# Patient Record
Sex: Male | Born: 1940 | Race: White | Hispanic: No | Marital: Married | Smoking: Never smoker
Health system: Southern US, Community
[De-identification: ages and names within clinical notes are randomized; demographics above are authoritative.]

## PROBLEM LIST (undated history)

## (undated) DIAGNOSIS — D732 Chronic congestive splenomegaly: Secondary | ICD-10-CM

## (undated) DIAGNOSIS — D689 Coagulation defect, unspecified: Secondary | ICD-10-CM

## (undated) DIAGNOSIS — I340 Nonrheumatic mitral (valve) insufficiency: Secondary | ICD-10-CM

## (undated) DIAGNOSIS — Z9289 Personal history of other medical treatment: Secondary | ICD-10-CM

## (undated) DIAGNOSIS — D61818 Other pancytopenia: Secondary | ICD-10-CM

## (undated) DIAGNOSIS — E119 Type 2 diabetes mellitus without complications: Secondary | ICD-10-CM

## (undated) DIAGNOSIS — K7460 Unspecified cirrhosis of liver: Secondary | ICD-10-CM

## (undated) DIAGNOSIS — I6529 Occlusion and stenosis of unspecified carotid artery: Secondary | ICD-10-CM

## (undated) DIAGNOSIS — E785 Hyperlipidemia, unspecified: Secondary | ICD-10-CM

## (undated) DIAGNOSIS — I8500 Esophageal varices without bleeding: Secondary | ICD-10-CM

## (undated) DIAGNOSIS — D693 Immune thrombocytopenic purpura: Secondary | ICD-10-CM

## (undated) DIAGNOSIS — I82409 Acute embolism and thrombosis of unspecified deep veins of unspecified lower extremity: Secondary | ICD-10-CM

## (undated) DIAGNOSIS — R161 Splenomegaly, not elsewhere classified: Secondary | ICD-10-CM

## (undated) DIAGNOSIS — L02419 Cutaneous abscess of limb, unspecified: Secondary | ICD-10-CM

## (undated) DIAGNOSIS — I639 Cerebral infarction, unspecified: Secondary | ICD-10-CM

## (undated) DIAGNOSIS — M1990 Unspecified osteoarthritis, unspecified site: Secondary | ICD-10-CM

## (undated) DIAGNOSIS — K635 Polyp of colon: Secondary | ICD-10-CM

## (undated) DIAGNOSIS — D649 Anemia, unspecified: Secondary | ICD-10-CM

## (undated) DIAGNOSIS — D72819 Decreased white blood cell count, unspecified: Secondary | ICD-10-CM

## (undated) DIAGNOSIS — I10 Essential (primary) hypertension: Secondary | ICD-10-CM

## (undated) DIAGNOSIS — L03119 Cellulitis of unspecified part of limb: Secondary | ICD-10-CM

## (undated) DIAGNOSIS — I81 Portal vein thrombosis: Secondary | ICD-10-CM

## (undated) HISTORY — DX: Immune thrombocytopenic purpura: D69.3

## (undated) HISTORY — DX: Unspecified osteoarthritis, unspecified site: M19.90

## (undated) HISTORY — DX: Esophageal varices without bleeding: I85.00

## (undated) HISTORY — DX: Splenomegaly, not elsewhere classified: R16.1

## (undated) HISTORY — DX: Occlusion and stenosis of unspecified carotid artery: I65.29

## (undated) HISTORY — DX: Coagulation defect, unspecified: D68.9

## (undated) HISTORY — DX: Portal vein thrombosis: I81

## (undated) HISTORY — DX: Anemia, unspecified: D64.9

## (undated) HISTORY — PX: COLONOSCOPY: SHX174

## (undated) HISTORY — DX: Nonrheumatic mitral (valve) insufficiency: I34.0

## (undated) HISTORY — DX: Hyperlipidemia, unspecified: E78.5

## (undated) HISTORY — DX: Chronic congestive splenomegaly: D73.2

## (undated) HISTORY — DX: Cerebral infarction, unspecified: I63.9

## (undated) HISTORY — DX: Other pancytopenia: D61.818

## (undated) HISTORY — DX: Decreased white blood cell count, unspecified: D72.819

## (undated) HISTORY — DX: Polyp of colon: K63.5

---

## 1994-02-18 DIAGNOSIS — K55069 Acute infarction of intestine, part and extent unspecified: Secondary | ICD-10-CM

## 1994-02-18 HISTORY — DX: Acute infarction of intestine, part and extent unspecified: K55.069

## 1996-02-19 DIAGNOSIS — Z9289 Personal history of other medical treatment: Secondary | ICD-10-CM

## 1996-02-19 HISTORY — DX: Personal history of other medical treatment: Z92.89

## 1999-08-23 ENCOUNTER — Inpatient Hospital Stay (HOSPITAL_COMMUNITY): Admission: AD | Admit: 1999-08-23 | Discharge: 1999-08-28 | Payer: Self-pay | Admitting: Internal Medicine

## 1999-08-23 ENCOUNTER — Encounter: Payer: Self-pay | Admitting: Internal Medicine

## 1999-08-24 ENCOUNTER — Encounter: Payer: Self-pay | Admitting: Internal Medicine

## 2006-08-14 ENCOUNTER — Encounter: Admission: RE | Admit: 2006-08-14 | Discharge: 2006-09-03 | Payer: Self-pay | Admitting: Internal Medicine

## 2006-10-01 ENCOUNTER — Encounter: Admission: RE | Admit: 2006-10-01 | Discharge: 2006-10-01 | Payer: Self-pay

## 2006-11-05 ENCOUNTER — Encounter: Admission: RE | Admit: 2006-11-05 | Discharge: 2006-11-05 | Payer: Self-pay | Admitting: Internal Medicine

## 2006-11-12 ENCOUNTER — Encounter: Admission: RE | Admit: 2006-11-12 | Discharge: 2006-11-12 | Payer: Self-pay | Admitting: Internal Medicine

## 2006-11-19 ENCOUNTER — Ambulatory Visit (HOSPITAL_COMMUNITY): Admission: RE | Admit: 2006-11-19 | Discharge: 2006-11-19 | Payer: Self-pay | Admitting: Internal Medicine

## 2006-11-19 ENCOUNTER — Encounter (INDEPENDENT_AMBULATORY_CARE_PROVIDER_SITE_OTHER): Payer: Self-pay | Admitting: Internal Medicine

## 2006-11-25 ENCOUNTER — Encounter: Admission: RE | Admit: 2006-11-25 | Discharge: 2006-11-25 | Payer: Self-pay | Admitting: Internal Medicine

## 2006-12-11 ENCOUNTER — Ambulatory Visit: Payer: Self-pay | Admitting: *Deleted

## 2007-06-25 ENCOUNTER — Ambulatory Visit: Payer: Self-pay | Admitting: *Deleted

## 2007-12-31 ENCOUNTER — Ambulatory Visit: Payer: Self-pay | Admitting: *Deleted

## 2008-06-10 IMAGING — RF DG UGI W/ HIGH DENSITY W/KUB
18 of 22 series · 18 of 24 positions shown · non-contrast
Comparison: none

CLINICAL DATA: Dysphasia

UPPER GI SERIES W/ HIGH-DENSITY BARIUM AND KUB:
TECHNIQUE: After obtaining a scout radiograph, a double-contrast upper GI
series was performed using both high-density and thin barium.

[Series 1: run · 1 of 10 slices shown (1 of 17)]
[im 1/10]
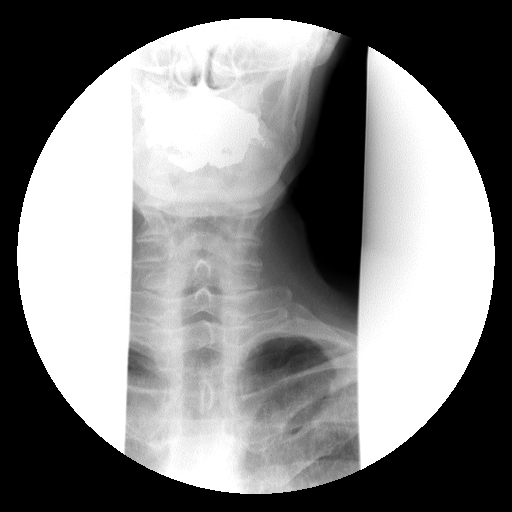

[Series 2: run · 1 of 9 slices shown (2 of 17)]
[im 1/9]
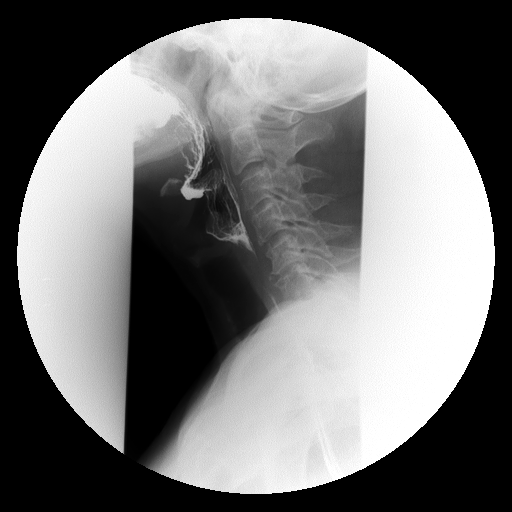

[Series 3: run · 1 of 4 slices shown (3 of 17)]
[im 1/4]
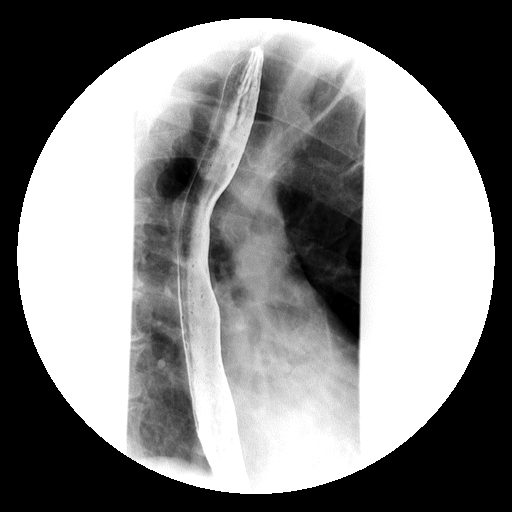

[Series 4: run · 1 of 9 slices shown (4 of 17)]
[im 1/9]
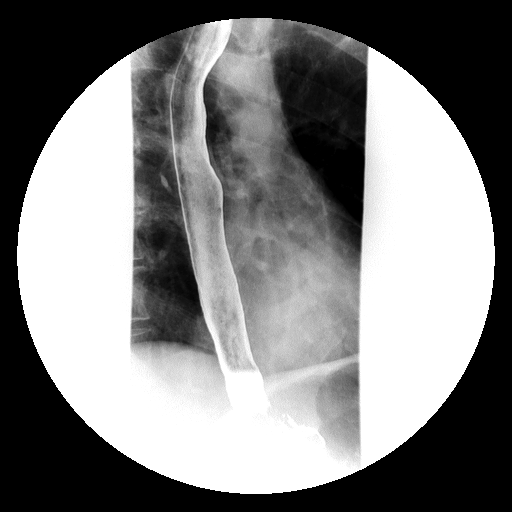

[Series 5: run · 1 of 1 slices shown (5 of 17)]
[im 1/1]
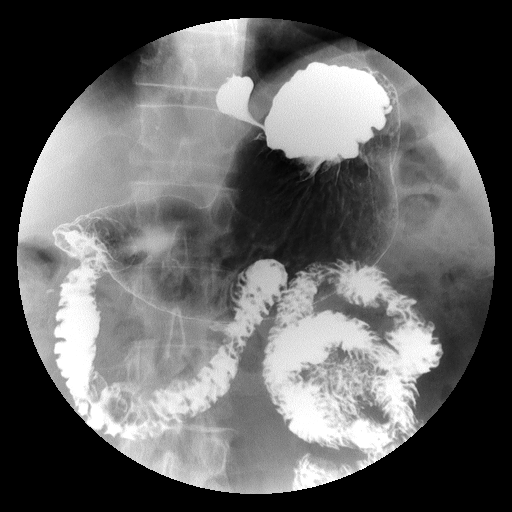

[Series 6: run · 1 of 1 slices shown (6 of 17)]
[im 1/1]
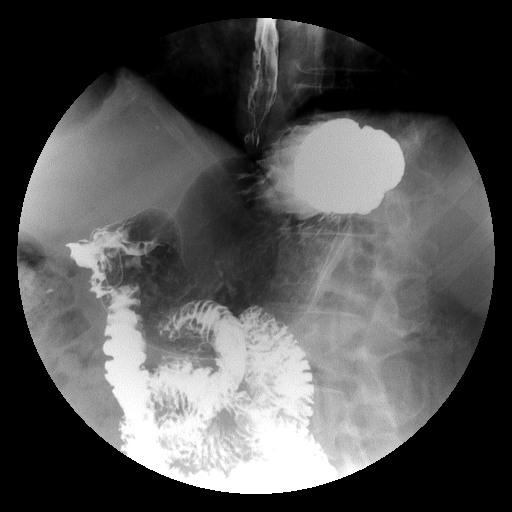

[Series 7: run · 1 of 1 slices shown (7 of 17)]
[im 1/1]
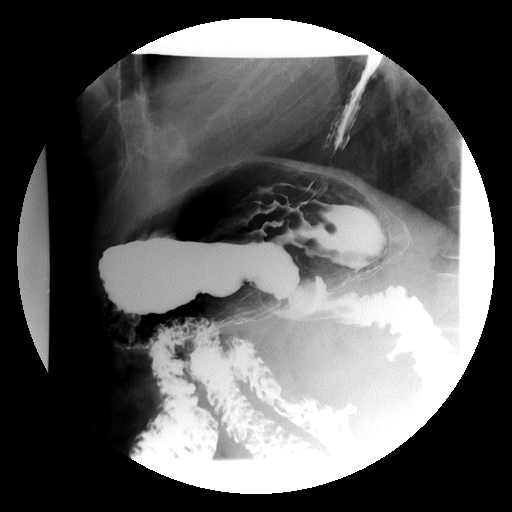

[Series 9: run · 1 of 1 slices shown (8 of 17)]
[im 1/1]
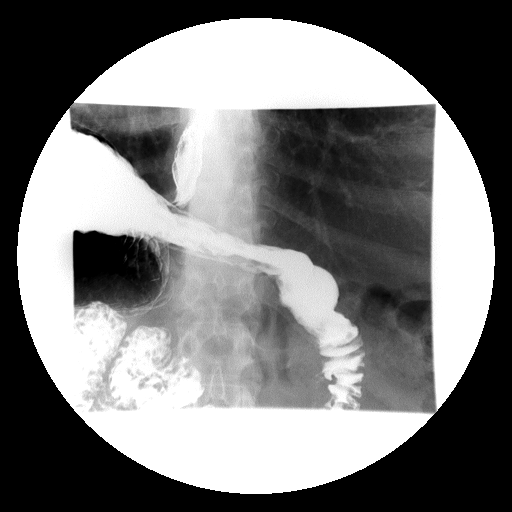

[Series 10: run · 1 of 1 slices shown (9 of 17)]
[im 1/1]
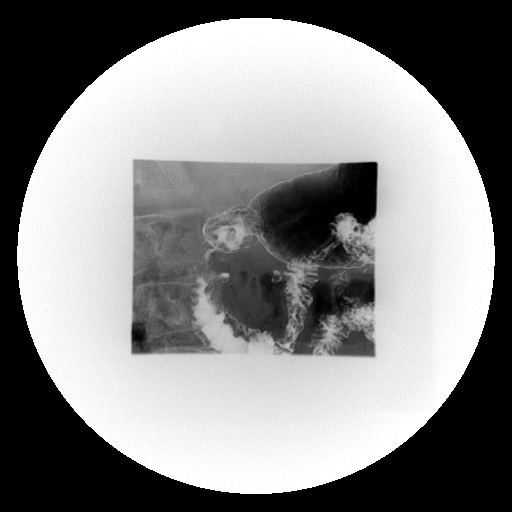

[Series 11: run · 1 of 1 slices shown (10 of 17)]
[im 1/1]
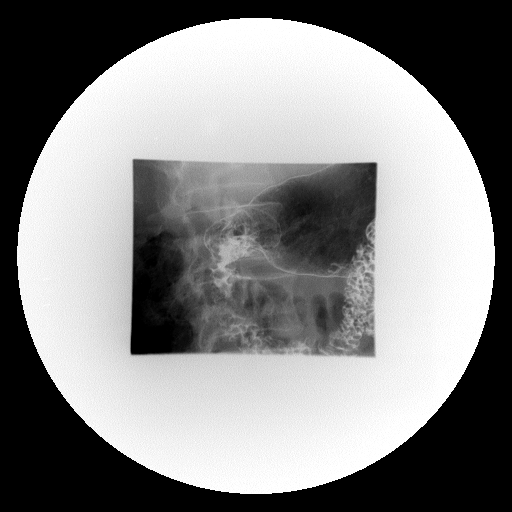

[Series 13: run · 1 of 1 slices shown (11 of 17)]
[im 1/1]
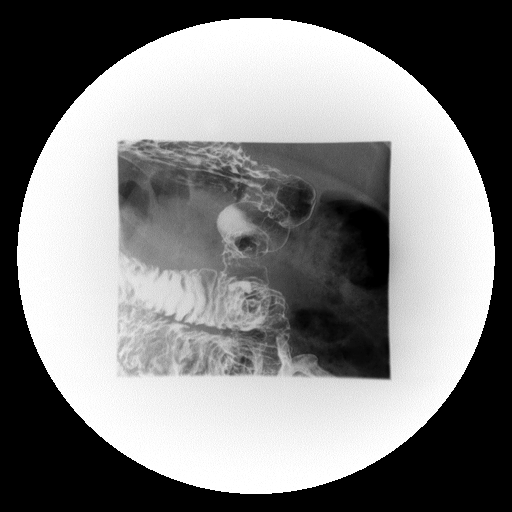

[Series 14: run · 1 of 1 slices shown (12 of 17)]
[im 1/1]
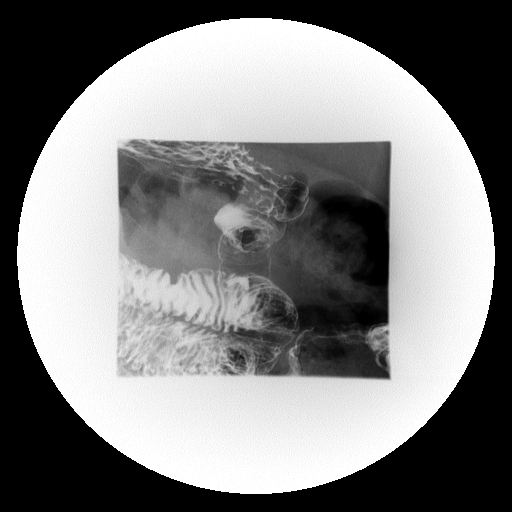

[Series 15: run · 1 of 1 slices shown (13 of 17)]
[im 1/1]
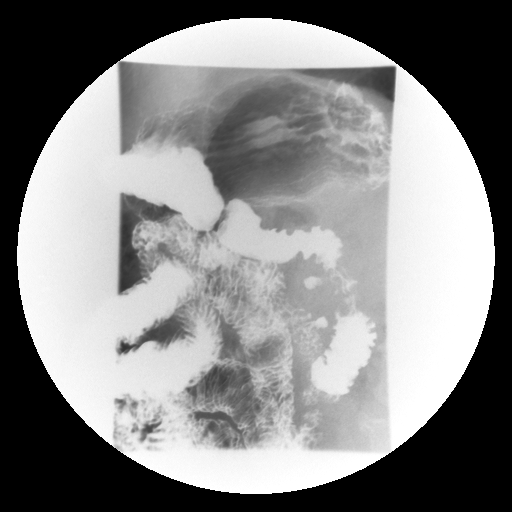

[Series 17: run · 1 of 1 slices shown (14 of 17)]
[im 1/1]
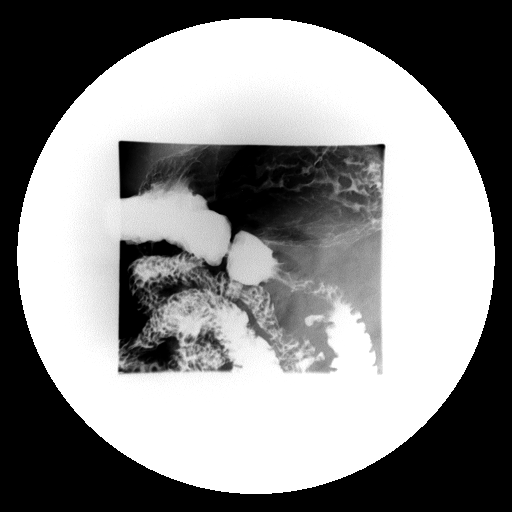

[Series 18: run · 1 of 1 slices shown (15 of 17)]
[im 1/1]
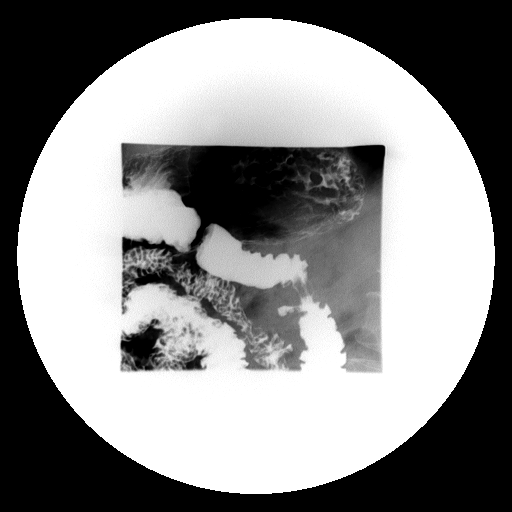

[Series 19: run · 1 of 1 slices shown (16 of 17)]
[im 1/1]
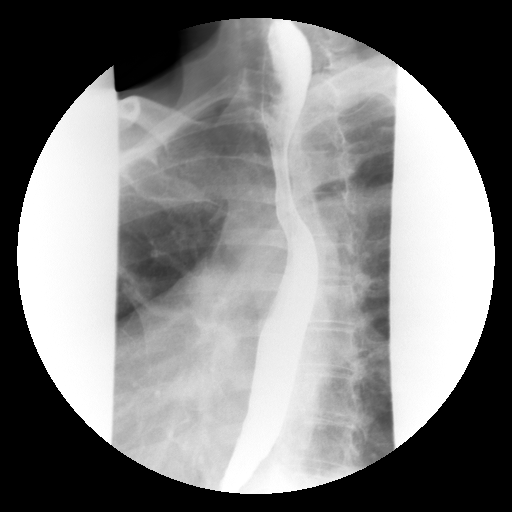

[Series 21: run · 1 of 1 slices shown (17 of 17)]
[im 1/1]
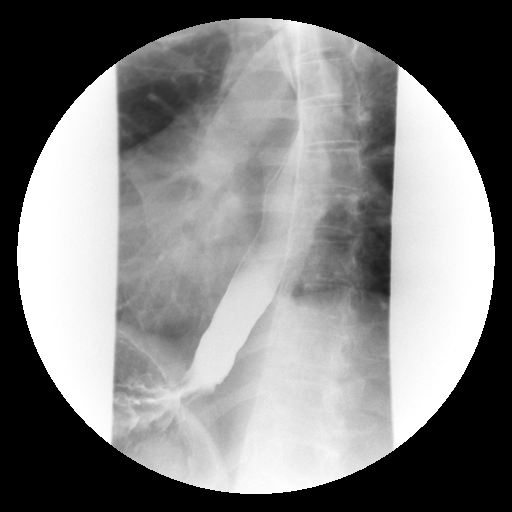

[Series 1001: view not recorded · 0.20mm/px · 1 of 1 slices shown]
[im 1/1]
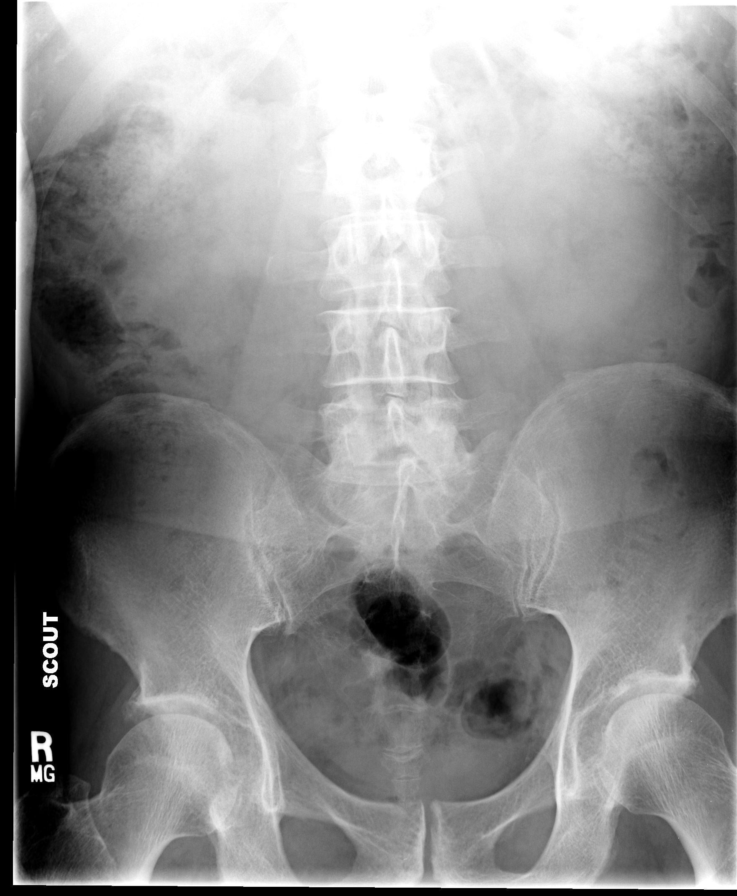

[18 of 24 positions shown; findings below may reference images not displayed]

FINDINGS: Preprocedure scout KUB shows a nonspecific bowel gas pattern with no
acute or significant findings.

Frontal and lateral views of the hypopharynx while swallowing are normal.
Double-contrast imaging of the esophagus shows no evidence for mucosal
ulceration, stricture, mass-effect, or diverticulum. Primary peristalsis is
disrupted on numerous swallows, consistent with nonspecific esophageal motility
disorder.

Double contrast imaging of the stomach is unremarkable.  The pylorus and
duodenal bulb are normal.  Duodenal C-loop is in the expected anatomic location.

The patient was given a 13 mm barium tablet at the end of the exam which passes
readily into the stomach when taken with water.
IMPRESSION: Nonspecific esophageal motility disorder. Otherwise unremarkable exam.

## 2008-06-29 ENCOUNTER — Ambulatory Visit (HOSPITAL_COMMUNITY): Admission: RE | Admit: 2008-06-29 | Discharge: 2008-06-29 | Payer: Self-pay | Admitting: *Deleted

## 2008-06-29 ENCOUNTER — Encounter (INDEPENDENT_AMBULATORY_CARE_PROVIDER_SITE_OTHER): Payer: Self-pay | Admitting: *Deleted

## 2008-06-30 ENCOUNTER — Ambulatory Visit: Payer: Self-pay | Admitting: *Deleted

## 2008-11-24 ENCOUNTER — Encounter: Admission: RE | Admit: 2008-11-24 | Discharge: 2008-11-24 | Payer: Self-pay | Admitting: Internal Medicine

## 2008-12-23 ENCOUNTER — Ambulatory Visit: Payer: Self-pay | Admitting: Vascular Surgery

## 2009-06-22 ENCOUNTER — Ambulatory Visit: Payer: Self-pay | Admitting: Vascular Surgery

## 2010-02-08 ENCOUNTER — Ambulatory Visit: Payer: Self-pay | Admitting: Vascular Surgery

## 2010-07-03 NOTE — Procedures (Signed)
CAROTID DUPLEX EXAM   INDICATION:  Follow up carotid artery disease.   HISTORY:  Diabetes:  Borderline.  Cardiac:  No.  Hypertension:  Yes.  Smoking:  No.  Previous Surgery:  No.  CV History:  TIA in 2008.  Amaurosis Fugax No, Paresthesias No, Hemiparesis No.                                       RIGHT             LEFT  Brachial systolic pressure:         128               130  Brachial Doppler waveforms:         Biphasic          Biphasic  Vertebral direction of flow:        Antegrade         Antegrade  DUPLEX VELOCITIES (cm/sec)  CCA peak systolic                   109               343  ECA peak systolic                   130               568  ICA peak systolic                   230               616  ICA end diastolic                   58                35  PLAQUE MORPHOLOGY:                  Calcified         Calcified  PLAQUE AMOUNT:                      Moderate/severe   Mild  PLAQUE LOCATION:                    ICA/bifurcation   Bifurcation/ICA   IMPRESSION:  1. Right internal carotid artery shows evidence of 60-79% stenosis.  2. Left internal carotid artery shows evidence of 1-39% stenosis.  3. No significant changes from previous study.       ___________________________________________  P. Drucie Opitz, M.D.   AS/MEDQ  D:  12/31/2007  T:  12/31/2007  Job:  [id number]

## 2010-07-03 NOTE — Procedures (Signed)
CAROTID DUPLEX EXAM   INDICATION:  Followup carotid artery disease.   HISTORY:  Diabetes:  No.  Cardiac:  No.  Hypertension:  Yes.  Smoking:  No.  Previous Surgery:  No.  CV History:  TIA in 2008, currently asymptomatic.  Amaurosis Fugax No, Paresthesias No, Hemiparesis No                                       RIGHT             LEFT  Brachial systolic pressure:         158               152  Brachial Doppler waveforms:         WNL               WNL  Vertebral direction of flow:        Antegrade         Antegrade  DUPLEX VELOCITIES (cm/sec)  CCA peak systolic                   120               878  ECA peak systolic                   202               676  ICA peak systolic                   239               720  ICA end diastolic                   55                42  PLAQUE MORPHOLOGY:                  Heterogeneous     Heterogeneous  PLAQUE AMOUNT:                      Moderate          Mild  PLAQUE LOCATION:                    ICA and ECA       ICA   IMPRESSION:  60% to 79% stenosis of the right internal carotid artery.  40% to 59% stenosis in the left internal carotid artery secondary to  vessel tortuosity.  Bilateral intimal thickening within the common  carotid arteries.  Bilateral external carotid artery stenosis.  Study  stable compared to previous.     ___________________________________________  Judeth Cornfield. Scot Dock, M.D.   OD/MEDQ  D:  03/05/2010  T:  03/05/2010  Job:  [id number]

## 2010-07-03 NOTE — Op Note (Signed)
NAME:  [name], [name]              ACCOUNT NO.:  [id number]   MEDICAL RECORD NO.:  [id number]          PATIENT TYPE:  AMB   LOCATION:  ENDO                         FACILITY:  [facility]   PHYSICIAN:  Waverly Ferrari, M.D.    DATE OF BIRTH:  Apr 16, 1940   DATE OF PROCEDURE:  DATE OF DISCHARGE:                               OPERATIVE REPORT   PROCEDURE:  Colonoscopy.   INDICATIONS:  Colon cancer screening.   ANESTHESIA:  Fentanyl 75 mcg and Versed 8 mg.   PROCEDURE:  With the patient mildly sedated in the left lateral  decubitus position the Pentax videoscopic pediatric colonoscope was  inserted in the rectum after a normal rectal exam and passed under  direct vision with pressure applied to reach the cecum identified by  ileocecal valve and base of cecum.  In the cecum was multiple little  erosions that were photographed and biopsies were taken.  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  Some were photographed.  There was also a  small polyp just one fold removed from the ileocecal valve.  This was  photographed and it was removed using snare cautery technique at setting  of 20/150 blended current.  Unfortunately, the polyp was lost into  brownish material and it was a small polyp and was not found.  Therefore, after searching I elected to just withdraw the colonoscope  taking circumferential views of the remaining colonic mucosa stopping  only then in the rectum which appeared normal on direct and showed  hemorrhoids on retroflexed view.  The endoscope was straightened and  withdrawn.  The patient's vital signs and pulse oximeter remained  stable.  The patient tolerated the procedure well without apparent  complications.   FINDINGS:  Internal hemorrhoids, multiple arteriovenous malformations of  right colon, aphthous ulceration of cecum, especially in the cecal cap  and small polyp of the cecum removed.   PLAN:  Await  biopsy report.  The patient will call me for results and  follow-up with me as an outpatient.           ______________________________  Waverly Ferrari, M.D.     GMO/MEDQ  D:  06/29/2008  T:  06/29/2008  Job:  [id number]

## 2010-07-03 NOTE — Procedures (Signed)
CAROTID DUPLEX EXAM   INDICATION:  Followup of known carotid artery disease.  The patient is  asymptomatic.   HISTORY:  Diabetes:  Borderline.  Cardiac:  No.  Hypertension:  Yes.  Smoking:  No.  Previous Surgery:  No.  CV History:  TIA in 2008.  Amaurosis Fugax No, Paresthesias No, Hemiparesis No                                       RIGHT             LEFT  Brachial systolic pressure:         142               138  Brachial Doppler waveforms:         WNL               WNL  Vertebral direction of flow:        Antegrade         Antegrade  DUPLEX VELOCITIES (cm/sec)  CCA peak systolic                   105               96  ECA peak systolic                   146               259  ICA peak systolic                   271               83  ICA end diastolic                   76                29  PLAQUE MORPHOLOGY:                  Calcific with shadowing             Calcific  PLAQUE AMOUNT:                      Moderate - severe Mild  PLAQUE LOCATION:                    Bifurcation, ICA  ICA   IMPRESSION:  1. Right 60-79% ICA stenosis.  2. Left 1-39% ICA stenosis.  3. Patent ECAs.  4. Bilateral antegrade flow in vertebral arteries.   ___________________________________________  P. Drucie Opitz, M.D.   PB/MEDQ  D:  06/25/2007  T:  06/26/2007  Job:  [id number]

## 2010-07-03 NOTE — Procedures (Signed)
CAROTID DUPLEX EXAM   INDICATION:  Followup of known carotid artery disease.   HISTORY:  Diabetes:  Borderline.  Cardiac:  No.  Hypertension:  No.  Smoking:  No.  Previous Surgery:  No.  CV History:  TIA in 2008.  Amaurosis Fugax No, Paresthesias No, Hemiparesis No                                       RIGHT             LEFT  Brachial systolic pressure:         134               150  Brachial Doppler waveforms:         WNL               WNL  Vertebral direction of flow:        Antegrade         Antegrade  DUPLEX VELOCITIES (cm/sec)  CCA peak systolic                   155               056  ECA peak systolic                   180               979  ICA peak systolic                   285               480  ICA end diastolic                   80                22  PLAQUE MORPHOLOGY:                  Calcified         Calcified  PLAQUE AMOUNT:                      Moderate to severe                  Mild  PLAQUE LOCATION:                    Bif/ICA           Bif/ICA   IMPRESSION:  1. High end 60-79% right internal carotid artery stenosis.  2. 40-59% left internal carotid artery stenosis.       ___________________________________________  P. Drucie Opitz, M.D.   AC/MEDQ  D:  06/30/2008  T:  06/30/2008  Job:  [id number]

## 2010-07-03 NOTE — Assessment & Plan Note (Signed)
OFFICE VISIT   [name], [name]  DOB:  [birth year]                                       06/22/2009  UXLKG#:[id number]   I saw the patient in the office today for continued followup of his  carotid disease.  This patient had previously been followed by Dr.  Amedeo [name].  This is a pleasant 70 year old right-handed gentleman who has  had a moderate right carotid stenosis.  On his most recent followup  duplex scan in November 2010 he had a 60%-79% right carotid stenosis  with a 40%-59% left carotid stenosis.  He is asymptomatic.  He comes in  for a 42-monthfollowup visit.   He had a previous left occipital lobe infarct based on Dr. HDoristine Locksnote  from October 2008.  Since that time period, he has had no further  history of stroke, TIAs, expressive or receptive aphasia or amaurosis  fugax.   PAST MEDICAL HISTORY:  Significant for adult-onset diabetes.  He does  not require insulin.  He denies any history of hypertension, history of  previous myocardial infarction, history of congestive heart failure,  history of COPD.  He does have a history of hypercholesterolemia, which  is well-controlled on his Lipitor.  His diabetes has also been under  good control.   FAMILY HISTORY:  There is no history of premature cardiovascular  disease.   SOCIAL HISTORY:  He is married.  He has 2 children.  He des not use  tobacco.   REVIEW OF SYSTEMS:  CARDIOVASCULAR:  He had no chest pain, chest  pressure, palpitations or arrhythmias.  He has had no claudication, rest  pain or nonhealing ulcers.  He has had no history of DVT or phlebitis.  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  PULMONARY:  He has had no productive cough, bronchitis, asthma or  wheezing.   PHYSICAL EXAMINATION:  This is a pleasant 70year old gentleman who  appears his stated age.  Blood pressure is 171/68, heart rate is 70,  saturation is 100%.   HEENT:  Unremarkable.  Lungs are clear bilaterally  to auscultation without rales, rhonchi or wheezing.  On cardiovascular  exam, he has a right carotid bruit.  He has a regular rate and rhythm  without murmur.  He has palpable femoral pulses and well-perfused feet  without ischemic ulcers.  Abdomen is soft and nontender with normal-  pitched bowel sounds.  Neurologic exam:  He has no focal weakness or  paresthesias.  Skin:  There are no ulcers or rashes.   I have independently interpreted his carotid duplex scan, which shows a  60%-79% right carotid stenosis with no significant stenosis on the left.  The velocities are actually lower compared to the study 6 months ago.  I  have explained we typically would not consider right carotid  endarterectomy unless the stenosis progressed to greater than 80% range  or he developed new neurologic symptoms.  I have ordered a followup  duplex scan in 6 months and I will see him back at that time.  He knows  to call sooner if he has problems.  In the meantime he knows to continue  taking his aspirin.     CJudeth [name] DScot [name] M.D.  Electronically Signed   CSD/MEDQ  D:  06/22/2009  T:  06/23/2009  Job:  [id number]

## 2010-07-03 NOTE — Procedures (Signed)
CAROTID DUPLEX EXAM   INDICATION:  Carotid disease.   HISTORY:  Diabetes:  No.  Cardiac:  No.  Hypertension:  Yes.  Smoking:  No.  Previous Surgery:  No.  CV History:  TIA in 2008, currently asymptomatic.  Amaurosis Fugax No, Paresthesias No, Hemiparesis No                                       RIGHT             LEFT  Brachial systolic pressure:         156               154  Brachial Doppler waveforms:         Normal            Normal  Vertebral direction of flow:        Antegrade         Antegrade  DUPLEX VELOCITIES (cm/sec)  CCA peak systolic                   97                387  ECA peak systolic                   171               564  ICA peak systolic                   264               95  ICA end diastolic                   58                25  PLAQUE MORPHOLOGY:                  Mixed             Mixed  PLAQUE AMOUNT:                      Moderate          Mild  PLAQUE LOCATION:                    ICA               ICA   IMPRESSION:  1. 60%-79% stenosis of the right internal carotid artery.  2. 1%-39% stenosis of the left internal carotid artery.  3. No significant change noted when compared to the previous exam on      12/23/2008.   ___________________________________________  Judeth Cornfield. Scot Dock, M.D.   CH/MEDQ  D:  06/23/2009  T:  06/24/2009  Job:  [id number]

## 2010-07-03 NOTE — Consult Note (Signed)
VASCULAR SURGERY CONSULTATION   [name], [name]  DOB:  [birth year]                                       12/11/2006  BTDHR#:[id number]   REASON FOR CONSULTATION:  Left occipital lobe infarction (CVA).   HISTORY:  Patient is a 70 year old gentleman referred for evaluation of  carotid artery disease and a history of stroke.   He describes an initial onset of unusual symptoms in May or June of this  year with diffuse joint discomfort.  This was then followed in August by  the development of an unsteadiness of gait, dry mouth, and loss of  taste.  He lost about 40 pounds of weight over 3-4 months.  He was  having difficulty eating due to his symptoms of dry mouth and poor  taste.  He noted general blurring of his vision.  His dizziness has  improved over the past 3-4 weeks.  Joint discomfort resolved.  He has  gained back about 12 pounds of weight.  His vision is still somewhat  blurred in both eyes.  Denies monocular blindness.  No focal sensory,  motor, or visual deficit.  Denies speech problems.   CT scan performed in mid September revealed hypodensity of the left  occipital lobe, suspicious for acute infarct.  Infarection was verified  by MRI.  Aspirin therapy was initiated.   Carotid Doppler evaluation, along with 2D echo, were carried out.  Carotid Doppler reveals a 60-79% right ICA stenosis.  Left ICA reveals  minimal disease.   A 2D echo revealed good LV function, mild-to-moderate mitral valve  thickening, and mild mitral regurgitation.  No significant cardiac  source of emboli was identified.  Patent foramen ovale could not be  excluded.   Patient denies any clumsiness, poor coordination, stumbling, or syncope.   PAST MEDICAL HISTORY:  1. Hyperlipidemia.  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  4. Elevated liver function studies.  5. Hypertension.  6. Duodenal  diverticulum.  7. Type 2 diabetes.  8. Polymyalgia rheumatica.   MEDICATIONS:  1. Metformin/HCL 500 mg t.i.d.  2. Glipizide 5 mg t.i.d.  3. Lipitor 10 mg daily.  4. Omega fish oil 1000 mg b.i.d.  5. Aspirin 81 mg daily.  6. Vitamin C 1000 mg daily.  7. Vitamin D3 1000 mg daily.   ALLERGIES:  None known.   FAMILY HISTORY:  Father died at age 6.  He had a history of pacemaker  insertion.  Mother died at age 43 with a history of Alzheimer's disease.  He has two brothers who are living.  One sister deceased at age 41 with  a history of cancer and diabetes.  No family history of stroke or  coronary artery disease.   SOCIAL HISTORY:  The patient is married with two grown children.  He is  retired.  Does not currently smoke or use tobacco products.  Denies  alcohol use.   REVIEW OF SYSTEMS:  Recent weight loss is noted in history.  Appetite is  now better.  No fevers or chills.  No cardiac or pulmonary complaints.  He did have some trouble swallowing around the time of his stroke, but  this is now improved.  No change in bowel habits.  No dysuria or  frequency.  History of mesenteric thrombosis, as noted.  Denies recent  joint discomfort.  No depression.  No change in vision or hearing.  No  bleeding problems.  Chronic thrombocytopenia present.   PHYSICAL EXAMINATION:  A 70 year old gentleman alert and oriented.  No  distress.  No cyanosis, pallor, or jaundice.  Vital signs:  BP 138/60.  Pulse is 75 per minute.  Respirations 18 per minute.  HEENT:  Extraocular movements are intact.  Pupils are equal and reactive.  Normal hearing.  Mouth and throat are clear.  Neck:  Supple.  No  thyromegaly or adenopathy.  Cardiovascular:  No carotid bruits.  Heart  sounds are normal without murmurs.  No extra sounds, gallops, or rubs.  Femoral pulses 2+ bilaterally.  Respiratory:  Equal air entry  bilaterally.  No rales or rhonchi.  Normal percussion.  GI:  Normal  bowel sounds.  No abdominal  bruits.  Abdomen:  Nontender.  No masses or  organomegaly.  Neurologic:  Cranial nerves intact.  Strength equal  bilaterally.  Normal reflexes at 1+ bilaterally.  Musculoskeletal:  No  joint deformity.  Full range of motion of the joints.   IMPRESSION:  1. Left occipital lobe cerebrovascular accident.  2. Asymptomatic moderate right internal carotid artery stenosis.  3. Hyperlipidemia.  4. Thrombocytopenia.  5. Hypertension.  6. Type 2 diabetes.  7. Polymyalgia rheumatica.   MEDICAL DECISION MAKING:  Patient has a history of nondisabling left  occipital stroke, embolic source.  Workup for this is negative at this  time, including no significant left carotid disease or potential cardiac  source of emboli identified.  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  Recommend at this time continued  antiplatelet agent with low dose aspirin.  Follow up in six months with  a carotid Doppler evaluation.   Dorothea Glassman, M.D.  Electronically Signed  PGH/MEDQ  D:  12/11/2006  T:  12/13/2006  Job:  [id number]   cc:   Arlyss Repress, MD

## 2010-07-03 NOTE — Procedures (Signed)
CAROTID DUPLEX EXAM   INDICATION:  Followup of carotid disease.   HISTORY:  Diabetes:  No.  Cardiac:  No.  Hypertension:  Yes.  Smoking:  No.  Previous Surgery:  No.  CV History:  TIA in 2008.  Amaurosis Fugax No, Paresthesias No, Hemiparesis No                                       RIGHT             LEFT  Brachial systolic pressure:         140               135  Brachial Doppler waveforms:         Triphasic         Triphasic  Vertebral direction of flow:        Antegrade         Antegrade  DUPLEX VELOCITIES (cm/sec)  CCA peak systolic                   143               607  ECA peak systolic                   143               371  ICA peak systolic                   306               062  ICA end diastolic                   101               69  PLAQUE MORPHOLOGY:                  Mixed             Mixed  PLAQUE AMOUNT:                      Severe            Moderate  PLAQUE LOCATION:                    Bulb, ICA         ICA   IMPRESSION:  1. High-grade 60%-79% stenosis of the right internal carotid artery.  2. 40%-59% stenosis noted in the left internal carotid artery.        ___________________________________________  Judeth Cornfield. Scot Dock, M.D.   CJ/MEDQ  D:  12/23/2008  T:  12/23/2008  Job:  [id number]

## 2010-07-06 NOTE — Discharge Summary (Signed)
[place]  Patient:    [name], [name]                     MRN: [id number] Adm. Date:  [id number] Disc. Date: 08/28/99 Attending:  Emi Belfast CC:         Laurice Record. Aplington, M.D.             Lowell C. Pearlie Oyster, M.D.                           Discharge Summary  FINAL DIAGNOSES: 1. Cellulitis of the suprapatellar and patellar areas of the left knee. 2. Mild contracture secondary to edema and induration. 3. Idiopathic thrombocytopenic purpura. 4. Hypercholesterolemia.  HISTORY OF PRESENT ILLNESS:  This 70 year old white male, employed by the [employer] in the [employer], has known mild idiopathic thrombocytopenic purpura and is followed by Dr. Starr Sinclair of [facility].  His platelet counts have ranged in the 50-75,000 range for the last several years and have not required any specific interventions; specifically, no steroids, no splenectomy, or any immunosuppressant-type treatments.  The patient presented to my office with an inflamed, tender left knee area, and it was unclear if he had evidence for a knee effusion or a septic joint.  He may have had some trauma with kneeling, doing some painting at his church, but it was unclear because of low-grade fever of 99.3 and the erythema, along with his platelet deficiency, and it was felt that he should be admitted for intravenous antibiotics.  HOSPITAL COURSE:  The patient was placed on a general floor and seen by orthopedist, Dr. Tarri Glenn, at my request.  Dr. Shellia Carwin did concur with the diagnosis of probable cellulitis.  The patient did undergo both plain x-rays and MRI of the knee.  The MRI was read as showing only evidence of soft tissue disease, but nothing within the joint space and no clear-cut internal derangements of the knee.  There was no evidence of osteomyelitis.  He was also seen in consultation by Dr. Starr Sinclair for  hematology/oncology service. After consideration of the risks and benefits, it was felt that the patient would benefit with Lovenox subcutaneous for prophylaxis against DVT since the patient would be at bed rest for several days, and he had a significant amount of edema of the left knee and popliteal area.  The patient had undergone an outpatient venous Doppler prior to admission, and there was no evidence of DVT at the time of admission.  Nonetheless, the patient had another venous Doppler on the second hospital day and this, too, was unremarkable for DVT.  The Lovenox was then discontinued.  Physical therapy was instituted to assist the patient in regaining full mobility and reduce the tendency for a flexion contracture, which was starting to develop.  By the morning of the fifth day, it was felt that the patient was sufficiently improved and had responded reasonably well to the intravenous cefazolin that he could be safely discharged home and converted to oral antibiotics.  The patient was seen by physical therapist, given some instructions on gaining strength and function in the knee, and on the morning of August 28, 1999, the patient was felt to be at maximal hospital benefit and was discharged home.  LABORATORY DATA:  The patients CBCs, and especially his platelet counts, were followed during his stay.  His white count was initially elevated  at 10,900, but this later dropped to 6400.  His hemoglobin remained stable in the 14-gram range.  Platelet counts ranged initially low at 42,000 and later was up to 76,000 by August 27, 1999.  His PT and PTT were within normal limits.  His fibrinogen was markedly elevated at 712.  His d-dimer was elevated at 1.26. His glucose was slightly elevated at 174 with intravenous fluids running. Serum albumin slightly low at 3.2.  His uric acid was normal at 4.7.  His blood was typed, he was A positive with negative antibody screen.  He had blood cultures  drawn, and there was no growth at the time of this dictation.  The MRI and x-rays of the knee were as described above.  The official report is not yet in the chart.  CONSULTATIONS: 1. Dr. Starr Sinclair, hematology/oncology. 2. Dr. Tarri Glenn, orthopedic service.  NOSOCOMIAL INFECTIONS:  None.  TRANSFUSIONS:  None.  THERAPIES:  Physical therapy.  PROCEDURES: 1. MRI and x-rays of the knee. 2. Venous Doppler study of the affected lower extremity.  DISCHARGE INSTRUCTIONS:  The patient was advised to take his Keflex 500 mg twice a day for an additional 10 days.  Resume his Lescol 80 mg after supper every evening.  FOLLOW-UP:  He would see me in the office in approximately 10-14 days for reassessment and fasting blood work to include his lipid panel and SGPT.  He would see Dr. Shellia Carwin in about three weeks to reassess his knee function and see if further specific orthopedic interventions were required.  CONDITION ON DISCHARGE:  Improving.DD:  08/28/99 TD:  08/28/99 Job: [id number]

## 2010-07-06 NOTE — H&P (Signed)
[place]. [facility]  Patient:    [name], [name]                     MRN: [id number] Adm. Date:  [id number] Attending:  Emi Belfast CC:         Laurice Record. Aplington, M.D.             Lowell C. Pearlie Oyster, M.D.                         History and Physical  CHIEF COMPLAINT:  "My left knee is real painful and swollen."  HISTORY OF PRESENT ILLNESS:  This 70 year old white male has known mild idiopathic thrombocytopenic purpura (ITP) and is regularly followed by hematologist, Dr. Sharen Heck C. Shinn of [employer].  He saw Dr. Pearlie Oyster last in January of this year and his platelet count, which has been averaging between 50,000 and 75,000, was at 73,000.  He was not due to see Dr. Pearlie Oyster again for another five months.  This past week, the patient was doing painting on a baseboard at his church.  He was kneeling down for quite a long time.  He did not have any obvious injury or laceration.  He had no discomfort until about two days later, he noted some mild pain when he first got up and two days ago, he noted significant pain when trying to get out of a chair.  He noted a lot of swelling and heat to the knee.  He felt rather hot but denied any obvious fevers or chills.  His knee and leg below the knee had become quite swollen.  He had a little bit of trouble straightening the leg and had to use a cane to ambulate.  He had no insect bites.  He did have a minor superficial skin trauma to the surface of the knee cap many, many years ago and left him with some scarring.  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  He had an extensive workup at that time to see if there was any coagulopathy but none was found.  He was placed on Coumadin for a number of months and subsequently this was discontinued.  He has a history of idiopathic thrombocytopenic purpura, as described above, and he also has a history  of splenomegaly.  FAMILY HISTORY:  His parents had no specific health problems.  His mother had perhaps an arrhythmia.  He has a sister and two brothers.  His wife is in good health.  He has two children -- two sons -- in good health.  A maternal uncle died of cancer, possibly of the gallbladder, and maternal aunt had some kind of GI cancer.  His maternal grandmother died about age 70.  There is a history of hypertension in the family.  MEDICATIONS:  Most recently, he has been taking fish oil capsules twice daily, Lescol XR 80 mg daily and a multivitamin daily.  REVIEW OF SYSTEMS:  HEENT:  Denies any visual or hearing problems.  No headaches.  No swallowing difficulties.  RESPIRATORY:  No shortness of breath or chest pain.  No cough.  CARDIOVASCULAR:  Denies any palpitations, chest pain or tightness in the chest with exertion.  GI:  He has had the mesenteric vein thrombosis as described in 1996.  He also has had a history of splenomegaly.  GU:  Denies any dysuria or hematuria.  No history  of kidney stones.  MUSCULOSKELETAL:  He has had no previous muscle or joint problems. No fractures.  HEMATOLOGIC:  He does have ITP, as described, and has not been on any treatment.  His platelet count has generally ranged between 50,000 and 75,000.  ENDOCRINOLOGIC:  No significant diabetes has been uncovered and no thyroid disorders.  NEUROLOGIC:  Denies any seizures, syncope or numbness, tingling or alterations in consciousness.  PHYSICAL EXAMINATION:  VITAL SIGNS:  On presentation to the hospital, his blood pressure was recorded at 132/80, pulse of 112, respirations 20, temperature 97.5 degrees.  GENERAL:  General examination notes a well-developed, well-nourished white male.  He appears in moderate distress, complaining of some knee pain.  HEENT:  Pupils equal, round and reactive to light and accommodation. Extraocular motions are intact.  No signs of scleral icterus.  Conjunctivae are normal  without pallor.  Ear canals are patent.  There is some moderate hair on the tragus.  Skull shows no signs of trauma.  His oral cavity shows membranes to be pink and moist.  No oral lesions and specifically no evidence of hemorrhagic processes.  His gums and teeth are in good repair.  NECK:  His neck is very supple.  There is no thyromegaly or adenopathy.  LUNGS:  Clear.  HEART:  Regular rhythm, without murmurs, gallops or rubs.  ABDOMEN:  His abdomen is soft and nontender.  There is no organomegaly palpable.  GENITAL:  Deferred at this time.  RECTAL:  Deferred at this time.  EXTREMITIES:  The right leg is completely unremarkable.  Pulses are intact. There is no edema.  Left leg shows erythema, moderate heat to the knee and calf area, some erythema on the medial side of the left leg.  The prepatellar bursa is somewhat taut and tender to palpation.  There is some scarring on the surface of the knee but no signs of excoriation or skin breakdown.  It is very painful for him to move the knee and he holds it in a minimally flexed position when he is walking and lying on the bed.  The edema extends all the way to the ankle and it is pitting.  Homans sign is questionably positive with some discomfort referred to the back of the knee.  NEUROLOGIC:  Grossly intact.  There is no focal deficit evident and mentation is normal.  CLINICAL DATA:  An urgent venous Doppler study in the office showed that the venous system is intact with blood flow evident by color Doppler analysis.  IMPRESSION: 1. Acute left knee pain and swelling, rule out septic knee, rule out    cellulitis, rule out bursitis with cellulitis, rule out acute gouty    arthritis, rule out popliteal deep venous thrombosis with subsequent edema. 2. History of mesenteric vein thrombosis of uncertain etiology with a negative    coagulopathy workup. 3. History of idiopathic thrombocytopenic purpura with normally low platelets     ranging around 50,000 to 75,000. 4. Hypercholesterolemia, on treatment.   PLAN:  Because of the patients history of mesenteric vein thrombosis, i.e., hypercoagulable state of unknown etiology, and the strong possibility that this is related to this thrombosis process and that he may have a septic knee, I am admitting him to the hospital.  It is my professional opinion that this should not be treated blindly as an outpatient but requires constant monitoring in the hospital setting.  He will need plain x-rays and an MRI of the knee and I have asked Dr. Jeneen Rinks  P. Aplington to see him and he has agreed to do so.  He will have CBC, uric acid and a comprehensive metabolic panel. If his platelets become a problem during the hospitalization, I will ask Dr. Starr Sinclair to see him, as he has followed him for this hematologic process.  Condition at this time is fair. DD:  08/23/99 TD:  08/24/99 Job: [id number]

## 2010-07-06 NOTE — Consult Note (Signed)
[place]. [facility]  Patient:    [name], [name]                     MRN: [id number] Proc. Date: 08/24/99 Adm. Date:  [id number] Attending:  Emi Belfast CC:         Theodoro Parma Francina Ames., M.D.             James P. Aplington, M.D.             Lowell C. Pearlie Oyster, M.D.                          Consultation Report  SUMMARY:  The patient is a 70 year old male patient I follow for chronic thrombocytopenia with platelet count normally in the 75,000-100,000 range.  He has not required treatment for this and has not needed any major procedures that would require higher platelet count.  In reviewing his history, there is no definite trauma but his left knee over the past two or three days became swollen, red, and tender.  He has been using it more and had been on the knee more.  Doppler studies in Dr. Jasmine Pang office were negative for DVT.  He has been put on Lovenox 1 mg/kg subcu q.12h. for full dose anticoagulation after Dr. Conley Canal and I talked last night, until we can rule out a DVT.  He is on IV antibiotics for the possibility of cellulitis.  PHYSICAL EXAMINATION:  MUSCULOSKELETAL:  His patellar area is very tender, somewhat floating and tight.  He says that it is less red and I suspect it is today.  He is really not tender in his calf.  LABORATORY DATA:  CBC today shows a white count of 9.1, hemoglobin 13.8, platelet count 47,000.  It was 42,000 yesterday.  ASSESSMENT:  I suspect what we have is inflammation versus infection versus injury to the knee or a combination of the three.  I think DVT is probably less likely; however, we do need to rule it out.  PLAN:  He will have a re-Doppler of his leg today and they will call be with the results.  If that does not show a clot and his MRI and the left knee is negative for any clot, then I would decrease Lovenox down to a preventative dose of 30 mg subcu q.12h. for knee injury until he is  on his feet and following a CBC daily.  If there is any bleeding at all, I would infuse one pheresis pack of single-donor leukopoor platelets and check a CBC one hour after the platelets are infused to see how high the "bump" is.  At that time, he could also be initiated on Amicar 1 g IV q.6h. if bleeding occurs to make his clots "stickier."  My partners will follow with you this weekend.  I will go ahead and check a baseline DIC screen tomorrow, just to make sure there is not a chronic coagulopathy behind this.  Thank you for allowing [place] to share in his care. DD:  08/24/99 TD:  08/24/99 Job: [id number]

## 2010-10-05 ENCOUNTER — Encounter: Payer: Self-pay | Admitting: Vascular Surgery

## 2010-10-30 ENCOUNTER — Encounter: Payer: Self-pay | Admitting: Vascular Surgery

## 2010-10-31 ENCOUNTER — Ambulatory Visit (INDEPENDENT_AMBULATORY_CARE_PROVIDER_SITE_OTHER): Payer: Medicare Other | Admitting: Vascular Surgery

## 2010-10-31 ENCOUNTER — Other Ambulatory Visit (INDEPENDENT_AMBULATORY_CARE_PROVIDER_SITE_OTHER): Payer: Medicare Other | Admitting: *Deleted

## 2010-10-31 ENCOUNTER — Encounter: Payer: Self-pay | Admitting: Vascular Surgery

## 2010-10-31 VITALS — BP 149/72 | HR 62 | Ht 70.0 in | Wt 178.0 lb

## 2010-10-31 DIAGNOSIS — I6529 Occlusion and stenosis of unspecified carotid artery: Secondary | ICD-10-CM

## 2010-10-31 NOTE — Progress Notes (Signed)
CC: Followup of carotid disease  HPI: [name] is a 70 y.o. male who I been following with bilateral carotid disease. He comes in for a routine followup carotid duplex scan. Since I saw him last in May of 2011 he's had no history of stroke, TIAs, expressive or receptive aphasia, or amaurosis fugax. He does admit to some occasional dizziness although this has been a chronic problem. His did not appear to be associated with any specific activity.  SOCIAL HISTORY: History  Substance Use Topics  . Smoking status: Never Smoker   . Smokeless tobacco: Not on file  . Alcohol Use: No    REVIEW OF SYSTEMS: CONSTITUTIONAL:  [ ]  fever   [ ]  chills CARDIOVASCULAR: [ ]  chest pain   [ ]  chest pressure   [ ]  palpitations   [ ]  orthopnea  [ ]  dyspnea on exertion   [ ]  claudication   [ ]  rest pain   [ ]  DVT   [ ]  phlebitis.  PHYSICAL EXAM: Filed Vitals:   10/31/10 1407  BP: 149/72  Pulse: 62   Body mass index is 25.54 kg/(m^2).  GENERAL: The patient appears their stated age. The vital signs are documented above. CARDIOVASCULAR: There is a regular rate and rhythm without significant murmur appreciated. He has a right carotid bruit. PULMONARY: There is good air exchange bilaterally without wheezing or rales. NEUROLOGIC: No focal weakness or paresthesias are detected. SKIN: There are no ulcers or rashes noted.  DATA: 1. I have independently interpreted his carotid duplex scan today which shows that he has a 60-79% right carotid stenosis with a 40-59% left carotid stenosis. Both vertebral arteries are patent with normal he directed flow. 2. I have compared this to his most recent study which was done in January of 2012 in the velocities on both sides have not changed significantly.  MEDICAL ISSUES: 1. this patient has an asymptomatic 60-79% right carotid stenosis and an asymptomatic 40-59% left carotid stenosis. He understands that we would normally not consider carotid endarterectomy was the  stenosis progressed to greater than 80%. I have ordered a followup carotid duplex scan in 6 months. I'll plan on seeing him back in one year and last is any significant change in his duplex in 6 months. He knows to call sooner if he has problems. In the meantime he knows to continue taking his aspirin.

## 2010-11-14 NOTE — Procedures (Unsigned)
CAROTID DUPLEX EXAM  INDICATION:  Carotid disease.  HISTORY: Diabetes:  No Cardiac:  No Hypertension:  Yes Smoking:  No Previous Surgery:  No CV History:  TIA 2008, currently asymptomatic Amaurosis Fugax No, Paresthesias No, Hemiparesis No                                      RIGHT             LEFT Brachial systolic pressure: Brachial Doppler waveforms: Vertebral direction of flow:        antegrade         antegrade DUPLEX VELOCITIES (cm/sec) CCA peak systolic                   105               98 ECA peak systolic                   151               193 ICA peak systolic                   294               790 ICA end diastolic                   65                41 PLAQUE MORPHOLOGY:                  mixed             mixed PLAQUE AMOUNT:                      moderate          mild PLAQUE LOCATION:                    bifurcation, ICA  bifurcation, ICA  IMPRESSION: 1. Right internal carotid artery velocities suggest 60%-79% stenosis. 2. Left internal carotid artery velocities suggest 40%-59% stenosis. 3. Stable in comparison to the previous exam.  ___________________________________________ Judeth Cornfield. Scot Dock, M.D.  EM/MEDQ  D:  11/01/2010  T:  11/01/2010  Job:  [id number]

## 2011-10-29 ENCOUNTER — Encounter: Payer: Self-pay | Admitting: Vascular Surgery

## 2011-10-30 ENCOUNTER — Encounter: Payer: Self-pay | Admitting: Vascular Surgery

## 2011-10-30 ENCOUNTER — Other Ambulatory Visit (INDEPENDENT_AMBULATORY_CARE_PROVIDER_SITE_OTHER): Payer: Medicare Other | Admitting: *Deleted

## 2011-10-30 ENCOUNTER — Ambulatory Visit (INDEPENDENT_AMBULATORY_CARE_PROVIDER_SITE_OTHER): Payer: Medicare Other | Admitting: Vascular Surgery

## 2011-10-30 VITALS — BP 143/53 | HR 58 | Resp 16 | Ht 70.0 in | Wt 179.0 lb

## 2011-10-30 DIAGNOSIS — I6529 Occlusion and stenosis of unspecified carotid artery: Secondary | ICD-10-CM

## 2011-10-30 NOTE — Assessment & Plan Note (Signed)
This patient has a 60-79% right internal carotid artery stenosis which is been relatively stable. He has no significant stenosis on the left. I've ordered a follow duplex scan in 6 months and we will see him back at that time. He understands we wouldn't consider carotid endarterectomy at the stenosis progressed to greater than 80% or he developed right hemispheric symptoms.I have discussed with the patient the nature of atherosclerosis, and emphasized the importance of maximal medical management including control of blood pressure and cholesterol levels, antiplatelet agents, and obtaining regular exercise. Fortunately he is not a smoker. We will see him back in 6 months.  He knows to call sooner if he has problems.

## 2011-10-30 NOTE — Progress Notes (Signed)
[facility]  Patient name: [name] MRN: [id number] DOB: 1940-12-13 Sex: male  REASON FOR VISIT: follow up of right carotid stenosis  HPI: [name] is a 71 y.o. male the we have been following with a moderate right carotid stenosis. He comes in for a routine follow up visit. Since I saw him last he said no history of stroke, TIAs, expressive or receptive aphasia, or amaurosis fugax. His been no significant change in his medical history.  Past Medical History  Diagnosis Date  . Diabetes mellitus 66  . Carotid artery occlusion   . CVA (cerebral vascular accident)     TIA history    Family History  Problem Relation Age of Onset  . Alzheimer's disease Father   . Cancer Sister     SOCIAL HISTORY: History  Substance Use Topics  . Smoking status: Never Smoker   . Smokeless tobacco: Never Used  . Alcohol Use: No    No Known Allergies  Current Outpatient Prescriptions  Medication Sig Dispense Refill  . aspirin 81 MG tablet Take 81 mg by mouth daily.        [name] ezetimibe (ZETIA) 10 MG tablet Take 10 mg by mouth daily.      [name] glipiZIDE (GLUCOTROL) 10 MG tablet Take 10 mg by mouth 2 (two) times daily before a meal.        . losartan (COZAAR) 25 MG tablet Take 25 mg by mouth daily.      . metFORMIN (GLUCOPHAGE) 500 MG tablet Take 500 mg by mouth 2 (two) times daily with a meal.        . rosuvastatin (CRESTOR) 10 MG tablet Take 10 mg by mouth daily.      . saxagliptin HCl (ONGLYZA) 5 MG TABS tablet Take 5 mg by mouth daily.      . Ascorbic Acid (VITAMIN C) 1000 MG tablet Take 1,000 mg by mouth daily.        . Cholecalciferol (VITAMIN D3) 1000 UNITS CAPS Take 1,000 mg by mouth daily.        . fish oil-omega-3 fatty acids 1000 MG capsule Take 2 g by mouth daily.          REVIEW OF SYSTEMS: Valu.Nieves ] denotes positive finding; [  ] denotes negative finding  CARDIOVASCULAR:  [ ]  chest pain   [ ]  chest pressure   [ ]  palpitations   [ ]  orthopnea   [ ]   dyspnea on exertion   [ ]  claudication   [ ]  rest pain   [ ]  DVT   [ ]  phlebitis PULMONARY:   [ ]  productive cough   [ ]  asthma   [ ]  wheezing NEUROLOGIC:   [ ]  weakness  [ ]  paresthesias  [ ]  aphasia  [ ]  amaurosis  [ ]  dizziness PSYCHIATRIC:  [ ]  history of major depression INTEGUMENTARY:  [ ]  rashes  [ ]  ulcers CONSTITUTIONAL:  [ ]  fever   [ ]  chills  PHYSICAL EXAM: Filed Vitals:   10/30/11 1122 10/30/11 1127  BP: 144/53 143/53  Pulse: 61 58  Resp: 16   Height: 5' 10"  (1.778 m)   Weight: 179 lb (81.194 kg)   SpO2: 100%    Body mass index is 25.68 kg/(m^2). GENERAL: The patient is a well-nourished male, in no acute distress. The vital signs are documented above. CARDIOVASCULAR: There is a regular rate and rhythm. He has a right carotid bruit. He has palpable femoral  pulses. Both feet are warm and well-perfused. He has no significant lower extremity swelling. PULMONARY: There is good air exchange bilaterally without wheezing or rales. ABDOMEN: Soft and non-tender with normal pitched bowel sounds.  MUSCULOSKELETAL: There are no major deformities or cyanosis. NEUROLOGIC: No focal weakness or paresthesias are detected. SKIN: There are no ulcers or rashes noted. PSYCHIATRIC: The patient has a normal affect.  DATA:  I have independently interpreted his carotid duplex scan which shows a 60-79% right carotid stenosis with no significant stenosis on the left. Both vertebral arteries are patent with normally directed flow. Arm pressures are essentially equal although he does have some evidence of stenosis of the right subclavian artery by duplex.  MEDICAL ISSUES:  Occlusion and stenosis of carotid artery without mention of cerebral infarction This patient has a 60-79% right internal carotid artery stenosis which is been relatively stable. He has no significant stenosis on the left. I've ordered a follow duplex scan in 6 months and we will see him back at that time. He understands we wouldn't  consider carotid endarterectomy at the stenosis progressed to greater than 80% or he developed right hemispheric symptoms.I have discussed with the patient the nature of atherosclerosis, and emphasized the importance of maximal medical management including control of blood pressure and cholesterol levels, antiplatelet agents, and obtaining regular exercise. Fortunately he is not a smoker. We will see him back in 6 months.  He knows to call sooner if he has problems.    [place] [facility]  [place] Beeper: [phone]

## 2011-10-31 NOTE — Addendum Note (Signed)
Addended by: Mena Goes on: 10/31/2011 08:33 AM   Modules accepted: Orders

## 2012-04-29 ENCOUNTER — Ambulatory Visit: Payer: Medicare Other | Admitting: Neurosurgery

## 2012-04-29 ENCOUNTER — Other Ambulatory Visit: Payer: Medicare Other

## 2012-05-15 ENCOUNTER — Other Ambulatory Visit (INDEPENDENT_AMBULATORY_CARE_PROVIDER_SITE_OTHER): Payer: Medicare Other

## 2012-05-15 ENCOUNTER — Other Ambulatory Visit: Payer: Self-pay

## 2012-05-15 ENCOUNTER — Ambulatory Visit: Payer: Medicare Other | Admitting: Neurosurgery

## 2012-05-15 DIAGNOSIS — I6529 Occlusion and stenosis of unspecified carotid artery: Secondary | ICD-10-CM

## 2012-05-18 ENCOUNTER — Encounter: Payer: Self-pay | Admitting: Vascular Surgery

## 2012-06-26 HISTORY — PX: CATARACT EXTRACTION W/ INTRAOCULAR LENS IMPLANT: SHX1309

## 2012-07-24 ENCOUNTER — Telehealth: Payer: Self-pay | Admitting: Oncology

## 2012-07-24 NOTE — Telephone Encounter (Signed)
S/W PT WIFE IN RE NP APPT 07/23 @ 11 W/DR. [place] DX-PANCYTOPENIA WELCOME PACKET MAILED.

## 2012-07-24 NOTE — Telephone Encounter (Signed)
LVOM FOR PT TO RETURN CALL IN RE NP APPT.

## 2012-07-24 NOTE — Telephone Encounter (Signed)
C/D 07/24/12 for appt. 09/09/12

## 2012-07-27 HISTORY — PX: CATARACT EXTRACTION W/ INTRAOCULAR LENS IMPLANT: SHX1309

## 2012-08-11 ENCOUNTER — Encounter: Payer: Self-pay | Admitting: Oncology

## 2012-08-11 ENCOUNTER — Other Ambulatory Visit: Payer: Self-pay | Admitting: Oncology

## 2012-08-11 DIAGNOSIS — D693 Immune thrombocytopenic purpura: Secondary | ICD-10-CM

## 2012-08-11 DIAGNOSIS — D72819 Decreased white blood cell count, unspecified: Secondary | ICD-10-CM

## 2012-08-11 HISTORY — DX: Immune thrombocytopenic purpura: D69.3

## 2012-08-11 HISTORY — DX: Decreased white blood cell count, unspecified: D72.819

## 2012-08-12 ENCOUNTER — Telehealth: Payer: Self-pay | Admitting: Oncology

## 2012-08-13 ENCOUNTER — Telehealth: Payer: Self-pay | Admitting: Oncology

## 2012-08-13 NOTE — Telephone Encounter (Signed)
LVOM FOR PT RETURN CALL IN RE TO LAB APPT.

## 2012-09-01 ENCOUNTER — Other Ambulatory Visit (HOSPITAL_BASED_OUTPATIENT_CLINIC_OR_DEPARTMENT_OTHER): Payer: Medicare Other | Admitting: Lab

## 2012-09-01 DIAGNOSIS — D693 Immune thrombocytopenic purpura: Secondary | ICD-10-CM

## 2012-09-01 DIAGNOSIS — D72819 Decreased white blood cell count, unspecified: Secondary | ICD-10-CM

## 2012-09-01 LAB — COMPREHENSIVE METABOLIC PANEL (CC13)
CO2: 24 mEq/L (ref 22–29)
Creatinine: 1.1 mg/dL (ref 0.7–1.3)
Glucose: 295 mg/dl — ABNORMAL HIGH (ref 70–140)
Total Bilirubin: 0.91 mg/dL (ref 0.20–1.20)

## 2012-09-01 LAB — CBC & DIFF AND RETIC
Basophils Absolute: 0 10*3/uL (ref 0.0–0.1)
Eosinophils Absolute: 0.1 10*3/uL (ref 0.0–0.5)
HGB: 13.2 g/dL (ref 13.0–17.1)
Immature Retic Fract: 4.4 % (ref 3.00–10.60)
MCV: 92.5 fL (ref 79.3–98.0)
MONO%: 8.7 % (ref 0.0–14.0)
NEUT#: 2.6 10*3/uL (ref 1.5–6.5)
RDW: 15.1 % — ABNORMAL HIGH (ref 11.0–14.6)
Retic Ct Abs: 58.24 10*3/uL (ref 34.80–93.90)
lymph#: 1 10*3/uL (ref 0.9–3.3)

## 2012-09-01 LAB — MORPHOLOGY: PLT EST: DECREASED

## 2012-09-09 ENCOUNTER — Ambulatory Visit (HOSPITAL_BASED_OUTPATIENT_CLINIC_OR_DEPARTMENT_OTHER): Payer: Medicare Other | Admitting: Oncology

## 2012-09-09 ENCOUNTER — Other Ambulatory Visit: Payer: Medicare Other | Admitting: Lab

## 2012-09-09 ENCOUNTER — Encounter: Payer: Self-pay | Admitting: Oncology

## 2012-09-09 ENCOUNTER — Telehealth: Payer: Self-pay | Admitting: Oncology

## 2012-09-09 ENCOUNTER — Ambulatory Visit (HOSPITAL_BASED_OUTPATIENT_CLINIC_OR_DEPARTMENT_OTHER): Payer: Medicare Other

## 2012-09-09 VITALS — BP 150/65 | HR 67 | Temp 97.0°F | Resp 18 | Ht 70.0 in | Wt 183.7 lb

## 2012-09-09 DIAGNOSIS — D72819 Decreased white blood cell count, unspecified: Secondary | ICD-10-CM

## 2012-09-09 DIAGNOSIS — D693 Immune thrombocytopenic purpura: Secondary | ICD-10-CM

## 2012-09-09 DIAGNOSIS — K55059 Acute (reversible) ischemia of intestine, part and extent unspecified: Secondary | ICD-10-CM

## 2012-09-09 DIAGNOSIS — K55069 Acute infarction of intestine, part and extent unspecified: Secondary | ICD-10-CM

## 2012-09-09 NOTE — Progress Notes (Signed)
Checked in new pt with no financial concerns. °

## 2012-09-09 NOTE — Telephone Encounter (Signed)
Gave pt appt for lab only , waiting for Dr. Darnell Level to give me anothetr MD visit spot, pt will be on vacation on 8/29

## 2012-09-09 NOTE — Progress Notes (Signed)
New Patient Hematology-Oncology Evaluation   [name] [id number] [birth year] 72 y.o. 09/09/2012  CC: Dr. 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 Sometime after that, probably 1997, he developed ITP. He was evaluated in this office by one of my former partners. He was treated with prednisone. It was so long ago that all of this data has been purged from the system. His platelet count stabilized but never normalized. He was referred back to the care of his primary care physician for convenience. His primary care physician has provided platelet counts back as far as November of 2005. He has had a wide range of values between a low of 32,000 recorded in August 2013 and a high of 62,000 recorded in May of 2008. Most recent count on 07/16/2012 was 36,000 with a value just a few months prior to that on 03/31/2012 of 60,000. CBC in our office today with platelet count 35,000. He has had a chronic, mild, intermittent, leukopenia with white counts in the range of 4100 -- 5500. Hemoglobin has been in the range of 13-13.8 g. He has had no clinical signs of bleeding and denies epistaxis, hematuria, hematochezia, or melena.   PMH: Past Medical History  Diagnosis Date  . Diabetes mellitus2 66  . Carotid artery occlusion   . CVA (cerebral vascular accident) approximate 2004      TIA history  . 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 Leukopenia 08/11/2012  Hyperlipidemia. Fatty liver.  Previous surgery: Bilateral cataract repair.  Allergies: No Known Allergies  Medications: Aspirin 81 mg by mouth daily, Zetia 10 mg daily, Glucotrol 10 mg twice a day, Cozaar 25 mg daily, Glucophage 500 mg twice a day, Crestor  10 mg daily, ongylza 5 mg daily    Social History: His work for the [employer]. No toxic or chemical exposures. No exposure to high-dose insecticides. Married. Wife accompanies him today. 2 sons who are healthy.  reports that he has never smoked. He has never used smokeless tobacco. He reports that he does not drink alcohol or use illicit drugs.  Family History: Family History  Problem Relation Age of Onset  . Alzheimer's disease Father   . Cancer Sister   A sister died of cancer at age 63. She also had low platelets. 2 brothers alive and well.  Review of Systems: Constitutional symptoms: No constitutional symptoms HEENT: No sore throat Respiratory: No cough or dyspnea Cardiovascular:  No chest pain or palpitations Gastrointestinal ROS: No abdominal pain or change in bowel habit Genito-Urinary ROS: No urinary tract symptoms Hematological and Lymphatic: No spontaneous ecchymoses, no petechial rash Musculoskeletal: No muscle bone or joint pain Neurologic: No headache or change in vision Dermatologic: No rash Remaining ROS negative.  Physical Exam: Blood pressure 150/65, pulse 67, temperature 97 F (36.1 C), temperature source Oral, resp. rate 18, height 5' 10"  (1.778 m), weight 183 lb 11.2 oz (83.326 kg). Wt Readings from Last 3 Encounters:  09/09/12 183 lb 11.2 oz (83.326 kg)  10/30/11 179 lb (81.194 kg)  10/31/10 178 lb (80.74 kg)    General appearance: Well-nourished Caucasian man HENNT: Pharynx no erythema exudate or mass Lymph nodes: No lymphadenopathy Breasts: Lungs: Clear to auscultation resonant to percussion Heart: Regular rhythm, no murmur Vascular: No cyanosis,  no carotid bruits Abdominal: Soft, nontender, no mass, no organomegaly GU: Extremities: No edema, no calf tenderness Neurologic: Mental status intact, PERRLA, optic disc sharp, vessels normal, motor strength 5 over 5, reflexes 1+ symmetric, sensation intact to vibration over the  fingertips. Skin: Venous stasis changes lower extremities. No petechial rash    Lab Results: Lab Results  Component Value Date   WBC 4.1 09/01/2012   HGB 13.2 09/01/2012   HCT 38.5 09/01/2012   MCV 92.5 09/01/2012   PLT 35* 09/01/2012     Chemistry      Component Value Date/Time   NA 140 09/01/2012 0933   K 4.6 09/01/2012 0933   CO2 24 09/01/2012 0933   BUN 16.2 09/01/2012 0933   CREATININE 1.1 09/01/2012 0933      Component Value Date/Time   CALCIUM 9.2 09/01/2012 0933   ALKPHOS 55 09/01/2012 0933   AST 27 09/01/2012 0933   ALT 28 09/01/2012 0933   BILITOT 0.91 09/01/2012 0933       Review of peripheral blood film: Normochromic normocytic red cells, mature neutrophils and lymphocytes, platelets are decreased to-4 per high-power field     Impression and Plan: Chronic ITP  Platelet counts have fluctuated between 30 and 60,000 over the last 9 years with no signs of progressive fall. He has had no clinical bleeding problems.  Recommendations: I think continued observation alone is reasonable. I would like to check platelet counts on a monthly basis for the next few months and we can do that through our  office. If the patient would need a surgical procedure, then I would give him intravenous immunoglobulin which usually works to elevate the platelet count within about 48 hours. If his platelet count is consistently less than 30,000 I would reinitiate a trial of steroids. If he failed steroids, I would use Rituxan. We discussed the option of splenectomy but I don't think he will ever come to that.     Annia Belt, MD 09/09/2012, 8:14 PM

## 2012-09-29 ENCOUNTER — Telehealth: Payer: Self-pay | Admitting: Oncology

## 2012-09-29 NOTE — Telephone Encounter (Signed)
called pt and left messaghe regarding M<D visit moved from August to September per pt rqst

## 2012-10-06 ENCOUNTER — Other Ambulatory Visit (HOSPITAL_BASED_OUTPATIENT_CLINIC_OR_DEPARTMENT_OTHER): Payer: Medicare Other | Admitting: Lab

## 2012-10-06 DIAGNOSIS — D693 Immune thrombocytopenic purpura: Secondary | ICD-10-CM

## 2012-10-06 DIAGNOSIS — D72819 Decreased white blood cell count, unspecified: Secondary | ICD-10-CM

## 2012-10-06 DIAGNOSIS — K55069 Acute infarction of intestine, part and extent unspecified: Secondary | ICD-10-CM

## 2012-10-06 LAB — CBC & DIFF AND RETIC
BASO%: 0.4 % (ref 0.0–2.0)
EOS%: 2.9 % (ref 0.0–7.0)
LYMPH%: 28.9 % (ref 14.0–49.0)
MCHC: 34.2 g/dL (ref 32.0–36.0)
MONO#: 0.4 10*3/uL (ref 0.1–0.9)
Platelets: 39 10*3/uL — ABNORMAL LOW (ref 140–400)
RBC: 3.98 10*6/uL — ABNORMAL LOW (ref 4.20–5.82)
Retic %: 1.83 % — ABNORMAL HIGH (ref 0.80–1.80)
WBC: 4.9 10*3/uL (ref 4.0–10.3)

## 2012-10-09 ENCOUNTER — Telehealth: Payer: Self-pay | Admitting: Oncology

## 2012-10-09 LAB — PNH PROFILE (-HIGH SENSITIVITY)

## 2012-10-09 NOTE — Telephone Encounter (Signed)
Called pt and left message regarding lab and MD on 10/27/12

## 2012-10-16 ENCOUNTER — Ambulatory Visit: Payer: Medicare Other | Admitting: Oncology

## 2012-10-27 ENCOUNTER — Other Ambulatory Visit (HOSPITAL_BASED_OUTPATIENT_CLINIC_OR_DEPARTMENT_OTHER): Payer: Medicare Other | Admitting: Lab

## 2012-10-27 ENCOUNTER — Telehealth: Payer: Self-pay | Admitting: Oncology

## 2012-10-27 ENCOUNTER — Ambulatory Visit (HOSPITAL_BASED_OUTPATIENT_CLINIC_OR_DEPARTMENT_OTHER): Payer: Medicare Other | Admitting: Oncology

## 2012-10-27 VITALS — BP 161/70 | HR 64 | Temp 97.0°F | Resp 20 | Ht 70.0 in | Wt 184.4 lb

## 2012-10-27 DIAGNOSIS — D693 Immune thrombocytopenic purpura: Secondary | ICD-10-CM

## 2012-10-27 DIAGNOSIS — K55059 Acute (reversible) ischemia of intestine, part and extent unspecified: Secondary | ICD-10-CM

## 2012-10-27 DIAGNOSIS — D72819 Decreased white blood cell count, unspecified: Secondary | ICD-10-CM

## 2012-10-27 DIAGNOSIS — K55069 Acute infarction of intestine, part and extent unspecified: Secondary | ICD-10-CM

## 2012-10-27 LAB — CBC WITH DIFFERENTIAL/PLATELET
BASO%: 0.4 % (ref 0.0–2.0)
EOS%: 1.9 % (ref 0.0–7.0)
LYMPH%: 26.2 % (ref 14.0–49.0)
MCH: 32.4 pg (ref 27.2–33.4)
MCHC: 34.4 g/dL (ref 32.0–36.0)
MCV: 94.1 fL (ref 79.3–98.0)
MONO#: 0.3 10*3/uL (ref 0.1–0.9)
MONO%: 7 % (ref 0.0–14.0)
Platelets: 36 10*3/uL — ABNORMAL LOW (ref 140–400)
RBC: 3.9 10*6/uL — ABNORMAL LOW (ref 4.20–5.82)
WBC: 4.1 10*3/uL (ref 4.0–10.3)

## 2012-10-27 NOTE — Progress Notes (Signed)
Hematology and Oncology Follow Up Visit  EULAN HEYWARD [id number] 08/02/1940 72 y.o. 10/27/2012 3:00 PM   Principle Diagnosis: Encounter Diagnoses  Name Primary?  . ITP (idiopathic thrombocytopenic purpura) Yes  . Leukopenia      Interim History:   Follow visit for this 72 year old man with chronic ITP. Platelet count has ranged in the 30-60,000 range for the last 9 years. He has never developed another blood disorder. He was seen in our office in the remote past. I was recently asked to reevaluate him. Please see my office consult note dated 09/09/2012 for complete details of his past medical history and recent reevaluation. I put him back on monthly blood counts to see if there was a pattern. Over the last 3 months ,  platelets have been in his previous range between 35,000 and 39,000. Platelet count today is 36,000. Hemoglobin 12.5-13.2, white count 4100-4900 with a normal differential.  He's had no bleeding and specifically denies epistaxis, gum bleeding, hematuria, hematochezia, or melena. 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  Medications: reviewed  Allergies: No Known Allergies  Review of Systems: Constitutional:   No constitutional symptoms HEENT no sore throat Respiratory: No cough or dyspnea Cardiovascular:  No chest pain or palpitations Gastrointestinal: No abdominal pain or change in bowel habit Genito-Urinary: No urinary tract symptoms Musculoskeletal: Stiff joints in his hands Neurologic: No headache or change in vision Skin: No rash or ecchymosis  Remaining ROS negative.     Physical Exam: Blood pressure 161/70, pulse 64, temperature 97 F (36.1 C), temperature source Oral, resp. rate 20, height 5' 10"  (1.778 m), weight 184 lb 6.4 oz (83.643 kg). Wt Readings from Last 3 Encounters:  10/27/12 184 lb 6.4 oz (83.643 kg)  09/09/12 183 lb 11.2 oz (83.326 kg)  10/30/11 179 lb (81.194 kg)      General appearance: Well-nourished Caucasian man HENNT: Pharynx no erythema or exudate Lymph nodes: No lymphadenopathy Breasts: Lungs: Clear to auscultation resonant to percussion Heart: Regular rhythm no murmur Abdomen: Soft, nontender, no mass, no organomegaly Extremities: No edema, no calf tenderness Musculoskeletal: No joint deformities GU: Vascular: No cyanosis Neurologic: No focal deficit Skin: No rash or ecchymosis  Lab Results: Lab Results  Component Value Date   WBC 4.1 10/27/2012   HGB 12.6* 10/27/2012   HCT 36.6* 10/27/2012   MCV 94.1 10/27/2012   PLT 36* 10/27/2012     Chemistry      Component Value Date/Time   NA 140 09/01/2012 0933   K 4.6 09/01/2012 0933   CO2 24 09/01/2012 0933   BUN 16.2 09/01/2012 0933   CREATININE 1.1 09/01/2012 0933      Component Value Date/Time   CALCIUM 9.2 09/01/2012 0933   ALKPHOS 55 09/01/2012 0933   AST 27 09/01/2012 0933   ALT 28 09/01/2012 0933   BILITOT 0.91 09/01/2012 0933      .  Impression: Chronic ITP Platelet count remains low but stable and he's had no bleeding complications. We again reviewed a strategy going into the future. If the platelet count falls consistently less than 30,000 or if he starts to have bleeding problems at current platelet count, that would be an indication for treatment. If he schedules elective surgery, I would put him on a trial of prednisone. If he requires urgent surgery, I would treat him either with IVIG or WinRho anti-D. immunoglobulin  I'm going to decrease frequency of blood counts every 3 months. Followup  visit in 6 months with my nurse practitioner. Annual visit with me.   CC:. Dr. Jani Gravel   Annia Belt, MD 9/9/20143:00 PM

## 2012-10-27 NOTE — Telephone Encounter (Signed)
gv and printed appt sched and avs for pt for Dec and march per MD pof

## 2012-11-03 ENCOUNTER — Other Ambulatory Visit: Payer: Medicare Other

## 2012-11-17 ENCOUNTER — Encounter: Payer: Self-pay | Admitting: Family

## 2012-11-18 ENCOUNTER — Encounter: Payer: Self-pay | Admitting: Family

## 2012-11-18 ENCOUNTER — Ambulatory Visit (INDEPENDENT_AMBULATORY_CARE_PROVIDER_SITE_OTHER): Payer: Medicare Other | Admitting: Family

## 2012-11-18 ENCOUNTER — Ambulatory Visit (HOSPITAL_COMMUNITY)
Admission: RE | Admit: 2012-11-18 | Discharge: 2012-11-18 | Disposition: A | Discharge status: Home / Self Care | Payer: Medicare Other | Source: Ambulatory Visit | Attending: Vascular Surgery | Admitting: Vascular Surgery

## 2012-11-18 DIAGNOSIS — I6529 Occlusion and stenosis of unspecified carotid artery: Secondary | ICD-10-CM

## 2012-11-18 NOTE — Progress Notes (Signed)
Established Carotid Patient  Previous Carotid surgery: No  History of Present Illness  [name] is a 72 y.o. male patient whom Dr. 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 He has had no significant stenosis in the left ICA. He had a "mild" stroke 8-10 years ago which manifested as dizziness and bilateral arms weakness, but no stroke or TIA symptoms since then, states it was noted on either CT or MRI which also found the carotid artery stenosis. 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 Lajoyce Corners, he had no stents or surgeries for this. Since he was on the anticoagulants, he has developed low platelets which is managed by Dr. Beryle Beams. Patient states he has not had any stomach pain since treated for the mesenteric vein thrombosis and his weight has remained the same since treated for this. He did lose weight before mesenteric vein thrombosis treatment, but gained it back afterward. Patient denies any cardiac problems or dyspnea.  The patient denies amaurosis fugax or monocular blindness.  The patient  denies facial drooping.  Pt. denies hemiplegia.  The patient denies receptive or expressive aphasia.  Patient denies residual weakness in arms. Patient denies claudication symptoms, denies non-healing wounds.  The patient's previous neurologic status is  Unchanged.   Patient reports New Medical or Surgical History: bilateral cataract extraction with IOL.  Pt Diabetic: Yes, he does not know what his A1C has been, note that he had a 295 glucose in CMP July, 2014. Pt smoker: non-smoker  Pt meds include: Statin : Yes Betablocker: No ASA: Yes Other anticoagulants/antiplatelets: no   Past Medical History  Diagnosis Date  . Diabetes mellitus 66  . Carotid artery occlusion    . CVA (cerebral vascular accident)     TIA history  . 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 Leukopenia 08/11/2012    Social History History  Substance Use Topics  . Smoking status: Never Smoker   . Smokeless tobacco: Never Used  . Alcohol Use: No    Family History Family History  Problem Relation Age of Onset  . Alzheimer's disease Father   . Cancer Sister     Surgical History Past Surgical History  Procedure Laterality Date  . Eye surgery Right Jun 26, 2012    Cataract  . Eye surgery Left July 27, 2012    Cataract    No Known Allergies  Current Outpatient Prescriptions  Medication Sig Dispense Refill  . aspirin 81 MG tablet Take 81 mg by mouth daily.        [name] ezetimibe (ZETIA) 10 MG tablet Take 10 mg by mouth daily.      [name] glipiZIDE (GLUCOTROL) 10 MG tablet Take 10 mg by mouth 2 (two) times daily before a meal.        . losartan (COZAAR) 25 MG tablet Take 25 mg by mouth daily.      . metFORMIN (GLUCOPHAGE) 500 MG tablet Take 500 mg by mouth 2 (two) times daily with a meal.        . rosuvastatin (CRESTOR) 10 MG tablet Take 10 mg by mouth daily.      . saxagliptin HCl (ONGLYZA) 5 MG TABS tablet Take 5 mg by mouth daily.       No current facility-administered medications for this visit.    Review of  Systems : [x]  Positive   [ ]  Denies  General:[ ]  Weight loss,  [ ]  Weight gain, [ ]  Loss of appetite, [ ]  Fever, [ ]  chills  Neurologic: [ ]  Dizziness, [ ]  Blackouts, [ ]  Headaches, [ ]  Seizure [ ]  Stroke, [ ]  "Mini stroke", [ ]  Slurred speech, [ ]  Temporary blindness;  [ ] weakness,  Ear/Nose/Throat: [ ]  Change in hearing, [ ]  Nose bleeds, [ ]  Hoarseness  Vascular:[ ]  Pain in legs with walking, [ ]  Pain in feet while lying flat , [ ]   Non-healing ulcer, [ ]  Blood clot in vein,    Pulmonary: [ ]  Home oxygen, [ ]   Productive cough, [ ]  Bronchitis, [ ]  Coughing up blood,  [ ]  Asthma, [ ]  Wheezing  Musculoskeletal:  [ ]  Arthritis, [ ]  Joint pain, [ ]   low back pain  Cardiac: [ ]  Chest pain, [ ]  Shortness of breath when lying flat, [ ]  Shortness of breath with exertion, [ ]  Palpitations, [ ]  Heart murmur, [ ]   Atrial fibrillation  Hematologic:[ ]  Easy Bruising, [ ]  Anemia; [ ]  Hepatitis  Psychiatric: [ ]   Depression, [ ]  Anxiety   Gastrointestinal: [ ]  Black stool, [ ]  Blood in stool, [ ]  Peptic ulcer disease,  [ ]  Gastroesophageal Reflux, [ ]  Trouble swallowing, [ ]  Diarrhea, [ ]  Constipation  Urinary: [ ]  chronic Kidney disease, [ ]  on HD, [ ]  Burning with urination, [ ]  Frequent urination, [ ]  Difficulty urinating;   Skin: [ ]  Rashes, [ ]  Wounds    Physical Examination  Filed Vitals:   11/18/12 1342  BP: 126/64  Pulse: 70  Resp:    Filed Weights   11/18/12 1339  Weight: 182 lb (82.555 kg)   Body mass index is 26.11 kg/(m^2).  General: WDWN male in NAD GAIT: normal Eyes: PERRLA Pulmonary:  CTAB, Negative  Rales, Negative rhonchi, & Negative wheezing.  Cardiac: regular Rhythm ,  Negative Murmurs. He does have trace to 1+ bilateral pretibial pitting edema with hemosiderin deposits, no other venous stasis changes.  VASCULAR EXAM Carotid Bruits Left Right   Negative Negative        Aorta is not palpable. Radial artery pulses are 2+ bilaterally                                                                                                                        LE Pulses LEFT RIGHT       POPLITEAL  not palpable   not palpable       POSTERIOR TIBIAL  not palpable   not palpable        DORSALIS PEDIS      ANTERIOR TIBIAL  palpable   palpable     Gastrointestinal: soft, nontender, BS WNL, no r/g,  negative masses.  Musculoskeletal: Negative muscle atrophy/wasting. M/S 5/5 throughout , Extremities without ischemic changes.  Neurologic: A&O X 3; Appropriate Affect ; SENSATION ;normal;  Speech is normal, CN 2-12  intact, Pain and light touch intact in extremities, Motor exam as listed above.   Non-Invasive  Vascular Imaging CAROTID DUPLEX 11/18/2012   Right ICA 60 - 79 % stenosis Left ICA <40% stenosis  These findings are Unchanged from previous exam   Assessment: [name] is a 72 y.o. male who presents with asymptomatic 60 - 79 % Right ICA  Stenosis and <40% left ICA stenosis. The  ICA stenosis is  Unchanged from previous exam. Brachial systolic blood pressures are equivalent.   Plan: Follow-up in 6 months with Carotid Duplex scan. Advised 20-30 mm Hg graduated compression stockings during the day and elevation of legs when sitting for mild venous insufficiency.   I discussed in depth with the patient the nature of atherosclerosis, and emphasized the importance of maximal medical management including strict control of blood pressure, blood glucose, and lipid levels, obtaining regular exercise, and continued cessation of smoking.  The patient is aware that without maximal medical management the underlying atherosclerotic disease process will progress, limiting the benefit of any interventions. The patient was given information about stroke prevention and what symptoms should prompt the patient to seek immediate medical care. Thank you for allowing [place] to participate in this patient's care.  Clemon Chambers, RN, MSN, FNP-C [facility] Office: [phone]  Clinic Physician: Scot Dock  11/18/2012 1:16 PM

## 2012-11-18 NOTE — Patient Instructions (Signed)
Stroke Prevention Some medical conditions and behaviors are associated with an increased chance of having a stroke. You may prevent a stroke by making healthy choices and managing medical conditions. Reduce your risk of having a stroke by:  Staying physically active. Get at least 30 minutes of activity on most or all days.  Not smoking. It may also be helpful to avoid exposure to secondhand smoke.  Limiting alcohol use. Moderate alcohol use is considered to be:  No more than 2 drinks per day for men.  No more than 1 drink per day for nonpregnant women.  Eating healthy foods.  Include 5 or more servings of fruits and vegetables a day.  Certain diets may be prescribed to address high blood pressure, high cholesterol, diabetes, or obesity.  Managing your cholesterol levels.  A low-saturated fat, low-trans fat, low-cholesterol, and high-fiber diet may control cholesterol levels.  Take any prescribed medicines to control cholesterol as directed by your caregiver.  Managing your diabetes.  A controlled-carbohydrate, controlled-sugar diet is recommended to manage diabetes.  Take any prescribed medicines to control diabetes as directed by your caregiver.  Controlling your high blood pressure (hypertension).  A low-salt (sodium), low-saturated fat, low-trans fat, and low-cholesterol diet is recommended to manage high blood pressure.  Take any prescribed medicines to control hypertension as directed by your caregiver.  Maintaining a healthy weight.  A reduced-calorie, low-sodium, low-saturated fat, low-trans fat, low-cholesterol diet is recommended to manage weight.  Stopping drug abuse.  Avoiding birth control pills.  Talk to your caregiver about the risks of taking birth control pills if you are over 35 years old, smoke, get migraines, or have ever had a blood clot.  Getting evaluated for sleep disorders (sleep apnea).  Talk to your caregiver about getting a sleep evaluation  if you snore a lot or have excessive sleepiness.  Taking medicines as directed by your caregiver.  For some people, aspirin or blood thinners (anticoagulants) are helpful in reducing the risk of forming abnormal blood clots that can lead to stroke. If you have the irregular heart rhythm of atrial fibrillation, you should be on a blood thinner unless there is a good reason you cannot take them.  Understand all your medicine instructions. SEEK IMMEDIATE MEDICAL CARE IF:   You have sudden weakness or numbness of the face, arm, or leg, especially on one side of the body.  You have sudden confusion.  You have trouble speaking (aphasia) or understanding.  You have sudden trouble seeing in one or both eyes.  You have sudden trouble walking.  You have dizziness.  You have a loss of balance or coordination.  You have a sudden, severe headache with no known cause.  You have new chest pain or an irregular heartbeat. Any of these symptoms may represent a serious problem that is an emergency. Do not wait to see if the symptoms will go away. Get medical help right away. Call your local emergency services (911 in [place] Do not drive yourself to the hospital. Document Released: 03/14/2004 Document Revised: 04/29/2011 Document Reviewed: 09/24/2010 [employer] Patient Information 2014 [employer]  

## 2012-11-19 NOTE — Addendum Note (Signed)
Addended by: Mena Goes on: 11/19/2012 10:08 AM   Modules accepted: Orders

## 2012-12-24 ENCOUNTER — Other Ambulatory Visit: Payer: Self-pay

## 2013-01-19 ENCOUNTER — Other Ambulatory Visit (HOSPITAL_BASED_OUTPATIENT_CLINIC_OR_DEPARTMENT_OTHER): Payer: Medicare Other

## 2013-01-19 DIAGNOSIS — D72819 Decreased white blood cell count, unspecified: Secondary | ICD-10-CM

## 2013-01-19 DIAGNOSIS — D693 Immune thrombocytopenic purpura: Secondary | ICD-10-CM

## 2013-01-19 LAB — MORPHOLOGY: PLT EST: DECREASED

## 2013-01-19 LAB — CBC WITH DIFFERENTIAL/PLATELET
Basophils Absolute: 0 10*3/uL (ref 0.0–0.1)
Eosinophils Absolute: 0.2 10*3/uL (ref 0.0–0.5)
HCT: 37.6 % — ABNORMAL LOW (ref 38.4–49.9)
HGB: 12.9 g/dL — ABNORMAL LOW (ref 13.0–17.1)
LYMPH%: 27.4 % (ref 14.0–49.0)
MONO#: 0.5 10*3/uL (ref 0.1–0.9)
NEUT#: 2.6 10*3/uL (ref 1.5–6.5)
NEUT%: 57.9 % (ref 39.0–75.0)
Platelets: 39 10*3/uL — ABNORMAL LOW (ref 140–400)
WBC: 4.5 10*3/uL (ref 4.0–10.3)
lymph#: 1.2 10*3/uL (ref 0.9–3.3)

## 2013-02-18 DIAGNOSIS — I82409 Acute embolism and thrombosis of unspecified deep veins of unspecified lower extremity: Secondary | ICD-10-CM

## 2013-02-18 HISTORY — DX: Acute embolism and thrombosis of unspecified deep veins of unspecified lower extremity: I82.409

## 2013-03-08 ENCOUNTER — Ambulatory Visit
Admission: RE | Admit: 2013-03-08 | Discharge: 2013-03-08 | Disposition: A | Discharge status: Home / Self Care | Payer: Medicare Other | Source: Ambulatory Visit | Attending: Internal Medicine | Admitting: Internal Medicine

## 2013-03-08 ENCOUNTER — Other Ambulatory Visit: Payer: Self-pay | Admitting: Internal Medicine

## 2013-03-08 DIAGNOSIS — R52 Pain, unspecified: Secondary | ICD-10-CM

## 2013-03-19 ENCOUNTER — Inpatient Hospital Stay (HOSPITAL_COMMUNITY)
Admission: EM | Admit: 2013-03-19 | Discharge: 2013-03-24 | DRG: 603 | Disposition: A | Discharge status: Home Health | Payer: Medicare Other | Attending: Internal Medicine | Admitting: Internal Medicine

## 2013-03-19 ENCOUNTER — Emergency Department (HOSPITAL_COMMUNITY): Payer: Medicare Other

## 2013-03-19 ENCOUNTER — Encounter (HOSPITAL_COMMUNITY): Payer: Self-pay | Admitting: Emergency Medicine

## 2013-03-19 ENCOUNTER — Ambulatory Visit (INDEPENDENT_AMBULATORY_CARE_PROVIDER_SITE_OTHER): Payer: Medicare Other | Admitting: General Surgery

## 2013-03-19 ENCOUNTER — Encounter (INDEPENDENT_AMBULATORY_CARE_PROVIDER_SITE_OTHER): Payer: Self-pay | Admitting: General Surgery

## 2013-03-19 VITALS — BP 160/70 | HR 92 | Temp 97.9°F | Resp 15 | Ht 70.0 in | Wt 184.0 lb

## 2013-03-19 DIAGNOSIS — Z8249 Family history of ischemic heart disease and other diseases of the circulatory system: Secondary | ICD-10-CM

## 2013-03-19 DIAGNOSIS — L0390 Cellulitis, unspecified: Secondary | ICD-10-CM | POA: Diagnosis present

## 2013-03-19 DIAGNOSIS — D72819 Decreased white blood cell count, unspecified: Secondary | ICD-10-CM

## 2013-03-19 DIAGNOSIS — L03119 Cellulitis of unspecified part of limb: Principal | ICD-10-CM

## 2013-03-19 DIAGNOSIS — D693 Immune thrombocytopenic purpura: Secondary | ICD-10-CM

## 2013-03-19 DIAGNOSIS — L03115 Cellulitis of right lower limb: Secondary | ICD-10-CM | POA: Diagnosis present

## 2013-03-19 DIAGNOSIS — M353 Polymyalgia rheumatica: Secondary | ICD-10-CM | POA: Diagnosis present

## 2013-03-19 DIAGNOSIS — I6529 Occlusion and stenosis of unspecified carotid artery: Secondary | ICD-10-CM

## 2013-03-19 DIAGNOSIS — E785 Hyperlipidemia, unspecified: Secondary | ICD-10-CM

## 2013-03-19 DIAGNOSIS — D649 Anemia, unspecified: Secondary | ICD-10-CM | POA: Diagnosis present

## 2013-03-19 DIAGNOSIS — L97809 Non-pressure chronic ulcer of other part of unspecified lower leg with unspecified severity: Secondary | ICD-10-CM | POA: Diagnosis present

## 2013-03-19 DIAGNOSIS — R609 Edema, unspecified: Secondary | ICD-10-CM | POA: Diagnosis present

## 2013-03-19 DIAGNOSIS — A4901 Methicillin susceptible Staphylococcus aureus infection, unspecified site: Secondary | ICD-10-CM | POA: Diagnosis present

## 2013-03-19 DIAGNOSIS — D696 Thrombocytopenia, unspecified: Secondary | ICD-10-CM

## 2013-03-19 DIAGNOSIS — E119 Type 2 diabetes mellitus without complications: Secondary | ICD-10-CM | POA: Diagnosis present

## 2013-03-19 DIAGNOSIS — L02419 Cutaneous abscess of limb, unspecified: Principal | ICD-10-CM | POA: Diagnosis present

## 2013-03-19 DIAGNOSIS — L0291 Cutaneous abscess, unspecified: Secondary | ICD-10-CM

## 2013-03-19 DIAGNOSIS — I824Y9 Acute embolism and thrombosis of unspecified deep veins of unspecified proximal lower extremity: Secondary | ICD-10-CM | POA: Diagnosis present

## 2013-03-19 DIAGNOSIS — Z862 Personal history of diseases of the blood and blood-forming organs and certain disorders involving the immune mechanism: Secondary | ICD-10-CM

## 2013-03-19 DIAGNOSIS — I82409 Acute embolism and thrombosis of unspecified deep veins of unspecified lower extremity: Secondary | ICD-10-CM

## 2013-03-19 DIAGNOSIS — Z8673 Personal history of transient ischemic attack (TIA), and cerebral infarction without residual deficits: Secondary | ICD-10-CM

## 2013-03-19 DIAGNOSIS — Z7982 Long term (current) use of aspirin: Secondary | ICD-10-CM

## 2013-03-19 DIAGNOSIS — I10 Essential (primary) hypertension: Secondary | ICD-10-CM | POA: Diagnosis present

## 2013-03-19 DIAGNOSIS — Z820 Family history of epilepsy and other diseases of the nervous system: Secondary | ICD-10-CM

## 2013-03-19 HISTORY — DX: Acute embolism and thrombosis of unspecified deep veins of unspecified lower extremity: I82.409

## 2013-03-19 HISTORY — DX: Essential (primary) hypertension: I10

## 2013-03-19 HISTORY — DX: Cellulitis of unspecified part of limb: L03.119

## 2013-03-19 HISTORY — DX: Cellulitis of unspecified part of limb: L02.419

## 2013-03-19 HISTORY — DX: Cutaneous abscess of limb, unspecified: L02.419

## 2013-03-19 LAB — CBC WITH DIFFERENTIAL/PLATELET
Basophils Absolute: 0 10*3/uL (ref 0.0–0.1)
Basophils Relative: 1 % (ref 0–1)
EOS ABS: 0 10*3/uL (ref 0.0–0.7)
EOS PCT: 1 % (ref 0–5)
HEMATOCRIT: 31.9 % — AB (ref 39.0–52.0)
HEMOGLOBIN: 10.6 g/dL — AB (ref 13.0–17.0)
LYMPHS ABS: 0.7 10*3/uL (ref 0.7–4.0)
LYMPHS PCT: 18 % (ref 12–46)
MCH: 30.7 pg (ref 26.0–34.0)
MCHC: 33.2 g/dL (ref 30.0–36.0)
MCV: 92.5 fL (ref 78.0–100.0)
MONOS PCT: 6 % (ref 3–12)
Monocytes Absolute: 0.3 10*3/uL (ref 0.1–1.0)
Neutro Abs: 2.9 10*3/uL (ref 1.7–7.7)
Neutrophils Relative %: 74 % (ref 43–77)
PLATELETS: 83 10*3/uL — AB (ref 150–400)
RBC: 3.45 MIL/uL — ABNORMAL LOW (ref 4.22–5.81)
RDW: 14.6 % (ref 11.5–15.5)
WBC: 4 10*3/uL (ref 4.0–10.5)

## 2013-03-19 LAB — COMPREHENSIVE METABOLIC PANEL
ALK PHOS: 74 U/L (ref 39–117)
ALT: 38 U/L (ref 0–53)
AST: 47 U/L — ABNORMAL HIGH (ref 0–37)
Albumin: 2.9 g/dL — ABNORMAL LOW (ref 3.5–5.2)
BUN: 18 mg/dL (ref 6–23)
CALCIUM: 9.3 mg/dL (ref 8.4–10.5)
CO2: 22 meq/L (ref 19–32)
Chloride: 102 mEq/L (ref 96–112)
Creatinine, Ser: 0.98 mg/dL (ref 0.50–1.35)
GFR calc non Af Amer: 79 mL/min — ABNORMAL LOW (ref >90)
GLUCOSE: 193 mg/dL — AB (ref 70–99)
Potassium: 4.8 mEq/L (ref 3.7–5.3)
Sodium: 138 mEq/L (ref 137–147)
TOTAL PROTEIN: 6.7 g/dL (ref 6.0–8.3)
Total Bilirubin: 0.7 mg/dL (ref 0.3–1.2)

## 2013-03-19 LAB — PROTIME-INR
INR: 1.24 (ref 0.00–1.49)
Prothrombin Time: 15.3 seconds — ABNORMAL HIGH (ref 11.6–15.2)

## 2013-03-19 LAB — APTT: APTT: 27 s (ref 24–37)

## 2013-03-19 LAB — GLUCOSE, CAPILLARY: Glucose-Capillary: 116 mg/dL — ABNORMAL HIGH (ref 70–99)

## 2013-03-19 MED ORDER — ALUM & MAG HYDROXIDE-SIMETH 200-200-20 MG/5ML PO SUSP: 30.0000 mL | Freq: Four times a day (QID) | ORAL | Status: DC | PRN

## 2013-03-19 MED ORDER — OXYCODONE HCL 5 MG PO TABS: 5.0000 mg | ORAL_TABLET | ORAL | Status: DC | PRN

## 2013-03-19 MED ORDER — ONDANSETRON HCL 4 MG/2ML IJ SOLN: 4.0000 mg | Freq: Four times a day (QID) | INTRAMUSCULAR | Status: DC | PRN

## 2013-03-19 MED ORDER — HEPARIN (PORCINE) IN NACL 100-0.45 UNIT/ML-% IJ SOLN
1400.0000 [IU]/h | INTRAMUSCULAR | Status: DC
  Administered 2013-03-19: 1400 [IU]/h via INTRAVENOUS
  Filled 2013-03-19 (×2): qty 250

## 2013-03-19 MED ORDER — SODIUM CHLORIDE 0.9 % IV SOLN
INTRAVENOUS | Status: DC
  Administered 2013-03-20: 75 mL/h via INTRAVENOUS
  Administered 2013-03-21 – 2013-03-22 (×3): via INTRAVENOUS

## 2013-03-19 MED ORDER — INSULIN ASPART 100 UNIT/ML ~~LOC~~ SOLN
0.0000 [IU] | Freq: Three times a day (TID) | SUBCUTANEOUS | Status: DC
  Administered 2013-03-20 – 2013-03-21 (×4): 1 [IU] via SUBCUTANEOUS
  Administered 2013-03-22 (×2): 2 [IU] via SUBCUTANEOUS
  Administered 2013-03-22 – 2013-03-23 (×2): 1 [IU] via SUBCUTANEOUS
  Administered 2013-03-23: 2 [IU] via SUBCUTANEOUS
  Administered 2013-03-23: 1 [IU] via SUBCUTANEOUS

## 2013-03-19 MED ORDER — ACETAMINOPHEN 650 MG RE SUPP: 650.0000 mg | Freq: Four times a day (QID) | RECTAL | Status: DC | PRN

## 2013-03-19 MED ORDER — ONDANSETRON HCL 4 MG PO TABS: 4.0000 mg | ORAL_TABLET | Freq: Four times a day (QID) | ORAL | Status: DC | PRN

## 2013-03-19 MED ORDER — GADOBENATE DIMEGLUMINE 529 MG/ML IV SOLN
15.0000 mL | Freq: Once | INTRAVENOUS | Status: AC | PRN
  Administered 2013-03-19: 15 mL via INTRAVENOUS

## 2013-03-19 MED ORDER — ZOLPIDEM TARTRATE 5 MG PO TABS: 5.0000 mg | ORAL_TABLET | Freq: Every evening | ORAL | Status: DC | PRN

## 2013-03-19 MED ORDER — VANCOMYCIN HCL IN DEXTROSE 1-5 GM/200ML-% IV SOLN
1000.0000 mg | Freq: Once | INTRAVENOUS | Status: AC
  Administered 2013-03-19: 1000 mg via INTRAVENOUS
  Filled 2013-03-19 (×2): qty 200

## 2013-03-19 MED ORDER — INSULIN ASPART 100 UNIT/ML ~~LOC~~ SOLN
0.0000 [IU] | Freq: Every day | SUBCUTANEOUS | Status: DC
Start: 2013-03-19 — End: 2013-03-24

## 2013-03-19 MED ORDER — SODIUM CHLORIDE 0.9 % IV SOLN
Freq: Once | INTRAVENOUS | Status: AC
  Administered 2013-03-19: 19:00:00 via INTRAVENOUS

## 2013-03-19 MED ORDER — ACETAMINOPHEN 325 MG PO TABS: 650.0000 mg | ORAL_TABLET | Freq: Four times a day (QID) | ORAL | Status: DC | PRN

## 2013-03-19 MED ORDER — HYDROMORPHONE HCL PF 1 MG/ML IJ SOLN: 0.5000 mg | INTRAMUSCULAR | Status: DC | PRN

## 2013-03-19 MED ORDER — VANCOMYCIN HCL IN DEXTROSE 1-5 GM/200ML-% IV SOLN
1000.0000 mg | Freq: Two times a day (BID) | INTRAVENOUS | Status: DC
  Administered 2013-03-20 – 2013-03-23 (×7): 1000 mg via INTRAVENOUS
  Filled 2013-03-19 (×8): qty 200

## 2013-03-19 NOTE — ED Notes (Signed)
Pt still at MRI. Unable to give medications on time.

## 2013-03-19 NOTE — Progress Notes (Signed)
Patient ID: [name], male   DOB: Sep 23, 1940, 73 y.o.   MRN: [id number]  Chief Complaint  Patient presents with  . Follow-up    cellulitis/abscess right lower leg    HPI [name] is a 73 y.o. male.  He is referred to the urgent office today because of a nonhealing infection of the right lower leg. He is sent over by Dr. Alcus Dad.  This patient is a very pleasant gentleman with a huge number of comorbidities. 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 More recently he was found to have a right popliteal vein thrombosis and has been on Lovenox and Coumadin since 03/08/2013. He is also chronically on Xaralto. 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 Diabetes. Hypertension. He has had a cerebrovascular accident. He has bilateral carotid stenosis. He has polymyalgia rheumatica.   3 weeks ago he developed a rash in his right thigh which has resolved. Dr. Maudie Mercury told him it might be shingles. 2 weeks ago he developed swelling and tenderness and pain and erythema of the right lower extremity pretibial area and catheter. He's been on oral antibiotics. Doppler ultrasound of the right lower extremity on January 19 showed acute right popliteal vein thrombosis. He has been on Coumadin since that time. He has been on antibiotics for at least 10 days, and according to his medication list he is on Augmentin and doxycycline and acyclovir.  He states that the swelling is a little bit less but it is still Restoril tender and has now developed blisters and drainage and of the posterior calf. Dr. Maudie Mercury saw him today and referred him for urgent evaluation. HPI  Past Medical History  Diagnosis Date  . Diabetes mellitus 66  . Carotid artery occlusion   . CVA (cerebral vascular accident)     TIA history  . 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 Leukopenia 08/11/2012  . Mesenteric venous thrombosis 1996  . Blood transfusion without reported  diagnosis   . Clotting disorder     Past Surgical History  Procedure Laterality Date  . Eye surgery Right Jun 26, 2012    Cataract  . Eye surgery Left July 27, 2012    Cataract    Family History  Problem Relation Age of Onset  . Alzheimer's disease Father   . Cancer Sister   . Diabetes Sister   . Deep vein thrombosis Sister     Varicose vein  . Heart disease Mother     Social History History  Substance Use Topics  . Smoking status: Never Smoker   . Smokeless tobacco: Never Used  . Alcohol Use: No    No Known Allergies  Current Outpatient Prescriptions  Medication Sig Dispense Refill  . amoxicillin-clavulanate (AUGMENTIN) 125-31.25 MG/5ML suspension Take 125 mg by mouth 2 (two) times daily.      . Ascorbic Acid (VITAMIN C WITH ROSE HIPS) 1000 MG tablet Take 1,000 mg by mouth daily.      [name] aspirin 81 MG tablet Take 81 mg by mouth daily.        [name] atorvastatin (LIPITOR) 40 MG tablet Take 40 mg by mouth daily.      [name] doxycycline (VIBRAMYCIN) 100 MG capsule Take 100 mg by mouth 2 (two) times daily.      [name] glipiZIDE (GLUCOTROL) 10 MG tablet Take 10 mg by mouth 2 (two) times daily before a meal.        . losartan (COZAAR) 25 MG  tablet Take 25 mg by mouth daily.      . metFORMIN (GLUCOPHAGE) 500 MG tablet Take 500 mg by mouth 2 (two) times daily with a meal.        . Omega-3 Fat Ac-Cholecalciferol (MINICAPS VITAMIN-D/OMEGA-3) [phone] MG-UNIT CAPS Take by mouth. Vit D 3  1000 One tab daily      . saxagliptin HCl (ONGLYZA) 5 MG TABS tablet Take 5 mg by mouth daily.      . traMADol (ULTRAM) 50 MG tablet Take by mouth every 6 (six) hours as needed.      . warfarin (COUMADIN) 2 MG tablet Take 2 mg by mouth daily.       No current facility-administered medications for this visit.    Review of Systems Review of Systems  Constitutional: Negative for fever, chills and unexpected weight change.  HENT: Negative for congestion, hearing loss, sore throat, trouble swallowing and voice  change.   Eyes: Negative for visual disturbance.  Respiratory: Negative for cough and wheezing.   Cardiovascular: Negative for chest pain, palpitations and leg swelling.  Gastrointestinal: Negative for nausea, vomiting, abdominal pain, diarrhea, constipation, blood in stool, abdominal distention, anal bleeding and rectal pain.  Genitourinary: Negative for hematuria and difficulty urinating.  Musculoskeletal: Positive for myalgias. Negative for arthralgias.  Skin: Positive for color change, rash and wound.  Neurological: Negative for seizures, syncope, weakness and headaches.  Hematological: Negative for adenopathy. Does not bruise/bleed easily.  Psychiatric/Behavioral: Negative for confusion.    Blood pressure 160/70, pulse 92, temperature 97.9 F (36.6 C), temperature source Oral, resp. rate 15, height 5' 10"  (1.778 m), weight 184 lb (83.462 kg).  Physical Exam Physical Exam  Constitutional: He is oriented to person, place, and time. He appears well-developed and well-nourished. No distress.  HENT:  Head: Normocephalic.  Nose: Nose normal.  Mouth/Throat: No oropharyngeal exudate.  Eyes: Conjunctivae and EOM are normal. Pupils are equal, round, and reactive to light. Right eye exhibits no discharge. Left eye exhibits no discharge. No scleral icterus.  Neck: Normal range of motion. Neck supple. No JVD present. No tracheal deviation present. No thyromegaly present.  No scars.  Cardiovascular: Normal rate, regular rhythm, normal heart sounds and intact distal pulses.   No murmur heard. Pulmonary/Chest: Effort normal and breath sounds normal. No stridor. No respiratory distress. He has no wheezes. He has no rales. He exhibits no tenderness.  Abdominal: Soft. Bowel sounds are normal. He exhibits no distension and no mass. There is no tenderness. There is no rebound and no guarding.  No scars or hernia.  Musculoskeletal: Normal range of motion. He exhibits no edema and no tenderness.  The  right lower extremity reveals circumferential erythema and skin thickening starting about 3 inches below the knee and extending down to the ankle. This is somewhat tender. On the posterior calf there is swelling and multiple draining blisters  and it is tender and a little bit firm but not fluctuant. No purulence came out. It looks like Silvadene on the skin. He has excellent dorsalis pedis pulse.  Lymphadenopathy:    He has no cervical adenopathy.  Neurological: He is alert and oriented to person, place, and time. He has normal reflexes. Coordination normal.  Skin: Skin is warm and dry. No rash noted. He is not diaphoretic. No erythema. No pallor.  Psychiatric: He has a normal mood and affect. His behavior is normal. Judgment and thought content normal.    Data Reviewed Plan work. Recent office note from Dr. Maudie Mercury.  Assessment    Moderately severe cellulitis of right lower extremity. Failed to resolve following two-week of antibiotics. Now with posterior skin breakdown. 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 I have discussed his medical and surgical problems with Dr. Eulis Foster in the ED. I have recommended admission to medicine with surgical consult.   I have discussed his history, physical findings, and care plan with Dr. Stark Klein, who is on-call for [facility] surgery. She will  Evaluate patient this afternoon  Probably he needs to be taken off of his Coumadin, placed on a heparin drip, started on  broad-spectrum antibiotics, and have an MRI of the right lower extremity.  He may come to surgical exploration or debridement, but not acutely and this would depend on findings of the MRI.  I discussed this with the patient and his wife and they are in agreement with this plan.       Emmory Solivan M 03/19/2013, 3:24 PM

## 2013-03-19 NOTE — H&P (Addendum)
[facility] History and Physical  [name] KVQ:[id number] DOB: 15-Apr-1940 DOA: 03/19/2013  Referring physician:  EDP PCP: Jani Gravel, MD  Specialists:   Chief Complaint:  Worsening Redness and swelling of Right leg  HPI: [name] is a 73 y.o. male  with a history of ITP and recent DVT of the RLE who was sent to the ED from the Office of General Surgeon Dr. Dalbert Batman due to worsening redness and swelling of the RLE despite outpatient antibiotic rx of Augmentin.   He was sent to the ED for an MRI of the RLE, and for further evalauation and treatment.   He began to have redness of his calf a bout 10 days ago, which continued to worsen, and then began to have blisters erupt  4 days ago.   Of note, he had an outpatient Ultrasound and was diagnosed with A DVT of the Right popliteal vein on 03/08/2013 and was placed on Lovenox and then Coumadin Rx.    His platelet level is 83 on admission.      Review of Systems:  Constitutional: No weight loss, night sweats, Fevers, Chills, Fatigue or Generalized Weakness HEENT: No headaches, Difficulty swallowing,Tooth/dental problems,Sore throat,  No sneezing, itching, ear ache, nasal congestion, post nasal drip,  Cardio-vascular:  No chest pain, Orthopnea, PND, Edema in lower extremities, Anasarca, dizziness, palpitations  Resp: No shortness of breath, DOE. No cough, No hemoptysis, No wheezing, No chest wall deformity GI: No heartburn, indigestion, abdominal pain, nausea, vomiting, diarrhea, change in bowel habits, loss of appetite  GU: no dysuria, change in color of urine, no urgency or frequency. No flank pain.  Musculoskeletal: +  Inflammation of Posterior RLE,  No joint pain or swelling. No decreased range of motion. No back pain.  Neurologic: No syncope, No Seizures, Muscle Weakness, Paresthesia, Vision disturbance or Loss, No Diplopia, No Vertigo, No Difficulty Walking,  Skin: + Ulcers on right Calf,   Psych: No change in mood or affect. No  depression or anxiety. No memory loss. No confusion or hallucinations   Past Medical History  Diagnosis Date  . Diabetes mellitus 66  . Carotid artery occlusion   . CVA (cerebral vascular accident)     TIA history  . 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 Leukopenia 08/11/2012  . Mesenteric venous thrombosis 1996  . Blood transfusion without reported diagnosis   . Clotting disorder      Past Surgical History  Procedure Laterality Date  . Eye surgery Right Jun 26, 2012    Cataract  . Eye surgery Left July 27, 2012    Cataract    Prior to Admission medications   Medication Sig Start Date End Date Taking? Authorizing Provider  amoxicillin-clavulanate (AUGMENTIN) 875-125 MG per tablet Take 1 tablet by mouth 2 (two) times daily. For 10 days.   Yes Historical Provider, MD  aspirin 81 MG tablet Take 81 mg by mouth daily.     Yes Historical Provider, MD  atorvastatin (LIPITOR) 40 MG tablet Take 40 mg by mouth every evening.    Yes Historical Provider, MD  doxycycline (VIBRAMYCIN) 100 MG capsule Take 100 mg by mouth 2 (two) times daily. For 10 days   Yes Historical Provider, MD  glipiZIDE (GLUCOTROL) 10 MG tablet Take 10 mg by mouth 2 (two) times daily before a meal.     Yes Historical Provider, MD  losartan (COZAAR) 25 MG tablet Take 25 mg by mouth daily.   Yes Historical Provider, MD  metFORMIN (  GLUCOPHAGE) 1000 MG tablet Take 1,000 mg by mouth 2 (two) times daily with a meal.   Yes Historical Provider, MD  saxagliptin HCl (ONGLYZA) 5 MG TABS tablet Take 5 mg by mouth daily.   Yes Historical Provider, MD  traMADol (ULTRAM) 50 MG tablet Take by mouth every 6 (six) hours as needed.   Yes Historical Provider, MD  warfarin (COUMADIN) 2 MG tablet Take 2 mg by mouth daily.   Yes Historical Provider, MD     No Known Allergies   Social History:  reports that he has never smoked. He has never used smokeless tobacco. He reports that he does not drink alcohol or use illicit  drugs.     Family History  Problem Relation Age of Onset  . Alzheimer's disease Father   . Cancer Sister   . Diabetes Sister   . Deep vein thrombosis Sister     Varicose vein  . Heart disease Mother        Physical Exam:  GEN:  Pleasant Well nourished and Well Developed Elderly  73 y.o. Caucasian male  examined  and in no acute distress; cooperative with exam Filed Vitals:   03/19/13 2023 03/19/13 2133  BP: 121/65 133/54  Pulse: 79 82  Temp: 99.1 F (37.3 C) 97.9 F (36.6 C)  TempSrc: Oral Oral  Resp: 16 16  Height:  5' 10"  (1.778 m)  Weight:  82.872 kg (182 lb 11.2 oz)  SpO2: 97% 97%   Blood pressure 133/54, pulse 82, temperature 97.9 F (36.6 C), temperature source Oral, resp. rate 16, height 5' 10"  (1.778 m), weight 82.872 kg (182 lb 11.2 oz), SpO2 97.00%. PSYCH: He is alert and oriented x4; does not appear anxious does not appear depressed; affect is normal HEENT: Normocephalic and Atraumatic, Mucous membranes pink; PERRLA; EOM intact; Fundi:  Benign;  No scleral icterus, Nares: Patent, Oropharynx: Clear, Fair Dentition, Neck:  FROM, no cervical lymphadenopathy nor thyromegaly or carotid bruit; no JVD; Breasts:: Not examined CHEST WALL: No tenderness CHEST: Normal respiration, clear to auscultation bilaterally HEART: Regular rate and rhythm; no murmurs rubs or gallops BACK: No kyphosis or scoliosis; no CVA tenderness ABDOMEN: Positive Bowel Sounds, soft non-tender; no masses, no organomegaly. Rectal Exam: Not done EXTREMITIES: 3+Edema and Erythema of the Distal RLE,   2 Ulcerated Eruptions on posterior /Calf Area,   No no ulcerations, cyanosis, clubbing or edema of the LLE. Genitalia: not examined PULSES: 2+ and symmetric SKIN: Normal hydration no rash or ulceration CNS: Neurologic Examination: AXOX3,  CNs:   Intact,Sensory and Motor Intact,  Cerebellatr Fxn Intact,   Gait Not Assessed.    Vascular: pulses palpable throughout    Labs on Admission:  Basic  Metabolic Panel:  Recent Labs Lab 03/19/13 1650  NA 138  K 4.8  CL 102  CO2 22  GLUCOSE 193*  BUN 18  CREATININE 0.98  CALCIUM 9.3   Liver Function Tests:  Recent Labs Lab 03/19/13 1650  AST 47*  ALT 38  ALKPHOS 74  BILITOT 0.7  PROT 6.7  ALBUMIN 2.9*   No results found for this basename: LIPASE, AMYLASE,  in the last 168 hours No results found for this basename: AMMONIA,  in the last 168 hours CBC:  Recent Labs Lab 03/19/13 1650  WBC 4.0  NEUTROABS 2.9  HGB 10.6*  HCT 31.9*  MCV 92.5  PLT 83*   Cardiac Enzymes: No results found for this basename: CKTOTAL, CKMB, CKMBINDEX, TROPONINI,  in the last 168 hours  BNP (last 3 results) No results found for this basename: PROBNP,  in the last 8760 hours CBG: No results found for this basename: GLUCAP,  in the last 168 hours  Radiological Exams on Admission: No results found.    Assessment/Plan:   73 y.o. male with  Principal Problem:   Cellulitis Active Problems:   ITP (idiopathic thrombocytopenic purpura)   Cellulitis and abscess of leg   Anemia   Diabetes mellitus    DVT RLE    1.   Cellulitis of RLE-  MRI done of RLE, General Surgery Following, Place on IV Vancomycin,  2.   ITP- PLTs  = 83 on admit, follow trend, Followed by Hematologist Dr. Beryle Beams.    3.   Anemia-   Check Anemia panel.   Follow Trend.     4.   DVT RLE- Dx on 01/19,  Had been on Lovenox, then Coumadin Now on IV Heparin While Inpatient.    5.   DM2-  Monitor Glucose levels, check HbA1C, and SSI coverage PRN.       Code Status:    FULL CODE    Family Communication:    Wife at Bedside Disposition Plan:   Inpatient   Time spent:   Theressa Millard [facility] Pager [phone]  If 7PM-7AM, please contact night-coverage [email or web address] Password [facility] 03/19/2013, 9:38 PM

## 2013-03-19 NOTE — Progress Notes (Signed)
ANTICOAGULATION CONSULT NOTE - Initial Consult  Pharmacy Consult for Heparin Indication: VTE treatment  No Known Allergies  Patient Measurements:   Recent Ht/Wt:  83.5kg and 178 cm Heparin Dosing Weight:  Vital Signs: Temp: 97.9 F (36.6 C) (01/30 1454) Temp src: Oral (01/30 1454) BP: 160/70 mmHg (01/30 1454) Pulse Rate: 92 (01/30 1454)  Labs:  Recent Labs  03/19/13 1650  HGB 10.6*  HCT 31.9*  PLT 83*  APTT 27  LABPROT 15.3*  INR 1.24  CREATININE 0.98    The CrCl is unknown because both a height and weight (above a minimum accepted value) are required for this calculation.   Medical History: Past Medical History  Diagnosis Date  . Diabetes mellitus 66  . Carotid artery occlusion   . CVA (cerebral vascular accident)     TIA history  . 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 Leukopenia 08/11/2012  . Mesenteric venous thrombosis 1996  . Blood transfusion without reported diagnosis   . Clotting disorder     Medications:  Scheduled:   Infusions:  . sodium chloride    . vancomycin      Assessment: 86 yoM presenting to [facility] ED on 1/30 with worsening redness and swelling of RLE concerning for cellulitis (failed outpatient therapy of Doxy and Augmentin).  PMH significant for DM, HTN, CAD, CVA, ITP, anticoagulation with warfarin (recently started 03/08/13) for popliteal thrombosis.  Pharmacy is consulted to dose Heparin IV for recent VTE.  Warfarin PTA: 26m PO daily.  Last dose 03/19/13.  INR 1.24, subtherapeutic.  APTT 27  CBC: Hgb 10.6 and Plt 83  SCr 0.98 with CrCl ~ 75 ml/min  Goal of Therapy:  Heparin level 0.3-0.7 units/ml Monitor platelets by anticoagulation protocol: Yes   Plan:   Start heparin IV infusion at 1400 units/hr (14 ml/hr)  Heparin level 8 hours after starting  Daily heparin level and CBC  Continue to monitor H&H and platelets   CGretta ArabPharmD, BCPS Pager [phone]/30/2015 6:47 PM

## 2013-03-19 NOTE — Patient Instructions (Signed)
You have a persistent infection with blisters and skin breakdown of your right leg. We are concerned since this has not significantly resolved after 2 weeks of appropriate treatment.  We cannot perform any surgical procedure in the office today because you are on Coumadin and xaralto  Please proceed to the [facility] long emergency room. I will arrange for you to be seen by the surgical service and be admitted by the medical service.  You will need intravenous antibiotics  You will need an MRI of the right leg to make sure there is not an abscess.

## 2013-03-19 NOTE — ED Notes (Addendum)
Patient transported to MRI 

## 2013-03-19 NOTE — Progress Notes (Signed)
ANTIBIOTIC CONSULT NOTE - INITIAL  Pharmacy Consult for Vancomycin Indication: cellulitis  No Known Allergies  Patient Measurements:   Recent Ht/Wt: 83.5kg and 178 cm  Vital Signs: Temp: 97.9 F (36.6 C) (01/30 1454) Temp src: Oral (01/30 1454) BP: 160/70 mmHg (01/30 1454) Pulse Rate: 92 (01/30 1454)  Labs:  Recent Labs  03/19/13 1650  WBC 4.0  HGB 10.6*  PLT 83*  CREATININE 0.98   The CrCl is unknown because both a height and weight (above a minimum accepted value) are required for this calculation.   Microbiology: No results found for this or any previous visit (from the past 720 hour(s)).  Medical History: Past Medical History  Diagnosis Date  . Diabetes mellitus 66  . Carotid artery occlusion   . CVA (cerebral vascular accident)     TIA history  . 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 Leukopenia 08/11/2012  . Mesenteric venous thrombosis 1996  . Blood transfusion without reported diagnosis   . Clotting disorder     Medications:  Anti-infectives   Start     Dose/Rate Route Frequency Ordered Stop   03/19/13 1700  vancomycin (VANCOCIN) IVPB 1000 mg/200 mL premix     1,000 mg 200 mL/hr over 60 Minutes Intravenous  Once 03/19/13 1642       Assessment: 12 yoM presenting to [facility] ED on 1/30 with worsening redness and swelling of RLE concerning for cellulitis with several open lesions.  He has failed outpatient therapy of Doxy and Augmentin prior to admission. PMH significant for DM, HTN, CAD, CVA, ITP, anticoagulation with warfarin for recent popliteal thrombosis. Pharmacy is consulted to dose Vancomycin.  Tmax: afeb  WBCs: 4  Renal: SCr 0.98 with CrCl ~ 75 ml/min  Goal of Therapy:  Vancomycin trough level 15-20 mcg/ml  Plan:   Vancomycin 1g IV q12h.  Measure Vanc trough at steady state.  Follow up renal fxn and culture results.  Gretta Arab PharmD, BCPS Pager [phone] 03/19/2013 8:06 PM

## 2013-03-19 NOTE — ED Notes (Signed)
Per pt/family-right lower leg cellulitis-sent here by surgeon for IV antibiotics

## 2013-03-19 NOTE — ED Provider Notes (Signed)
Medical screening examination/treatment/procedure(s) were conducted as a shared visit with non-physician practitioner(s) and myself.  I personally evaluated the patient during the encounter.  EKG Interpretation   None       Pt is a 73 y.o. M with history of diabetes, hypertension, CAD, CVA, ITP who has been on anticoagulation since 03/09/11 for a right popliteal vein thrombus who presents emergency department with worsening redness and swelling of his right lower extremity concerning for cellulitis. Patient has been on Augmentin, doxycycline and acyclovir without resolution of symptoms. He was sent by Dr. Dalbert Batman for further evaluation, IV antibiotics, admission and an MRI of his lower extremity.  Exam patient is hemodynamically stable. He has diffuse erythema, induration and pitting edema of his distal right lower extremity with 2+ DP pulses. He has multiple open lesions to his posterior calf with no purulent drainage. Will obtain labs, and pulmonary and admit. Will give IV antibiotics.  [place], DO 03/19/13 1936

## 2013-03-19 NOTE — ED Provider Notes (Signed)
CSN: [id number]     Arrival date & time 03/19/13  1613 History   First MD Initiated Contact with Patient 03/19/13 1625     Chief Complaint  Patient presents with  . Cellulitis   (Consider location/radiation/quality/duration/timing/severity/associated sxs/prior Treatment) Patient is a 73 y.o. male presenting with leg pain. The history is provided by the patient. No language interpreter was used.  Leg Pain Location:  Leg Leg location:  R leg Associated symptoms: no fever   Associated symptoms comment:  The patient is here with his wife at bedside who was sent to ED after evaluation at [facility]. He has had lower extremity redness and swelling, with recent diagnosis (03/08/13) of popliteal thrombosis. He has been on Coumadin for the past 6 days, after initial treatment with Lovenox. He has also completed a 10-day course of keflex without improvement. No fever, SOB, chest pain, N, V, or abdominal pain.   Past Medical History  Diagnosis Date  . Diabetes mellitus 66  . Carotid artery occlusion   . CVA (cerebral vascular accident)     TIA history  . 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 Leukopenia 08/11/2012  . Mesenteric venous thrombosis 1996  . Blood transfusion without reported diagnosis   . Clotting disorder    Past Surgical History  Procedure Laterality Date  . Eye surgery Right Jun 26, 2012    Cataract  . Eye surgery Left July 27, 2012    Cataract   Family History  Problem Relation Age of Onset  . Alzheimer's disease Father   . Cancer Sister   . Diabetes Sister   . Deep vein thrombosis Sister     Varicose vein  . Heart disease Mother    History  Substance Use Topics  . Smoking status: Never Smoker   . Smokeless tobacco: Never Used  . Alcohol Use: No    Review of Systems  Constitutional: Negative for fever and chills.  HENT: Negative.   Respiratory: Negative.  Negative for shortness of breath.   Cardiovascular: Negative.  Negative for  chest pain.  Gastrointestinal: Negative.  Negative for abdominal pain.  Musculoskeletal:       See HPI.  Skin: Positive for color change and wound.  Neurological: Negative.     Allergies  Review of patient's allergies indicates no known allergies.  Home Medications   Current Outpatient Rx  Name  Route  Sig  Dispense  Refill  . amoxicillin-clavulanate (AUGMENTIN) 875-125 MG per tablet   Oral   Take 1 tablet by mouth 2 (two) times daily. For 10 days.         [name] aspirin 81 MG tablet   Oral   Take 81 mg by mouth daily.           [name] atorvastatin (LIPITOR) 40 MG tablet   Oral   Take 40 mg by mouth every evening.          [name] doxycycline (VIBRAMYCIN) 100 MG capsule   Oral   Take 100 mg by mouth 2 (two) times daily. For 10 days         . glipiZIDE (GLUCOTROL) 10 MG tablet   Oral   Take 10 mg by mouth 2 (two) times daily before a meal.           . losartan (COZAAR) 25 MG tablet   Oral   Take 25 mg by mouth daily.         . metFORMIN (GLUCOPHAGE) 1000 MG tablet  Oral   Take 1,000 mg by mouth 2 (two) times daily with a meal.         . saxagliptin HCl (ONGLYZA) 5 MG TABS tablet   Oral   Take 5 mg by mouth daily.         . traMADol (ULTRAM) 50 MG tablet   Oral   Take by mouth every 6 (six) hours as needed.         . warfarin (COUMADIN) 2 MG tablet   Oral   Take 2 mg by mouth daily.          There were no vitals taken for this visit. Physical Exam  Constitutional: He is oriented to person, place, and time. He appears well-developed and well-nourished.  HENT:  Head: Normocephalic.  Neck: Normal range of motion. Neck supple.  Cardiovascular: Normal rate and regular rhythm.   Pulmonary/Chest: Effort normal and breath sounds normal.  Abdominal: Soft. Bowel sounds are normal. There is no tenderness. There is no rebound and no guarding.  Musculoskeletal: Normal range of motion.  Right lower leg discolored circumferentially. There are multiple circular  lesions posteriorly with crusting from drainage. The entire leg is tender. Mild to moderate swelling to calf and ankle. Minimal warmth to touch.  Neurological: He is alert and oriented to person, place, and time.  Skin: Skin is warm and dry. No rash noted.  Psychiatric: He has a normal mood and affect.    ED Course  Procedures (including critical care time) Labs Review Labs Reviewed  CBC WITH DIFFERENTIAL  COMPREHENSIVE METABOLIC PANEL  APTT  PROTIME-INR   Results for orders placed during the hospital encounter of 03/19/13  CBC WITH DIFFERENTIAL      Result Value Range   WBC 4.0  4.0 - 10.5 K/uL   RBC 3.45 (*) 4.22 - 5.81 MIL/uL   Hemoglobin 10.6 (*) 13.0 - 17.0 g/dL   HCT 31.9 (*) 39.0 - 52.0 %   MCV 92.5  78.0 - 100.0 fL   MCH 30.7  26.0 - 34.0 pg   MCHC 33.2  30.0 - 36.0 g/dL   RDW 14.6  11.5 - 15.5 %   Platelets 83 (*) 150 - 400 K/uL   Neutrophils Relative % 74  43 - 77 %   Neutro Abs 2.9  1.7 - 7.7 K/uL   Lymphocytes Relative 18  12 - 46 %   Lymphs Abs 0.7  0.7 - 4.0 K/uL   Monocytes Relative 6  3 - 12 %   Monocytes Absolute 0.3  0.1 - 1.0 K/uL   Eosinophils Relative 1  0 - 5 %   Eosinophils Absolute 0.0  0.0 - 0.7 K/uL   Basophils Relative 1  0 - 1 %   Basophils Absolute 0.0  0.0 - 0.1 K/uL  COMPREHENSIVE METABOLIC PANEL      Result Value Range   Sodium 138  137 - 147 mEq/L   Potassium 4.8  3.7 - 5.3 mEq/L   Chloride 102  96 - 112 mEq/L   CO2 22  19 - 32 mEq/L   Glucose, Bld 193 (*) 70 - 99 mg/dL   BUN 18  6 - 23 mg/dL   Creatinine, Ser 0.98  0.50 - 1.35 mg/dL   Calcium 9.3  8.4 - 10.5 mg/dL   Total Protein 6.7  6.0 - 8.3 g/dL   Albumin 2.9 (*) 3.5 - 5.2 g/dL   AST 47 (*) 0 - 37 U/L   ALT 38  0 -  53 U/L   Alkaline Phosphatase 74  39 - 117 U/L   Total Bilirubin 0.7  0.3 - 1.2 mg/dL   GFR calc non Af Amer 79 (*) >90 mL/min   GFR calc Af Amer >90  >90 mL/min  APTT      Result Value Range   aPTT 27  24 - 37 seconds  PROTIME-INR      Result Value Range    Prothrombin Time 15.3 (*) 11.6 - 15.2 seconds   INR 1.24  0.00 - 1.49     US Venous Img Lower Unilateral Right  03/08/2013   CLINICAL DATA:  Redness right lower extremity.  EXAM: Right LOWER EXTREMITY VENOUS DOPPLER ULTRASOUND  TECHNIQUE: Gray-scale sonography with graded compression, as well as color Doppler and duplex ultrasound, were performed to evaluate the deep venous system from the level of the common femoral vein through the popliteal and proximal calf veins. Spectral Doppler was utilized to evaluate flow at rest and with distal augmentation maneuvers.  COMPARISON:  None.  FINDINGS: Thrombus within deep veins: Thrombus is present and the right popliteal vein.  Compressibility of deep veins:  Normal.  Duplex waveform respiratory phasicity:  Normal.  Duplex waveform response to augmentation:  Normal.  Venous reflux:  None visualized.  Other findings:  None visualized.  IMPRESSION: Right popliteal vein thrombosis.   Electronically Signed   By: Marcello Moores  Register   On: 03/08/2013 14:29   Imaging Review No results found.  EKG Interpretation   None       MDM  No diagnosis found. 1. Right LE cellulitis 2. Thrombocytopenia w/history of ITP 3. Popliteal   He declines any pain medication. He is drinking fluids without difficulty. Chart reviewed, specifically note from Dr. Dalbert Batman. MR-lower leg ordered to evaluate abscess vs osteo, to determine if any need for surgical intervention. IV abx ordered. He is subtherapeutic on his Coumadin. Heparin ordered per pharmacy input. Discussed with [facility] for admission. Dr. Barry Dienes with [facility] to evaluate patient later in hospital.   Dewaine Oats, PA-C 03/20/13 2257

## 2013-03-20 ENCOUNTER — Encounter (HOSPITAL_COMMUNITY): Payer: Self-pay | Admitting: Internal Medicine

## 2013-03-20 DIAGNOSIS — E119 Type 2 diabetes mellitus without complications: Secondary | ICD-10-CM

## 2013-03-20 DIAGNOSIS — L0291 Cutaneous abscess, unspecified: Secondary | ICD-10-CM

## 2013-03-20 DIAGNOSIS — I10 Essential (primary) hypertension: Secondary | ICD-10-CM

## 2013-03-20 DIAGNOSIS — L0390 Cellulitis, unspecified: Secondary | ICD-10-CM

## 2013-03-20 DIAGNOSIS — D649 Anemia, unspecified: Secondary | ICD-10-CM

## 2013-03-20 DIAGNOSIS — Z8673 Personal history of transient ischemic attack (TIA), and cerebral infarction without residual deficits: Secondary | ICD-10-CM

## 2013-03-20 DIAGNOSIS — E785 Hyperlipidemia, unspecified: Secondary | ICD-10-CM | POA: Diagnosis present

## 2013-03-20 DIAGNOSIS — I82409 Acute embolism and thrombosis of unspecified deep veins of unspecified lower extremity: Secondary | ICD-10-CM

## 2013-03-20 HISTORY — DX: Essential (primary) hypertension: I10

## 2013-03-20 LAB — BASIC METABOLIC PANEL
BUN: 16 mg/dL (ref 6–23)
CALCIUM: 8.7 mg/dL (ref 8.4–10.5)
CO2: 24 mEq/L (ref 19–32)
Chloride: 106 mEq/L (ref 96–112)
Creatinine, Ser: 0.95 mg/dL (ref 0.50–1.35)
GFR calc non Af Amer: 80 mL/min — ABNORMAL LOW (ref >90)
Glucose, Bld: 118 mg/dL — ABNORMAL HIGH (ref 70–99)
Potassium: 4.2 mEq/L (ref 3.7–5.3)
Sodium: 140 mEq/L (ref 137–147)

## 2013-03-20 LAB — HEMOGLOBIN A1C
Hgb A1c MFr Bld: 7.3 % — ABNORMAL HIGH (ref <5.7)
Mean Plasma Glucose: 163 mg/dL — ABNORMAL HIGH (ref <117)

## 2013-03-20 LAB — CBC
HCT: 28.2 % — ABNORMAL LOW (ref 39.0–52.0)
Hemoglobin: 9.4 g/dL — ABNORMAL LOW (ref 13.0–17.0)
MCH: 30.7 pg (ref 26.0–34.0)
MCHC: 33.3 g/dL (ref 30.0–36.0)
MCV: 92.2 fL (ref 78.0–100.0)
PLATELETS: 62 10*3/uL — AB (ref 150–400)
RBC: 3.06 MIL/uL — ABNORMAL LOW (ref 4.22–5.81)
RDW: 14.6 % (ref 11.5–15.5)
WBC: 3.2 10*3/uL — AB (ref 4.0–10.5)

## 2013-03-20 LAB — IRON AND TIBC
IRON: 38 ug/dL — AB (ref 42–135)
SATURATION RATIOS: 17 % — AB (ref 20–55)
TIBC: 225 ug/dL (ref 215–435)
UIBC: 187 ug/dL (ref 125–400)

## 2013-03-20 LAB — RETICULOCYTES
RBC.: 3.14 MIL/uL — ABNORMAL LOW (ref 4.22–5.81)
Retic Count, Absolute: 87.9 10*3/uL (ref 19.0–186.0)
Retic Ct Pct: 2.8 % (ref 0.4–3.1)

## 2013-03-20 LAB — VITAMIN B12: VITAMIN B 12: 651 pg/mL (ref 211–911)

## 2013-03-20 LAB — HEPARIN LEVEL (UNFRACTIONATED): Heparin Unfractionated: 0.44 IU/mL (ref 0.30–0.70)

## 2013-03-20 LAB — FERRITIN: Ferritin: 124 ng/mL (ref 22–322)

## 2013-03-20 LAB — GLUCOSE, CAPILLARY
Glucose-Capillary: 136 mg/dL — ABNORMAL HIGH (ref 70–99)
Glucose-Capillary: 115 mg/dL — ABNORMAL HIGH (ref 70–99)
Glucose-Capillary: 113 mg/dL — ABNORMAL HIGH (ref 70–99)
Glucose-Capillary: 186 mg/dL — ABNORMAL HIGH (ref 70–99)

## 2013-03-20 LAB — FOLATE

## 2013-03-20 MED ORDER — LOSARTAN POTASSIUM 25 MG PO TABS
25.0000 mg | ORAL_TABLET | Freq: Every day | ORAL | Status: DC
  Administered 2013-03-20: 25 mg via ORAL
  Filled 2013-03-20 (×2): qty 1

## 2013-03-20 MED ORDER — HEPARIN (PORCINE) IN NACL 100-0.45 UNIT/ML-% IJ SOLN
1650.0000 [IU]/h | INTRAMUSCULAR | Status: DC
  Administered 2013-03-20: 1650 [IU]/h via INTRAVENOUS
  Filled 2013-03-20 (×3): qty 250

## 2013-03-20 MED ORDER — ATORVASTATIN CALCIUM 40 MG PO TABS
40.0000 mg | ORAL_TABLET | Freq: Every evening | ORAL | Status: DC
  Administered 2013-03-20 – 2013-03-23 (×4): 40 mg via ORAL
  Filled 2013-03-20 (×5): qty 1

## 2013-03-20 NOTE — Progress Notes (Addendum)
[facility] PROGRESS NOTE   [name] TMH:[id number] DOB: 1941-02-12 DOA: 03/19/2013 PCP: Jani Gravel, MD  Brief narrative: [name] is an 73 y.o. 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 Dalbert Batman, secondary to worsening erythema and swelling of the right lower extremity despite being treated with Augmentin and doxycycline. Dr. Dalbert Batman noted some blisters and drainage of the posterior calf and sent him to the ER for further evaluation and admission.  Assessment/Plan: Principal Problem:   Cellulitis of right leg Patient was admitted and put on IV vancomycin. MRI obtained 03/19/13 showed asymmetric subcutaneous and popliteal edema consistent with cellulitis. No abscess or deeper infection noted. Surgeon following.  Will send off culture of posterior lower leg ulcer. Active Problems:   History of stroke Aspirin currently on hold since he is on a heparin drip.   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 Platelet count stable.   Normocytic Anemia Appears to be relatively new. Hemoglobin 12.9 mg/dL 01/19/13. Check an anemia panel.   Diabetes mellitus Currently on insulin sensitive SSI. Glucotrol and saxagliptin on hold.   DVT Coumadin on hold. On a heparin drip.   Hyperlipidemia Resume Lipitor.   Hypertension Cozaar currently on hold. Resume.  Code Status: Full. Family Communication: Wife updated at the bedside. Disposition Plan: Home when stable.   IV access:  Peripheral IV  Medical Consultants:  Surgery  Other Consultants:  None  Anti-infectives:  Vancomycin 03/19/13--->  HPI/Subjective: [name] tells me he thinks his right posterior  calf is less swollen and painful today. Still a bit sore. Denies nausea, vomiting, diarrhea, dyspnea and cough.  Objective: Filed Vitals:   03/19/13 2023 03/19/13 2133 03/20/13 0012 03/20/13 0512  BP: 121/65 133/54 129/56 131/58  Pulse: 79 82 80 81  Temp: 99.1 F (37.3 C) 97.9 F (36.6 C) 98.2 F (36.8 C) 98.2 F (36.8 C)  TempSrc: Oral Oral Oral Oral  Resp: 16 16 14 16   Height:  5' 10"  (1.778 m)    Weight:  82.872 kg (182 lb 11.2 oz)    SpO2: 97% 97% 98% 96%    Intake/Output Summary (Last 24 hours) at 03/20/13 0806 Last data filed at 03/20/13 9211  Gross per 24 hour  Intake    240 ml  Output    700 ml  Net   -460 ml    Exam: Gen:  NAD Cardiovascular:  RRR, No M/R/G Respiratory:  Lungs CTAB Gastrointestinal:  Abdomen soft, NT/ND, + BS Extremities:  Posterior calf swollen, slightly erythematous, with oozing ulcer  Data Reviewed: Basic Metabolic Panel:  Recent Labs Lab 03/19/13 1650 03/20/13 0416  NA 138 140  K 4.8 4.2  CL 102 106  CO2 22 24  GLUCOSE 193* 118*  BUN 18 16  CREATININE 0.98 0.95  CALCIUM 9.3 8.7   GFR Estimated Creatinine Clearance: 70.4 ml/min (by C-G formula based on Cr of 0.95). Liver Function Tests:  Recent Labs Lab 03/19/13 1650  AST 47*  ALT 38  ALKPHOS 74  BILITOT 0.7  PROT 6.7  ALBUMIN 2.9*   Coagulation profile  Recent Labs Lab 03/19/13 1650  INR 1.24    CBC:  Recent Labs Lab 03/19/13 1650 03/20/13 0416  WBC 4.0 3.2*  NEUTROABS 2.9  --  HGB 10.6* 9.4*  HCT 31.9* 28.2*  MCV 92.5 92.2  PLT 83* 62*   CBG:  Recent Labs Lab 03/19/13 2131 03/20/13 0734  GLUCAP 116* 136*   Anemia work up No results found for this basename: VITAMINB12, FOLATE, FERRITIN, TIBC, IRON, RETICCTPCT,  in the last 72 hours Microbiology No results found for this or any previous visit (from the past 240 hour(s)).   Procedures and Diagnostic Studies: Mr Tibia Fibula Right W Wo Contrast  03/20/2013   CLINICAL DATA:  Right lower leg  nonhealing wound. Diabetes and hypertension. Recent right popliteal vein thrombosis.  EXAM: MRI OF LOWER RIGHT EXTREMITY WITHOUT AND WITH CONTRAST  TECHNIQUE: Multiplanar, multisequence MR imaging of the lower right extremity was performed both before and after administration of intravenous contrast.  CONTRAST:  59m MULTIHANCE GADOBENATE DIMEGLUMINE 529 MG/ML IV SOLN  COMPARISON:  [place]VENOUS IMG LOWER  [facility]I *R* dated 03/08/2013  FINDINGS: Asymmetric edema tracks in the right popliteal space and superficial to the gastrocnemius musculature. There is low grade edema distally within the medial head of the right gastrocnemius.  Asymmetric subcutaneous edema is present in the right calf and tracking along the superficial fascia margin all the way to the ankle.  No significant osseous abnormality of the right tibia or fibula.  Lobular 3.3 by 1.6 by 1.7 cm focus of high T2 signal and adjacent skin disruption in the right posteromedial mid calf noted on image 33 of series 7. This does not appreciably enhance. No drainable abscess.  IMPRESSION: 1. Asymmetric subcutaneous and popliteal edema with edema tracking along the fascia planes in the right calf. Associated subcutaneous enhancement suggests cellulitis. There may be some low-grade myositis in the medial head of the gastrocnemius muscle distally. Edema tracking along the deep fascia planes is not associated with enhancement and may be due to the recent DVT rather than necessarily being due to fasciitis. No abscess observed. 2. Focus of increased T2 signal and the posteromedial mid catheterization on the right with associated skin irregularity, possibly reflecting the underlying wound. The associated hypoenhancing subcutaneous region may reflect inflammatory phlegmon or a thrombosed superficial varix.   Electronically Signed   By: WSherryl BartersM.D.   On: 03/20/2013 08:45      Scheduled Meds: . insulin aspart  0-5 Units Subcutaneous QHS  . insulin aspart  0-9  Units Subcutaneous TID WC  . vancomycin  1,000 mg Intravenous Q12H   Continuous Infusions: . sodium chloride 75 mL/hr (03/20/13 08366  . heparin 1,650 Units/hr (03/20/13 02947    Time spent: 35 minutes with > 50% of time discussing current diagnostic test results, clinical impression and plan of care.    LOS: 1 day   [name],Joshua  [facility] Pager [phone]   *Please note that the hospitalists switch teams on Wednesdays. Please call the flow manager at [phone] you are having difficulty reaching the hospitalist taking care of this patient as she can update you and provide the most up-to-date pager number of provider caring for the patient. If 8PM-8AM, please contact night-coverage at [email or web address], password [facility] 03/20/2013, 8:06 AM   Information printed out, explained, and given to the patient:  In an effort to keep you and your family informed about your hospital stay, I am providing you with this information sheet. If you or your family have any questions, please do not hesitate to have the nursing staff page me to set up a meeting time.  BJovannie UlibarriPrevette 03/20/2013 1 (Number of days  in the hospital)  Treatment team:  Dr. Jacquelynn Cree, Hospitalist (Internist)  Dr. Fanny Skates (surgeon)  Active Treatment Issues with Plan: Principal Problem:   Skin infection of leg You are being treated with vancomycin (antibiotic).   MRI obtained 03/19/13 showed asymmetric subcutaneous and popliteal edema consistent with cellulitis. No abscess or deeper infection noted. Surgeon following. Active Problems:   History of stroke Aspirin currently on hold since you are on a heparin drip.   ITP (idiopathic thrombocytopenic purpura) Platelet count stable.   Low blood count (anemia) Appears to be relatively new. Hemoglobin 12.9 mg/dL 01/19/13 (currently 9.4). Check an anemia panel.   Diabetes Currently on insulin as needed. Glucotrol and saxagliptin on hold.   Blood clot right  leg Coumadin on hold. On a heparin drip.   High cholesterol On Lipitor.   High blood pressure Cozaar held on admission. Resume.  Most recent blood pressure: 131/58.  Anticipated discharge date: 2-4 days depending on progress.

## 2013-03-20 NOTE — Progress Notes (Signed)
ANTICOAGULATION CONSULT NOTE - Follow Up  Pharmacy Consult for Heparin Indication: VTE treatment  No Known Allergies  Patient Measurements: Height: 5' 10"  (177.8 cm) Weight: 182 lb 11.2 oz (82.872 kg) IBW/kg (Calculated) : 73 Heparin Dosing Weight: 83 kg  Vital Signs: Temp: 98.2 F (36.8 C) (01/31 0512) Temp src: Oral (01/31 0512) BP: 131/58 mmHg (01/31 0512) Pulse Rate: 81 (01/31 0512)  Labs:  Recent Labs  03/19/13 1650 03/20/13 0416  HGB 10.6* 9.4*  HCT 31.9* 28.2*  PLT 83* 62*  APTT 27  --   LABPROT 15.3*  --   INR 1.24  --   HEPARINUNFRC  --  <0.10*  CREATININE 0.98 0.95    Estimated Creatinine Clearance: 70.4 ml/min (by C-G formula based on Cr of 0.95).   Medical History: Past Medical History  Diagnosis Date  . Diabetes mellitus 66  . Carotid artery occlusion   . CVA (cerebral vascular accident)     TIA history  . 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 Leukopenia 08/11/2012  . Mesenteric venous thrombosis 1996  . Blood transfusion without reported diagnosis   . Clotting disorder     Medications:  Scheduled:  . insulin aspart  0-5 Units Subcutaneous QHS  . insulin aspart  0-9 Units Subcutaneous TID WC  . vancomycin  1,000 mg Intravenous Q12H   Infusions:  . sodium chloride 75 mL/hr (03/20/13 0104)  . heparin      Assessment: 51 yoM presenting to [facility] ED on 1/30 with worsening redness and swelling of RLE concerning for cellulitis (failed outpatient therapy of Doxy and Augmentin).  PMH significant for DM, HTN, CAD, CVA, ITP, anticoagulation with warfarin (recently started 03/08/13) for popliteal thrombosis.  Pharmacy is consulted to dose Heparin IV for recent VTE.  Warfarin PTA: 93m PO daily.  Last dose 03/19/13 with subtherapeutic INR of 1.24 upon admission  First HL < 0.1 with heparin infusing @ 1400 units/hr.  Spoke with RN who confirmed no issues with infusion or infusion site.  No complications of therapy noted  H/H = 9.4/28.2  with PLTC = 62  Goal of Therapy:  Heparin level 0.3-0.7 units/ml Monitor platelets by anticoagulation protocol: Yes   Plan:   Increase heparin to 1650 units/hr  Heparin level 8 hours after rate increase  Daily heparin level and CBC  Continue to monitor H&H and platelets   LLeone Haven PharmD  03/20/2013 6:11 AM

## 2013-03-20 NOTE — Progress Notes (Signed)
ANTICOAGULATION CONSULT NOTE - Follow Up  Pharmacy Consult for Heparin Indication: VTE treatment  No Known Allergies  Patient Measurements: Height: 5' 10"  (177.8 cm) Weight: 182 lb 11.2 oz (82.872 kg) IBW/kg (Calculated) : 73 Heparin Dosing Weight: 83 kg  Vital Signs: Temp: 98.1 F (36.7 C) (01/31 1430) Temp src: Oral (01/31 1430) BP: 119/52 mmHg (01/31 1430) Pulse Rate: 80 (01/31 1430)  Labs:  Recent Labs  03/19/13 1650 03/20/13 0416 03/20/13 1346  HGB 10.6* 9.4*  --   HCT 31.9* 28.2*  --   PLT 83* 62*  --   APTT 27  --   --   LABPROT 15.3*  --   --   INR 1.24  --   --   HEPARINUNFRC  --  <0.10* 0.44  CREATININE 0.98 0.95  --     Estimated Creatinine Clearance: 70.4 ml/min (by C-G formula based on Cr of 0.95).   Medical History: Past Medical History  Diagnosis Date  . Diabetes mellitus 66  . Carotid artery occlusion   . CVA (cerebral vascular accident)     TIA history  . 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 Leukopenia 08/11/2012  . Mesenteric venous thrombosis 1996  . Blood transfusion without reported diagnosis   . Clotting disorder   . Essential hypertension, benign 03/20/2013  . Cellulitis and abscess of leg 03/19/2013  . DVT (deep venous thrombosis) 03/20/2013    Medications:  Scheduled:  . atorvastatin  40 mg Oral QPM  . insulin aspart  0-5 Units Subcutaneous QHS  . insulin aspart  0-9 Units Subcutaneous TID WC  . losartan  25 mg Oral Daily  . vancomycin  1,000 mg Intravenous Q12H   Infusions:  . sodium chloride 75 mL/hr (03/20/13 9629)  . heparin 1,650 Units/hr (03/20/13 1412)    Assessment: 63 yoM presenting [facility] ED on [facility]/30 with worsening redness and swelling of RLE concerning for cellulitis (failed outpatient therapy of Doxy and Augmentin).  PMH significant for DM, HTN, CAD, CVA, ITP, anticoagulation with warfarin (recently started 03/08/13) for popliteal thrombosis.  Pharmacy is consulted to dose Heparin IV for recent  VTE.  Warfarin PTA: 7m PO daily.  Last dose 03/19/13 with subtherapeutic INR of 1.24 upon admission  First HL < 0.1 with heparin infusing @ 1400 units/hr, Rate increased to 1650 units/hr this morning and corresponding heparin level = 0.44  H/H = 9.4/28.2 with PLTC = 62  Goal of Therapy:  Heparin level 0.3-0.7 units/ml Monitor platelets by anticoagulation protocol: Yes   Plan:   Continue heparin 1650 units/hr  Check confirmatory level with am labs  Daily heparin level and CBC  Continue to monitor H&H and platelets  DDoreene Eland PharmD, BCPS.   Pager: [phone]/31/2015 4:24 PM

## 2013-03-21 LAB — GLUCOSE, CAPILLARY
GLUCOSE-CAPILLARY: 133 mg/dL — AB (ref 70–99)
GLUCOSE-CAPILLARY: 129 mg/dL — AB (ref 70–99)
GLUCOSE-CAPILLARY: 186 mg/dL — AB (ref 70–99)
Glucose-Capillary: 128 mg/dL — ABNORMAL HIGH (ref 70–99)

## 2013-03-21 LAB — CBC
HCT: 28.9 % — ABNORMAL LOW (ref 39.0–52.0)
Hemoglobin: 9.4 g/dL — ABNORMAL LOW (ref 13.0–17.0)
MCH: 30.2 pg (ref 26.0–34.0)
MCHC: 32.5 g/dL (ref 30.0–36.0)
MCV: 92.9 fL (ref 78.0–100.0)
Platelets: 62 10*3/uL — ABNORMAL LOW (ref 150–400)
RBC: 3.11 MIL/uL — AB (ref 4.22–5.81)
RDW: 15 % (ref 11.5–15.5)
WBC: 2.9 10*3/uL — ABNORMAL LOW (ref 4.0–10.5)

## 2013-03-21 LAB — PROTIME-INR
INR: 1.3 (ref 0.00–1.49)
Prothrombin Time: 15.9 seconds — ABNORMAL HIGH (ref 11.6–15.2)

## 2013-03-21 LAB — HEPARIN LEVEL (UNFRACTIONATED)
Heparin Unfractionated: 0.77 IU/mL — ABNORMAL HIGH (ref 0.30–0.70)
Heparin Unfractionated: 0.76 IU/mL — ABNORMAL HIGH (ref 0.30–0.70)

## 2013-03-21 MED ORDER — WARFARIN - PHARMACIST DOSING INPATIENT: Freq: Every day | Status: DC

## 2013-03-21 MED ORDER — HEPARIN (PORCINE) IN NACL 100-0.45 UNIT/ML-% IJ SOLN
1300.0000 [IU]/h | INTRAMUSCULAR | Status: DC
  Administered 2013-03-21: 1300 [IU]/h via INTRAVENOUS
  Filled 2013-03-21 (×2): qty 250

## 2013-03-21 MED ORDER — WARFARIN SODIUM 7.5 MG PO TABS
7.5000 mg | ORAL_TABLET | Freq: Once | ORAL | Status: AC
  Administered 2013-03-21: 7.5 mg via ORAL
  Filled 2013-03-21: qty 1

## 2013-03-21 MED ORDER — HEPARIN (PORCINE) IN NACL 100-0.45 UNIT/ML-% IJ SOLN
1500.0000 [IU]/h | INTRAMUSCULAR | Status: DC
  Filled 2013-03-21 (×2): qty 250

## 2013-03-21 NOTE — Progress Notes (Signed)
Rx Brief Anticoagulation note: IV Heparin  Assessement:  HL= 0.76 units/ml after decreasing drip to 1500 units/hr  Still slighlty above goal of 0.3-0.7 units/ml  No bleeding or IV interuptions per RN  Plan:  Decreae heparin drip to 1300 units/hr  Daily CBC/HL  Check next HL with am labs (ordered as stat)  Dorrene German 03/21/2013 7:51 PM

## 2013-03-21 NOTE — Progress Notes (Signed)
ANTICOAGULATION CONSULT NOTE - Initial Consult  Pharmacy Consult for warfarin (also dosing heparin) Indication: VTE treatment  No Known Allergies  Patient Measurements: Height: 5' 10"  (177.8 cm) Weight: 182 lb 11.2 oz (82.872 kg) IBW/kg (Calculated) : 73 Heparin Dosing Weight: 83kg  Vital Signs: Temp: 98.1 F (36.7 C) (02/01 0503) Temp src: Oral (02/01 0503) BP: 113/41 mmHg (02/01 0503) Pulse Rate: 68 (02/01 0503)  Labs:  Recent Labs  03/19/13 1650 03/20/13 0416 03/20/13 1346 03/21/13 0424  HGB 10.6* 9.4*  --  9.4*  HCT 31.9* 28.2*  --  28.9*  PLT 83* 62*  --  62*  APTT 27  --   --   --   LABPROT 15.3*  --   --   --   INR 1.24  --   --   --   HEPARINUNFRC  --  <0.10* 0.44 0.77*  CREATININE 0.98 0.95  --   --     Estimated Creatinine Clearance: 70.4 ml/min (by C-G formula based on Cr of 0.95).   Assessment: 26 yoM well-known to Pharmacy for heparin and antibiotic consults. Pt admitted to [facility] 1/31 for cellulitis, and was recently started on warfarin for popliteal thrombosis 03/08/13. Patient reported warfarin home dose is 43m daily with last dose taken 1/30 PTA. INR on admission SUB-therapeutic at 1.24.  Patient has been on heparin since 1/30  CBC is low and decreased from admission (Hgb 9.4 today- stable from yesterday)  Plts low at 62k/stable- pt with ITP and known chronic thrombocytopenia  INR value for today is pending   Goal of Therapy:  INR 2-3 Monitor platelets by anticoagulation protocol: Yes   Plan:  - warfarin 7.563mPO x 1 tonight - daily PT/INR - continue heparin until INR in therapeutic range - monitor CBC and monitor for bleeding  Thank you for the consult.  JeJohny DrillingPharmD, BCPS Clinical Pharmacist Pager: [phone]: [phone]/02/2013 10:33 AM

## 2013-03-21 NOTE — Progress Notes (Signed)
ANTICOAGULATION CONSULT NOTE - Follow Up  Pharmacy Consult for Heparin Indication: VTE treatment  No Known Allergies  Patient Measurements: Height: 5' 10"  (177.8 cm) Weight: 182 lb 11.2 oz (82.872 kg) IBW/kg (Calculated) : 73 Heparin Dosing Weight: 83 kg  Vital Signs: Temp: 98.1 F (36.7 C) (02/01 0503) Temp src: Oral (02/01 0503) BP: 113/41 mmHg (02/01 0503) Pulse Rate: 68 (02/01 0503)  Labs:  Recent Labs  03/19/13 1650 03/20/13 0416 03/20/13 1346 03/21/13 0424  HGB 10.6* 9.4*  --  9.4*  HCT 31.9* 28.2*  --  28.9*  PLT 83* 62*  --  62*  APTT 27  --   --   --   LABPROT 15.3*  --   --   --   INR 1.24  --   --   --   HEPARINUNFRC  --  <0.10* 0.44 0.77*  CREATININE 0.98 0.95  --   --    Estimated Creatinine Clearance: 70.4 ml/min (by C-G formula based on Cr of 0.95).  Medications:  Scheduled:  . atorvastatin  40 mg Oral QPM  . insulin aspart  0-5 Units Subcutaneous QHS  . insulin aspart  0-9 Units Subcutaneous TID WC  . losartan  25 mg Oral Daily  . vancomycin  1,000 mg Intravenous Q12H   Infusions:  . sodium chloride 75 mL/hr (03/20/13 3151)  . heparin     Assessment: 40 yoM presenting to [facility] ED on 1/30 with worsening redness and swelling of RLE concerning for cellulitis (failed outpatient therapy of Doxy and Augmentin).  PMH significant for DM, HTN, CAD, CVA, ITP, anticoagulation with warfarin (recently started 03/08/13) for popliteal thrombosis.  Pharmacy is consulted to dose Heparin IV for recent VTE.  Warfarin PTA: 52m PO daily.  Last dose 03/19/13 with subtherapeutic INR of 1.24 upon admission  First HL < 0.1 with heparin infusing @ 1400 units/hr, Rate increased to 1650 units/hr this morning and corresponding heparin level = 0.44  2/1 am HL drawn 0430, sample frozen then sent to [facility] lab 0730, resulted ~ 1000 at 0.77 units/ml  H/H = 9.4/28.9 with PLTC = 62 (unchanged), no sign bleed, no blood in stool per RN.  Goal of Therapy:  Heparin level 0.3-0.7  units/ml Monitor platelets by anticoagulation protocol: Yes   Plan:   Reduce Heparin infusion to 1500 units/hr  Recheck Heparin level ~ 1800, will order as "stat"  Daily heparin level and CBC  Continue to monitor H&H and platelets  GMinda DittoPharmD Pager [phone]/02/2013, 10:01 AM

## 2013-03-21 NOTE — Progress Notes (Signed)
[facility] PROGRESS NOTE   [name] MLY:[id number] DOB: July 25, 1940 DOA: 03/19/2013 PCP: Jani Gravel, MD  Brief narrative: [name] is an 73 y.o. 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 Dalbert Batman, secondary to worsening erythema and swelling of the right lower extremity despite being treated with Augmentin and doxycycline. Dr. Dalbert Batman noted some blisters and drainage of the posterior calf and sent him to the ER for further evaluation and admission.  Assessment/Plan: Principal Problem:   Cellulitis of right leg Patient was admitted and put on IV vancomycin. MRI obtained 03/19/13 showed asymmetric subcutaneous and popliteal edema consistent with cellulitis. No abscess or deeper infection noted.  Followup culture of posterior lower leg ulcer.  Wound care RN consultation requested. Active Problems:   History of stroke Aspirin currently on hold since he is on a heparin drip.   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 Platelet count stable.   Normocytic Anemia Appears to be relatively new. Hemoglobin 12.9 mg/dL 01/19/13. Anemia panel shows low iron and normal C12 and folic acid levels.   Diabetes mellitus Currently on insulin sensitive SSI. Glucotrol and saxagliptin on hold. CBGs 113-186.   DVT Coumadin on hold. On a heparin drip.  Resume coumadin, D/C heparin when INR is therapeutic.   Hyperlipidemia Continue Lipitor.   Hypertension Hold Cozaar, BP low.  Code Status: Full. Family Communication: Wife updated at the bedside. Disposition Plan: Home when stable.   IV access:  Peripheral IV  Medical Consultants:  Surgery  Other  Consultants:  None  Anti-infectives:  Vancomycin 03/19/13--->  HPI/Subjective: [name] He feels well.  No nausea, vomiting or diarrhea. Right calf still sore, but less so.  Objective: Filed Vitals:   03/20/13 1430 03/20/13 1821 03/20/13 2119 03/21/13 0503  BP: 119/52 125/51 110/50 113/41  Pulse: 80 77 74 68  Temp: 98.1 F (36.7 C)  98.3 F (36.8 C) 98.1 F (36.7 C)  TempSrc: Oral  Oral Oral  Resp: 16 18 18 18   Height:      Weight:      SpO2: 97% 98% 96% 99%    Intake/Output Summary (Last 24 hours) at 03/21/13 0739 Last data filed at 03/21/13 7517  Gross per 24 hour  Intake   1602 ml  Output   1050 ml  Net    552 ml    Exam: Gen:  NAD Cardiovascular:  RRR, No M/R/G Respiratory:  Lungs CTAB Gastrointestinal:  Abdomen soft, NT/ND, + BS Extremities:  Posterior calf swollen, slightly erythematous, with oozing ulcer  Data Reviewed: Basic Metabolic Panel:  Recent Labs Lab 03/19/13 1650 03/20/13 0416  NA 138 140  K 4.8 4.2  CL 102 106  CO2 22 24  GLUCOSE 193* 118*  BUN 18 16  CREATININE 0.98 0.95  CALCIUM 9.3 8.7   GFR Estimated Creatinine Clearance: 70.4 ml/min (by C-G formula based on Cr of 0.95). Liver Function Tests:  Recent Labs Lab 03/19/13 1650  AST 47*  ALT 38  ALKPHOS 74  BILITOT 0.7  PROT 6.7  ALBUMIN 2.9*   Coagulation profile  Recent Labs Lab 03/19/13 1650  INR 1.24    CBC:  Recent Labs Lab 03/19/13 1650 03/20/13 0416 03/21/13 0424  WBC 4.0 3.2* 2.9*  NEUTROABS 2.9  --   --  HGB 10.6* 9.4* 9.4*  HCT 31.9* 28.2* 28.9*  MCV 92.5 92.2 92.9  PLT 83* 62* 62*   CBG:  Recent Labs Lab 03/19/13 2131 03/20/13 0734 03/20/13 1159 03/20/13 1648 03/20/13 2111  GLUCAP 116* 136* 115* 113* 186*   Anemia work up  Recent Labs  03/20/13 1346  VITAMINB12 651  FOLATE >20.0  FERRITIN 124  TIBC 225  IRON 38*  RETICCTPCT 2.8   Microbiology Recent Results (from the past 240 hour(s))  CULTURE, ROUTINE-ABSCESS      Status: None   Collection Time    03/20/13 11:02 AM      Result Value Range Status   Specimen Description OTHER POSTERIOR CALF   Final   Special Requests Normal   Final   Gram Stain PENDING   Incomplete   Culture     Final   Value: Culture reincubated for better growth     Performed at [facility]   Report Status PENDING   Incomplete     Procedures and Diagnostic Studies: Mr Tibia Fibula Right W Wo Contrast  03/20/2013   CLINICAL DATA:  Right lower leg nonhealing wound. Diabetes and hypertension. Recent right popliteal vein thrombosis.  EXAM: MRI OF LOWER RIGHT EXTREMITY WITHOUT AND WITH CONTRAST  TECHNIQUE: Multiplanar, multisequence MR imaging of the lower right extremity was performed both before and after administration of intravenous contrast.  CONTRAST:  71m MULTIHANCE GADOBENATE DIMEGLUMINE 529 MG/ML IV SOLN  COMPARISON:  [place] IMG LOWER  [facility]I *R* dated 03/08/2013  FINDINGS: Asymmetric edema tracks in the right popliteal space and superficial to the gastrocnemius musculature. There is low grade edema distally within the medial head of the right gastrocnemius.  Asymmetric subcutaneous edema is present in the right calf and tracking along the superficial fascia margin all the way to the ankle.  No significant osseous abnormality of the right tibia or fibula.  Lobular 3.3 by 1.6 by 1.7 cm focus of high T2 signal and adjacent skin disruption in the right posteromedial mid calf noted on image 33 of series 7. This does not appreciably enhance. No drainable abscess.  IMPRESSION: 1. Asymmetric subcutaneous and popliteal edema with edema tracking along the fascia planes in the right calf. Associated subcutaneous enhancement suggests cellulitis. There may be some low-grade myositis in the medial head of the gastrocnemius muscle distally. Edema tracking along the deep fascia planes is not associated with enhancement and may be due to the recent DVT rather than necessarily being due to  fasciitis. No abscess observed. 2. Focus of increased T2 signal and the posteromedial mid catheterization on the right with associated skin irregularity, possibly reflecting the underlying wound. The associated hypoenhancing subcutaneous region may reflect inflammatory phlegmon or a thrombosed superficial varix.   Electronically Signed   By: WSherryl BartersM.D.   On: 03/20/2013 08:45      Scheduled Meds: . atorvastatin  40 mg Oral QPM  . insulin aspart  0-5 Units Subcutaneous QHS  . insulin aspart  0-9 Units Subcutaneous TID WC  . losartan  25 mg Oral Daily  . vancomycin  1,000 mg Intravenous Q12H   Continuous Infusions: . sodium chloride 75 mL/hr (03/20/13 05726  . heparin 1,650 Units/hr (03/20/13 1412)    Time spent: 25 minutes.    LOS: 2 days   RAMA,CHRISTINA  [facility] Pager [phone]   *Please note that the hospitalists switch teams on Wednesdays. Please call the flow manager at [phone] you are having difficulty reaching the hospitalist taking  care of this patient as she can update you and provide the most up-to-date pager number of provider caring for the patient. If 8PM-8AM, please contact night-coverage at [email or web address], password [facility]  03/21/2013, 7:39 AM   Information printed out, explained, and given to the patient:  In an effort to keep you and your family informed about your hospital stay, I am providing you with this information sheet. If you or your family have any questions, please do not hesitate to have the nursing staff page me to set up a meeting time.  Trashawn Oquendo Rochel 03/21/2013 2 (Number of days in the hospital)  Treatment team:  Dr. Jacquelynn Cree, Hospitalist (Internist)  Dr. Fanny Skates (surgeon)  Active Treatment Issues with Plan: Principal Problem:   Skin infection of leg You are being treated with vancomycin (antibiotic).   MRI obtained 03/19/13 showed asymmetric subcutaneous and popliteal edema consistent with cellulitis. No  abscess or deeper infection noted.  Active Problems:   History of stroke Aspirin currently on hold since you are on a heparin drip.   ITP (idiopathic thrombocytopenic purpura) Platelet count stable (62).   Low blood count (anemia) Appears to be relatively new. Hemoglobin 12.9 mg/dL 01/19/13 (currently 9.4). You do not have any B33 or folic acid deficiency.  Your iron is slightly low.  May be a reflection of your acute infection.  Recommend follow up blood work in 1 week post discharge.   Diabetes Currently on insulin as needed. Glucotrol and saxagliptin on hold.  Your blood sugars have ranged from 113-186.   Blood clot right leg Coumadin on hold.  We will put you back on coumadin today and stop the heparin when your INR is therapeutic.   High cholesterol On Lipitor.   High blood pressure On Cozaar.  Most recent blood pressure: 113/41 which is slightly low so we are going to hold your Cozaar.  Anticipated discharge date: 1-3 days depending on progress.

## 2013-03-22 DIAGNOSIS — D693 Immune thrombocytopenic purpura: Secondary | ICD-10-CM

## 2013-03-22 LAB — GLUCOSE, CAPILLARY
GLUCOSE-CAPILLARY: 158 mg/dL — AB (ref 70–99)
GLUCOSE-CAPILLARY: 153 mg/dL — AB (ref 70–99)
Glucose-Capillary: 132 mg/dL — ABNORMAL HIGH (ref 70–99)
Glucose-Capillary: 169 mg/dL — ABNORMAL HIGH (ref 70–99)

## 2013-03-22 LAB — CBC
HEMATOCRIT: 29.5 % — AB (ref 39.0–52.0)
Hemoglobin: 9.8 g/dL — ABNORMAL LOW (ref 13.0–17.0)
MCH: 31.1 pg (ref 26.0–34.0)
MCHC: 33.2 g/dL (ref 30.0–36.0)
MCV: 93.7 fL (ref 78.0–100.0)
Platelets: 59 10*3/uL — ABNORMAL LOW (ref 150–400)
RBC: 3.15 MIL/uL — ABNORMAL LOW (ref 4.22–5.81)
RDW: 15.2 % (ref 11.5–15.5)
WBC: 3.5 10*3/uL — AB (ref 4.0–10.5)

## 2013-03-22 LAB — HEPARIN LEVEL (UNFRACTIONATED)
Heparin Unfractionated: 0.34 IU/mL (ref 0.30–0.70)
Heparin Unfractionated: 0.28 IU/mL — ABNORMAL LOW (ref 0.30–0.70)

## 2013-03-22 LAB — PROTIME-INR
INR: 1.38 (ref 0.00–1.49)
PROTHROMBIN TIME: 16.6 s — AB (ref 11.6–15.2)

## 2013-03-22 LAB — MRSA PCR SCREENING: MRSA BY PCR: NEGATIVE

## 2013-03-22 MED ORDER — COLLAGENASE 250 UNIT/GM EX OINT
TOPICAL_OINTMENT | Freq: Every day | CUTANEOUS | Status: DC
  Administered 2013-03-22 – 2013-03-24 (×3): via TOPICAL
  Filled 2013-03-22: qty 30

## 2013-03-22 MED ORDER — HEPARIN (PORCINE) IN NACL 100-0.45 UNIT/ML-% IJ SOLN
1450.0000 [IU]/h | INTRAMUSCULAR | Status: DC
  Administered 2013-03-24: 1450 [IU]/h via INTRAVENOUS
  Filled 2013-03-22 (×3): qty 250

## 2013-03-22 MED ORDER — HEPARIN (PORCINE) IN NACL 100-0.45 UNIT/ML-% IJ SOLN
1350.0000 [IU]/h | INTRAMUSCULAR | Status: DC
  Administered 2013-03-22: 1350 [IU]/h via INTRAVENOUS
  Filled 2013-03-22 (×2): qty 250

## 2013-03-22 MED ORDER — WARFARIN SODIUM 7.5 MG PO TABS
7.5000 mg | ORAL_TABLET | Freq: Once | ORAL | Status: AC
  Administered 2013-03-22: 7.5 mg via ORAL
  Filled 2013-03-22: qty 1

## 2013-03-22 NOTE — Progress Notes (Signed)
ANTICOAGULATION CONSULT NOTE - Follow Up  Pharmacy Consult for Heparin and warfarin Indication: VTE treatment  No Known Allergies  Patient Measurements: Height: 5' 10"  (177.8 cm) Weight: 182 lb 11.2 oz (82.872 kg) IBW/kg (Calculated) : 73 Heparin Dosing Weight: 83 kg  Vital Signs: Temp: 98 F (36.7 C) (02/02 0538) Temp src: Oral (02/02 0538) BP: 133/55 mmHg (02/02 0538) Pulse Rate: 74 (02/02 0538)  Labs:  Recent Labs  03/19/13 1650 03/20/13 0416  03/21/13 0424 03/21/13 1146 03/21/13 1800 03/22/13 0420  HGB 10.6* 9.4*  --  9.4*  --   --  9.8*  HCT 31.9* 28.2*  --  28.9*  --   --  29.5*  PLT 83* 62*  --  62*  --   --  59*  APTT 27  --   --   --   --   --   --   LABPROT 15.3*  --   --   --  15.9*  --  16.6*  INR 1.24  --   --   --  1.30  --  1.38  HEPARINUNFRC  --  <0.10*  < > 0.77*  --  0.76* 0.34  CREATININE 0.98 0.95  --   --   --   --   --   < > = values in this interval not displayed. Estimated Creatinine Clearance: 70.4 ml/min (by C-G formula based on Cr of 0.95).   Assessment: 68 yoM admitted 1/30 with worsening redness and swelling of RLE concerning for cellulitis (failed outpatient therapy of Doxy and Augmentin).  PMH significant for DM, HTN, CAD, CVA, ITP (nkown chronic thrombocytopenia), anticoagulation with warfarin (recently started 03/08/13) for popliteal thrombosis.  Pharmacy is consulted to dose Heparin IV and warfarin for recent VTE.  Warfarin PTA: 56m PO daily.  Last dose 03/19/13 with subtherapeutic INR of 1.24 upon admission  2/2 am HL just within therapeutic range at 0.34 on 1300 units/hr, down from 0.76 on 1400 units/hr  INR 1.38 this AM, up from 1.30 after 7.559mx1 given 2/1 PM  H/H = 9.8/29.5 with PLTC = 59 (essentially stable)  No infusion line or bleeding issues per RN  Goal of Therapy:  Heparin level 0.3-0.7 units/ml Monitor platelets by anticoagulation protocol: Yes   Plan:   Reduce Heparin infusion to 1350 units/hr  Recheck  Heparin level ~ 1800, will order as "stat" so level is not frozen and batched [facility]  Warfarin 7.19m1019mO x 1 tonight  [facility]  Daily heparin level, PT/INR, and CBC  Continue to monitor H&H and platelets  Pharmacy will follow-up INR trend to assist in discharge warfarin dosing  Thank you for the consult.  JesJohny DrillingharmD, BCPS Pager: [phone]: [phone]/2015 8:56 AM

## 2013-03-22 NOTE — Progress Notes (Signed)
[facility] PROGRESS NOTE   [name] VZD:[id number] DOB: 03/11/1940 DOA: 03/19/2013 PCP: Jani Gravel, MD  Brief narrative: [name] is an 73 y.o. 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 Dalbert Batman, secondary to worsening erythema and swelling of the right lower extremity despite being treated with Augmentin and doxycycline. Dr. Dalbert Batman noted some blisters and drainage of the posterior calf and sent him to the ER for further evaluation and admission.  Assessment/Plan: Principal Problem:   Cellulitis of right leg Patient was admitted and put on IV vancomycin. MRI obtained 03/19/13 showed asymmetric subcutaneous and popliteal edema consistent with cellulitis. No abscess or deeper infection noted.  Followup culture of posterior lower leg ulcer, Gram stain positive for gram-positive cocci in pairs and clusters, preliminary culture growing staph aureus.  Wound care RN consultation requested. Active Problems:   History of stroke Aspirin currently on hold since he is on a heparin drip.   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 Platelet count stable.   Normocytic Anemia Appears to be relatively new. Hemoglobin 12.9 mg/dL 01/19/13. Anemia panel shows low iron and normal I95 and folic acid levels.   Diabetes mellitus Currently on insulin sensitive SSI. Glucotrol and saxagliptin on hold. CBGs 128-186.   DVT Continue heparin/Coumadin. D/C heparin when INR is therapeutic.   Hyperlipidemia Continue Lipitor.   Hypertension Cozaar remains on hold.  Code Status: Full. Family Communication: Wife updated at the bedside. Disposition Plan: Home when stable.   IV  access:  Peripheral IV  Medical Consultants:  Surgery  Other Consultants:  None  Anti-infectives:  Vancomycin 03/19/13--->  HPI/Subjective: [name] feels well.  No nausea, vomiting or diarrhea. Right calf still sore, but less tight and soreness improving slowly.  Objective: Filed Vitals:   03/21/13 1324 03/21/13 1808 03/21/13 2133 03/22/13 0538  BP: 114/49 140/52 135/50 133/55  Pulse: 96 80 79 74  Temp: 98 F (36.7 C) 97.9 F (36.6 C) 98 F (36.7 C) 98 F (36.7 C)  TempSrc: Oral Oral Oral Oral  Resp: 18 18 16 18   Height:      Weight:      SpO2: 100% 98% 99% 96%    Intake/Output Summary (Last 24 hours) at 03/22/13 1012 Last data filed at 03/21/13 2134  Gross per 24 hour  Intake   1480 ml  Output    500 ml  Net    980 ml    Exam: Gen:  NAD Cardiovascular:  RRR, No M/R/G Respiratory:  Lungs CTAB Gastrointestinal:  Abdomen soft, NT/ND, + BS Extremities:  Dressings CDI, not removed  Data Reviewed: Basic Metabolic Panel:  Recent Labs Lab 03/19/13 1650 03/20/13 0416  NA 138 140  K 4.8 4.2  CL 102 106  CO2 22 24  GLUCOSE 193* 118*  BUN 18 16  CREATININE 0.98 0.95  CALCIUM 9.3 8.7   GFR Estimated Creatinine Clearance: 70.4 ml/min (by C-G formula based on Cr of 0.95). Liver Function Tests:  Recent Labs Lab 03/19/13 1650  AST 47*  ALT 38  ALKPHOS 74  BILITOT 0.7  PROT 6.7  ALBUMIN 2.9*   Coagulation profile  Recent Labs Lab 03/19/13 1650 03/21/13 1146 03/22/13 0420  INR 1.24 1.30 1.38    CBC:  Recent Labs  Lab 03/19/13 1650 03/20/13 0416 03/21/13 0424 03/22/13 0420  WBC 4.0 3.2* 2.9* 3.5*  NEUTROABS 2.9  --   --   --   HGB 10.6* 9.4* 9.4* 9.8*  HCT 31.9* 28.2* 28.9* 29.5*  MCV 92.5 92.2 92.9 93.7  PLT 83* 62* 62* 59*   CBG:  Recent Labs Lab 03/21/13 0742 03/21/13 1156 03/21/13 1706 03/21/13 2132 03/22/13 0717  GLUCAP 128* 133* 129* 186* 132*   Anemia work up  Recent Labs  03/20/13 1346  VITAMINB12  651  FOLATE >20.0  FERRITIN 124  TIBC 225  IRON 38*  RETICCTPCT 2.8   Microbiology Recent Results (from the past 240 hour(s))  CULTURE, ROUTINE-ABSCESS     Status: None   Collection Time    03/20/13 11:02 AM      Result Value Range Status   Specimen Description OTHER POSTERIOR CALF   Final   Special Requests Normal   Final   Gram Stain     Final   Value: FEW WBC PRESENT, PREDOMINANTLY PMN     RARE SQUAMOUS EPITHELIAL CELLS PRESENT     FEW GRAM POSITIVE COCCI IN PAIRS     IN CLUSTERS     Performed at [facility]   Culture     Final   Value: MODERATE STAPHYLOCOCCUS AUREUS     Note: RIFAMPIN AND GENTAMICIN SHOULD NOT BE USED AS SINGLE DRUGS FOR TREATMENT OF STAPH INFECTIONS.     Performed at [facility]   Report Status PENDING   Incomplete     Procedures and Diagnostic Studies: No results found.   Scheduled Meds: . atorvastatin  40 mg Oral QPM  . insulin aspart  0-5 Units Subcutaneous QHS  . insulin aspart  0-9 Units Subcutaneous TID WC  . vancomycin  1,000 mg Intravenous Q12H  . warfarin  7.5 mg Oral ONCE-1800  . Warfarin - Pharmacist Dosing Inpatient   Does not apply q1800   Continuous Infusions: . sodium chloride 75 mL/hr at 03/22/13 0411  . heparin 1,350 Units/hr (03/22/13 0850)    Time spent: 25 minutes.    LOS: 3 days   [place] [facility] Pager [phone].   *Please note that the hospitalists switch teams on Wednesdays. Please call the flow manager at [phone] if you are having difficulty reaching the hospitalist taking care of this patient as she can update you and provide the most up-to-date pager number of provider caring for the patient. If 8PM-8AM, please contact night-coverage at [email or web address], password [facility]  03/22/2013, 10:12 AM   Information printed out, explained, and given to the patient:   In an effort to keep you and your family informed about your hospital stay, I am providing you with this information sheet.  If you or your family have any questions, please do not hesitate to have the nursing staff page me to set up a meeting time.  [name] 03/22/2013 3 (Number of days in the hospital)  Treatment team:  Dr. Jacquelynn Cree, Hospitalist (Internist)  Dr. Fanny Skates (surgeon)  Active Treatment Issues with Plan: Principal Problem:   Skin infection of leg You are being treated with vancomycin (antibiotic).   MRI obtained 03/19/13 showed inflammation in your leg extending up to the knee consistent with cellulitis (soft tissue infection). No abscess or deeper infection noted. Your wound cultures are growing staph aureus. Active Problems:   History of stroke Aspirin currently on hold since you are on a heparin drip.   ITP (idiopathic thrombocytopenic  purpura) Platelet count stable (59).   Low blood count (anemia) Appears to be relatively new. Hemoglobin 12.9 mg/dL 01/19/13 (currently 9.8). You do not have any I94 or folic acid deficiency.  Your iron is slightly low.  May be a reflection of your acute infection.  Recommend follow up blood work in 1 week post discharge.   Diabetes Currently on insulin as needed. Glucotrol and saxagliptin on hold.  Your blood sugars have ranged from 128-186.   Blood clot right leg Coumadin on hold.  We will put you back on coumadin today and stop the heparin when your INR is therapeutic.   High cholesterol On Lipitor.   High blood pressure Holding Cozaar: Most recent BP 133/55.  Anticipated discharge date: 1-2 days depending on progress and final culture results.

## 2013-03-22 NOTE — Care Management Note (Signed)
Cm spoke with patient and spouse at the bedside concerning discharge planning. Per pt currently active with [facility]. [facility] rep Kirke Corin notified. Awaiting PT eval. Pt will require resumption of care orders upon discharge.     Venita Lick Michaelah Credeur,MSN,RN [phone]

## 2013-03-22 NOTE — Progress Notes (Signed)
Rx Brief Anticoagulation note: IV Heparin  Assessement:  HL= 0.28 units/ml (subtherapeutic at rate of 1350 units/hr)  Below goal of 0.3-0.7 units/ml  No bleeding or IV interuptions per RN  Heparin dosing wt = 83kg  Plan:  Increase heparin to 1450 units/hr  RN this evening alerted me that vancmoycin and heparin have been infusion together, although the exact concentrations are not studied, very similar concentrations were incompatible.  A second IV to be placed to avoid infusing together.   Daily CBC/HL  Check next HL with am labs (ordered as stat)  Doreene Eland, PharmD, BCPS.   Pager: [phone] 03/22/2013 7:56 PM

## 2013-03-22 NOTE — Progress Notes (Signed)
ANTIBIOTIC CONSULT NOTE - Follow up  Pharmacy Consult for Vancomycin Indication: cellulitis  No Known Allergies  Patient Measurements: Height: 5' 10"  (177.8 cm) Weight: 182 lb 11.2 oz (82.872 kg) IBW/kg (Calculated) : 73  Vital Signs: Temp: 98 F (36.7 C) (02/02 0538) Temp src: Oral (02/02 0538) BP: 133/55 mmHg (02/02 0538) Pulse Rate: 74 (02/02 0538)  Labs:  Recent Labs  03/19/13 1650 03/20/13 0416 03/21/13 0424 03/22/13 0420  WBC 4.0 3.2* 2.9* 3.5*  HGB 10.6* 9.4* 9.4* 9.8*  PLT 83* 62* 62* 59*  CREATININE 0.98 0.95  --   --    Estimated Creatinine Clearance: 70.4 ml/min (by C-G formula based on Cr of 0.95).   Assessment: 57 yoM admitted 1/30 with worsening redness and swelling of RLE concerning for cellulitis with several open lesions.  He has failed outpatient therapy of Doxy and Augmentin prior to admission. PMH significant for DM, HTN, CAD, CVA, ITP, anticoagulation with warfarin for recent popliteal thrombosis. Pharmacy has been consulted to dose Vancomycin.  Antiinfectives 1/30 >> Vanc >>  Tmax: remains afebrile WBCs: remain low at 3.5K Renal: SCr 0.95 on 1/31, CrCl ~ 70 ml/min CG and normalized  Microbiology 1/31 abscess: moderate Staph aureus- sensitivities pending   Goal of Therapy:  Vancomycin trough level 15-20 mcg/ml Higher trough goal used as leg ulcer appears more complicated than simple cellulitis, no abscess or deeper infection noted on MR tibula/fibula performed 1/31  Plan:  - continue vancomycin 1g IV q12h - awaiting abscess culture results, will check vancomycin trough if culture results MRSA, expect transition to beta-lactam antibiotic if results as MSSA - follow-up clinical course, renal function - follow-up antibiotic de-escalation and length of therapy - bmet in AM  Thank you for the consult.  Johny Drilling, PharmD, BCPS Pager: [phone] Pharmacy: [phone] 03/22/2013 9:53 AM

## 2013-03-22 NOTE — Progress Notes (Signed)
Pt. Had complaints of right arm swelling. On assessment pt. Arm had +2 radial pulse and +1 edema and was warm to the touch.  Pt. Had no complaints of pain at the IV site.  MD was notified. Orders were given to elevate the arm and move IV site to the other arm.  Will continue to monitor.

## 2013-03-22 NOTE — Consult Note (Signed)
WOC wound consult note Reason for Consult: Cellulitis right posterior calf.  Wound type: Inflammatory/Cellulitis Measurement: Generalized erythema extends 18cm x 8 cm;  Non-intact areas:  2 lesions measuring 1 cm x 1 cm and 2 cm x 1 cm, both 100% soft slough and eschar. Wound bed: 100% devitalized tissue, soft Drainage (amount, consistency, odor) Moderate, serosanguinous drainage, musty odor.  Periwound: Erythematous and indurated.  Dressing procedure/placement/frequency: Cleanse right posterior calf with NS and pat gently dry.  Apply Santyl ointment to open lesions.  1/8 inch thickness (opaque) and top with vaseline gauze.  Secure with kerlix and tape. Change daily.  Will not follow at this time.  Please re-consult if needed.  Domenic Moras RN BSN [place] Pager [phone]

## 2013-03-22 NOTE — Progress Notes (Signed)
Second I.V site placed . No complains of pain or swelling at both sites will monitor closely.

## 2013-03-22 NOTE — Evaluation (Signed)
Physical Therapy Evaluation Patient Details Name: [name] MRN: [id number] DOB: [birth year] Today's Date: 03/22/2013 Time: 6767-2094 PT Time Calculation (min): 10 min  PT Assessment / Plan / Recommendation History of Present Illness  73 y.o. 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 Dalbert Batman, secondary to worsening erythema and swelling of the right lower extremity despite being treated with Augmentin and doxycycline. Dr. Dalbert Batman noted some blisters and drainage of the posterior calf and sent him to the ER for further evaluation and admission.  Clinical Impression  Pt admitted with R LE cellulitis. Pt currently with functional limitations due to the deficits listed below (see PT Problem List).  Pt will benefit from skilled PT to increase their independence and safety with mobility to allow discharge to the venue listed below.  Pt mobilizing well however present with abnormal gait pattern due to pain.  Pt agreeable to acute PT to continue gait training and attempt stairs however feel he will progress well and not require f/u PT upon d/c.     PT Assessment  Patient needs continued PT services    Follow Up Recommendations  No PT follow up    Does the patient have the potential to tolerate intense rehabilitation      Barriers to Discharge        Equipment Recommendations  None recommended by PT    Recommendations for Other Services     Frequency Min 3X/week    Precautions / Restrictions Precautions Precautions: Fall   Pertinent Vitals/Pain No pain at rest, pain not rated but approx mod R LE during ambulation      Mobility  Bed Mobility General bed mobility comments: pt up in recliner on  arrival Transfers Overall transfer level: Needs assistance Equipment used: Rolling walker (2 wheeled) Transfers: Sit to/from Stand Sit to Stand: Min guard General transfer comment: verbal cues for hand placement Ambulation/Gait Ambulation/Gait assistance: Min guard Ambulation Distance (Feet): 120 Feet Assistive device: Rolling walker (2 wheeled) Gait Pattern/deviations: Step-through pattern;Antalgic;Decreased weight shift to right;Decreased stance time - right;Decreased dorsiflexion - right General Gait Details: pt reports being unable to tolerate flat foot or heel strike for a few days prior to admission, encouraged to attempt today however pt reports too painful still    Exercises     PT Diagnosis: Abnormality of gait;Acute pain  PT Problem List: Decreased mobility;Pain PT Treatment Interventions: Gait training;DME instruction;Stair training;Functional mobility training;Therapeutic activities;Therapeutic exercise;Patient/family education     PT Goals(Current goals can be found in the care plan section) Acute Rehab PT Goals PT Goal Formulation: With patient Time For Goal Achievement: 03/29/13 Potential to Achieve Goals: Good  Visit Information  Last PT Received On: 03/22/13 Assistance Needed: +1 History of Present Illness: 73 y.o. 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 Dalbert Batman, secondary to worsening erythema and swelling of the right lower extremity despite being treated with Augmentin and doxycycline. Dr. Dalbert Batman noted some blisters and drainage of the posterior calf and sent him to the ER for further evaluation and admission.  Prior [place] expects to be  discharged to:: Private residence Living Arrangements: Spouse/significant other Type of Home: House Home Access: Stairs to enter Technical brewer of Steps: 3 Entrance Stairs-Rails: Right Home Layout: One level [place]: Environmental consultant - 2 wheels Prior Function Level of Independence: Independent Communication Communication: No difficulties    Cognition  Cognition Arousal/Alertness: Awake/alert Behavior During Therapy: WFL for tasks assessed/performed Overall Cognitive Status: Within Functional Limits for tasks assessed    Extremity/Trunk Assessment Lower Extremity Assessment Lower Extremity Assessment: RLE deficits/detail RLE Deficits / Details: states painful to ambulate and decreased DF observed functionally RLE: Unable to fully assess due to pain   Balance    End of Session PT - End of Session Activity Tolerance: Patient tolerated treatment well Patient left: with call bell/phone within reach;in chair;with family/visitor present  GP     [name],KATHrine E 03/22/2013, 3:52 PM Carmelia Bake, PT, DPT 03/22/2013 Pager: [phone]

## 2013-03-23 DIAGNOSIS — L03119 Cellulitis of unspecified part of limb: Principal | ICD-10-CM

## 2013-03-23 DIAGNOSIS — L02419 Cutaneous abscess of limb, unspecified: Principal | ICD-10-CM

## 2013-03-23 DIAGNOSIS — Z862 Personal history of diseases of the blood and blood-forming organs and certain disorders involving the immune mechanism: Secondary | ICD-10-CM

## 2013-03-23 DIAGNOSIS — D696 Thrombocytopenia, unspecified: Secondary | ICD-10-CM

## 2013-03-23 LAB — BASIC METABOLIC PANEL
BUN: 10 mg/dL (ref 6–23)
CO2: 18 meq/L — AB (ref 19–32)
Calcium: 7.7 mg/dL — ABNORMAL LOW (ref 8.4–10.5)
Chloride: 111 mEq/L (ref 96–112)
Creatinine, Ser: 0.9 mg/dL (ref 0.50–1.35)
GFR calc Af Amer: >90 mL/min (ref >90)
GFR calc non Af Amer: 82 mL/min — ABNORMAL LOW (ref >90)
Glucose, Bld: 135 mg/dL — ABNORMAL HIGH (ref 70–99)
POTASSIUM: 3.9 meq/L (ref 3.7–5.3)
SODIUM: 142 meq/L (ref 137–147)

## 2013-03-23 LAB — GLUCOSE, CAPILLARY
GLUCOSE-CAPILLARY: 125 mg/dL — AB (ref 70–99)
GLUCOSE-CAPILLARY: 172 mg/dL — AB (ref 70–99)
Glucose-Capillary: 121 mg/dL — ABNORMAL HIGH (ref 70–99)
Glucose-Capillary: 155 mg/dL — ABNORMAL HIGH (ref 70–99)

## 2013-03-23 LAB — CBC
HEMATOCRIT: 29.1 % — AB (ref 39.0–52.0)
HEMOGLOBIN: 9.8 g/dL — AB (ref 13.0–17.0)
MCH: 31 pg (ref 26.0–34.0)
MCHC: 33.7 g/dL (ref 30.0–36.0)
MCV: 92.1 fL (ref 78.0–100.0)
Platelets: 49 10*3/uL — ABNORMAL LOW (ref 150–400)
RBC: 3.16 MIL/uL — AB (ref 4.22–5.81)
RDW: 15 % (ref 11.5–15.5)
WBC: 3.9 10*3/uL — ABNORMAL LOW (ref 4.0–10.5)

## 2013-03-23 LAB — CULTURE, ROUTINE-ABSCESS: Special Requests: NORMAL

## 2013-03-23 LAB — HEPARIN LEVEL (UNFRACTIONATED): Heparin Unfractionated: 0.48 IU/mL (ref 0.30–0.70)

## 2013-03-23 LAB — PROTIME-INR
INR: 1.85 — ABNORMAL HIGH (ref 0.00–1.49)
Prothrombin Time: 20.8 seconds — ABNORMAL HIGH (ref 11.6–15.2)

## 2013-03-23 MED ORDER — DOXYCYCLINE HYCLATE 100 MG PO TABS
100.0000 mg | ORAL_TABLET | Freq: Two times a day (BID) | ORAL | Status: DC
Start: 2013-03-23 — End: 2013-03-24
  Administered 2013-03-23 – 2013-03-24 (×2): 100 mg via ORAL
  Filled 2013-03-23 (×3): qty 1

## 2013-03-23 MED ORDER — WARFARIN SODIUM 5 MG PO TABS
5.0000 mg | ORAL_TABLET | Freq: Once | ORAL | Status: AC
  Administered 2013-03-23: 5 mg via ORAL
  Filled 2013-03-23: qty 1

## 2013-03-23 NOTE — Progress Notes (Addendum)
[facility] PROGRESS NOTE   [name] WUG:[id number] DOB: December 29, 1940 DOA: 03/19/2013 PCP: Jani Gravel, MD  Brief narrative: [name] is an 73 y.o. 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 Dalbert Batman, secondary to worsening erythema and swelling of the right lower extremity despite being treated with Augmentin and doxycycline. Dr. Dalbert Batman noted some blisters and drainage of the posterior calf and sent him to the ER for further evaluation and admission.  Assessment/Plan: Principal Problem:   Cellulitis of right leg Patient was admitted and put on IV vancomycin. MRI obtained 03/19/13 showed asymmetric subcutaneous and popliteal edema consistent with cellulitis. No abscess or deeper infection noted.  Cultures of drainage +MSSA of lower leg ulcer.  Wound care RN saw the patient 03/22/13 and recommended the following wound care:  Cleanse right posterior calf with NS and pat gently dry. Apply Santyl ointment to open lesions. 1/8 inch thickness (opaque) and top with vaseline gauze. Secure with kerlix and tape. Change daily. Will D/C Vancomycin and start oral doxycycline. Active Problems:   History of stroke Aspirin currently on hold since he is on a heparin drip.   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 Platelet count stable.   Normocytic Anemia Appears to be relatively new. Hemoglobin 12.9 mg/dL 01/19/13. Anemia panel shows low iron and normal U88 and folic acid levels.   Diabetes mellitus Currently on insulin sensitive SSI. Glucotrol and saxagliptin on hold. CBGs 121-169.   DVT Continue heparin/Coumadin. D/C heparin when INR is therapeutic. Lovenox contraindicated  secondary to history of ITP and the patient does not want to try Xarelto.   Hyperlipidemia Continue Lipitor.   Hypertension Cozaar remains on hold.  Code Status: Full. Family Communication: Wife updated at the bedside. Disposition Plan: Home when stable.   IV access:  Peripheral IV  Medical Consultants:  Surgery  Other Consultants:  None  Anti-infectives:  Vancomycin 03/19/13--->03/23/13  Doxycycline 03/23/13--->  HPI/Subjective: [name] feels well.  No nausea, vomiting or diarrhea. Right calf still a bit sore, but less tight and soreness improving slowly. He has been up with physical therapy.  Objective: Filed Vitals:   03/22/13 1401 03/22/13 1800 03/22/13 2134 03/23/13 0513  BP: 121/45 140/51 140/53 113/62  Pulse: 76 80 75 75  Temp: 97.9 F (36.6 C) 98.3 F (36.8 C) 98.3 F (36.8 C) 98.2 F (36.8 C)  TempSrc: Oral Oral Oral Oral  Resp: 18 18 16 15   Height:      Weight:      SpO2: 96% 97% 98% 97%    Intake/Output Summary (Last 24 hours) at 03/23/13 1052 Last data filed at 03/23/13 0900  Gross per 24 hour  Intake    480 ml  Output      0 ml  Net    480 ml    Exam: Gen:  NAD Cardiovascular:  RRR, No M/R/G Respiratory:  Lungs CTAB Gastrointestinal:  Abdomen soft, NT/ND, + BS Extremities:  Dressings with some dried blood, not removed  Data Reviewed: Basic Metabolic Panel:  Recent Labs Lab 03/19/13 1650 03/20/13 0416 03/23/13 0454  NA 138 140 142  K 4.8 4.2 3.9  CL 102 106 111  CO2 22 24 18*  GLUCOSE 193* 118* 135*  BUN 18 16 10  CREATININE 0.98 0.95 0.90  CALCIUM 9.3 8.7 7.7*   GFR Estimated Creatinine Clearance: 74.4 ml/min (by C-G formula based on Cr of 0.9). Liver Function Tests:  Recent Labs Lab 03/19/13 1650  AST 47*  ALT 38  ALKPHOS 74  BILITOT 0.7  PROT 6.7  ALBUMIN 2.9*   Coagulation profile  Recent Labs Lab 03/19/13 1650 03/21/13 1146 03/22/13 0420 03/23/13 0454  INR 1.24 1.30 1.38 1.85*     CBC:  Recent Labs Lab 03/19/13 1650 03/20/13 0416 03/21/13 0424 03/22/13 0420 03/23/13 0454  WBC 4.0 3.2* 2.9* 3.5* 3.9*  NEUTROABS 2.9  --   --   --   --   HGB 10.6* 9.4* 9.4* 9.8* 9.8*  HCT 31.9* 28.2* 28.9* 29.5* 29.1*  MCV 92.5 92.2 92.9 93.7 92.1  PLT 83* 62* 62* 59* 49*   CBG:  Recent Labs Lab 03/22/13 0717 03/22/13 1151 03/22/13 1727 03/22/13 2132 03/23/13 0704  GLUCAP 132* 158* 153* 169* 121*   Anemia work up  Recent Labs  03/20/13 1346  VITAMINB12 651  FOLATE >20.0  FERRITIN 124  TIBC 225  IRON 38*  RETICCTPCT 2.8   Microbiology Recent Results (from the past 240 hour(s))  CULTURE, ROUTINE-ABSCESS     Status: None   Collection Time    03/20/13 11:02 AM      Result Value Range Status   Specimen Description OTHER POSTERIOR CALF   Final   Special Requests Normal   Final   Gram Stain     Final   Value: FEW WBC PRESENT, PREDOMINANTLY PMN     RARE SQUAMOUS EPITHELIAL CELLS PRESENT     FEW GRAM POSITIVE COCCI IN PAIRS     IN CLUSTERS     Performed at [facility]   Culture     Final   Value: MODERATE STAPHYLOCOCCUS AUREUS     Note: RIFAMPIN AND GENTAMICIN SHOULD NOT BE USED AS SINGLE DRUGS FOR TREATMENT OF STAPH INFECTIONS.     Performed at [facility]   Report Status 03/23/2013 FINAL   Final   Organism ID, Bacteria STAPHYLOCOCCUS AUREUS   Final  MRSA PCR SCREENING     Status: None   Collection Time    03/22/13 11:02 AM      Result Value Range Status   MRSA by PCR NEGATIVE  NEGATIVE Final   Comment:            The GeneXpert MRSA Assay (FDA     approved for NASAL specimens     only), is one component of a     comprehensive MRSA colonization     surveillance program. It is not     intended to diagnose MRSA     infection nor to guide or     monitor treatment for     MRSA infections.     Procedures and Diagnostic Studies: No results found.   Scheduled Meds: . atorvastatin  40 mg Oral QPM  . collagenase   Topical  Daily  . insulin aspart  0-5 Units Subcutaneous QHS  . insulin aspart  0-9 Units Subcutaneous TID WC  . vancomycin  1,000 mg Intravenous Q12H  . Warfarin - Pharmacist Dosing Inpatient   Does not apply q1800   Continuous Infusions: . sodium chloride 75 mL/hr at 03/22/13 1821  . heparin 1,450 Units/hr (03/22/13 2102)    Time spent: 25 minutes.    LOS: 4 days   [facility] Pager [phone].   *Please note  that the hospitalists switch teams on Wednesdays. Please call the flow manager at [phone] if you are having difficulty reaching the hospitalist taking care of this patient as she can update you and provide the most up-to-date pager number of provider caring for the patient. If 8PM-8AM, please contact night-coverage at [email or web address], password [facility]  03/23/2013, 10:52 AM   Information printed out, explained, and given to the patient:   In an effort to keep you and your family informed about your hospital stay, I am providing you with this information sheet. If you or your family have any questions, please do not hesitate to have the nursing staff page me to set up a meeting time.  [name] 03/23/2013 4 (Number of days in the hospital)  Treatment team:  Dr. Jacquelynn Cree, Hospitalist (Internist)  Dr. Fanny Skates (surgeon)  Active Treatment Issues with Plan: Principal Problem:   Skin infection of leg You are being treated with vancomycin (antibiotic).   MRI obtained 03/19/13 showed inflammation in your leg extending up to the knee consistent with cellulitis (soft tissue infection). No abscess or deeper infection noted. Your wound cultures are growing staph aureus. Active Problems:   History of stroke Aspirin currently on hold since you are on a heparin drip.   ITP (idiopathic thrombocytopenic purpura) Platelet count stable (59).   Low blood count (anemia) Appears to be relatively new. Hemoglobin 12.9 mg/dL 01/19/13 (currently 9.8). You do not  have any K34 or folic acid deficiency.  Your iron is slightly low.  May be a reflection of your acute infection.  Recommend follow up blood work in 1 week post discharge.   Diabetes Currently on insulin as needed. Glucotrol and saxagliptin on hold.  Your blood sugars have ranged from 128-186.   Blood clot right leg Coumadin on hold.  We will put you back on coumadin today and stop the heparin when your INR is therapeutic.   High cholesterol On Lipitor.   High blood pressure Holding Cozaar: Most recent BP 133/55.  Anticipated discharge date: 1-2 days depending on progress and final culture results.

## 2013-03-23 NOTE — Progress Notes (Signed)
ANTICOAGULATION CONSULT NOTE - Follow Up  Pharmacy Consult for Heparin and warfarin Indication: VTE treatment  No Known Allergies  Patient Measurements: Height: 5' 10"  (177.8 cm) Weight: 182 lb 11.2 oz (82.872 kg) IBW/kg (Calculated) : 73 Heparin Dosing Weight: 83 kg  Vital Signs: Temp: 98.2 F (36.8 C) (02/03 0513) Temp src: Oral (02/03 0513) BP: 113/62 mmHg (02/03 0513) Pulse Rate: 75 (02/03 0513)  Labs:  Recent Labs  03/21/13 0424 03/21/13 1146  03/22/13 0420 03/22/13 1753 03/23/13 0454  HGB 9.4*  --   --  9.8*  --  9.8*  HCT 28.9*  --   --  29.5*  --  29.1*  PLT 62*  --   --  59*  --  49*  LABPROT  --  15.9*  --  16.6*  --  20.8*  INR  --  1.30  --  1.38  --  1.85*  HEPARINUNFRC 0.77*  --   < > 0.34 0.28* 0.48  CREATININE  --   --   --   --   --  0.90  < > = values in this interval not displayed. Estimated Creatinine Clearance: 74.4 ml/min (by C-G formula based on Cr of 0.9).   Assessment: 69 yoM admitted 1/30 with worsening redness and swelling of RLE concerning for cellulitis (failed outpatient therapy of Doxy and Augmentin).  PMH significant for DM, HTN, CAD, CVA, ITP (nkown chronic thrombocytopenia), anticoagulation with warfarin (recently started 03/08/13) for popliteal thrombosis.  Pharmacy is consulted to dose Heparin IV and warfarin for recent VTE.  Warfarin PTA: 11m PO daily.  Last dose 03/19/13 with subtherapeutic INR of 1.24 upon admission  Heparin level therapeutic this AM on 1450 units/hr after level was low last night and rate increased  INR responding now up to 1.85 from 1.38 after 7.529mx2  CBC stable  No infusion line or bleeding issues per RN  Goal of Therapy:  Heparin level 0.3-0.7 units/ml Monitor platelets by anticoagulation protocol: Yes   Plan:  1) Plan to discharge patient once INR > 2 and can D/C IV heparin. Will not change to Lovenox at this point due to chronically low plts. Patient also does not want to try Xarelto 2) Continue  IV heparin 1450 units/hr 3) Due to jump in INR from 1.38 to 1.85, will give warfarin 64m22monight 4) If able to be discharged tomorrow as anticipated, would likely recommend warfarin 64mg51mily with close follow up with note that INR was subtherapeutic on 2mg 38mly PTA   JustiAdrian SaranrmD, BCPS Pager [phone] 10:49 AM

## 2013-03-24 LAB — GLUCOSE, CAPILLARY: Glucose-Capillary: 120 mg/dL — ABNORMAL HIGH (ref 70–99)

## 2013-03-24 LAB — CBC
HCT: 28.6 % — ABNORMAL LOW (ref 39.0–52.0)
Hemoglobin: 9.4 g/dL — ABNORMAL LOW (ref 13.0–17.0)
MCH: 30.5 pg (ref 26.0–34.0)
MCHC: 32.9 g/dL (ref 30.0–36.0)
MCV: 92.9 fL (ref 78.0–100.0)
Platelets: 51 10*3/uL — ABNORMAL LOW (ref 150–400)
RBC: 3.08 MIL/uL — AB (ref 4.22–5.81)
RDW: 15.4 % (ref 11.5–15.5)
WBC: 3.5 10*3/uL — AB (ref 4.0–10.5)

## 2013-03-24 LAB — PROTIME-INR
INR: 2.31 — AB (ref 0.00–1.49)
PROTHROMBIN TIME: 24.6 s — AB (ref 11.6–15.2)

## 2013-03-24 LAB — HEPARIN LEVEL (UNFRACTIONATED): HEPARIN UNFRACTIONATED: 0.54 [IU]/mL (ref 0.30–0.70)

## 2013-03-24 MED ORDER — WARFARIN SODIUM 5 MG PO TABS: 5.0000 mg | ORAL_TABLET | Freq: Once | ORAL | Status: DC

## 2013-03-24 MED ORDER — ALUM & MAG HYDROXIDE-SIMETH 200-200-20 MG/5ML PO SUSP: 30.0000 mL | Freq: Four times a day (QID) | ORAL | Status: DC | PRN

## 2013-03-24 MED ORDER — ACETAMINOPHEN 325 MG PO TABS: 650.0000 mg | ORAL_TABLET | Freq: Four times a day (QID) | ORAL | Status: DC | PRN

## 2013-03-24 MED ORDER — WARFARIN SODIUM 5 MG PO TABS
5.0000 mg | ORAL_TABLET | Freq: Once | ORAL | Status: DC
  Filled 2013-03-24: qty 1

## 2013-03-24 MED ORDER — TRAMADOL HCL 50 MG PO TABS: 50.0000 mg | ORAL_TABLET | Freq: Four times a day (QID) | ORAL | Status: DC | PRN

## 2013-03-24 MED ORDER — COLLAGENASE 250 UNIT/GM EX OINT: TOPICAL_OINTMENT | Freq: Every day | CUTANEOUS | Status: DC

## 2013-03-24 MED ORDER — DOXYCYCLINE HYCLATE 100 MG PO TABS: 100.0000 mg | ORAL_TABLET | Freq: Two times a day (BID) | ORAL | Status: DC

## 2013-03-24 NOTE — Care Management Note (Signed)
Md orders for [facility] to provide dressing changes and lab draws. [place] notified. Pt scheduled to follow up with PCP office on 03/29/13 at 11:00am. Pt's spouse to provide transportation home and assist with home care.    Venita Lick Genaro Bekker,MSN,RN [phone]

## 2013-03-24 NOTE — Progress Notes (Signed)
ANTICOAGULATION CONSULT NOTE - Follow Up  Pharmacy Consult for Warfarin Indication: VTE treatment, chronic Warfarin  No Known Allergies  Patient Measurements: Height: 5' 10"  (177.8 cm) Weight: 182 lb 11.2 oz (82.872 kg) IBW/kg (Calculated) : 73 Heparin Dosing Weight: 83 kg  Vital Signs: Temp: 97.7 F (36.5 C) (02/04 0649) Temp src: Oral (02/04 0649) BP: 126/64 mmHg (02/04 0649) Pulse Rate: 74 (02/04 0649)  Labs:  Recent Labs  03/22/13 0420 03/22/13 1753 03/23/13 0454 03/24/13 0410  HGB 9.8*  --  9.8* 9.4*  HCT 29.5*  --  29.1* 28.6*  PLT 59*  --  49* 51*  LABPROT 16.6*  --  20.8* 24.6*  INR 1.38  --  1.85* 2.31*  HEPARINUNFRC 0.34 0.28* 0.48 0.54  CREATININE  --   --  0.90  --    Assessment: 71 yoM admitted 1/30 with worsening redness and swelling of RLE concerning for cellulitis (failed outpatient therapy of Doxy and Augmentin).  PMH significant for DM, HTN, CAD, CVA, ITP (known chronic thrombocytopenia), anticoagulation with warfarin (recently started 03/08/13) for popliteal thrombosis.  Pharmacy is consulted to dose Heparin IV and warfarin for recent VTE. Will not change to Lovenox due to chronically low plts. Patient also does not want to try Xarelto   Warfarin PTA: 62m PO daily.  Last dose 03/19/13 with subtherapeutic INR of 1.24 upon admission  Heparin discontinued this am with INR in therapeutic range, now 2.31  Warfarin doses from 2/1 = 7.5, 7.5, 5655m Hx of ITP and chronically low platelets.CBC stable, No bleeding issues per RN  Plan to discharge patient once INR > 2 and IV Heparin discontinued.  Doxycycline po from 2/3, has potential to increase INR  Goal of Therapy:  INR 2-3 Monitor platelets by anticoagulation protocol: Yes   Plan:   Repeat Warfarin 55m61moday  IV Heparin discontinued  Would recommend increasing daily dose as outpatient to probably 55mg50mily  Also recommend close monitoring of INR with concurrent Doxycycline  GreeMinda DittoarmD Pager [phone]/2015, 10:44 AM

## 2013-03-24 NOTE — Progress Notes (Signed)
Physical Therapy Treatment Patient Details Name: [name] MRN: [id number] DOB: 1940/07/12 Today's Date: 03/24/2013 Time: 2683-4196 PT Time Calculation (min): 15 min  PT Assessment / Plan / Recommendation  History of Present Illness 73 y.o. 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 Dalbert Batman, secondary to worsening erythema and swelling of the right lower extremity despite being treated with Augmentin and doxycycline. Dr. Dalbert Batman noted some blisters and drainage of the posterior calf and sent him to the ER for further evaluation and admission.   PT Comments   Pt reports not ambulating since PT last session so happy to ambulate with therapy.  Educated on attempting heel strike or flat foot as tolerated (reports increased pain so has been forefoot walking).  Pt also encouraged to perform ankle pumps/circles throughout the day to increase ankle ROM (as R side more limited then L).   Follow Up Recommendations  No PT follow up     Does the patient have the potential to tolerate intense rehabilitation     Barriers to Discharge        Equipment Recommendations  None recommended by PT    Recommendations for Other Services    Frequency Min 3X/week   Progress towards PT Goals Progress towards PT goals: Progressing toward goals  Plan Current plan remains appropriate    Precautions / Restrictions Precautions Precautions: Fall   Pertinent Vitals/Pain Pt reports mild R lower leg pain during ambulation however states better then previous session    Mobility  Bed Mobility Overal bed mobility: Modified Independent Transfers Overall transfer level: Needs assistance Equipment used: Rolling walker (2  wheeled) Transfers: Sit to/from Stand Sit to Stand: Min guard General transfer comment: verbal cues for hand placement Ambulation/Gait Ambulation/Gait assistance: Min guard Ambulation Distance (Feet): 200 Feet Assistive device: Rolling walker (2 wheeled) Gait Pattern/deviations: Step-through pattern;Antalgic;Decreased dorsiflexion - right General Gait Details: better gait pattern observed today however pt still unable to perform heel strike or place heel completely on floor, encouraged heel strike as able as well as static standing with both heels on floor as tolerated    Exercises     PT Diagnosis:    PT Problem List:   PT Treatment Interventions:     PT Goals (current goals can now be found in the care plan section)    Visit Information  Last PT Received On: 03/24/13 Assistance Needed: +1 History of Present Illness: 73 y.o. 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 Dalbert Batman, secondary to worsening erythema and swelling of the right lower extremity despite being treated with Augmentin and doxycycline. Dr. Dalbert Batman noted some blisters and drainage of the posterior calf and sent him to the ER for further evaluation and admission.    Subjective Data      Cognition  Cognition Arousal/Alertness: Awake/alert Behavior During Therapy: WFL for tasks assessed/performed Overall Cognitive Status: Within Functional Limits for tasks assessed    Balance     End of Session PT - End of Session Activity Tolerance: Patient tolerated treatment well Patient  left: with call bell/phone within reach;in chair;with family/visitor present   GP     Caitlinn Klinker,KATHrine E 03/24/2013, 12:41 PM Carmelia Bake, PT,  DPT 03/24/2013 Pager: [phone]

## 2013-03-24 NOTE — Discharge Instructions (Signed)
Cellulitis Cellulitis is an infection of the skin and the tissue beneath it. The infected area is usually red and tender. Cellulitis occurs most often in the arms and lower legs.  CAUSES  Cellulitis is caused by bacteria that enter the skin through cracks or cuts in the skin. The most common types of bacteria that cause cellulitis are Staphylococcus and Streptococcus. SYMPTOMS   Redness and warmth.  Swelling.  Tenderness or pain.  Fever. DIAGNOSIS  Your caregiver can usually determine what is wrong based on a physical exam. Blood tests may also be done. TREATMENT  Treatment usually involves taking an antibiotic medicine. HOME CARE INSTRUCTIONS   Take your antibiotics as directed. Finish them even if you start to feel better.  Keep the infected arm or leg elevated to reduce swelling.  Apply a warm cloth to the affected area up to 4 times per day to relieve pain.  Only take over-the-counter or prescription medicines for pain, discomfort, or fever as directed by your caregiver.  Keep all follow-up appointments as directed by your caregiver. SEEK MEDICAL CARE IF:   You notice red streaks coming from the infected area.  Your red area gets larger or turns dark in color.  Your bone or joint underneath the infected area becomes painful after the skin has healed.  Your infection returns in the same area or another area.  You notice a swollen bump in the infected area.  You develop new symptoms. SEEK IMMEDIATE MEDICAL CARE IF:   You have a fever.  You feel very sleepy.  You develop vomiting or diarrhea.  You have a general ill feeling (malaise) with muscle aches and pains. MAKE SURE YOU:   Understand these instructions.  Will watch your condition.  Will get help right away if you are not doing well or get worse. Document Released: 11/14/2004 Document Revised: 08/06/2011 Document Reviewed: 04/22/2011 ExitCare Patient Information 2014 [employer].  

## 2013-03-24 NOTE — Discharge Summary (Signed)
Physician Discharge Summary  [name] HKV:[id number] DOB: 02-Sep-1940 DOA: 03/19/2013  PCP: Jani Gravel, MD  Admit date: 03/19/2013 Discharge date: 03/24/2013  Recommendations for Outpatient Follow-up:  1. Please note that you will need to take doxycycline 100 mg twice daily for 9 more days on discharge. This will complete a total of 2 weeks antibiotic treatment for cellulitis. 2. Order was placed for home health PT/OT and RN. RN specifically for daily dressing changes of left lower extremity. Also, patient needs INR and platelet count checked on Friday 03/26/2013 and the result reported to PCP. 3. Platelet count was 51 at the time of discharge. Please check with PCP if you should continue coumadin if platelet count drops. 4. Please hold metformin while being treated for cellulitis. May continue glipizide and saxagliptin 5. May continue losartan 25 mg daily  Discharge Diagnoses:  Principal Problem:   Cellulitis of leg, right Active Problems:   ITP (idiopathic thrombocytopenic purpura)   Anemia   Diabetes mellitus   History of stroke   DVT (deep venous thrombosis)   Other and unspecified hyperlipidemia   Essential hypertension, benign    Discharge Condition: medically stable for discharge home today   Diet recommendation: diabetic diet  History of present illness:  73 y.o. 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 Dalbert Batman, secondary to worsening erythema and swelling of the right lower extremity despite being treated with Augmentin and doxycycline. Dr. Dalbert Batman noted some blisters and drainage of the posterior calf and sent him to the ER for further evaluation and admission.   Assessment/Plan:    Principal Problem:  Cellulitis of right leg  - Patient was admitted and put on IV vancomycin. MRI obtained 03/19/13 showed asymmetric subcutaneous and popliteal edema consistent with cellulitis. No abscess or deeper infection noted.  - wound culture so far moderate staph aureus but not MRSA - pt to continue doxycycline for 9 more days on discharge which would complete 14 days antibiotic treatment - wound care as recommended, daily dressing changes with santyl lotion and apply dry dressing Active Problems:  History of stroke  - may resume aspiring, not on heparin  ITP (idiopathic thrombocytopenic purpura)  - Known chronic thrombocytopenia. Platelet count stable.  Normocytic Anemia  - Appears to be relatively new. Hemoglobin 12.9 mg/dL 01/19/13. Anemia panel shows low iron and normal F64 and folic acid levels.  Diabetes mellitus  - may continue taking Glucotrol and saxagliptin but hold metformin until cellulitis resolves  DVT  - INR therapeutic, d/c heparin drip - may go home with coumadin 5 mg and HHRN to draw blood on 2/6 (friday) and report result to PCP who will then make further recommendations and adjust coumadin dose appropriately; this was all communicated to the pt and his wife and they verbalized understanding and agreement to have RN check the bloos. We also scheduled appt with PCP the following monday Hyperlipidemia  - Continue Lipitor.  Hypertension  - may resume home BP med, BP 141/55  Code Status: Full.  Family Communication: Wife updated at the bedside.   IV access:  Peripheral IV Medical Consultants:  Surgery Other Consultants:  None Anti-infectives:  Vancomycin 03/19/13--->03/23/2013 Doxycycline 03/23/2013 -->     Signed:  Leisa Lenz, MD  [facility] 03/24/2013, 10:57 AM  Pager #: [phone]  Discharge Exam: Filed Vitals:   03/24/13 0649  BP: 126/64  Pulse: 74  Temp: 97.7 F (36.5 C)  Resp: 16   Filed Vitals:   03/23/13 1332 03/23/13 1810  03/23/13 2050 03/24/13 0649  BP: 128/54 131/51 127/53 126/64  Pulse: 73 70 73 74  Temp: 97.7 F (36.5 C) 97.7 F (36.5 C) 98.2 F (36.8 C) 97.7 F (36.5 C)  TempSrc: Oral Oral Oral Oral  Resp: 18 17 16 16   Height:      Weight:      SpO2: 98% 99% 97% 97%    General: Pt is alert, follows commands appropriately, not in acute distress Cardiovascular: Regular rate and rhythm, S1/S2 +, no murmurs, no rubs, no gallops Respiratory: Clear to auscultation bilaterally, no wheezing, no crackles, no rhonchi Abdominal: Soft, non tender, non distended, bowel sounds +, no guarding Extremities: LLE dressing over the cellulitis area, wound area, no cyanosis, pulses palpable bilaterally DP and PT Neuro: Grossly nonfocal  Discharge Instructions  Discharge Orders   Future Appointments Provider Department Dept Phone   04/27/2013 9:45 AM [facility] [place] [phone]   04/27/2013 10:15 AM Owens Shark, NP [facility] [phone]   05/26/2013 10:00 AM [facility] [phone]   05/26/2013 11:00 AM Sharmon Leyden Nickel, NP [facility] [phone]   Future Orders Complete By Expires   Call MD for:  difficulty breathing, headache or visual disturbances  As directed    Call MD for:  persistant dizziness or light-headedness  As directed    Call MD for:  persistant nausea and vomiting  As directed    Call MD for:  severe uncontrolled pain  As directed    Diet - low sodium heart healthy  As directed    Discharge instructions  As directed    Comments:     1. Please note that you will need to take doxycycline 100 mg twice daily for 9 more days on discharge. This will complete a total of 2 weeks antibiotic treatment for cellulitis. 2. Order was placed for home health PT/OT and RN. RN specifically for daily dressing changes of left lower extremity. Also, patient needs INR and  platelet count checked on Friday 03/26/2013 and the result reported to PCP. 3. Platelet count was 51 at the time of discharge. Please check with PCP if you should continue coumadin if platelet count drops. 4. Please hold metformin while being treated for cellulitis. May continue glipizide and saxagliptin 5. May continue losartan 25 mg daily   Increase activity slowly  As directed        Medication List    STOP taking these medications       amoxicillin-clavulanate 875-125 MG per tablet  Commonly known as:  AUGMENTIN     aspirin 81 MG tablet     doxycycline 100 MG capsule  Commonly known as:  VIBRAMYCIN  Replaced by:  doxycycline 100 MG tablet     metFORMIN 1000 MG tablet  Commonly known as:  GLUCOPHAGE      TAKE these medications       acetaminophen 325 MG tablet  Commonly known as:  TYLENOL  Take 2 tablets (650 mg total) by mouth every 6 (six) hours as needed for mild pain (or Fever >/= 101).     alum & mag hydroxide-simeth 200-200-20 MG/5ML suspension  Commonly known as:  MAALOX/MYLANTA  Take  30 mLs by mouth every 6 (six) hours as needed for indigestion or heartburn (dyspepsia).     atorvastatin 40 MG tablet  Commonly known as:  LIPITOR  Take 40 mg by mouth every evening.     collagenase ointment  Commonly known as:  SANTYL  Apply topically daily.     doxycycline 100 MG tablet  Commonly known as:  VIBRA-TABS  Take 1 tablet (100 mg total) by mouth every 12 (twelve) hours.     glipiZIDE 10 MG tablet  Commonly known as:  GLUCOTROL  Take 10 mg by mouth 2 (two) times daily before a meal.     losartan 25 MG tablet  Commonly known as:  COZAAR  Take 25 mg by mouth daily.     ONGLYZA 5 MG Tabs tablet  Generic drug:  saxagliptin HCl  Take 5 mg by mouth daily.     traMADol 50 MG tablet  Commonly known as:  ULTRAM  Take 1 tablet (50 mg total) by mouth every 6 (six) hours as needed for moderate pain.     warfarin 5 MG tablet  Commonly known as:  COUMADIN  Take 1  tablet (5 mg total) by mouth one time only at 6 PM.           Follow-up Information   Follow up with Jani Gravel, MD On 03/29/2013. (@ 11:00am with Zenovia Jarred for hospital discharge follow up)    Specialty:  Internal Medicine   Contact information:   [address] [place] [address] [phone]        The results of significant diagnostics from this hospitalization (including imaging, microbiology, ancillary and laboratory) are listed below for reference.    Significant Diagnostic Studies: Mr Tibia Fibula Right W Wo Contrast  03/20/2013   CLINICAL DATA:  Right lower leg nonhealing wound. Diabetes and hypertension. Recent right popliteal vein thrombosis.  EXAM: MRI OF LOWER RIGHT EXTREMITY WITHOUT AND WITH CONTRAST  TECHNIQUE: Multiplanar, multisequence MR imaging of the lower right extremity was performed both before and after administration of intravenous contrast.  CONTRAST:  48m MULTIHANCE GADOBENATE DIMEGLUMINE 529 MG/ML IV SOLN  COMPARISON:  [place]OUS IMG LOWER  [facility] *R* dated 03/08/2013  FINDINGS: Asymmetric edema tracks in the right popliteal space and superficial to the gastrocnemius musculature. There is low grade edema distally within the medial head of the right gastrocnemius.  Asymmetric subcutaneous edema is present in the right calf and tracking along the superficial fascia margin all the way to the ankle.  No significant osseous abnormality of the right tibia or fibula.  Lobular 3.3 by 1.6 by 1.7 cm focus of high T2 signal and adjacent skin disruption in the right posteromedial mid calf noted on image 33 of series 7. This does not appreciably enhance. No drainable abscess.  IMPRESSION: 1. Asymmetric subcutaneous and popliteal edema with edema tracking along the fascia planes in the right calf. Associated subcutaneous enhancement suggests cellulitis. There may be some low-grade myositis in the medial head of the gastrocnemius muscle distally. Edema tracking  along the deep fascia planes is not associated with enhancement and may be due to the recent DVT rather than necessarily being due to fasciitis. No abscess observed. 2. Focus of increased T2 signal and the posteromedial mid catheterization on the right with associated skin irregularity, possibly reflecting the underlying wound. The associated hypoenhancing subcutaneous region may reflect inflammatory phlegmon or a thrombosed superficial varix.   Electronically Signed   By: WSherryl BartersM.D.  On: 03/20/2013 08:45   US Venous Img Lower Unilateral Right  03/08/2013   CLINICAL DATA:  Redness right lower extremity.  EXAM: Right LOWER EXTREMITY VENOUS DOPPLER ULTRASOUND  TECHNIQUE: Gray-scale sonography with graded compression, as well as color Doppler and duplex ultrasound, were performed to evaluate the deep venous system from the level of the common femoral vein through the popliteal and proximal calf veins. Spectral Doppler was utilized to evaluate flow at rest and with distal augmentation maneuvers.  COMPARISON:  None.  FINDINGS: Thrombus within deep veins: Thrombus is present and the right popliteal vein.  Compressibility of deep veins:  Normal.  Duplex waveform respiratory phasicity:  Normal.  Duplex waveform response to augmentation:  Normal.  Venous reflux:  None visualized.  Other findings:  None visualized.  IMPRESSION: Right popliteal vein thrombosis.   Electronically Signed   By: Marcello Moores  Register   On: 03/08/2013 14:29    Microbiology: Recent Results (from the past 240 hour(s))  CULTURE, ROUTINE-ABSCESS     Status: None   Collection Time    03/20/13 11:02 AM      Result Value Range Status   Specimen Description OTHER POSTERIOR CALF   Final   Special Requests Normal   Final   Gram Stain     Final   Value: FEW WBC PRESENT, PREDOMINANTLY PMN     RARE SQUAMOUS EPITHELIAL CELLS PRESENT     FEW GRAM POSITIVE COCCI IN PAIRS     IN CLUSTERS     Performed at [facility]   Culture      Final   Value: MODERATE STAPHYLOCOCCUS AUREUS     Note: RIFAMPIN AND GENTAMICIN SHOULD NOT BE USED AS SINGLE DRUGS FOR TREATMENT OF STAPH INFECTIONS.     Performed at [facility]   Report Status 03/23/2013 FINAL   Final   Organism ID, Bacteria STAPHYLOCOCCUS AUREUS   Final  MRSA PCR SCREENING     Status: None   Collection Time    03/22/13 11:02 AM      Result Value Range Status   MRSA by PCR NEGATIVE  NEGATIVE Final   Comment:            The GeneXpert MRSA Assay (FDA     approved for NASAL specimens     only), is one component of a     comprehensive MRSA colonization     surveillance program. It is not     intended to diagnose MRSA     infection nor to guide or     monitor treatment for     MRSA infections.     Labs: Basic Metabolic Panel:  Recent Labs Lab 03/19/13 1650 03/20/13 0416 03/23/13 0454  NA 138 140 142  K 4.8 4.2 3.9  CL 102 106 111  CO2 22 24 18*  GLUCOSE 193* 118* 135*  BUN 18 16 10   CREATININE 0.98 0.95 0.90  CALCIUM 9.3 8.7 7.7*   Liver Function Tests:  Recent Labs Lab 03/19/13 1650  AST 47*  ALT 38  ALKPHOS 74  BILITOT 0.7  PROT 6.7  ALBUMIN 2.9*   No results found for this basename: LIPASE, AMYLASE,  in the last 168 hours No results found for this basename: AMMONIA,  in the last 168 hours CBC:  Recent Labs Lab 03/19/13 1650 03/20/13 0416 03/21/13 0424 03/22/13 0420 03/23/13 0454 03/24/13 0410  WBC 4.0 3.2* 2.9* 3.5* 3.9* 3.5*  NEUTROABS 2.9  --   --   --   --   --  HGB 10.6* 9.4* 9.4* 9.8* 9.8* 9.4*  HCT 31.9* 28.2* 28.9* 29.5* 29.1* 28.6*  MCV 92.5 92.2 92.9 93.7 92.1 92.9  PLT 83* 62* 62* 59* 49* 51*   Cardiac Enzymes: No results found for this basename: CKTOTAL, CKMB, CKMBINDEX, TROPONINI,  in the last 168 hours BNP: BNP (last 3 results) No results found for this basename: PROBNP,  in the last 8760 hours CBG:  Recent Labs Lab 03/23/13 0704 03/23/13 1144 03/23/13 1801 03/23/13 2048 03/24/13 0725  GLUCAP  121* 155* 125* 172* 120*    Time coordinating discharge: Over 30 minutes

## 2013-03-24 NOTE — Progress Notes (Signed)
Pt. Was discharged home. He was given his discharge instructions, prescriptions, and all questions were answered.  He was transported home by his wife.

## 2013-04-19 ENCOUNTER — Encounter: Payer: Self-pay | Admitting: Oncology

## 2013-04-27 ENCOUNTER — Other Ambulatory Visit: Payer: Medicare Other

## 2013-04-27 ENCOUNTER — Ambulatory Visit: Payer: Medicare Other | Admitting: Nurse Practitioner

## 2013-05-25 ENCOUNTER — Encounter: Payer: Self-pay | Admitting: Family

## 2013-05-26 ENCOUNTER — Ambulatory Visit (INDEPENDENT_AMBULATORY_CARE_PROVIDER_SITE_OTHER): Payer: Medicare Other | Admitting: Family

## 2013-05-26 ENCOUNTER — Ambulatory Visit (HOSPITAL_COMMUNITY)
Admission: RE | Admit: 2013-05-26 | Discharge: 2013-05-26 | Disposition: A | Discharge status: Home / Self Care | Payer: Medicare Other | Source: Ambulatory Visit | Attending: Family | Admitting: Family

## 2013-05-26 ENCOUNTER — Encounter: Payer: Self-pay | Admitting: Family

## 2013-05-26 VITALS — BP 133/70 | HR 66 | Resp 16 | Ht 70.0 in | Wt 177.0 lb

## 2013-05-26 DIAGNOSIS — I658 Occlusion and stenosis of other precerebral arteries: Secondary | ICD-10-CM | POA: Insufficient documentation

## 2013-05-26 DIAGNOSIS — I6529 Occlusion and stenosis of unspecified carotid artery: Secondary | ICD-10-CM

## 2013-05-26 NOTE — Patient Instructions (Signed)

## 2013-05-26 NOTE — Progress Notes (Signed)
Established Carotid Patient   History of Present Illness  [name] is a 73 y.o. male patient whom Dr. 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 He has had no significant stenosis in the left ICA.  He had a "mild" stroke about 2005 which manifested as dizziness and bilateral arms weakness, but no stroke or TIA symptoms since then, states it was noted on either CT or MRI which also found the carotid artery stenosis.  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 Lajoyce Corners, he had no stents or surgeries for this.  Since he was on the anticoagulants, he has developed low platelets which is managed by Dr. Beryle Beams.  Patient states he has not had any stomach pain since treated for the mesenteric vein thrombosis and his weight has remained the same since treated for this.  He did lose weight before mesenteric vein thrombosis treatment, but gained it back afterward.  Patient denies any cardiac problems or dyspnea.  The patient denies amaurosis fugax or monocular blindness. The patient denies facial drooping.  Pt. denies hemiplegia. The patient denies receptive or expressive aphasia. Patient denies residual weakness in arms.  Patient denies claudication symptoms.   Patient reports New Medical or Surgical History: He was hospitalized at [facility] January, 2015 for cellulitis and DVT in  RLE and ulceration which wife states is healing. He just finished po antibx.  Pt Diabetic: Yes, states his blood sugar runs 130-145 Pt smoker: non-smoker  Pt meds include:  Statin : Yes  Betablocker: No  ASA: stopped during hospitalization for DVT  Other anticoagulants/antiplatelets: on coumadin for DVT   Past Medical History  Diagnosis Date  . Diabetes mellitus 66  . Carotid artery occlusion   . CVA  (cerebral vascular accident)     TIA history  . 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 Leukopenia 08/11/2012  . Mesenteric venous thrombosis 1996  . Blood transfusion without reported diagnosis   . Clotting disorder   . Essential hypertension, benign 03/20/2013  . Cellulitis and abscess of leg 03/19/2013  . DVT (deep venous thrombosis) 03/20/2013    Social History History  Substance Use Topics  . Smoking status: Never Smoker   . Smokeless tobacco: Never Used  . Alcohol Use: No    Family History Family History  Problem Relation Age of Onset  . Alzheimer's disease Father   . Dementia Father   . Cancer Sister   . Diabetes Sister   . Deep vein thrombosis Sister     Varicose vein  . Heart disease Mother     Surgical History Past Surgical History  Procedure Laterality Date  . Eye surgery Right Jun 26, 2012    Cataract  . Eye surgery Left July 27, 2012    Cataract    No Known Allergies  Current Outpatient Prescriptions  Medication Sig Dispense Refill  . atorvastatin (LIPITOR) 40 MG tablet Take 40 mg by mouth every evening.       [name] glipiZIDE (GLUCOTROL) 10 MG tablet Take 10 mg by mouth 2 (two) times daily before a meal.        . losartan (COZAAR) 25 MG tablet Take 25 mg by mouth daily.      . metFORMIN (GLUCOPHAGE) 500 MG tablet daily.      . ONE TOUCH ULTRA TEST test  strip       . saxagliptin HCl (ONGLYZA) 5 MG TABS tablet Take 5 mg by mouth daily.      [name] warfarin (COUMADIN) 5 MG tablet Take 1 tablet (5 mg total) by mouth one time only at 6 PM.  14 tablet  0  . acetaminophen (TYLENOL) 325 MG tablet Take 2 tablets (650 mg total) by mouth every 6 (six) hours as needed for mild pain (or Fever >/= 101).  30 tablet  0  . alum & mag hydroxide-simeth (MAALOX/MYLANTA) 200-200-20 MG/5ML suspension Take 30 mLs by mouth every 6 (six) hours as needed for indigestion or heartburn (dyspepsia).  355 mL  0  . collagenase (SANTYL) ointment Apply topically daily.  15 g  0  .  doxycycline (VIBRA-TABS) 100 MG tablet Take 1 tablet (100 mg total) by mouth every 12 (twelve) hours.  18 tablet  0  . traMADol (ULTRAM) 50 MG tablet Take 1 tablet (50 mg total) by mouth every 6 (six) hours as needed for moderate pain.  30 tablet  0   No current facility-administered medications for this visit.    Review of Systems : See HPI for pertinent positives and negatives.  Physical Examination  Filed Vitals:   05/26/13 1141  BP: 133/70  Pulse: 66  Resp: 16   Filed Weights   05/26/13 1141  Weight: 177 lb (80.287 kg)   Body mass index is 25.4 kg/(m^2).  General: WDWN male in NAD GAIT: slight limp Eyes: PERRLA Pulmonary:  Non-labored, CTAB, Negative  Rales, Negative rhonchi, & Negative wheezing.  Cardiac: regular Rhythm ,  Negative detected murmur.  VASCULAR EXAM Carotid Bruits Left Right   Negative Negative    Radial pulses are 2+ palpable and equal.                                                                                                                            LE Pulses LEFT RIGHT       POPLITEAL  not palpable   not palpable       POSTERIOR TIBIAL   palpable   not palpable     Gastrointestinal: soft, nontender, BS WNL, no r/g,  negative masses.  Musculoskeletal: Negative muscle atrophy/wasting. M/S 5/5 throughout except 4/5 RLE, Extremities without ischemic changes, non pitting swelling, redness, small ulceration lower right leg.  Neurologic: A&O X 3; Appropriate Affect ; SENSATION ;normal;  Speech is normal CN 2-12 intact, Pain and light touch intact in extremities, some hearing loss, Motor exam as listed above.   Non-Invasive Vascular Imaging CAROTID DUPLEX 05/26/2013   Right ICA: 60 - 79 % stenosis. Left ICA: <40% stenosis.  These findings are Unchanged from previous exam.  Assessment: [name] is a 73 y.o. male who presents with asymptomatic moderate/severe right ICA stenosis and minimal left ICA stenosis. The  ICA stenosis is   Unchanged from previous exam.  Plan: Follow-up in 6 months with Carotid Duplex scan.  I discussed in depth with the patient the nature of atherosclerosis, and emphasized the importance of maximal medical management including strict control of blood pressure, blood glucose, and lipid levels, obtaining regular exercise, and continued cessation of smoking.  The patient is aware that without maximal medical management the underlying atherosclerotic disease process will progress, limiting the benefit of any interventions. The patient was given information about stroke prevention and what symptoms should prompt the patient to seek immediate medical care. Thank you for allowing [place] to participate in this patient's care.  Clemon Chambers, RN, MSN, FNP-C [facility] Office: [phone]  Clinic Physician: Early  05/26/2013 11:56 AM

## 2013-05-26 NOTE — Addendum Note (Signed)
Addended by: Dorthula Rue L on: 05/26/2013 04:13 PM   Modules accepted: Orders

## 2013-11-30 ENCOUNTER — Encounter: Payer: Self-pay | Admitting: Family

## 2013-12-01 ENCOUNTER — Ambulatory Visit (HOSPITAL_COMMUNITY)
Admission: RE | Admit: 2013-12-01 | Discharge: 2013-12-01 | Disposition: A | Discharge status: Home / Self Care | Payer: Medicare Other | Source: Ambulatory Visit | Attending: Family | Admitting: Family

## 2013-12-01 ENCOUNTER — Ambulatory Visit (INDEPENDENT_AMBULATORY_CARE_PROVIDER_SITE_OTHER): Payer: Medicare Other | Admitting: Family

## 2013-12-01 ENCOUNTER — Encounter: Payer: Self-pay | Admitting: Family

## 2013-12-01 VITALS — BP 138/64 | HR 63 | Resp 16 | Ht 70.0 in | Wt 180.0 lb

## 2013-12-01 DIAGNOSIS — I6523 Occlusion and stenosis of bilateral carotid arteries: Secondary | ICD-10-CM

## 2013-12-01 DIAGNOSIS — I6529 Occlusion and stenosis of unspecified carotid artery: Secondary | ICD-10-CM | POA: Insufficient documentation

## 2013-12-01 NOTE — Progress Notes (Signed)
Established Carotid Patient   History of Present Illness  [name] is a 73 y.o. male patient whom Dr. 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 He has had no significant stenosis in the left ICA.  He had a "mild" stroke about 2005 which manifested as dizziness and bilateral arms weakness, but no stroke or TIA symptoms since then, states it was noted on either CT or MRI which also found the carotid artery stenosis.  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 Lajoyce Corners, he had no stents or surgeries for this.  Since he was on the anticoagulants, he has developed low platelets which is managed by Dr. Beryle Beams.  Patient states he has not had any stomach pain since treated for the mesenteric vein thrombosis and his weight has remained the same since treated for this.  He did lose weight before mesenteric vein thrombosis treatment, but gained it back afterward.  Patient denies any cardiac problems or dyspnea.  The patient denies amaurosis fugax or monocular blindness. The patient denies facial drooping.  Pt. denies hemiplegia. The patient denies receptive or expressive aphasia. Patient denies residual weakness in arms.  Patient denies claudication symptoms.  Patient reports New Medical or Surgical History: He was hospitalized at [facility] January, 2015 for cellulitis and DVT in RLE and ulceration which wife states is healing. He finished po antibx.   Pt Diabetic: Yes, states his blood sugar runs 115-130 fasting Pt smoker: non-smoker   Pt meds include:  Statin : Yes  Betablocker: No  ASA: stopped during hospitalization for DVT  Other anticoagulants/antiplatelets: on coumadin for DVT   Past Medical History  Diagnosis Date  . Diabetes mellitus 66  . Carotid artery occlusion   . CVA  (cerebral vascular accident)     TIA history  . 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 Leukopenia 08/11/2012  . Mesenteric venous thrombosis 1996  . Blood transfusion without reported diagnosis   . Clotting disorder   . Essential hypertension, benign 03/20/2013  . Cellulitis and abscess of leg 03/19/2013  . DVT (deep venous thrombosis) 03/20/2013    Social History History  Substance Use Topics  . Smoking status: Never Smoker   . Smokeless tobacco: Never Used  . Alcohol Use: No    Family History Family History  Problem Relation Age of Onset  . Alzheimer's disease Father   . Dementia Father   . Cancer Sister   . Diabetes Sister   . Deep vein thrombosis Sister     Varicose vein  . Heart disease Mother     Surgical History Past Surgical History  Procedure Laterality Date  . Eye surgery Right Jun 26, 2012    Cataract  . Eye surgery Left July 27, 2012    Cataract    No Known Allergies  Current Outpatient Prescriptions  Medication Sig Dispense Refill  . acetaminophen (TYLENOL) 325 MG tablet Take 2 tablets (650 mg total) by mouth every 6 (six) hours as needed for mild pain (or Fever >/= 101).  30 tablet  0  . alum & mag hydroxide-simeth (MAALOX/MYLANTA) 200-200-20 MG/5ML suspension Take 30 mLs by mouth every 6 (six) hours as needed for indigestion or heartburn (dyspepsia).  355 mL  0  . atorvastatin (LIPITOR) 40 MG tablet Take 40 mg by mouth every evening.       [name]  collagenase (SANTYL) ointment Apply topically daily.  15 g  0  . doxycycline (VIBRA-TABS) 100 MG tablet Take 1 tablet (100 mg total) by mouth every 12 (twelve) hours.  18 tablet  0  . glipiZIDE (GLUCOTROL) 10 MG tablet Take 10 mg by mouth 2 (two) times daily before a meal.        . losartan (COZAAR) 25 MG tablet Take 25 mg by mouth daily.      . metFORMIN (GLUCOPHAGE) 500 MG tablet daily.      . ONE TOUCH ULTRA TEST test strip       . saxagliptin HCl (ONGLYZA) 5 MG TABS tablet Take 5 mg by mouth  daily.      . traMADol (ULTRAM) 50 MG tablet Take 1 tablet (50 mg total) by mouth every 6 (six) hours as needed for moderate pain.  30 tablet  0  . warfarin (COUMADIN) 5 MG tablet Take 1 tablet (5 mg total) by mouth one time only at 6 PM.  14 tablet  0   No current facility-administered medications for this visit.    Review of Systems : See HPI for pertinent positives and negatives.  Physical Examination  Filed Vitals:   12/01/13 0952 12/01/13 0953  BP: 133/66 138/64  Pulse: 65 63  Resp:  16  Height:  5' 10"  (1.778 m)  Weight:  180 lb (81.647 kg)  SpO2:  100%   Body mass index is 25.83 kg/(m^2).  General: WDWN male in NAD  GAIT: slight limp  Eyes: PERRLA  Pulmonary: Non-labored, CTAB, Negative Rales, Negative rhonchi, & Negative wheezing.  Cardiac: regular Rhythm, no detected murmur.   VASCULAR EXAM  Carotid Bruits  Left  Right    Positive Positive   Radial pulses are 2+ palpable and equal.   LE Pulses  LEFT  RIGHT   POPLITEAL  not palpable  not palpable   POSTERIOR TIBIAL  palpable  not palpable    Gastrointestinal: soft, nontender, BS WNL, no r/g, no palpable masses.  Musculoskeletal: Negative muscle atrophy/wasting. M/S 5/5 throughout except 4/5 RLE, Extremities without ischemic changes, non pitting swelling, redness, small ulceration lower right leg.  Neurologic: A&O X 3; Appropriate Affect ; SENSATION ;normal;  Speech is normal  CN 2-12 intact, Pain and light touch intact in extremities, some hearing loss, Motor exam as listed above.   Non-Invasive Vascular Imaging CAROTID DUPLEX 12/01/2013   CEREBROVASCULAR DUPLEX EVALUATION    INDICATION: Follow-up carotid disease     PREVIOUS INTERVENTION(S):     DUPLEX EXAM:     RIGHT  LEFT  Peak Systolic Velocities (cm/s) End Diastolic Velocities (cm/s) Plaque LOCATION Peak Systolic Velocities (cm/s) End Diastolic Velocities (cm/s) Plaque  164 19  CCA PROXIMAL 174 28   110 20  CCA MID 120 24   86 20  CCA DISTAL  109 26   152 17  ECA 206 22   341 85 HT ICA PROXIMAL 141 33 HT  275 55 HT ICA MID 155 38   111 36  ICA DISTAL 101 29     4 ICA / CCA Ratio (PSV) 1.4  Antegrade  Vertebral Flow Antegrade    Brachial Systolic Pressure (mmHg)   Within normal limits  Brachial Artery Waveforms Within normal limits     Plaque Morphology:  HM = Homogeneous, HT = Heterogeneous, CP = Calcific Plaque, SP = Smooth Plaque, IP = Irregular Plaque  ADDITIONAL FINDINGS:     IMPRESSION: 1. Evidence of 60%-79% stenosis of the right  internal carotid artery. 2. Evidence of <40% stenosis of the left internal carotid artery. 3. Bilateral vertebral artery is antegrade.    Compared to the previous exam:  No significant change compared to prior exam.       Assessment: [name] is a 73 y.o. male who presents with asymptomatic 60%-79% stenosis of the right internal carotid artery and <40% stenosis of the left internal carotid artery. No significant change compared to prior exam.  He had a "mild" stroke about 2005 which manifested as dizziness and bilateral arms weakness, but no stroke or TIA symptoms since then, states it was noted on either CT or MRI which also found the carotid artery stenosis.   Mild venous insufficiency in both lower legs: graduated knee high compression hose, see instructions.   Plan: Follow-up in 6 months with Carotid Duplex scan.   I discussed in depth with the patient the nature of atherosclerosis, and emphasized the importance of maximal medical management including strict control of blood pressure, blood glucose, and lipid levels, obtaining regular exercise, and continued cessation of smoking.  The patient is aware that without maximal medical management the underlying atherosclerotic disease process will progress, limiting the benefit of any interventions. The patient was given information about stroke prevention and what symptoms should prompt the patient to seek immediate medical  care. Thank you for allowing [place] to participate in this patient's care.  Clemon Chambers, RN, MSN, FNP-C [facility] Office: [phone]  Clinic Physician: Scot Dock  12/01/2013 9:23 AM

## 2013-12-01 NOTE — Patient Instructions (Addendum)
Stroke Prevention Some medical conditions and behaviors are associated with an increased chance of having a stroke. You may prevent a stroke by making healthy choices and managing medical conditions. HOW CAN I REDUCE MY RISK OF HAVING A STROKE?   Stay physically active. Get at least 30 minutes of activity on most or all days.  Do not smoke. It may also be helpful to avoid exposure to secondhand smoke.  Limit alcohol use. Moderate alcohol use is considered to be:  No more than 2 drinks per day for men.  No more than 1 drink per day for nonpregnant women.  Eat healthy foods. This involves:  Eating 5 or more servings of fruits and vegetables a day.  Making dietary changes that address high blood pressure (hypertension), high cholesterol, diabetes, or obesity.  Manage your cholesterol levels.  Making food choices that are high in fiber and low in saturated fat, trans fat, and cholesterol may control cholesterol levels.  Take any prescribed medicines to control cholesterol as directed by your health care provider.  Manage your diabetes.  Controlling your carbohydrate and sugar intake is recommended to manage diabetes.  Take any prescribed medicines to control diabetes as directed by your health care provider.  Control your hypertension.  Making food choices that are low in salt (sodium), saturated fat, trans fat, and cholesterol is recommended to manage hypertension.  Take any prescribed medicines to control hypertension as directed by your health care provider.  Maintain a healthy weight.  Reducing calorie intake and making food choices that are low in sodium, saturated fat, trans fat, and cholesterol are recommended to manage weight.  Stop drug abuse.  Avoid taking birth control pills.  Talk to your health care provider about the risks of taking birth control pills if you are over 56 years old, smoke, get migraines, or have ever had a blood clot.  Get evaluated for sleep  disorders (sleep apnea).  Talk to your health care provider about getting a sleep evaluation if you snore a lot or have excessive sleepiness.  Take medicines only as directed by your health care provider.  For some people, aspirin or blood thinners (anticoagulants) are helpful in reducing the risk of forming abnormal blood clots that can lead to stroke. If you have the irregular heart rhythm of atrial fibrillation, you should be on a blood thinner unless there is a good reason you cannot take them.  Understand all your medicine instructions.  Make sure that other conditions (such as anemia or atherosclerosis) are addressed. SEEK IMMEDIATE MEDICAL CARE IF:   You have sudden weakness or numbness of the face, arm, or leg, especially on one side of the body.  Your face or eyelid droops to one side.  You have sudden confusion.  You have trouble speaking (aphasia) or understanding.  You have sudden trouble seeing in one or both eyes.  You have sudden trouble walking.  You have dizziness.  You have a loss of balance or coordination.  You have a sudden, severe headache with no known cause.  You have new chest pain or an irregular heartbeat. Any of these symptoms may represent a serious problem that is an emergency. Do not wait to see if the symptoms will go away. Get medical help at once. Call your local emergency services (911 in [place] Do not drive yourself to the hospital. Document Released: 03/14/2004 Document Revised: 06/21/2013 Document Reviewed: 08/07/2012 [facility] Patient Information 2015 [place], [place]. This information is not intended to replace advice given  to you by your health care provider. Make sure you discuss any questions you have with your health care provider.   Venous Stasis or Chronic Venous Insufficiency Chronic venous insufficiency, also called venous stasis, is a condition that affects the veins in the legs. The condition prevents blood from being pumped  through these veins effectively. Blood may no longer be pumped effectively from the legs back to the heart. This condition can range from mild to severe. With proper treatment, you should be able to continue with an active life. CAUSES  Chronic venous insufficiency occurs when the vein walls become stretched, weakened, or damaged or when valves within the vein are damaged. Some common causes of this include:  High blood pressure inside the veins (venous hypertension).  Increased blood pressure in the leg veins from long periods of sitting or standing.  A blood clot that blocks blood flow in a vein (deep vein thrombosis).  Inflammation of a superficial vein (phlebitis) that causes a blood clot to form. RISK FACTORS Various things can make you more likely to develop chronic venous insufficiency, including:  Family history of this condition.  Obesity.  Pregnancy.  Sedentary lifestyle.  Smoking.  Jobs requiring long periods of standing or sitting in one place.  Being a certain age. Women in their 38s and 36s and men in their 91s are more likely to develop this condition. SIGNS AND SYMPTOMS  Symptoms may include:   Varicose veins.  Skin breakdown or ulcers.  Reddened or discolored skin on the leg.  Brown, smooth, tight, and painful skin just above the ankle, usually on the inside surface (lipodermatosclerosis).  Swelling. DIAGNOSIS  To diagnose this condition, your health care provider will take a medical history and do a physical exam. The following tests may be ordered to confirm the diagnosis:  Duplex ultrasound--A procedure that produces a picture of a blood vessel and nearby organs and also provides information on blood flow through the blood vessel.  Plethysmography--A procedure that tests blood flow.  A venogram, or venography--A procedure used to look at the veins using X-ray and dye. TREATMENT The goals of treatment are to help you return to an active life and to  minimize pain or disability. Treatment will depend on the severity of the condition. Medical procedures may be needed for severe cases. Treatment options may include:   Use of compression stockings. These can help with symptoms and lower the chances of the problem getting worse, but they do not cure the problem.  Sclerotherapy--A procedure involving an injection of a material that "dissolves" the damaged veins. Other veins in the network of blood vessels take over the function of the damaged veins.  Surgery to remove the vein or cut off blood flow through the vein (vein stripping or laser ablation surgery).  Surgery to repair a valve. HOME CARE INSTRUCTIONS   Wear compression stockings as directed by your health care provider.  Only take over-the-counter or prescription medicines for pain, discomfort, or fever as directed by your health care provider.  Follow up with your health care provider as directed. SEEK MEDICAL CARE IF:   You have redness, swelling, or increasing pain in the affected area.  You see a red streak or line that extends up or down from the affected area.  You have a breakdown or loss of skin in the affected area, even if the breakdown is small.  You have an injury to the affected area. SEEK IMMEDIATE MEDICAL CARE IF:   You have  an injury and open wound in the affected area.  Your pain is severe and does not improve with medicine.  You have sudden numbness or weakness in the foot or ankle below the affected area, or you have trouble moving your foot or ankle.  You have a fever or persistent symptoms for more than 2-3 days.  You have a fever and your symptoms suddenly get worse. MAKE SURE YOU:   Understand these instructions.  Will watch your condition.  Will get help right away if you are not doing well or get worse. Document Released: 06/10/2006 Document Revised: 11/25/2012 Document Reviewed: 10/12/2012 [facility] Patient Information 2015 [place], [place].  This information is not intended to replace advice given to you by your health care provider. Make sure you discuss any questions you have with your health care provider.   Measure your lower legs in the morning for graduated knee high compression stockings, 20-30 mm mercury graduated compression, put on in the morning, remove at bedtime.

## 2014-02-03 ENCOUNTER — Other Ambulatory Visit: Payer: Self-pay | Admitting: Internal Medicine

## 2014-02-03 DIAGNOSIS — R634 Abnormal weight loss: Secondary | ICD-10-CM

## 2014-02-07 ENCOUNTER — Ambulatory Visit
Admission: RE | Admit: 2014-02-07 | Discharge: 2014-02-07 | Disposition: A | Discharge status: Home / Self Care | Payer: Medicare Other | Source: Ambulatory Visit | Attending: Internal Medicine | Admitting: Internal Medicine

## 2014-02-07 DIAGNOSIS — R634 Abnormal weight loss: Secondary | ICD-10-CM

## 2014-02-07 MED ORDER — IOHEXOL 300 MG/ML  SOLN
100.0000 mL | Freq: Once | INTRAMUSCULAR | Status: AC | PRN
  Administered 2014-02-07: 100 mL via INTRAVENOUS

## 2014-03-02 DIAGNOSIS — E118 Type 2 diabetes mellitus with unspecified complications: Secondary | ICD-10-CM | POA: Diagnosis not present

## 2014-03-02 DIAGNOSIS — E780 Pure hypercholesterolemia: Secondary | ICD-10-CM | POA: Diagnosis not present

## 2014-03-02 DIAGNOSIS — I10 Essential (primary) hypertension: Secondary | ICD-10-CM | POA: Diagnosis not present

## 2014-03-08 DIAGNOSIS — Z7901 Long term (current) use of anticoagulants: Secondary | ICD-10-CM | POA: Diagnosis not present

## 2014-03-08 DIAGNOSIS — Z86718 Personal history of other venous thrombosis and embolism: Secondary | ICD-10-CM | POA: Diagnosis not present

## 2014-04-19 DIAGNOSIS — Z86718 Personal history of other venous thrombosis and embolism: Secondary | ICD-10-CM | POA: Diagnosis not present

## 2014-04-19 DIAGNOSIS — Z7901 Long term (current) use of anticoagulants: Secondary | ICD-10-CM | POA: Diagnosis not present

## 2014-05-30 DIAGNOSIS — Z86718 Personal history of other venous thrombosis and embolism: Secondary | ICD-10-CM | POA: Diagnosis not present

## 2014-05-30 DIAGNOSIS — E119 Type 2 diabetes mellitus without complications: Secondary | ICD-10-CM | POA: Diagnosis not present

## 2014-05-30 DIAGNOSIS — Z7901 Long term (current) use of anticoagulants: Secondary | ICD-10-CM | POA: Diagnosis not present

## 2014-05-30 DIAGNOSIS — I10 Essential (primary) hypertension: Secondary | ICD-10-CM | POA: Diagnosis not present

## 2014-06-03 DIAGNOSIS — E780 Pure hypercholesterolemia: Secondary | ICD-10-CM | POA: Diagnosis not present

## 2014-06-03 DIAGNOSIS — I10 Essential (primary) hypertension: Secondary | ICD-10-CM | POA: Diagnosis not present

## 2014-06-03 DIAGNOSIS — E119 Type 2 diabetes mellitus without complications: Secondary | ICD-10-CM | POA: Diagnosis not present

## 2014-06-07 ENCOUNTER — Encounter: Payer: Self-pay | Admitting: Family

## 2014-06-08 ENCOUNTER — Encounter: Payer: Self-pay | Admitting: Family

## 2014-06-08 ENCOUNTER — Ambulatory Visit (HOSPITAL_COMMUNITY)
Admission: RE | Admit: 2014-06-08 | Discharge: 2014-06-08 | Disposition: A | Discharge status: Home / Self Care | Payer: Medicare Other | Source: Ambulatory Visit | Attending: Family | Admitting: Family

## 2014-06-08 ENCOUNTER — Ambulatory Visit (INDEPENDENT_AMBULATORY_CARE_PROVIDER_SITE_OTHER): Payer: Medicare Other | Admitting: Family

## 2014-06-08 VITALS — BP 119/63 | HR 65 | Resp 16 | Ht 70.0 in | Wt 167.0 lb

## 2014-06-08 DIAGNOSIS — I6523 Occlusion and stenosis of bilateral carotid arteries: Secondary | ICD-10-CM

## 2014-06-08 NOTE — Patient Instructions (Signed)
Stroke Prevention Some medical conditions and behaviors are associated with an increased chance of having a stroke. You may prevent a stroke by making healthy choices and managing medical conditions. HOW CAN I REDUCE MY RISK OF HAVING A STROKE?   Stay physically active. Get at least 30 minutes of activity on most or all days.  Do not smoke. It may also be helpful to avoid exposure to secondhand smoke.  Limit alcohol use. Moderate alcohol use is considered to be:  No more than 2 drinks per day for men.  No more than 1 drink per day for nonpregnant women.  Eat healthy foods. This involves:  Eating 5 or more servings of fruits and vegetables a day.  Making dietary changes that address high blood pressure (hypertension), high cholesterol, diabetes, or obesity.  Manage your cholesterol levels.  Making food choices that are high in fiber and low in saturated fat, trans fat, and cholesterol may control cholesterol levels.  Take any prescribed medicines to control cholesterol as directed by your health care provider.  Manage your diabetes.  Controlling your carbohydrate and sugar intake is recommended to manage diabetes.  Take any prescribed medicines to control diabetes as directed by your health care provider.  Control your hypertension.  Making food choices that are low in salt (sodium), saturated fat, trans fat, and cholesterol is recommended to manage hypertension.  Take any prescribed medicines to control hypertension as directed by your health care provider.  Maintain a healthy weight.  Reducing calorie intake and making food choices that are low in sodium, saturated fat, trans fat, and cholesterol are recommended to manage weight.  Stop drug abuse.  Avoid taking birth control pills.  Talk to your health care provider about the risks of taking birth control pills if you are over 35 years old, smoke, get migraines, or have ever had a blood clot.  Get evaluated for sleep  disorders (sleep apnea).  Talk to your health care provider about getting a sleep evaluation if you snore a lot or have excessive sleepiness.  Take medicines only as directed by your health care provider.  For some people, aspirin or blood thinners (anticoagulants) are helpful in reducing the risk of forming abnormal blood clots that can lead to stroke. If you have the irregular heart rhythm of atrial fibrillation, you should be on a blood thinner unless there is a good reason you cannot take them.  Understand all your medicine instructions.  Make sure that other conditions (such as anemia or atherosclerosis) are addressed. SEEK IMMEDIATE MEDICAL CARE IF:   You have sudden weakness or numbness of the face, arm, or leg, especially on one side of the body.  Your face or eyelid droops to one side.  You have sudden confusion.  You have trouble speaking (aphasia) or understanding.  You have sudden trouble seeing in one or both eyes.  You have sudden trouble walking.  You have dizziness.  You have a loss of balance or coordination.  You have a sudden, severe headache with no known cause.  You have new chest pain or an irregular heartbeat. Any of these symptoms may represent a serious problem that is an emergency. Do not wait to see if the symptoms will go away. Get medical help at once. Call your local emergency services (911 in [place] Do not drive yourself to the hospital. Document Released: 03/14/2004 Document Revised: 06/21/2013 Document Reviewed: 08/07/2012 [employer] Patient Information 2015 [employer]. This information is not intended to replace advice given   to you by your health care provider. Make sure you discuss any questions you have with your health care provider.  

## 2014-06-08 NOTE — Progress Notes (Signed)
Established Carotid Patient   History of Present Illness  [name] is a 74 y.o. male patient whom Dr. 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 He has had no significant stenosis in the left ICA.  He had a "mild" stroke about 2005 which manifested as dizziness and bilateral arms weakness, but no stroke or TIA symptoms since then, states it was noted on either CT or MRI which also found the carotid artery stenosis.  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 Lajoyce Corners, he had no stents or surgeries for this.  Since he was on the anticoagulants, he has developed low platelets which is managed by Dr. Beryle Beams.  Patient states he has not had any stomach pain since treated for the mesenteric vein thrombosis and his weight has remained the same since treated for this.  He did lose weight before mesenteric vein thrombosis treatment, but gained it back afterward.  Patient denies any cardiac problems or dyspnea.  The patient denies amaurosis fugax or monocular blindness. The patient denies facial drooping.  Pt. denies hemiplegia. The patient denies receptive or expressive aphasia. Patient denies residual weakness in arms.  Patient denies claudication symptoms.  Patient reports New Medical or Surgical History: He was hospitalized at [facility] January, 2015 for cellulitis and DVT in RLE and ulceration which wife states is healing. He finished po antibx.   Pt Diabetic: Yes, states his blood sugar runs 115-130 fasting, states his last A1C was 6.7 Pt smoker: non-smoker   Pt meds include:  Statin : Yes  Betablocker: No  ASA: stopped during hospitalization for DVT  Other anticoagulants/antiplatelets: on coumadin for DVT   Past Medical History  Diagnosis Date  . Diabetes  mellitus 66  . Carotid artery occlusion   . CVA (cerebral vascular accident)     TIA history  . 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 Leukopenia 08/11/2012  . Mesenteric venous thrombosis 1996  . Blood transfusion without reported diagnosis   . Clotting disorder   . Essential hypertension, benign 03/20/2013  . Cellulitis and abscess of leg 03/19/2013  . DVT (deep venous thrombosis) 03/20/2013    Social History History  Substance Use Topics  . Smoking status: Never Smoker   . Smokeless tobacco: Never Used  . Alcohol Use: No    Family History Family History  Problem Relation Age of Onset  . Alzheimer's disease Father   . Dementia Father   . Cancer Sister   . Diabetes Sister   . Deep vein thrombosis Sister     Varicose vein  . Heart disease Mother     After age 63  . Hypertension Mother   . Cancer Son     Panceatic    Surgical History Past Surgical History  Procedure Laterality Date  . Eye surgery Right Jun 26, 2012    Cataract  . Eye surgery Left July 27, 2012    Cataract    No Known Allergies  Current Outpatient Prescriptions  Medication Sig Dispense Refill  . acetaminophen (TYLENOL) 325 MG tablet Take 2 tablets (650 mg total) by mouth every 6 (six) hours as needed for mild pain (or Fever >/= 101). 30 tablet 0  . alum & mag hydroxide-simeth (MAALOX/MYLANTA) 200-200-20 MG/5ML suspension Take 30 mLs by mouth every 6 (six) hours as needed for indigestion  or heartburn (dyspepsia). (Patient not taking: Reported on 06/08/2014) 355 mL 0  . atorvastatin (LIPITOR) 40 MG tablet Take 40 mg by mouth every evening.     . collagenase (SANTYL) ointment Apply topically daily. (Patient not taking: Reported on 06/08/2014) 15 g 0  . doxycycline (VIBRA-TABS) 100 MG tablet Take 1 tablet (100 mg total) by mouth every 12 (twelve) hours. (Patient not taking: Reported on 06/08/2014) 18 tablet 0  . Fish Oil-Cholecalciferol (FISH OIL + D3 PO) Take by mouth.    [name] glipiZIDE  (GLUCOTROL) 10 MG tablet Take 10 mg by mouth 2 (two) times daily before a meal.      . losartan (COZAAR) 25 MG tablet Take 25 mg by mouth daily.    . metFORMIN (GLUCOPHAGE) 500 MG tablet daily.    . ONE TOUCH ULTRA TEST test strip     . saxagliptin HCl (ONGLYZA) 5 MG TABS tablet Take 5 mg by mouth daily.    . traMADol (ULTRAM) 50 MG tablet Take 1 tablet (50 mg total) by mouth every 6 (six) hours as needed for moderate pain. (Patient not taking: Reported on 06/08/2014) 30 tablet 0  . vitamin C (ASCORBIC ACID) 500 MG tablet Take 500 mg by mouth daily.    [name] warfarin (COUMADIN) 5 MG tablet Take 1 tablet (5 mg total) by mouth one time only at 6 PM. (Patient taking differently: Take 2 mg by mouth at bedtime. Take two 2 mg tablets at bedtime) 14 tablet 0   No current facility-administered medications for this visit.    Review of Systems : See HPI for pertinent positives and negatives.  Physical Examination  Filed Vitals:   06/08/14 1038 06/08/14 1042  BP: 126/68 119/63  Pulse: 71 65  Resp:  16  Height:  5' 10"  (1.778 m)  Weight:  167 lb (75.751 kg)  SpO2:  100%   Body mass index is 23.96 kg/(m^2).  General: WDWN male in NAD  GAIT: slight limp  Eyes: PERRLA  Pulmonary: Non-labored, CTAB, Negative Rales, Negative rhonchi, & Negative wheezing.  Cardiac: regular Rhythm, no detected murmur.   VASCULAR EXAM  Carotid Bruits  Left  Right    Positive Positive   Radial pulses are 2+ palpable and equal.   LE Pulses  LEFT  RIGHT   POPLITEAL  not palpable  not palpable   POSTERIOR TIBIAL  palpable  not palpable    Gastrointestinal: soft, nontender, BS WNL, no r/g, no palpable masses .  Musculoskeletal: Negative muscle atrophy/wasting. M/S 5/5 throughout except 4/5 RLE, Extremities without ischemic changes.  Neurologic: A&O X 3; Appropriate Affect, Speech is normal,  CN 2-12 intact, Pain and light touch intact in extremities, some hearing loss, Motor exam as listed  above.         Non-Invasive Vascular Imaging CAROTID DUPLEX 06/08/2014   CEREBROVASCULAR DUPLEX EVALUATION    INDICATION: Carotid artery disease    PREVIOUS INTERVENTION(S): None    DUPLEX EXAM: Carotid duplex    RIGHT  LEFT  Peak Systolic Velocities (cm/s) End Diastolic Velocities (cm/s) Plaque LOCATION Peak Systolic Velocities (cm/s) End Diastolic Velocities (cm/s) Plaque  145 19 - CCA PROXIMAL 152 30 -  108 17 - CCA MID 143 24 -  94 15 CP CCA DISTAL 114 24 HT  188 23 HT ECA 196 25 -  294 67 CP ICA PROXIMAL 162 36 HT  94 19 - ICA MID 123 30 -  88 23 - ICA DISTAL 122 32 -  2.7 ICA / CCA Ratio (PSV) 1.1  Antegrade Vertebral Flow Antegrade  435 Brachial Systolic Pressure (mmHg) 391  Triphasic Brachial Artery Waveforms Triphasic    Plaque Morphology:  HM = Homogeneous, HT = Heterogeneous, CP = Calcific Plaque, SP = Smooth Plaque, IP = Irregular Plaque  ADDITIONAL FINDINGS:     IMPRESSION: 1. 60 - 79% right internal carotid artery stenosis. 2. Less than 40% left internal carotid artery stenosis.    Compared to the previous exam:  No significant change      Assessment: [name] is a 74 y.o. male who presents with asymptomatic 60 - 79% right internal carotid artery stenosis and less than 40% left internal carotid artery stenosis. No significant change from 12/01/13. His atherosclerotic risk factors include controlled DM; fortunately he does not smoke.   Plan: Follow-up in 6 months with Carotid Duplex.   I discussed in depth with the patient the nature of atherosclerosis, and emphasized the importance of maximal medical management including strict control of blood pressure, blood glucose, and lipid levels, obtaining regular exercise, and continued cessation of smoking.  The patient is aware that without maximal medical management the underlying atherosclerotic disease process will progress, limiting the benefit of any interventions. The patient was given  information about stroke prevention and what symptoms should prompt the patient to seek immediate medical care. Thank you for allowing [place] to participate in this patient's care.  Clemon Chambers, RN, MSN, FNP-C [facility] Office: [phone]  Clinic Physician: Scot Dock  06/08/2014 10:56 AM

## 2014-06-13 DIAGNOSIS — Z86718 Personal history of other venous thrombosis and embolism: Secondary | ICD-10-CM | POA: Diagnosis not present

## 2014-06-13 DIAGNOSIS — Z7901 Long term (current) use of anticoagulants: Secondary | ICD-10-CM | POA: Diagnosis not present

## 2014-07-28 DIAGNOSIS — Z7901 Long term (current) use of anticoagulants: Secondary | ICD-10-CM | POA: Diagnosis not present

## 2014-07-28 DIAGNOSIS — Z86718 Personal history of other venous thrombosis and embolism: Secondary | ICD-10-CM | POA: Diagnosis not present

## 2014-08-15 ENCOUNTER — Other Ambulatory Visit: Payer: Self-pay

## 2014-09-08 DIAGNOSIS — Z86718 Personal history of other venous thrombosis and embolism: Secondary | ICD-10-CM | POA: Diagnosis not present

## 2014-09-08 DIAGNOSIS — Z7901 Long term (current) use of anticoagulants: Secondary | ICD-10-CM | POA: Diagnosis not present

## 2014-10-03 DIAGNOSIS — I10 Essential (primary) hypertension: Secondary | ICD-10-CM | POA: Diagnosis not present

## 2014-10-03 DIAGNOSIS — E119 Type 2 diabetes mellitus without complications: Secondary | ICD-10-CM | POA: Diagnosis not present

## 2014-10-06 DIAGNOSIS — Z125 Encounter for screening for malignant neoplasm of prostate: Secondary | ICD-10-CM | POA: Diagnosis not present

## 2014-10-06 DIAGNOSIS — I10 Essential (primary) hypertension: Secondary | ICD-10-CM | POA: Diagnosis not present

## 2014-10-06 DIAGNOSIS — E780 Pure hypercholesterolemia: Secondary | ICD-10-CM | POA: Diagnosis not present

## 2014-10-06 DIAGNOSIS — E119 Type 2 diabetes mellitus without complications: Secondary | ICD-10-CM | POA: Diagnosis not present

## 2014-10-20 DIAGNOSIS — Z7901 Long term (current) use of anticoagulants: Secondary | ICD-10-CM | POA: Diagnosis not present

## 2014-10-20 DIAGNOSIS — Z86718 Personal history of other venous thrombosis and embolism: Secondary | ICD-10-CM | POA: Diagnosis not present

## 2014-10-27 ENCOUNTER — Ambulatory Visit (INDEPENDENT_AMBULATORY_CARE_PROVIDER_SITE_OTHER): Payer: Medicare Other | Admitting: Neurology

## 2014-10-27 ENCOUNTER — Encounter: Payer: Self-pay | Admitting: Neurology

## 2014-10-27 VITALS — BP 147/63 | HR 70 | Ht 70.0 in | Wt 169.5 lb

## 2014-10-27 DIAGNOSIS — R432 Parageusia: Secondary | ICD-10-CM | POA: Diagnosis not present

## 2014-10-27 NOTE — Progress Notes (Signed)
Reason for visit: Taste alteration  Referring physician: Dr. [name] is a 74 y.o. male  History of present illness:  [name] is a 74 year old left-handed white male with a history of taste alteration that began in December 2015. The patient indicates that he had a severe sinus infection that did not initially respond to anitbiotics. Antibiotic treatment was altered, and eventually his sinus issues cleared. The patient noted within several weeks after the sinus infection that his taste seem to be altered. He denies starting any new medications around this time. He has indicated that sweet foods do not taste sweet, they have a bad taste. When he eats meat, everything tastes salty. It is not clear that he has any difficulty discerning odors of different foods from one another. He can tell the difference between chicken and beef when it is cooking. He can smell a perfume. He believes that he does have some ongoing sinus allergies. He denies any headaches, vision changes, or memory disturbance. He denies any speech or swallowing issues. He reports no significant problems with numbness or weakness of the extremities, but he has had some mild gait instability since a stroke several years ago. He has never been a smoker. He is sent to this office for an evaluation.  Past Medical History  Diagnosis Date  . Diabetes mellitus 66  . Carotid artery occlusion   . CVA (cerebral vascular accident)     TIA history  . 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 Leukopenia 08/11/2012  . Mesenteric venous thrombosis 1996  . Blood transfusion without reported diagnosis   . Clotting disorder   . Essential hypertension, benign 03/20/2013  . Cellulitis and abscess of leg 03/19/2013  . DVT (deep venous thrombosis) 03/20/2013  . Hyperlipidemia   . Mitral regurgitation   . Splenomegaly     Past Surgical History  Procedure Laterality Date  . Eye surgery Right Jun 26, 2012   Cataract  . Eye surgery Left July 27, 2012    Cataract    Family History  Problem Relation Age of Onset  . Alzheimer's disease Father   . Dementia Father   . Cancer Sister   . Diabetes Sister   . Deep vein thrombosis Sister     Varicose vein  . Heart disease Mother     After age 28  . Hypertension Mother   . Cancer Son     Panceatic    Social history:  reports that he has never smoked. He has never used smokeless tobacco. He reports that he does not drink alcohol or use illicit drugs.  Medications:  Prior to Admission medications   Medication Sig Start Date End Date Taking? Authorizing Provider  atorvastatin (LIPITOR) 40 MG tablet Take 40 mg by mouth every evening.    Yes Historical Provider, MD  glipiZIDE (GLUCOTROL) 10 MG tablet Take 10 mg by mouth 2 (two) times daily before a meal.     Yes Historical Provider, MD  losartan (COZAAR) 25 MG tablet Take 50 mg by mouth daily.    Yes Historical Provider, MD  metFORMIN (GLUCOPHAGE) 500 MG tablet 1,000 mg daily.  05/20/13  Yes Historical Provider, MD  saxagliptin HCl (ONGLYZA) 5 MG TABS tablet Take 5 mg by mouth daily.   Yes Historical Provider, MD  warfarin (COUMADIN) 2 MG tablet Take 4 mg by mouth daily.   Yes Historical Provider, MD      Allergies  Allergen Reactions  . Pravastatin  Abnormal LFTs/myalgia    ROS:  Out of a complete 14 system review of symptoms, the patient complains only of the following symptoms, and all other reviewed systems are negative.  Weight loss Hearing loss, ringing in the ears Wheezing, snoring Impotence Easy bruising Memory loss, dizziness Change in appetite   Blood pressure 147/63, pulse 70, height 5' 10"  (1.778 m), weight 169 lb 8 oz (76.885 kg).  Physical Exam  General: The patient is alert and cooperative at the time of the examination.  Eyes: Pupils are equal, round, and reactive to light. Discs are flat bilaterally.  Neck: The neck is supple, no carotid bruits are  noted.  Respiratory: The respiratory examination is clear.  Cardiovascular: The cardiovascular examination reveals a regular rate and rhythm, no obvious murmurs or rubs are noted.  Skin: Extremities are without significant edema.  Neurologic Exam  Mental status: The patient is alert and oriented x 3 at the time of the examination. The patient has apparent normal recent and remote memory, with an apparently normal attention span and concentration ability.  Cranial nerves: Facial symmetry is present. There is good sensation of the face to pinprick and soft touch bilaterally. The strength of the facial muscles and the muscles to head turning and shoulder shrug are normal bilaterally. Speech is well enunciated, no aphasia or dysarthria is noted. Extraocular movements are full. Visual fields are full. The tongue is midline, and the patient has symmetric elevation of the soft palate. No obvious hearing deficits are noted.  Motor: The motor testing reveals 5 over 5 strength of all 4 extremities. Good symmetric motor tone is noted throughout.  Sensory: Sensory testing is intact to pinprick, soft touch, vibration sensation, and position sense on all 4 extremities. No evidence of extinction is noted.  Coordination: Cerebellar testing reveals good finger-nose-finger and heel-to-shin bilaterally.  Gait and station: Gait is normal. Tandem gait is normal. Romberg is negative. No drift is seen.  Reflexes: Deep tendon reflexes are symmetric and normal bilaterally. Toes are downgoing bilaterally.   Assessment/Plan:  1. Taste alteration  The patient reports what appears to be a true taste alteration with altered sweet and salty sensations. The patient indicates that his olfactory function is relatively intact, he is able to discern various odors from one another. Taste alteration after a significant sinus infection is not uncommon, but this is usually related to the olfactory aspects of taste. The patient  will be set up for blood work today, and MRI evaluation of the brain will be done. I have asked the patient to initiate taking a multivitamin with zinc and selenium supplementation. The patient will follow-up as needed, I will contact him concerning the results of the above.  Jill Alexanders MD 10/27/2014 7:02 PM  [facility] [address] [place], [place] [address]  Phone [phone] Fax [phone]

## 2014-10-27 NOTE — Patient Instructions (Signed)
   We will check MRI evaluation of the brain. I will check blood work today.

## 2014-10-30 LAB — VITAMIN B12: Vitamin B-12: 751 pg/mL (ref 211–946)

## 2014-10-30 LAB — COPPER, SERUM: COPPER: 106 ug/dL (ref 72–166)

## 2014-10-30 LAB — METHYLMALONIC ACID, SERUM: Methylmalonic Acid: 126 nmol/L (ref 0–378)

## 2014-11-09 ENCOUNTER — Ambulatory Visit
Admission: RE | Admit: 2014-11-09 | Discharge: 2014-11-09 | Disposition: A | Discharge status: Home / Self Care | Payer: Medicare Other | Source: Ambulatory Visit | Attending: Neurology | Admitting: Neurology

## 2014-11-09 DIAGNOSIS — R432 Parageusia: Secondary | ICD-10-CM

## 2014-11-10 ENCOUNTER — Telehealth: Payer: Self-pay | Admitting: Neurology

## 2014-11-10 NOTE — Telephone Encounter (Signed)
  I called the patient. The MRI shows no change from 2008, no obvious source of alteration in taste, no significant sinus disease. Etiology of his taste disturbance is not clear.   MRI brain 11/10/2014:  IMPRESSION:  Abnormal MRI brain (without) demonstrating: 1. 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 2. No acute findings. 3. No significant change from MRI on 11/12/06.

## 2014-12-09 ENCOUNTER — Encounter: Payer: Self-pay | Admitting: Family

## 2014-12-13 ENCOUNTER — Other Ambulatory Visit: Payer: Self-pay | Admitting: *Deleted

## 2014-12-13 DIAGNOSIS — I6523 Occlusion and stenosis of bilateral carotid arteries: Secondary | ICD-10-CM

## 2014-12-14 ENCOUNTER — Ambulatory Visit (INDEPENDENT_AMBULATORY_CARE_PROVIDER_SITE_OTHER): Payer: Medicare Other | Admitting: Family

## 2014-12-14 ENCOUNTER — Encounter: Payer: Self-pay | Admitting: Family

## 2014-12-14 ENCOUNTER — Ambulatory Visit (HOSPITAL_COMMUNITY)
Admission: RE | Admit: 2014-12-14 | Discharge: 2014-12-14 | Disposition: A | Discharge status: Home / Self Care | Payer: Medicare Other | Source: Ambulatory Visit | Attending: Family | Admitting: Family

## 2014-12-14 VITALS — BP 126/63 | HR 59 | Temp 97.0°F | Resp 14 | Ht 70.0 in | Wt 168.0 lb

## 2014-12-14 DIAGNOSIS — I6523 Occlusion and stenosis of bilateral carotid arteries: Secondary | ICD-10-CM | POA: Insufficient documentation

## 2014-12-14 DIAGNOSIS — E119 Type 2 diabetes mellitus without complications: Secondary | ICD-10-CM | POA: Diagnosis not present

## 2014-12-14 DIAGNOSIS — I10 Essential (primary) hypertension: Secondary | ICD-10-CM | POA: Diagnosis not present

## 2014-12-14 DIAGNOSIS — E785 Hyperlipidemia, unspecified: Secondary | ICD-10-CM | POA: Insufficient documentation

## 2014-12-14 NOTE — Patient Instructions (Signed)
Stroke Prevention Some medical conditions and behaviors are associated with an increased chance of having a stroke. You may prevent a stroke by making healthy choices and managing medical conditions. HOW CAN I REDUCE MY RISK OF HAVING A STROKE?   Stay physically active. Get at least 30 minutes of activity on most or all days.  Do not smoke. It may also be helpful to avoid exposure to secondhand smoke.  Limit alcohol use. Moderate alcohol use is considered to be:  No more than 2 drinks per day for men.  No more than 1 drink per day for nonpregnant women.  Eat healthy foods. This involves:  Eating 5 or more servings of fruits and vegetables a day.  Making dietary changes that address high blood pressure (hypertension), high cholesterol, diabetes, or obesity.  Manage your cholesterol levels.  Making food choices that are high in fiber and low in saturated fat, trans fat, and cholesterol may control cholesterol levels.  Take any prescribed medicines to control cholesterol as directed by your health care provider.  Manage your diabetes.  Controlling your carbohydrate and sugar intake is recommended to manage diabetes.  Take any prescribed medicines to control diabetes as directed by your health care provider.  Control your hypertension.  Making food choices that are low in salt (sodium), saturated fat, trans fat, and cholesterol is recommended to manage hypertension.  Ask your health care provider if you need treatment to lower your blood pressure. Take any prescribed medicines to control hypertension as directed by your health care provider.  If you are 18-39 years of age, have your blood pressure checked every 3-5 years. If you are 40 years of age or older, have your blood pressure checked every year.  Maintain a healthy weight.  Reducing calorie intake and making food choices that are low in sodium, saturated fat, trans fat, and cholesterol are recommended to manage  weight.  Stop drug abuse.  Avoid taking birth control pills.  Talk to your health care provider about the risks of taking birth control pills if you are over 35 years old, smoke, get migraines, or have ever had a blood clot.  Get evaluated for sleep disorders (sleep apnea).  Talk to your health care provider about getting a sleep evaluation if you snore a lot or have excessive sleepiness.  Take medicines only as directed by your health care provider.  For some people, aspirin or blood thinners (anticoagulants) are helpful in reducing the risk of forming abnormal blood clots that can lead to stroke. If you have the irregular heart rhythm of atrial fibrillation, you should be on a blood thinner unless there is a good reason you cannot take them.  Understand all your medicine instructions.  Make sure that other conditions (such as anemia or atherosclerosis) are addressed. SEEK IMMEDIATE MEDICAL CARE IF:   You have sudden weakness or numbness of the face, arm, or leg, especially on one side of the body.  Your face or eyelid droops to one side.  You have sudden confusion.  You have trouble speaking (aphasia) or understanding.  You have sudden trouble seeing in one or both eyes.  You have sudden trouble walking.  You have dizziness.  You have a loss of balance or coordination.  You have a sudden, severe headache with no known cause.  You have new chest pain or an irregular heartbeat. Any of these symptoms may represent a serious problem that is an emergency. Do not wait to see if the symptoms will   go away. Get medical help at once. Call your local emergency services (911 in [place] Do not drive yourself to the hospital.   This information is not intended to replace advice given to you by your health care provider. Make sure you discuss any questions you have with your health care provider.   Document Released: 03/14/2004 Document Revised: 02/25/2014 Document Reviewed:  08/07/2012 Elsevier Interactive Patient Education 2016 [employer]  

## 2014-12-14 NOTE — Progress Notes (Signed)
Established Carotid Patient   History of Present Illness  [name] is a 74 y.o. male patient whom Dr. 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 He has had no significant stenosis in the left ICA.  He had a "mild" stroke about 2005 which manifested as dizziness and bilateral arms weakness, but no stroke or TIA symptoms since then, states it was noted on either CT or MRI which also found the carotid artery stenosis.  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 Lajoyce Corners, he had no stents or surgeries for this.  Since he was on the anticoagulants, he has developed low platelets which is managed by Dr. Beryle Beams.  Patient states he has not had any stomach pain since treated for the mesenteric vein thrombosis and his weight has remained the same since treated for this.  He did lose weight before mesenteric vein thrombosis treatment, but gained it back afterward.  Patient denies any cardiac problems or dyspnea.  The patient denies a history of amaurosis fugax or monocular blindness, unilateral facial drooping, hemiplegia, or receptive or expressive aphasia.  He denies claudication symptoms.  The patient reports New Medical or Surgical History: He lost his sense of taste since early 2016, seeing Dr. Jannifer Franklin, neurologist, re this. He was hospitalized at [facility] January, 2015 for cellulitis and DVT in RLE and ulceration which wife states has healed.  Pt Diabetic: Yes, states his blood sugar runs 115-130 fasting, states his last A1C was 6.0 Pt smoker: non-smoker   Pt meds include:  Statin : Yes  Betablocker: No  ASA: stopped during hospitalization for DVT  Other anticoagulants/antiplatelets: on coumadin for DVT   Past Medical History  Diagnosis Date  . Diabetes mellitus  66  . Carotid artery occlusion   . CVA (cerebral vascular accident)     TIA history  . 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 Leukopenia 08/11/2012  . Mesenteric venous thrombosis 1996  . Blood transfusion without reported diagnosis   . Clotting disorder   . Essential hypertension, benign 03/20/2013  . Cellulitis and abscess of leg 03/19/2013  . DVT (deep venous thrombosis) 03/20/2013  . Hyperlipidemia   . Mitral regurgitation   . Splenomegaly     Social History Social History  Substance Use Topics  . Smoking status: Never Smoker   . Smokeless tobacco: Never Used  . Alcohol Use: No    Family History Family History  Problem Relation Age of Onset  . Alzheimer's disease Father   . Dementia Father   . Cancer Sister   . Diabetes Sister   . Deep vein thrombosis Sister     Varicose vein  . Heart disease Mother     After age 58  . Hypertension Mother   . Cancer Son     Panceatic    Surgical History Past Surgical History  Procedure Laterality Date  . Eye surgery Right Jun 26, 2012    Cataract  . Eye surgery Left July 27, 2012    Cataract    Allergies  Allergen Reactions  . Pravastatin     Abnormal LFTs/myalgia    Current Outpatient Prescriptions  Medication Sig Dispense Refill  . atorvastatin (LIPITOR) 40 MG tablet Take 40 mg by mouth every evening.     [name] glipiZIDE (GLUCOTROL) 10 MG tablet Take 10 mg  by mouth 2 (two) times daily before a meal.      . losartan (COZAAR) 25 MG tablet Take 50 mg by mouth daily.     . metFORMIN (GLUCOPHAGE) 500 MG tablet 1,000 mg daily.     . saxagliptin HCl (ONGLYZA) 5 MG TABS tablet Take 5 mg by mouth daily.    [name] warfarin (COUMADIN) 2 MG tablet Take 4 mg by mouth daily.     No current facility-administered medications for this visit.    Review of Systems : See HPI for pertinent positives and negatives.  Physical Examination  Filed Vitals:   12/14/14 1015 12/14/14 1016 12/14/14 1017  BP: 152/64 145/57 126/63   Pulse: 59 59 59  Temp: 97 F (36.1 C)    Resp: 14    Height: 5' 10"  (1.778 m)    Weight: 168 lb (76.204 kg)    SpO2: 100%     Body mass index is 24.11 kg/(m^2).   General: WDWN male in NAD  GAIT: slight limp  Eyes: PERRLA  Pulmonary: Non-labored, CTAB, no rales, no rhonchi, & no wheezing.  Cardiac: regular rhythm, no detected murmur.   VASCULAR EXAM  Carotid Bruits  Left  Right    Positive Positive   Radial pulses are 2+ palpable and equal.   LE Pulses  LEFT  RIGHT   POPLITEAL  not palpable  not palpable   POSTERIOR TIBIAL  palpable  not palpable   DORSALIS PEDIS palpable palpable   Gastrointestinal: soft, nontender, BS WNL, no r/g, no palpable masses .  Musculoskeletal: Negative muscle atrophy/wasting. M/S 5/5 throughout except 4/5 RLE, Extremities without ischemic changes.  Neurologic: A&O X 3; Appropriate Affect, Speech is normal,  CN 2-12 intact, Pain and light touch intact in extremities, some hearing loss, Motor exam as listed above.               Non-Invasive Vascular Imaging CAROTID DUPLEX 12/14/2014   Right ICA: 60 - 79 % stenosis. Left ICA: 1 - 39 % stenosis. Mildly resistive flow in the bilateral carotid arteries. Bilateral vertebral arteries are antegrade. No significant change compared to 06/08/14.   Assessment: [name] is a 74 y.o. male who had a stroke in 2005, has no residual neurological deficits, and has had no subsequent stroke or TIA. Today's carotid duplex suggests moderate/severe right ICA stenosis and minimal left ICA stenosis. Mildly resistive flow in the bilateral carotid arteries. Bilateral vertebral arteries are antegrade. No significant change compared to 06/08/14.    Plan: Follow-up in 6 months with Carotid Duplex scan.   I discussed in depth with the patient the nature of atherosclerosis, and emphasized the importance of maximal medical management including strict control  of blood pressure, blood glucose, and lipid levels, obtaining regular exercise, and continued cessation of smoking.  The patient is aware that without maximal medical management the underlying atherosclerotic disease process will progress, limiting the benefit of any interventions. The patient was given information about stroke prevention and what symptoms should prompt the patient to seek immediate medical care. Thank you for allowing [place] to participate in this patient's care.  Clemon Chambers, RN, MSN, FNP-C [facility] [place] Office: [phone]  Clinic Physician: Scot Dock  12/14/2014 9:51 AM

## 2014-12-14 NOTE — Addendum Note (Signed)
Addended by: Mena Goes on: 12/14/2014 02:39 PM   Modules accepted: Orders

## 2014-12-15 DIAGNOSIS — Z86718 Personal history of other venous thrombosis and embolism: Secondary | ICD-10-CM | POA: Diagnosis not present

## 2014-12-15 DIAGNOSIS — Z7901 Long term (current) use of anticoagulants: Secondary | ICD-10-CM | POA: Diagnosis not present

## 2014-12-15 DIAGNOSIS — E119 Type 2 diabetes mellitus without complications: Secondary | ICD-10-CM | POA: Diagnosis not present

## 2015-01-26 DIAGNOSIS — Z7901 Long term (current) use of anticoagulants: Secondary | ICD-10-CM | POA: Diagnosis not present

## 2015-01-26 DIAGNOSIS — Z86718 Personal history of other venous thrombosis and embolism: Secondary | ICD-10-CM | POA: Diagnosis not present

## 2015-02-07 DIAGNOSIS — I10 Essential (primary) hypertension: Secondary | ICD-10-CM | POA: Diagnosis not present

## 2015-02-07 DIAGNOSIS — Z125 Encounter for screening for malignant neoplasm of prostate: Secondary | ICD-10-CM | POA: Diagnosis not present

## 2015-02-07 DIAGNOSIS — E119 Type 2 diabetes mellitus without complications: Secondary | ICD-10-CM | POA: Diagnosis not present

## 2015-02-22 DIAGNOSIS — I10 Essential (primary) hypertension: Secondary | ICD-10-CM | POA: Diagnosis not present

## 2015-02-22 DIAGNOSIS — E119 Type 2 diabetes mellitus without complications: Secondary | ICD-10-CM | POA: Diagnosis not present

## 2015-02-22 DIAGNOSIS — E7800 Pure hypercholesterolemia, unspecified: Secondary | ICD-10-CM | POA: Diagnosis not present

## 2015-02-22 DIAGNOSIS — R05 Cough: Secondary | ICD-10-CM | POA: Diagnosis not present

## 2015-02-27 DIAGNOSIS — Z86718 Personal history of other venous thrombosis and embolism: Secondary | ICD-10-CM | POA: Diagnosis not present

## 2015-02-27 DIAGNOSIS — Z7901 Long term (current) use of anticoagulants: Secondary | ICD-10-CM | POA: Diagnosis not present

## 2015-03-01 ENCOUNTER — Encounter: Payer: Self-pay | Admitting: Internal Medicine

## 2015-03-22 DIAGNOSIS — R3121 Asymptomatic microscopic hematuria: Secondary | ICD-10-CM | POA: Diagnosis not present

## 2015-03-22 DIAGNOSIS — Z0000 Encounter for general adult medical examination without abnormal findings: Secondary | ICD-10-CM | POA: Diagnosis not present

## 2015-03-28 DIAGNOSIS — R3121 Asymptomatic microscopic hematuria: Secondary | ICD-10-CM | POA: Diagnosis not present

## 2015-03-28 DIAGNOSIS — K7460 Unspecified cirrhosis of liver: Secondary | ICD-10-CM | POA: Diagnosis not present

## 2015-03-30 DIAGNOSIS — Z7901 Long term (current) use of anticoagulants: Secondary | ICD-10-CM | POA: Diagnosis not present

## 2015-03-30 DIAGNOSIS — Z86718 Personal history of other venous thrombosis and embolism: Secondary | ICD-10-CM | POA: Diagnosis not present

## 2015-04-27 DIAGNOSIS — Z86718 Personal history of other venous thrombosis and embolism: Secondary | ICD-10-CM | POA: Diagnosis not present

## 2015-04-27 DIAGNOSIS — Z7901 Long term (current) use of anticoagulants: Secondary | ICD-10-CM | POA: Diagnosis not present

## 2015-05-10 DIAGNOSIS — Z0000 Encounter for general adult medical examination without abnormal findings: Secondary | ICD-10-CM | POA: Diagnosis not present

## 2015-05-10 DIAGNOSIS — R3121 Asymptomatic microscopic hematuria: Secondary | ICD-10-CM | POA: Diagnosis not present

## 2015-05-11 ENCOUNTER — Encounter: Payer: Self-pay | Admitting: Gastroenterology

## 2015-06-07 ENCOUNTER — Encounter: Payer: Self-pay | Admitting: Vascular Surgery

## 2015-06-08 DIAGNOSIS — Z7901 Long term (current) use of anticoagulants: Secondary | ICD-10-CM | POA: Diagnosis not present

## 2015-06-08 DIAGNOSIS — Z86718 Personal history of other venous thrombosis and embolism: Secondary | ICD-10-CM | POA: Diagnosis not present

## 2015-06-14 ENCOUNTER — Ambulatory Visit (HOSPITAL_COMMUNITY)
Admission: RE | Admit: 2015-06-14 | Discharge: 2015-06-14 | Disposition: A | Discharge status: Home / Self Care | Payer: Medicare Other | Source: Ambulatory Visit | Attending: Vascular Surgery | Admitting: Vascular Surgery

## 2015-06-14 ENCOUNTER — Ambulatory Visit (INDEPENDENT_AMBULATORY_CARE_PROVIDER_SITE_OTHER): Payer: Medicare Other | Admitting: Vascular Surgery

## 2015-06-14 ENCOUNTER — Encounter: Payer: Self-pay | Admitting: Vascular Surgery

## 2015-06-14 VITALS — BP 134/47 | HR 72 | Ht 70.0 in | Wt 167.5 lb

## 2015-06-14 DIAGNOSIS — I10 Essential (primary) hypertension: Secondary | ICD-10-CM | POA: Insufficient documentation

## 2015-06-14 DIAGNOSIS — E785 Hyperlipidemia, unspecified: Secondary | ICD-10-CM | POA: Diagnosis not present

## 2015-06-14 DIAGNOSIS — E119 Type 2 diabetes mellitus without complications: Secondary | ICD-10-CM | POA: Diagnosis not present

## 2015-06-14 DIAGNOSIS — I6523 Occlusion and stenosis of bilateral carotid arteries: Secondary | ICD-10-CM

## 2015-06-14 NOTE — Progress Notes (Signed)
[facility]  Patient name: [name] MRN: [id number] DOB: 1940/09/15 Sex: male  REASON FOR VISIT: follow up of carotid disease.  HPI: [name] is a 75 y.o. male who was last seen in our office on 12/14/2014 by the nurse practitioner. At that time the patient had a 60-79% right carotid stenosis and a less than 39% left carotid stenosis. Patient comes in for a 6 month follow up visit.  Since we saw him last, he denies any history of stroke, TIAs, expressive or receptive aphasia, or amaurosis fugax. He is on Coumadin and for this reason he is not on aspirin. He is on a statin.  Past Medical History  Diagnosis Date  . Diabetes mellitus 66  . Carotid artery occlusion   . CVA (cerebral vascular accident) ([name])     TIA history  . 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 Leukopenia 08/11/2012  . Mesenteric venous thrombosis 1996  . Blood transfusion without reported diagnosis   . Clotting disorder ([place])   . Essential hypertension, benign 03/20/2013  . Cellulitis and abscess of leg 03/19/2013  . DVT (deep venous thrombosis) ([name]) 03/20/2013  . Hyperlipidemia   . Mitral regurgitation   . Splenomegaly     Family History  Problem Relation Age of Onset  . Alzheimer's disease Father   . Dementia Father   . Cancer Sister   . Diabetes Sister   . Deep vein thrombosis Sister     Varicose vein  . Heart disease Mother     After age 79  . Hypertension Mother   . Cancer Son     Panceatic    SOCIAL HISTORY: Social History  Substance Use Topics  . Smoking status: Never Smoker   . Smokeless tobacco: Never Used  . Alcohol Use: No    Allergies  Allergen Reactions  . Pravastatin     Abnormal LFTs/myalgia    Current Outpatient Prescriptions  Medication Sig Dispense Refill  . atorvastatin (LIPITOR) 40 MG tablet Take 40 mg by mouth every evening.     [name] glipiZIDE (GLUCOTROL) 10 MG tablet Take 10 mg by mouth 2 (two) times daily  before a meal.      . losartan (COZAAR) 25 MG tablet Take 50 mg by mouth daily.     . metFORMIN (GLUCOPHAGE) 500 MG tablet 1,000 mg daily.     . saxagliptin HCl (ONGLYZA) 5 MG TABS tablet Take 5 mg by mouth daily.    [name] warfarin (COUMADIN) 2 MG tablet Take 4 mg by mouth daily.     No current facility-administered medications for this visit.    REVIEW OF SYSTEMS:  [X]  denotes positive finding, [ ]  denotes negative finding Cardiac  Comments:  Chest pain or chest pressure:    Shortness of breath upon exertion:    Short of breath when lying flat:    Irregular heart rhythm:        Vascular    Pain in calf, thigh, or hip brought on by ambulation:    Pain in feet at night that wakes you up from your sleep:     Blood clot in your veins:    Leg swelling:         Pulmonary    Oxygen at home:    Productive cough:     Wheezing:         Neurologic    Sudden weakness in arms or legs:     Sudden numbness in  arms or legs:     Sudden onset of difficulty speaking or slurred speech:    Temporary loss of vision in one eye:     Problems with dizziness:         Gastrointestinal    Blood in stool:     Vomited blood:         Genitourinary    Burning when urinating:     Blood in urine:        Psychiatric    Major depression:         Hematologic    Bleeding problems:    Problems with blood clotting too easily:        Skin    Rashes or ulcers:        Constitutional    Fever or chills:      PHYSICAL EXAM: Filed Vitals:   06/14/15 1112 06/14/15 1115  BP: 151/65 134/47  Pulse: 72   Height: 5' 10"  (1.778 m)   Weight: 167 lb 8 oz (75.978 kg)   SpO2: 100%     GENERAL: The patient is a well-nourished male, in no acute distress. The vital signs are documented above. CARDIAC: There is a regular rate and rhythm.  VASCULAR: he has bilateral carotid bruits. Both feet are warm and well-perfused. PULMONARY: There is good air exchange bilaterally without wheezing or rales. ABDOMEN: Soft  and non-tender with normal pitched bowel sounds.  MUSCULOSKELETAL: There are no major deformities or cyanosis. NEUROLOGIC: No focal weakness or paresthesias are detected. SKIN: There are no ulcers or rashes noted. PSYCHIATRIC: The patient has a normal affect.  DATA:   CAROTID DUPLEX: I then individually interpreted his carotid duplex scan. On the right side there is a 60-79% right carotid stenosis. On the left side there is no significant stenosis. Vertebral arteries are patent with antegrade flow.  MEDICAL ISSUES:  RIGHT CAROTID STENOSIS: The patient has in a systematic 60-79% right carotid stenosis. There is no significant stenosis on the left. I've explained that we would consider right carotid endarterectomy if the stenosis progressed to greater than 80%, or if he developed right hemispheric symptoms. I've ordered a follow up carotid duplex scan in 6 months and I'll see him back at that time. He knows to call sooner if he has problems.  Deitra Mayo [facility] [place]: [phone]

## 2015-06-14 NOTE — Progress Notes (Signed)
Duplicate note (deleted).

## 2015-07-05 ENCOUNTER — Other Ambulatory Visit: Payer: Medicare Other

## 2015-07-05 ENCOUNTER — Other Ambulatory Visit: Payer: Self-pay

## 2015-07-05 ENCOUNTER — Encounter: Payer: Self-pay | Admitting: Gastroenterology

## 2015-07-05 ENCOUNTER — Telehealth: Payer: Self-pay | Admitting: Gastroenterology

## 2015-07-05 ENCOUNTER — Other Ambulatory Visit (INDEPENDENT_AMBULATORY_CARE_PROVIDER_SITE_OTHER): Payer: Medicare Other

## 2015-07-05 ENCOUNTER — Ambulatory Visit (INDEPENDENT_AMBULATORY_CARE_PROVIDER_SITE_OTHER): Payer: Medicare Other | Admitting: Gastroenterology

## 2015-07-05 VITALS — BP 134/50 | HR 80 | Ht 69.5 in | Wt 167.0 lb

## 2015-07-05 DIAGNOSIS — I8510 Secondary esophageal varices without bleeding: Secondary | ICD-10-CM

## 2015-07-05 DIAGNOSIS — I8500 Esophageal varices without bleeding: Secondary | ICD-10-CM | POA: Insufficient documentation

## 2015-07-05 DIAGNOSIS — K7469 Other cirrhosis of liver: Secondary | ICD-10-CM | POA: Diagnosis not present

## 2015-07-05 DIAGNOSIS — K7460 Unspecified cirrhosis of liver: Secondary | ICD-10-CM | POA: Diagnosis not present

## 2015-07-05 DIAGNOSIS — Z7901 Long term (current) use of anticoagulants: Secondary | ICD-10-CM

## 2015-07-05 LAB — IBC PANEL
Iron: 69 ug/dL (ref 42–165)
Saturation Ratios: 17.4 % — ABNORMAL LOW (ref 20.0–50.0)
TRANSFERRIN: 284 mg/dL (ref 212.0–360.0)

## 2015-07-05 LAB — PROTIME-INR
INR: 2.3 ratio — ABNORMAL HIGH (ref 0.8–1.0)
Prothrombin Time: 24.3 s — ABNORMAL HIGH (ref 9.6–13.1)

## 2015-07-05 LAB — FERRITIN: Ferritin: 21.6 ng/mL — ABNORMAL LOW (ref 22.0–322.0)

## 2015-07-05 NOTE — Telephone Encounter (Signed)
The ordered blood tests will check for chronic viral infections that could be related to a blood transfusion

## 2015-07-05 NOTE — Patient Instructions (Addendum)
Your physician has requested that you go to the basement for lab work before leaving today.  Do not take more than 2 grams of Tylenol/acetomenophin daily.   Normal BMI (Body Mass Index- based on height and weight) is between 23 and 30. Your BMI today is Body mass index is 24.32 kg/(m^2). [name] Please consider follow up  regarding your BMI with your Primary Care Provider.  Please call back in one month to schedule your 3 month follow visit with Dr. Fuller Plan.  Thank you for choosing me and [facility].  Pricilla Riffle. Dagoberto Ligas., MD., Marval Regal

## 2015-07-05 NOTE — Telephone Encounter (Signed)
Patient's wife wanted Dr. Fuller Plan to know that in 1998 patient had a blood transfusion x 2.  She is wanting Dr. Fuller Plan to know "in case there was something in that blood that took this long to activate and give him cirrhosis".  She said that they forgot to mention that this morning at the office visit.

## 2015-07-05 NOTE — Progress Notes (Signed)
History of Present Illness: This is a 75 year old male referred by Irine Seal, MD for the evaluation of an abnormal CT scan of the liver suggesting cirrhosis. The patient is accompanied by his wife. He underwent colonoscopy by Dr. Lajoyce Corners in May 2010 showing multiple right colon AVMs, a small ulceration in the cecum, a small cecal polyp and internal hemorrhoids. Pathology results revealed moderately active colitis with focal ulceration and no evidence of precancerous polyp. Patient states he may have had another colonoscopy performed since that time possibly by [facility] but he is uncertain. He is unable to give many details about his history and his wife provides some additional details. 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 This problem has been present since the late 1990s. Prior CT studies did not show any hepatic abnormalities however a CT scan performed on 03/28/2015 showed a shrunken and nodular contour liver compatible with cirrhosis. The patient denies any history of liver disease, jaundice and denies any history of alcohol usage. There is no family history of liver disease. Patient is maintained on Coumadin for history of DVTs. Denies weight loss, abdominal pain, constipation, diarrhea, change in stool caliber, melena, hematochezia, nausea, vomiting, dysphagia, reflux symptoms, chest pain.  Review of Systems: Pertinent positive and negative review of systems were noted in the above HPI section. All other review of systems were otherwise negative.  Current Medications, Allergies, Past Medical History, Past Surgical History, Family History and Social History were reviewed in [employer] record.  Physical Exam: General: Well developed, well nourished, no acute distress Head: Normocephalic and  atraumatic Eyes:  sclerae anicteric, EOMI Ears: Normal auditory acuity Mouth: No deformity or lesions Neck: Supple, no masses or thyromegaly Lungs: Clear throughout to auscultation Heart: Regular rate and rhythm; no murmurs, rubs or bruits Abdomen: Soft, non tender and non distended. No masses, hepatosplenomegaly or hernias noted. Normal Bowel sounds Musculoskeletal: Symmetrical with no gross deformities  Skin: No lesions on visible extremities Pulses:  Normal pulses noted Extremities: No clubbing, cyanosis, edema or deformities noted Neurological: Alert oriented x 4, grossly nonfocal Cervical Nodes:  No significant cervical adenopathy Inguinal Nodes: No significant inguinal adenopathy Psychological:  Alert and cooperative. Normal mood and affect  Assessment and Recommendations:  1. Cirrhosis based on a small liver with nodular contour on recent CT scan. Prior to this diagnosis he had chronic portal vein thrombosis as listed in problem #2. Prior CT scans, occluding one in December 2015, have not shown a cirrhotic liver. Etiology of his cirrhosis is unclear however, given history of diabetes mellitus and age, NASH is likely. Will not be possible to assess hepatic synthetic dysfunction given that he is maintained on Coumadin. Obtain all standard serologies for viral and metabolic liver diseases. If he is not immune to hepatitis A and B plan to proceed with vaccination. No more than 2 g of acetaminophen daily. Do not use alcohol. Return office visit in 6 weeks.  2. 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 He is at higher risk for gastrointestinal bleeding given his esophageal varices. The risks and benefits to chronic Coumadin anticoagulation will need to be reviewed by the physician prescribing Coumadin. Consider beginning Nadolol to reduce  portal pressures. Consider EGD to further assess known varices. Will discuss at his return office  visit.  3. Bilateral carotid artery stenoses, right greater than left.  4. History of ITP followed by Dr. Beryle Beams. Hypersplenism may be contributing to or causing his thrombocytopenia and leukopenia.  5. Cholelithiasis, asymptomatic.  6. Colonic AVMs.  7. Chronic anticoagulation on Coumadin. See problem #2.     cc: Irine Seal, MD [place], [place] [id number]   Jani Gravel, M.D.

## 2015-07-06 ENCOUNTER — Other Ambulatory Visit: Payer: Self-pay

## 2015-07-06 DIAGNOSIS — E611 Iron deficiency: Secondary | ICD-10-CM

## 2015-07-06 LAB — HEPATITIS B SURFACE ANTIBODY,QUALITATIVE: Hep B S Ab: NEGATIVE

## 2015-07-06 LAB — HEPATITIS A ANTIBODY, TOTAL
HEP A TOTAL AB: REACTIVE — AB
Hep A Total Ab: REACTIVE — AB

## 2015-07-06 LAB — HEPATITIS B SURFACE ANTIGEN: Hepatitis B Surface Ag: NEGATIVE

## 2015-07-06 LAB — HEPATITIS C ANTIBODY: HCV AB: NEGATIVE

## 2015-07-06 LAB — ANA: Anti Nuclear Antibody(ANA): NEGATIVE

## 2015-07-07 ENCOUNTER — Other Ambulatory Visit (INDEPENDENT_AMBULATORY_CARE_PROVIDER_SITE_OTHER): Payer: Medicare Other

## 2015-07-07 ENCOUNTER — Other Ambulatory Visit: Payer: Self-pay

## 2015-07-07 ENCOUNTER — Encounter: Payer: Self-pay | Admitting: Gastroenterology

## 2015-07-07 DIAGNOSIS — E611 Iron deficiency: Secondary | ICD-10-CM | POA: Diagnosis not present

## 2015-07-07 DIAGNOSIS — D509 Iron deficiency anemia, unspecified: Secondary | ICD-10-CM

## 2015-07-07 LAB — CBC WITH DIFFERENTIAL/PLATELET
BASOS ABS: 0 10*3/uL (ref 0.0–0.1)
Basophils Relative: 0.5 % (ref 0.0–3.0)
EOS ABS: 0.1 10*3/uL (ref 0.0–0.7)
Eosinophils Relative: 1.3 % (ref 0.0–5.0)
HEMATOCRIT: 32.2 % — AB (ref 39.0–52.0)
HEMOGLOBIN: 10.9 g/dL — AB (ref 13.0–17.0)
LYMPHS PCT: 25.1 % (ref 12.0–46.0)
Lymphs Abs: 1 10*3/uL (ref 0.7–4.0)
MCHC: 33.8 g/dL (ref 30.0–36.0)
MCV: 92.6 fl (ref 78.0–100.0)
Monocytes Absolute: 0.3 10*3/uL (ref 0.1–1.0)
Monocytes Relative: 7.8 % (ref 3.0–12.0)
Neutro Abs: 2.7 10*3/uL (ref 1.4–7.7)
Neutrophils Relative %: 65.3 % (ref 43.0–77.0)
RBC: 3.48 Mil/uL — ABNORMAL LOW (ref 4.22–5.81)
RDW: 16.1 % — ABNORMAL HIGH (ref 11.5–15.5)
WBC: 4.1 10*3/uL (ref 4.0–10.5)

## 2015-07-07 LAB — CERULOPLASMIN: Ceruloplasmin: 22 mg/dL (ref 18–36)

## 2015-07-07 LAB — ANTI-SMOOTH MUSCLE ANTIBODY, IGG

## 2015-07-07 LAB — ALPHA-1-ANTITRYPSIN: A-1 Antitrypsin, Ser: 139 mg/dL (ref 83–199)

## 2015-07-07 LAB — MITOCHONDRIAL ANTIBODIES: Mitochondrial M2 Ab, IgG: <=20 Units (ref <=20.0)

## 2015-07-07 MED ORDER — FERROUS SULFATE 325 (65 FE) MG PO TBEC: 325.0000 mg | DELAYED_RELEASE_TABLET | Freq: Two times a day (BID) | ORAL | Status: DC

## 2015-07-14 ENCOUNTER — Telehealth: Payer: Self-pay | Admitting: Oncology

## 2015-07-14 NOTE — Telephone Encounter (Signed)
Forwarded referral to [facility] via fax due to pt being a former pt.

## 2015-07-18 NOTE — Addendum Note (Signed)
Addended by: Mena Goes on: 07/18/2015 04:51 PM   Modules accepted: Orders

## 2015-07-25 DIAGNOSIS — Z7901 Long term (current) use of anticoagulants: Secondary | ICD-10-CM | POA: Diagnosis not present

## 2015-07-25 DIAGNOSIS — Z86718 Personal history of other venous thrombosis and embolism: Secondary | ICD-10-CM | POA: Diagnosis not present

## 2015-07-26 ENCOUNTER — Telehealth: Payer: Self-pay

## 2015-07-26 ENCOUNTER — Ambulatory Visit (INDEPENDENT_AMBULATORY_CARE_PROVIDER_SITE_OTHER): Payer: Medicare Other | Admitting: Gastroenterology

## 2015-07-26 ENCOUNTER — Encounter: Payer: Self-pay | Admitting: Gastroenterology

## 2015-07-26 VITALS — BP 130/50 | HR 60 | Ht 69.5 in | Wt 169.0 lb

## 2015-07-26 DIAGNOSIS — D696 Thrombocytopenia, unspecified: Secondary | ICD-10-CM | POA: Diagnosis not present

## 2015-07-26 DIAGNOSIS — Z23 Encounter for immunization: Secondary | ICD-10-CM | POA: Diagnosis not present

## 2015-07-26 DIAGNOSIS — D509 Iron deficiency anemia, unspecified: Secondary | ICD-10-CM

## 2015-07-26 DIAGNOSIS — K5520 Angiodysplasia of colon without hemorrhage: Secondary | ICD-10-CM

## 2015-07-26 DIAGNOSIS — K7460 Unspecified cirrhosis of liver: Secondary | ICD-10-CM | POA: Diagnosis not present

## 2015-07-26 NOTE — Progress Notes (Signed)
    History of Present Illness: This is a 75 year old male returning for follow-up of cirrhosis and iron deficiency. He is accompanied by his wife. He has no gastrointestinal complaints today. We reviewed the results of his evaluation to date in detail.  Current Medications, Allergies, Past Medical History, Past Surgical History, Family History and Social History were reviewed in [employer] record.  Physical Exam: General: Well developed, well nourished, no acute distress Head: Normocephalic and atraumatic Eyes:  sclerae anicteric, EOMI Ears: Normal auditory acuity Mouth: No deformity or lesions Lungs: Clear throughout to auscultation Heart: Regular rate and rhythm; no murmurs, rubs or bruits Abdomen: Soft, non tender and non distended. No masses, hepatosplenomegaly or hernias noted. Normal Bowel sounds Musculoskeletal: Symmetrical with no gross deformities  Pulses:  Normal pulses noted Extremities: No clubbing, cyanosis, edema or deformities noted Neurological: Alert oriented x 4, grossly nonfocal Psychological:  Alert and cooperative. Normal mood and affect  Assessment and Recommendations:  1. Cirrhosis based on a small liver with nodular contour on recent CT scan. Prior CT scans, including December 2015, have not shown a cirrhotic liver. Hepatic serologies were negative. Etiology of his cirrhosis is unclear, however, given history of diabetes mellitus and no other obvious etiologies, NASH is very likely. Hepatitis A and B vaccination. No more than 2 g of acetaminophen daily. Abd [place] in 6 months. Do not use alcohol. Return office visit in 3 months.  2. 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 He is at higher risk for gastrointestinal bleeding. The risks and benefits to chronic Coumadin anticoagulation will need  to be reviewed by the physician prescribing Coumadin and Hematology however considering his history of thrombosis Coumadin anticoagulation may be the best option for now. Schedule hematology follow-up for further advice and management of his thrombosis history. Consider Nadolol to reduce portal pressures after EGD. Schedule EGD to further assess known varices and to further evaluate iron deficiency. The risks (including bleeding, perforation, infection, missed lesions, medication reactions and possible hospitalization or surgery if complications occur), benefits, and alternatives to endoscopy with possible biopsy and possible dilation were discussed with the patient and they consent to proceed.   3. Iron deficiency anemia. Iron twice daily taken after meals. Source of bleeding is likely from known colonic AVMs. Rule out portal gastropathy and other upper gastrointestinal tract sources of chronic blood loss. Schedule EGD as an #2.  4. History of ITP followed by Dr. Beryle Beams. Hypersplenism may be contributing to or causing his thrombocytopenia and leukopenia. Schedule Hematology follow up also for #2 and #3.   5. Cholelithiasis, asymptomatic.  6. Colonic AVMs.  7. Chronic anticoagulation on Coumadin. See problem #2. Hold Coumadin 5 days before procedure - will instruct when and how to resume after procedure. Low but real risk of cardiovascular event such as heart attack, stroke, embolism, thrombosis or ischemia/infarct of other organs off Coumadin explained and need to seek urgent help if this occurs. The patient consents to proceed. Will communicate by phone or EMR with patient's prescribing provider to confirm that holding Coumadin is reasonable in this case.

## 2015-07-26 NOTE — Patient Instructions (Addendum)
The cancer center will contact you with an appointment date and time for your referral to Hematology.   You have been scheduled for an endoscopy. Please follow written instructions given to you at your visit today. If you use inhalers (even only as needed), please bring them with you on the day of your procedure. Your physician has requested that you go to www.startemmi.com and enter the access code given to you at your visit today. This web site gives a general overview about your procedure. However, you should still follow specific instructions given to you by our office regarding your preparation for the procedure.  Your next Twinrix injection is on 08/02/15 at 10:00am.   Continue Iron supplement twice daily.   You need to call back in one month to schedule your 3 month follow up visit with Dr. Fuller Plan.   Thank you for choosing me and [facility].  Pricilla Riffle. Dagoberto Ligas., MD., Marval Regal

## 2015-07-26 NOTE — Telephone Encounter (Signed)
  07/26/2015   RE: ARK AGRUSA DOB: 03-Oct-1940 MRN: [id number]   Dear Dr. Maudie Mercury,    We have scheduled the above patient for an endoscopic procedure. Our records show that he is on anticoagulation therapy.   Please advise as to how long the patient may come off his therapy of coumadin prior to the procedure, which is scheduled for 08/14/15.  Please fax back your answer to [place]  at [phone].   Sincerely,    Marlon Pel, CMA Lucio Edward, MD

## 2015-08-01 ENCOUNTER — Encounter: Payer: Self-pay | Admitting: Gastroenterology

## 2015-08-02 ENCOUNTER — Ambulatory Visit (INDEPENDENT_AMBULATORY_CARE_PROVIDER_SITE_OTHER): Payer: Medicare Other | Admitting: Gastroenterology

## 2015-08-02 DIAGNOSIS — Z23 Encounter for immunization: Secondary | ICD-10-CM

## 2015-08-03 NOTE — Telephone Encounter (Signed)
Spoke with wife Legrand Rams as he was unavailable.  She wrote down for Sameul to hold his coumadin after his June 20th dose. She verbalized understanding.  Will stamp and send the letter down to be scanned into epic that Dr Jani Gravel sent back.

## 2015-08-08 DIAGNOSIS — Z86718 Personal history of other venous thrombosis and embolism: Secondary | ICD-10-CM | POA: Diagnosis not present

## 2015-08-08 DIAGNOSIS — Z7901 Long term (current) use of anticoagulants: Secondary | ICD-10-CM | POA: Diagnosis not present

## 2015-08-14 ENCOUNTER — Ambulatory Visit (AMBULATORY_SURGERY_CENTER): Payer: Medicare Other | Admitting: Gastroenterology

## 2015-08-14 ENCOUNTER — Encounter: Payer: Self-pay | Admitting: Gastroenterology

## 2015-08-14 VITALS — BP 130/72 | HR 62 | Temp 97.5°F | Resp 17 | Ht 69.5 in | Wt 169.0 lb

## 2015-08-14 DIAGNOSIS — D509 Iron deficiency anemia, unspecified: Secondary | ICD-10-CM | POA: Diagnosis not present

## 2015-08-14 DIAGNOSIS — I8500 Esophageal varices without bleeding: Secondary | ICD-10-CM

## 2015-08-14 LAB — GLUCOSE, CAPILLARY
GLUCOSE-CAPILLARY: 104 mg/dL — AB (ref 65–99)
GLUCOSE-CAPILLARY: 109 mg/dL — AB (ref 65–99)

## 2015-08-14 MED ORDER — SODIUM CHLORIDE 0.9 % IV SOLN: 500.0000 mL | INTRAVENOUS | Status: DC

## 2015-08-14 NOTE — Op Note (Addendum)
[place] Patient Name: [name] Procedure Date: 08/14/2015 9:47 AM MRN: [id number] Endoscopist: Ladene Artist , MD Age: 75 Referring MD:  Date of Birth: [birth year] Gender: Male Account #: [id number] Procedure:                Upper GI endoscopy Indications:              Iron deficiency anemia, Cirrhosis with suspected                            esophageal varices Medicines:                Monitored Anesthesia Care Procedure:                Pre-Anesthesia Assessment:                           - Prior to the procedure, a History and Physical                            was performed, and patient medications and                            allergies were reviewed. The patient's tolerance of                            previous anesthesia was also reviewed. The risks                            and benefits of the procedure and the sedation                            options and risks were discussed with the patient.                            All questions were answered, and informed consent                            was obtained. Prior Anticoagulants: The patient has                            taken no previous anticoagulant or antiplatelet                            agents. ASA Grade Assessment: III - A patient with                            severe systemic disease. After reviewing the risks                            and benefits, the patient was deemed in                            satisfactory condition to undergo the procedure.  After obtaining informed consent, the endoscope was                            passed under direct vision. Throughout the                            procedure, the patient's blood pressure, pulse, and                            oxygen saturations were monitored continuously. The                            Model GIF-HQ190 [id number]) scope was introduced                            through the mouth, and advanced to  the second part                            of duodenum. The upper GI endoscopy was                            accomplished without difficulty. The patient                            tolerated the procedure well. Scope In: Scope Out: Findings:                 Two columns of non-bleeding grade I varices were                            found in the lower third of the esophagus. No                            stigmata of recent bleeding were evident and no red                            wale signs were present.                           The exam of the esophagus was otherwise normal.                           Mild portal hypertensive gastropathy was found in                            the gastric body and fundus.                           The exam of the stomach was otherwise normal.                           The duodenal bulb and second portion of the  duodenum were normal.                           The cardia and gastric fundus were normal on                            retroflexion. No gastric varices. Complications:            No immediate complications. Estimated Blood Loss:     Estimated blood loss: none. Impression:               - Non-bleeding grade I esophageal varices.                           - Portal hypertensive gastropathy.                           - Normal duodenal bulb and second portion of the                            duodenum.                           - No specimens collected. Recommendation:           - Patient has a contact number available for                            emergencies. The signs and symptoms of potential                            delayed complications were discussed with the                            patient. Return to normal activities tomorrow.                            Written discharge instructions were provided to the                            patient.                           - Resume previous diet.                            - Continue present medications.                           - Repeat upper endoscopy in 1 year for surveillance. Ladene Artist, MD 08/14/2015 10:07:13 AM This report has been signed electronically. Addendum Number: 1   Addendum Date: 08/14/2015 10:17:17 AM      Pt stopped Coumadin 5 days prior to procedure.      Resume Coumadin today and further plans per managing MD. Ladene Artist, MD 08/14/2015 10:18:10 AM This report has been signed electronically.

## 2015-08-14 NOTE — Progress Notes (Signed)
A/ox3, pleased with MAC, report to RN 

## 2015-08-14 NOTE — Patient Instructions (Signed)
YOU HAD AN ENDOSCOPIC PROCEDURE TODAY AT [facility]:   Refer to the procedure report that was given to you for any specific questions about what was found during the examination.  If the procedure report does not answer your questions, please call your gastroenterologist to clarify.  If you requested that your care partner not be given the details of your procedure findings, then the procedure report has been included in a sealed envelope for you to review at your convenience later.  YOU SHOULD EXPECT: Some feelings of bloating in the abdomen. Passage of more gas than usual.  Walking can help get rid of the air that was put into your GI tract during the procedure and reduce the bloating. If you had a lower endoscopy (such as a colonoscopy or flexible sigmoidoscopy) you may notice spotting of blood in your stool or on the toilet paper. If you underwent a bowel prep for your procedure, you may not have a normal bowel movement for a few days.  Please Note:  You might notice some irritation and congestion in your nose or some drainage.  This is from the oxygen used during your procedure.  There is no need for concern and it should clear up in a day or so.  SYMPTOMS TO REPORT IMMEDIATELY:    Following upper endoscopy (EGD)  Vomiting of blood or coffee ground material  New chest pain or pain under the shoulder blades  Painful or persistently difficult swallowing  New shortness of breath  Fever of 100F or higher  Black, tarry-looking stools  For urgent or emergent issues, a gastroenterologist can be reached at any hour by calling [phone].   DIET: Your first meal following the procedure should be a small meal and then it is ok to progress to your normal diet. Heavy or fried foods are harder to digest and may make you feel nauseous or bloated.  Likewise, meals heavy in dairy and vegetables can increase bloating.  Drink plenty of fluids but you should avoid alcoholic beverages  for 24 hours.  ACTIVITY:  You should plan to take it easy for the rest of today and you should NOT DRIVE or use heavy machinery until tomorrow (because of the sedation medicines used during the test).    FOLLOW UP: Our staff will call the number listed on your records the next business day following your procedure to check on you and address any questions or concerns that you may have regarding the information given to you following your procedure. If we do not reach you, we will leave a message.  However, if you are feeling well and you are not experiencing any problems, there is no need to return our call.  We will assume that you have returned to your regular daily activities without incident.  If any biopsies were taken you will be contacted by phone or by letter within the next 1-3 weeks.  Please call us at [phone] if you have not heard about the biopsies in 3 weeks.    SIGNATURES/CONFIDENTIALITY: You and/or your care partner have signed paperwork which will be entered into your electronic medical record.  These signatures attest to the fact that that the information above on your After Visit Summary has been reviewed and is understood.  Full responsibility of the confidentiality of this discharge information lies with you and/or your care-partner.  Read all of the handouts given to you by your recovery room nurse.   Resume your coumadin today  per Dr. Fuller Plan.  Thank-you for choosing [place] for your healthcare needs today.

## 2015-08-15 ENCOUNTER — Telehealth: Payer: Self-pay

## 2015-08-15 NOTE — Telephone Encounter (Signed)
  Follow up Call-  Call back number 08/14/2015  Post procedure Call Back phone  # [phone]  Permission to leave phone message Yes     Patient questions:  Do you have a fever, pain , or abdominal swelling? No. Pain Score  0 *  Have you tolerated food without any problems? Yes.    Have you been able to return to your normal activities? Yes.    Do you have any questions about your discharge instructions: Diet   No. Medications  No. Follow up visit  No.  Do you have questions or concerns about your Care? No.  Actions: * If pain score is 4 or above: No action needed, pain <4.

## 2015-08-18 ENCOUNTER — Ambulatory Visit (INDEPENDENT_AMBULATORY_CARE_PROVIDER_SITE_OTHER): Payer: Medicare Other | Admitting: Gastroenterology

## 2015-08-18 DIAGNOSIS — Z23 Encounter for immunization: Secondary | ICD-10-CM

## 2015-08-24 DIAGNOSIS — E119 Type 2 diabetes mellitus without complications: Secondary | ICD-10-CM | POA: Diagnosis not present

## 2015-08-24 DIAGNOSIS — I10 Essential (primary) hypertension: Secondary | ICD-10-CM | POA: Diagnosis not present

## 2015-08-24 DIAGNOSIS — Z7901 Long term (current) use of anticoagulants: Secondary | ICD-10-CM | POA: Diagnosis not present

## 2015-08-24 DIAGNOSIS — Z86718 Personal history of other venous thrombosis and embolism: Secondary | ICD-10-CM | POA: Diagnosis not present

## 2015-08-31 DIAGNOSIS — E119 Type 2 diabetes mellitus without complications: Secondary | ICD-10-CM | POA: Diagnosis not present

## 2015-08-31 DIAGNOSIS — E7800 Pure hypercholesterolemia, unspecified: Secondary | ICD-10-CM | POA: Diagnosis not present

## 2015-08-31 DIAGNOSIS — Z0000 Encounter for general adult medical examination without abnormal findings: Secondary | ICD-10-CM | POA: Diagnosis not present

## 2015-08-31 DIAGNOSIS — I10 Essential (primary) hypertension: Secondary | ICD-10-CM | POA: Diagnosis not present

## 2015-09-25 ENCOUNTER — Encounter (HOSPITAL_COMMUNITY): Payer: Self-pay | Admitting: Oncology

## 2015-09-25 ENCOUNTER — Emergency Department (HOSPITAL_BASED_OUTPATIENT_CLINIC_OR_DEPARTMENT_OTHER)
Admit: 2015-09-25 | Discharge: 2015-09-25 | Disposition: A | Discharge status: Home / Self Care | Payer: Medicare Other | Attending: Emergency Medicine | Admitting: Emergency Medicine

## 2015-09-25 ENCOUNTER — Emergency Department (HOSPITAL_COMMUNITY)
Admission: EM | Admit: 2015-09-25 | Discharge: 2015-09-25 | Disposition: A | Discharge status: Home / Self Care | Payer: Medicare Other | Source: Home / Self Care | Attending: Emergency Medicine | Admitting: Emergency Medicine

## 2015-09-25 ENCOUNTER — Inpatient Hospital Stay (HOSPITAL_COMMUNITY): Admit: 2015-09-25 | Payer: Medicare Other

## 2015-09-25 DIAGNOSIS — R000 Tachycardia, unspecified: Secondary | ICD-10-CM | POA: Diagnosis not present

## 2015-09-25 DIAGNOSIS — E119 Type 2 diabetes mellitus without complications: Secondary | ICD-10-CM | POA: Insufficient documentation

## 2015-09-25 DIAGNOSIS — Z8249 Family history of ischemic heart disease and other diseases of the circulatory system: Secondary | ICD-10-CM | POA: Diagnosis not present

## 2015-09-25 DIAGNOSIS — Z7984 Long term (current) use of oral hypoglycemic drugs: Secondary | ICD-10-CM | POA: Insufficient documentation

## 2015-09-25 DIAGNOSIS — M79662 Pain in left lower leg: Secondary | ICD-10-CM | POA: Diagnosis not present

## 2015-09-25 DIAGNOSIS — I10 Essential (primary) hypertension: Secondary | ICD-10-CM | POA: Insufficient documentation

## 2015-09-25 DIAGNOSIS — Z79899 Other long term (current) drug therapy: Secondary | ICD-10-CM

## 2015-09-25 DIAGNOSIS — Z800 Family history of malignant neoplasm of digestive organs: Secondary | ICD-10-CM | POA: Diagnosis not present

## 2015-09-25 DIAGNOSIS — K7460 Unspecified cirrhosis of liver: Secondary | ICD-10-CM | POA: Diagnosis not present

## 2015-09-25 DIAGNOSIS — L03116 Cellulitis of left lower limb: Secondary | ICD-10-CM | POA: Insufficient documentation

## 2015-09-25 DIAGNOSIS — Z7901 Long term (current) use of anticoagulants: Secondary | ICD-10-CM

## 2015-09-25 DIAGNOSIS — D649 Anemia, unspecified: Secondary | ICD-10-CM | POA: Diagnosis not present

## 2015-09-25 DIAGNOSIS — Z833 Family history of diabetes mellitus: Secondary | ICD-10-CM | POA: Diagnosis not present

## 2015-09-25 DIAGNOSIS — Z820 Family history of epilepsy and other diseases of the nervous system: Secondary | ICD-10-CM | POA: Diagnosis not present

## 2015-09-25 DIAGNOSIS — Z8673 Personal history of transient ischemic attack (TIA), and cerebral infarction without residual deficits: Secondary | ICD-10-CM

## 2015-09-25 DIAGNOSIS — Z86010 Personal history of colonic polyps: Secondary | ICD-10-CM | POA: Diagnosis not present

## 2015-09-25 DIAGNOSIS — D693 Immune thrombocytopenic purpura: Secondary | ICD-10-CM | POA: Diagnosis not present

## 2015-09-25 DIAGNOSIS — E785 Hyperlipidemia, unspecified: Secondary | ICD-10-CM | POA: Diagnosis not present

## 2015-09-25 DIAGNOSIS — Z86718 Personal history of other venous thrombosis and embolism: Secondary | ICD-10-CM | POA: Diagnosis not present

## 2015-09-25 LAB — CBC WITH DIFFERENTIAL/PLATELET
BASOS ABS: 0 10*3/uL (ref 0.0–0.1)
BASOS PCT: 0 %
EOS ABS: 0.1 10*3/uL (ref 0.0–0.7)
EOS PCT: 2 %
HCT: 29.3 % — ABNORMAL LOW (ref 39.0–52.0)
Hemoglobin: 9.9 g/dL — ABNORMAL LOW (ref 13.0–17.0)
LYMPHS ABS: 0.7 10*3/uL (ref 0.7–4.0)
LYMPHS PCT: 17 %
MCH: 31.7 pg (ref 26.0–34.0)
MCHC: 33.8 g/dL (ref 30.0–36.0)
MCV: 93.9 fL (ref 78.0–100.0)
MONO ABS: 0.4 10*3/uL (ref 0.1–1.0)
Monocytes Relative: 10 %
NEUTROS ABS: 3 10*3/uL (ref 1.7–7.7)
NEUTROS PCT: 72 %
PLATELETS: 49 10*3/uL — AB (ref 150–400)
RBC: 3.12 MIL/uL — AB (ref 4.22–5.81)
RDW: 15.6 % — AB (ref 11.5–15.5)
WBC: 4.3 10*3/uL (ref 4.0–10.5)

## 2015-09-25 LAB — PROTIME-INR
INR: 2.9
PROTHROMBIN TIME: 31 s — AB (ref 11.4–15.2)

## 2015-09-25 LAB — BASIC METABOLIC PANEL
ANION GAP: 7 (ref 5–15)
BUN: 17 mg/dL (ref 6–20)
CALCIUM: 8.6 mg/dL — AB (ref 8.9–10.3)
CO2: 23 mmol/L (ref 22–32)
Chloride: 109 mmol/L (ref 101–111)
Creatinine, Ser: 0.98 mg/dL (ref 0.61–1.24)
GFR calc Af Amer: >60 mL/min (ref >60)
GLUCOSE: 183 mg/dL — AB (ref 65–99)
POTASSIUM: 4.4 mmol/L (ref 3.5–5.1)
SODIUM: 139 mmol/L (ref 135–145)

## 2015-09-25 MED ORDER — CEFAZOLIN IN D5W 1 GM/50ML IV SOLN
1.0000 g | Freq: Once | INTRAVENOUS | Status: AC
  Administered 2015-09-25: 1 g via INTRAVENOUS
  Filled 2015-09-25: qty 50

## 2015-09-25 MED ORDER — CEPHALEXIN 500 MG PO CAPS: 500.0000 mg | ORAL_CAPSULE | Freq: Four times a day (QID) | ORAL | 0 refills | Status: DC

## 2015-09-25 NOTE — Discharge Instructions (Signed)
It was our pleasure to provide your ER care today - we hope that you feel better.  Elevate leg.   Keep skin very clean/dry.  Wash leg with antibacterial soap and water 2x/day.   Take antibiotic as prescribed.    Follow up with primary care doctor in 2 days time for recheck/recheck of leg.   Return to ER right away if worse, new symptoms, spreading redness beyond markings, severe leg pain, high fevers, weak/fainting, vomiting, other concern.

## 2015-09-25 NOTE — ED Provider Notes (Signed)
[facility] DEPT Provider Note   CSN: [id number] Arrival date & time: 09/25/15  3154  First Provider Contact:  First MD Initiated Contact with Patient 09/25/15 0719        History   Chief Complaint Chief Complaint  Patient presents with  . Cellulitis    HPI [name] is a 75 y.o. male.  Patient c/o left lower leg and ankle area redness, and mild pain for the past few days. Redness constant, persistent, slowly worse/spreading. No specific exacerbating or alleviating factors. Spouse had noted dried/flaky skin on ankle area. Hx same in right leg, also had dvt there. On coumadin. Compliant w normal meds. No fevers. No nv. Does not feel ill or sick. No nv. Normal appetite.    The history is provided by the patient and the spouse.    Past Medical History:  Diagnosis Date  . Anemia   . Arthritis    FINGERS  . Blood transfusion without reported diagnosis   . Carotid artery occlusion   . Cataract    BILATERAL-REMOVED  . Cellulitis and abscess of leg 03/19/2013  . Cirrhosis ([place])   . Clotting disorder ([place])   . Colon polyps   . CVA (cerebral vascular accident) ([place])    TIA history  . Diabetes mellitus 66  . DVT (deep venous thrombosis) ([place]) 03/20/2013  . Esophageal varices ([place])   . Essential hypertension, benign 03/20/2013  . Hyperlipidemia   . 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 Leukopenia 08/11/2012  . Mesenteric venous thrombosis 1996  . Mitral regurgitation   . Splenomegaly     Patient Active Problem List   Diagnosis Date Noted  . Secondary esophageal varices without bleeding ([place]) 07/05/2015  . Taste impairment 10/27/2014  . Carotid stenosis 12/01/2013  . History of stroke 03/20/2013  . DVT (deep venous thrombosis) ([place]) 03/20/2013  . Other and unspecified hyperlipidemia 03/20/2013  . Essential hypertension, benign 03/20/2013  . Cellulitis of leg, right 03/19/2013  . Anemia 03/19/2013  . Diabetes mellitus ([place]) 03/19/2013  . ITP  (idiopathic thrombocytopenic purpura) 08/11/2012  . Leukopenia 08/11/2012  . Occlusion and stenosis of carotid artery without mention of cerebral infarction 10/30/2011    Past Surgical History:  Procedure Laterality Date  . CATARACT EXTRACTION Right Jun 26, 2012   Cataract  . CATARACT EXTRACTION Left July 27, 2012   Cataract  . COLONOSCOPY         Home Medications    Prior to Admission medications   Medication Sig Start Date End Date Taking? Authorizing Provider  atorvastatin (LIPITOR) 40 MG tablet Take 40 mg by mouth every evening.    Yes Historical Provider, MD  ferrous sulfate 325 (65 FE) MG EC tablet Take 1 tablet (325 mg total) by mouth 2 (two) times daily with a meal. 07/07/15  Yes Ladene Artist, MD  glipiZIDE (GLUCOTROL) 10 MG tablet Take 10 mg by mouth 2 (two) times daily before a meal.     Yes Historical Provider, MD  losartan (COZAAR) 50 MG tablet Take 50 mg by mouth daily.  08/07/15  Yes Historical Provider, MD  metFORMIN (GLUCOPHAGE) 1000 MG tablet Take 1,000 mg by mouth 2 (two) times daily. 09/06/15  Yes Historical Provider, MD  metFORMIN (GLUCOPHAGE) 500 MG tablet Take 500 mg by mouth 2 (two) times daily with a meal.  05/20/13  Yes Historical Provider, MD  saxagliptin HCl (ONGLYZA) 5 MG TABS tablet Take 5 mg by mouth every evening.    Yes Historical Provider, MD  warfarin (COUMADIN) 2 MG tablet Take 2 mg by mouth 2 (two) times daily.    Yes Historical Provider, MD    Family History Family History  Problem Relation Age of Onset  . Alzheimer's disease Father   . Dementia Father   . Cancer Sister     unknown type  . Diabetes Sister   . Deep vein thrombosis Sister     Varicose vein  . Heart disease Mother     After age 51  . Hypertension Mother   . Pancreatic cancer Son     Social History Social History  Substance Use Topics  . Smoking status: Never Smoker  . Smokeless tobacco: Never Used  . Alcohol use No     Allergies   Review of patient's allergies  indicates no known allergies.   Review of Systems Review of Systems  Constitutional: Negative for fever.  HENT: Negative for sore throat.   Eyes: Negative for redness.  Respiratory: Negative for shortness of breath.   Cardiovascular: Positive for leg swelling. Negative for chest pain.       Mild left lower leg swelling.   Gastrointestinal: Negative for abdominal pain.  Genitourinary: Negative for flank pain.  Musculoskeletal: Negative for back pain and neck pain.  Skin: Negative for rash.  Neurological: Negative for numbness and headaches.  Hematological: Does not bruise/bleed easily.  Psychiatric/Behavioral: Negative for confusion.     Physical Exam Updated Vital Signs BP 179/68 (BP Location: Left Arm)   Pulse 74   Temp 98.2 F (36.8 C) (Oral)   Resp 15   Ht 5' 10"  (1.778 m)   Wt 75.8 kg   SpO2 100%   BMI 23.96 kg/m   Physical Exam  Constitutional: He is oriented to person, place, and time. He appears well-developed and well-nourished. No distress.  HENT:  Head: Atraumatic.  Eyes: Pupils are equal, round, and reactive to light.  Neck: Neck supple. No tracheal deviation present.  Cardiovascular: Normal rate.   Pulmonary/Chest: Effort normal. No accessory muscle usage. No respiratory distress.  Abdominal: He exhibits no distension.  Musculoskeletal: Normal range of motion.  Cellulitis left lower leg. Dp/pt 2+. Mild swelling to leg.   Neurological: He is alert and oriented to person, place, and time.  Skin: Skin is warm and dry.  Psychiatric: He has a normal mood and affect.  Nursing note and vitals reviewed.    ED Treatments / Results  Labs (all labs ordered are listed, but only abnormal results are displayed)   Results for orders placed or performed during the hospital encounter of 09/25/15  CBC with Differential/Platelet  Result Value Ref Range   WBC 4.3 4.0 - 10.5 K/uL   RBC 3.12 (L) 4.22 - 5.81 MIL/uL   Hemoglobin 9.9 (L) 13.0 - 17.0 g/dL   HCT 29.3  (L) 39.0 - 52.0 %   MCV 93.9 78.0 - 100.0 fL   MCH 31.7 26.0 - 34.0 pg   MCHC 33.8 30.0 - 36.0 g/dL   RDW 15.6 (H) 11.5 - 15.5 %   Platelets 49 (L) 150 - 400 K/uL   Neutrophils Relative % 72 %   Neutro Abs 3.0 1.7 - 7.7 K/uL   Lymphocytes Relative 17 %   Lymphs Abs 0.7 0.7 - 4.0 K/uL   Monocytes Relative 10 %   Monocytes Absolute 0.4 0.1 - 1.0 K/uL   Eosinophils Relative 2 %   Eosinophils Absolute 0.1 0.0 - 0.7 K/uL   Basophils Relative 0 %   Basophils Absolute  0.0 0.0 - 0.1 K/uL  Basic metabolic panel  Result Value Ref Range   Sodium 139 135 - 145 mmol/L   Potassium 4.4 3.5 - 5.1 mmol/L   Chloride 109 101 - 111 mmol/L   CO2 23 22 - 32 mmol/L   Glucose, Bld 183 (H) 65 - 99 mg/dL   BUN 17 6 - 20 mg/dL   Creatinine, Ser 0.98 0.61 - 1.24 mg/dL   Calcium 8.6 (L) 8.9 - 10.3 mg/dL   GFR calc non Af Amer >60 >60 mL/min   GFR calc Af Amer >60 >60 mL/min   Anion gap 7 5 - 15  Protime-INR  Result Value Ref Range   Prothrombin Time 31.0 (H) 11.4 - 15.2 seconds   INR 2.90     EKG  EKG Interpretation None       Radiology No results found.  Procedures Procedures (including critical care time)  Medications Ordered in ED Medications  ceFAZolin (ANCEF) IVPB 1 g/50 mL premix (1 g Intravenous New Bag/Given 09/25/15 0814)     Initial Impression / Assessment and Plan / ED Course  I have reviewed the triage vital signs and the nursing notes.  Pertinent labs & imaging results that were available during my care of the patient were reviewed by me and considered in my medical decision making (see chart for details).  Clinical Course   Iv ns. Ancef iv.  Labs.  Vascular tech indicates dvt study is negative for dvt.   Patient alert, content, nad.  Patient w no fevers, no nv, does not feel sick/ill.  Patient currently appears stable for outpatient tx.   Final Clinical Impressions(s) / ED Diagnoses   Cellulitis.   New Prescriptions Keflex.   Lajean Saver, MD 09/25/15  [phone]

## 2015-09-25 NOTE — ED Triage Notes (Signed)
Pt presents d/t redness, warmth and swelling of LLE.  Per pt he has a hx DVT and cellulitis of the RLE and what he is experiencing now feels the same.  Pt rates pain 0/10, states pain intensifies w/ ambulation as well as touch.

## 2015-09-25 NOTE — Progress Notes (Signed)
*  Preliminary Results* Left lower extremity venous duplex completed. Left lower extremity is negative for deep vein thrombosis. There is no evidence of left Baker's cyst.  09/25/2015 9:40 AM  Maudry Mayhew, B.S., RVT, RDCS, RDMS

## 2015-09-27 ENCOUNTER — Encounter (HOSPITAL_COMMUNITY): Payer: Self-pay | Admitting: General Practice

## 2015-09-27 ENCOUNTER — Inpatient Hospital Stay (HOSPITAL_COMMUNITY)
Admission: AD | Admit: 2015-09-27 | Discharge: 2015-10-02 | DRG: 603 | Disposition: A | Discharge status: Home Health | Payer: Medicare Other | Source: Ambulatory Visit | Attending: Internal Medicine | Admitting: Internal Medicine

## 2015-09-27 DIAGNOSIS — Z800 Family history of malignant neoplasm of digestive organs: Secondary | ICD-10-CM | POA: Diagnosis not present

## 2015-09-27 DIAGNOSIS — L0390 Cellulitis, unspecified: Secondary | ICD-10-CM | POA: Diagnosis present

## 2015-09-27 DIAGNOSIS — Z86010 Personal history of colonic polyps: Secondary | ICD-10-CM | POA: Diagnosis not present

## 2015-09-27 DIAGNOSIS — Z8249 Family history of ischemic heart disease and other diseases of the circulatory system: Secondary | ICD-10-CM

## 2015-09-27 DIAGNOSIS — D649 Anemia, unspecified: Secondary | ICD-10-CM | POA: Diagnosis present

## 2015-09-27 DIAGNOSIS — Z7901 Long term (current) use of anticoagulants: Secondary | ICD-10-CM | POA: Diagnosis not present

## 2015-09-27 DIAGNOSIS — I10 Essential (primary) hypertension: Secondary | ICD-10-CM | POA: Diagnosis present

## 2015-09-27 DIAGNOSIS — Z8673 Personal history of transient ischemic attack (TIA), and cerebral infarction without residual deficits: Secondary | ICD-10-CM

## 2015-09-27 DIAGNOSIS — E785 Hyperlipidemia, unspecified: Secondary | ICD-10-CM | POA: Diagnosis present

## 2015-09-27 DIAGNOSIS — K7460 Unspecified cirrhosis of liver: Secondary | ICD-10-CM | POA: Diagnosis present

## 2015-09-27 DIAGNOSIS — Z7984 Long term (current) use of oral hypoglycemic drugs: Secondary | ICD-10-CM | POA: Diagnosis not present

## 2015-09-27 DIAGNOSIS — L03116 Cellulitis of left lower limb: Principal | ICD-10-CM | POA: Diagnosis present

## 2015-09-27 DIAGNOSIS — D693 Immune thrombocytopenic purpura: Secondary | ICD-10-CM | POA: Diagnosis present

## 2015-09-27 DIAGNOSIS — Z86718 Personal history of other venous thrombosis and embolism: Secondary | ICD-10-CM

## 2015-09-27 DIAGNOSIS — R000 Tachycardia, unspecified: Secondary | ICD-10-CM | POA: Diagnosis present

## 2015-09-27 DIAGNOSIS — Z833 Family history of diabetes mellitus: Secondary | ICD-10-CM | POA: Diagnosis not present

## 2015-09-27 DIAGNOSIS — M79605 Pain in left leg: Secondary | ICD-10-CM

## 2015-09-27 DIAGNOSIS — D696 Thrombocytopenia, unspecified: Secondary | ICD-10-CM | POA: Diagnosis not present

## 2015-09-27 DIAGNOSIS — E119 Type 2 diabetes mellitus without complications: Secondary | ICD-10-CM | POA: Diagnosis present

## 2015-09-27 DIAGNOSIS — Z820 Family history of epilepsy and other diseases of the nervous system: Secondary | ICD-10-CM

## 2015-09-27 HISTORY — DX: Type 2 diabetes mellitus without complications: E11.9

## 2015-09-27 HISTORY — DX: Unspecified cirrhosis of liver: K74.60

## 2015-09-27 HISTORY — DX: Personal history of other medical treatment: Z92.89

## 2015-09-27 LAB — COMPREHENSIVE METABOLIC PANEL
ALBUMIN: 2.6 g/dL — AB (ref 3.5–5.0)
ALT: 19 U/L (ref 17–63)
ANION GAP: 7 (ref 5–15)
AST: 24 U/L (ref 15–41)
Alkaline Phosphatase: 49 U/L (ref 38–126)
BUN: 18 mg/dL (ref 6–20)
CHLORIDE: 109 mmol/L (ref 101–111)
CO2: 22 mmol/L (ref 22–32)
Calcium: 8.4 mg/dL — ABNORMAL LOW (ref 8.9–10.3)
Creatinine, Ser: 1.12 mg/dL (ref 0.61–1.24)
GFR calc Af Amer: >60 mL/min (ref >60)
GFR calc non Af Amer: >60 mL/min (ref >60)
GLUCOSE: 122 mg/dL — AB (ref 65–99)
POTASSIUM: 4.3 mmol/L (ref 3.5–5.1)
SODIUM: 138 mmol/L (ref 135–145)
Total Bilirubin: 0.8 mg/dL (ref 0.3–1.2)
Total Protein: 5.7 g/dL — ABNORMAL LOW (ref 6.5–8.1)

## 2015-09-27 LAB — PROTIME-INR
INR: 2.72
PROTHROMBIN TIME: 29.4 s — AB (ref 11.4–15.2)

## 2015-09-27 LAB — CBC
HCT: 27.3 % — ABNORMAL LOW (ref 39.0–52.0)
Hemoglobin: 8.9 g/dL — ABNORMAL LOW (ref 13.0–17.0)
MCH: 30.5 pg (ref 26.0–34.0)
MCHC: 32.6 g/dL (ref 30.0–36.0)
MCV: 93.5 fL (ref 78.0–100.0)
PLATELETS: 48 10*3/uL — AB (ref 150–400)
RBC: 2.92 MIL/uL — ABNORMAL LOW (ref 4.22–5.81)
RDW: 15.1 % (ref 11.5–15.5)
WBC: 4.2 10*3/uL (ref 4.0–10.5)

## 2015-09-27 LAB — SEDIMENTATION RATE: SED RATE: 60 mm/h — AB (ref 0–16)

## 2015-09-27 LAB — GLUCOSE, CAPILLARY
Glucose-Capillary: 118 mg/dL — ABNORMAL HIGH (ref 65–99)
Glucose-Capillary: 255 mg/dL — ABNORMAL HIGH (ref 65–99)

## 2015-09-27 MED ORDER — SODIUM CHLORIDE 0.9 % IV SOLN: 250.0000 mL | INTRAVENOUS | Status: DC | PRN

## 2015-09-27 MED ORDER — ENSURE ENLIVE PO LIQD
237.0000 mL | Freq: Two times a day (BID) | ORAL | Status: DC
  Administered 2015-09-28: 237 mL via ORAL

## 2015-09-27 MED ORDER — INSULIN ASPART 100 UNIT/ML ~~LOC~~ SOLN
4.0000 [IU] | Freq: Once | SUBCUTANEOUS | Status: AC
  Administered 2015-09-27: 4 [IU] via SUBCUTANEOUS

## 2015-09-27 MED ORDER — PIPERACILLIN-TAZOBACTAM 3.375 G IVPB
3.3750 g | Freq: Three times a day (TID) | INTRAVENOUS | Status: DC
Start: 2015-09-28 — End: 2015-10-02
  Administered 2015-09-28 – 2015-10-02 (×14): 3.375 g via INTRAVENOUS
  Filled 2015-09-27 (×15): qty 50

## 2015-09-27 MED ORDER — ACETAMINOPHEN 325 MG PO TABS: 650.0000 mg | ORAL_TABLET | Freq: Four times a day (QID) | ORAL | Status: DC | PRN

## 2015-09-27 MED ORDER — VANCOMYCIN HCL 10 G IV SOLR
1500.0000 mg | Freq: Once | INTRAVENOUS | Status: AC
  Administered 2015-09-27: 1500 mg via INTRAVENOUS
  Filled 2015-09-27: qty 1500

## 2015-09-27 MED ORDER — ACETAMINOPHEN 650 MG RE SUPP: 650.0000 mg | Freq: Four times a day (QID) | RECTAL | Status: DC | PRN

## 2015-09-27 MED ORDER — SODIUM CHLORIDE 0.9% FLUSH
3.0000 mL | Freq: Two times a day (BID) | INTRAVENOUS | Status: DC
  Administered 2015-09-27 – 2015-10-01 (×7): 3 mL via INTRAVENOUS

## 2015-09-27 MED ORDER — WARFARIN - PHARMACIST DOSING INPATIENT: Freq: Every day | Status: DC

## 2015-09-27 MED ORDER — SODIUM CHLORIDE 0.9% FLUSH: 3.0000 mL | INTRAVENOUS | Status: DC | PRN

## 2015-09-27 MED ORDER — PIPERACILLIN-TAZOBACTAM 3.375 G IVPB 30 MIN
3.3750 g | Freq: Once | INTRAVENOUS | Status: AC
  Administered 2015-09-27: 3.375 g via INTRAVENOUS
  Filled 2015-09-27: qty 50

## 2015-09-27 MED ORDER — INSULIN ASPART 100 UNIT/ML ~~LOC~~ SOLN: 0.0000 [IU] | Freq: Every day | SUBCUTANEOUS | Status: DC

## 2015-09-27 MED ORDER — INSULIN ASPART 100 UNIT/ML ~~LOC~~ SOLN
0.0000 [IU] | Freq: Three times a day (TID) | SUBCUTANEOUS | Status: DC
  Administered 2015-09-28 – 2015-10-02 (×5): 2 [IU] via SUBCUTANEOUS

## 2015-09-27 MED ORDER — INSULIN ASPART 100 UNIT/ML ~~LOC~~ SOLN: 0.0000 [IU] | Freq: Three times a day (TID) | SUBCUTANEOUS | Status: DC

## 2015-09-27 MED ORDER — VANCOMYCIN HCL 10 G IV SOLR
1250.0000 mg | INTRAVENOUS | Status: DC
  Administered 2015-09-28 – 2015-10-01 (×4): 1250 mg via INTRAVENOUS
  Filled 2015-09-27 (×5): qty 1250

## 2015-09-27 MED ORDER — WARFARIN SODIUM 2 MG PO TABS
2.0000 mg | ORAL_TABLET | ORAL | Status: AC
  Administered 2015-09-28: 2 mg via ORAL
  Filled 2015-09-27: qty 1

## 2015-09-27 NOTE — Progress Notes (Addendum)
Pharmacy Antibiotic Note [name] is a 75 y.o. male admitted on 09/27/2015 with L lower leg cellulitis that failed to respond to outpt PO Keflex.  Pharmacy has been consulted for Zosyn and vancomycin dosing.  SCr 0.98 from 09/25/15  Plan: 1. Zosyn 3.375g IV q8h (4 hour infusion).  2. Vancomycin 1500 mg x 1 now followed by 1250 mg every 24 hours starting on 8/10, 3. Can hopefully scale back broad spectrum abx in next 48 - 72 hours.    Height: 5' 10"  (177.8 cm) Weight: 164 lb 12.8 oz (74.8 kg) IBW/kg (Calculated) : 73  Temp (24hrs), Avg:98.5 F (36.9 C), Min:98.5 F (36.9 C), Max:98.5 F (36.9 C)   Recent Labs Lab 09/25/15 0806  WBC 4.3  CREATININE 0.98    Estimated Creatinine Clearance: 65.2 mL/min (by C-G formula based on SCr of 0.98 mg/dL).    No Known Allergies  Antimicrobials this admission: 8/9 Zosyn >>  8/9 vancomycin >>   Dose adjustments this admission: n/a  Microbiology results: px  Thank you for allowing pharmacy to be a part of this patient's care.  Vincenza Hews, PharmD, BCPS 09/27/2015, 7:15 PM Pager: [phone]

## 2015-09-27 NOTE — H&P (Signed)
Patient Demographics:    [name], is a 75 y.o. male  MRN: [id number]   DOB - [birth year]  Admit Date - 09/27/2015  Outpatient Primary MD for the patient is Jani Gravel, MD  Referring MD/NP/PA:   Outpatient Specialists: oncology  Patient coming from: office  No chief complaint on file.  cellulitis   HPI:    [name]  is a 75 y.o. male, w dm2  Has c/o redness left distal lower ext,  Pt presented to office after having been taking keflex without benefit that was given to him by ED.  Pt will be admitted for cellulitis.     Review of systems:    In addition to the HPI above,  No Fever-chills, No Headache, No changes with Vision or hearing, No problems swallowing food or Liquids, No Chest pain, Cough or Shortness of Breath, No Abdominal pain, No Nausea or Vommitting, Bowel movements are regular, No Blood in stool or Urine, No dysuria, No new joints pains-aches,  No new weakness, tingling, numbness in any extremity, No recent weight gain or loss, No polyuria, polydypsia or polyphagia, No significant Mental Stressors.  A full 10 point Review of Systems was done, except as stated above, all other Review of Systems were negative.   With Past History of the following :    Past Medical History:  Diagnosis Date  . Anemia   . Arthritis    "hands" (09/27/2015)  . Carotid artery occlusion   . Cellulitis and abscess of leg 03/19/2013  . Cirrhosis of liver ([place])   . Clotting disorder ([place])   . Colon polyps   . CVA (cerebral vascular accident) ([place]) ~ 2000   "minor stroke", denies residual on 09/27/2015  . DVT (deep venous thrombosis) ([place]) 03/20/2013  . DVT (deep venous thrombosis) ([place]) 02/2013   RLE  . Esophageal varices ([place])   . Essential hypertension, benign 03/20/2013  . History of blood transfusion 1998   "blood clot began bleeding"  . Hyperlipidemia   . 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 Leukopenia 08/11/2012  . Mesenteric venous  thrombosis 1996  . Mitral regurgitation   . Splenomegaly   . Type II diabetes mellitus ([place]) dx'd in the 1990s      Past Surgical History:  Procedure Laterality Date  . CATARACT EXTRACTION W/ INTRAOCULAR LENS IMPLANT Right 06/26/2012  . CATARACT EXTRACTION W/ INTRAOCULAR LENS IMPLANT Left 07/27/2012  . COLONOSCOPY        Social History:     Social History  Substance Use Topics  . Smoking status: Never Smoker  . Smokeless tobacco: Never Used  . Alcohol use No     Lives - at home  Mobility -      Family History :     Family History  Problem Relation Age of Onset  . Alzheimer's disease Father   . Dementia Father   . Cancer Sister     unknown type  . Diabetes Sister   . Deep vein thrombosis Sister     Varicose vein  . Heart disease Mother     After age 26  . Hypertension Mother   . Pancreatic cancer Son       Home Medications:   Prior to Admission medications   Medication Sig Start Date End Date Taking? Authorizing Provider  atorvastatin (LIPITOR) 40 MG tablet Take 40 mg by mouth every evening.    Yes Historical Provider, MD  cephALEXin (KEFLEX) 500 MG capsule Take 1 capsule (  500 mg total) by mouth 4 (four) times daily. 09/25/15  Yes Lajean Saver, MD  ferrous sulfate 325 (65 FE) MG EC tablet Take 1 tablet (325 mg total) by mouth 2 (two) times daily with a meal. 07/07/15  Yes Ladene Artist, MD  glipiZIDE (GLUCOTROL) 10 MG tablet Take 10 mg by mouth 2 (two) times daily before a meal.     Yes Historical Provider, MD  losartan (COZAAR) 50 MG tablet Take 50 mg by mouth daily.  08/07/15  Yes Historical Provider, MD  metFORMIN (GLUCOPHAGE) 500 MG tablet Take 500 mg by mouth 2 (two) times daily with a meal.  05/20/13  Yes Historical Provider, MD  saxagliptin HCl (ONGLYZA) 5 MG TABS tablet Take 5 mg by mouth every evening.    Yes Historical Provider, MD  warfarin (COUMADIN) 2 MG tablet Take 2 mg by mouth 2 (two) times daily.    Yes Historical Provider, MD     Allergies:     No Known Allergies   Physical Exam:   Vitals  Blood pressure 100/74, pulse 79, temperature 98.4 F (36.9 C), temperature source Oral, resp. rate 18, height 5' 10"  (1.778 m), weight 74.8 kg (164 lb 12.8 oz), SpO2 99 %.   1. General lying in bed in NAD,   2. Normal affect and insight, Not Suicidal or Homicidal, Awake Alert, Oriented X 3.  3. No F.N deficits, ALL C.Nerves Intact, Strength 5/5 all 4 extremities, Sensation intact all 4 extremities, Plantars down going.  4. Ears and Eyes appear Normal, Conjunctivae clear, PERRLA. Moist Oral Mucosa.  5. Supple Neck, No JVD, No cervical lymphadenopathy appriciated, No Carotid Bruits.  6. Symmetrical Chest wall movement, Good air movement bilaterally, CTAB.  7. RRR, No Gallops, Rubs or Murmurs, No Parasternal Heave.  8. Positive Bowel Sounds, Abdomen Soft, No tenderness, No organomegaly appriciated,No rebound -guarding or rigidity.  9.  No Cyanosis, Normal Skin Turgor, Redness from the foot  to the knee left distal lower ext  10. Good muscle tone,  joints appear normal , no effusions, Normal ROM.  11. No Palpable Lymph Nodes in Neck or Axillae    Data Review:    CBC  Recent Labs Lab 09/25/15 0806 09/27/15 1900  WBC 4.3 4.2  HGB 9.9* 8.9*  HCT 29.3* 27.3*  PLT 49* 48*  MCV 93.9 93.5  MCH 31.7 30.5  MCHC 33.8 32.6  RDW 15.6* 15.1  LYMPHSABS 0.7  --   MONOABS 0.4  --   EOSABS 0.1  --   BASOSABS 0.0  --    ------------------------------------------------------------------------------------------------------------------  Chemistries   Recent Labs Lab 09/25/15 0806 09/27/15 1900  NA 139 138  K 4.4 4.3  CL 109 109  CO2 23 22  GLUCOSE 183* 122*  BUN 17 18  CREATININE 0.98 1.12  CALCIUM 8.6* 8.4*  AST  --  24  ALT  --  19  ALKPHOS  --  49  BILITOT  --  0.8   ------------------------------------------------------------------------------------------------------------------ estimated creatinine clearance  is 57 mL/min (by C-G formula based on SCr of 1.12 mg/dL). ------------------------------------------------------------------------------------------------------------------ No results for input(s): TSH, T4TOTAL, T3FREE, THYROIDAB in the last 72 hours.  Invalid input(s): FREET3  Coagulation profile  Recent Labs Lab 09/25/15 0806 09/27/15 2010  INR 2.90 2.72   ------------------------------------------------------------------------------------------------------------------- No results for input(s): DDIMER in the last 72 hours. -------------------------------------------------------------------------------------------------------------------  Cardiac Enzymes No results for input(s): CKMB, TROPONINI, MYOGLOBIN in the last 168 hours.  Invalid input(s): CK ------------------------------------------------------------------------------------------------------------------ No results found for: BNP   ---------------------------------------------------------------------------------------------------------------  Urinalysis No results found for: COLORURINE, APPEARANCEUR, LABSPEC, [place], GLUCOSEU, HGBUR, BILIRUBINUR, KETONESUR, PROTEINUR, UROBILINOGEN, NITRITE, LEUKOCYTESUR  ----------------------------------------------------------------------------------------------------------------   Imaging Results:    No results found.    Assessment & Plan:    Active Problems:   Cellulitis    1. Cellulitis Vanco, zosyn iv  Check cbc, cmp, esr  2. Dm2 fsbs ac and qhs, iss  3. Hypertension Cont current bp medications.     DVT Prophylaxis   Lovenox - SCD  AM Labs Ordered, also please review Full Orders  Family Communication: Admission, patients condition and plan of care including tests being ordered have been discussed with the patient  who indicate understanding and agree with the plan and Code Status.  Code Status FULL CODE  Likely DC to  home  Condition  GUARDED  Consults called:   Admission status: inpatient   Time spent in minutes : 45 minutes  Jani Gravel M.D on 09/27/2015 at 10:32 PM  Between 7am to 7pm - Pager - [phone] . After 7pm go to [email or web address] - password [facility]  [facility] - Office  [phone]

## 2015-09-27 NOTE — Progress Notes (Signed)
ANTICOAGULATION CONSULT NOTE - Initial Consult  Pharmacy Consult for Coumadin Indication: h/o DVT (02/2013) and chronic portal vein thrombosis  No Known Allergies  Patient Measurements: Height: 5' 10"  (177.8 cm) Weight: 164 lb 12.8 oz (74.8 kg) IBW/kg (Calculated) : 73  Vital Signs: Temp: 98.4 F (36.9 C) (08/09 2115) Temp Source: Oral (08/09 2115) BP: 100/74 (08/09 2115) Pulse Rate: 79 (08/09 2115)  Labs:  Recent Labs  09/25/15 0806 09/27/15 1900 09/27/15 2010  HGB 9.9* 8.9*  --   HCT 29.3* 27.3*  --   PLT 49* 48*  --   LABPROT 31.0*  --  29.4*  INR 2.90  --  2.72  CREATININE 0.98 1.12  --     Estimated Creatinine Clearance: 57 mL/min (by C-G formula based on SCr of 1.12 mg/dL).   Medical History: Past Medical History:  Diagnosis Date  . Anemia   . Arthritis    "hands" (09/27/2015)  . Carotid artery occlusion   . Cellulitis and abscess of leg 03/19/2013  . Cirrhosis of liver ([place])   . Clotting disorder ([place])   . Colon polyps   . CVA (cerebral vascular accident) ([place]) ~ 2000   "minor stroke", denies residual on 09/27/2015  . DVT (deep venous thrombosis) ([place]) 03/20/2013  . DVT (deep venous thrombosis) ([place]) 02/2013   RLE  . Esophageal varices ([place])   . Essential hypertension, benign 03/20/2013  . History of blood transfusion 1998   "blood clot began bleeding"  . Hyperlipidemia   . 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 Leukopenia 08/11/2012  . Mesenteric venous thrombosis 1996  . Mitral regurgitation   . Splenomegaly   . Type II diabetes mellitus ([place]) dx'd in the 1990s    Medications:  Prescriptions Prior to Admission  Medication Sig Dispense Refill Last Dose  . atorvastatin (LIPITOR) 40 MG tablet Take 40 mg by mouth every evening.    09/26/2015 at Unknown time  . cephALEXin (KEFLEX) 500 MG capsule Take 1 capsule (500 mg total) by mouth 4 (four) times daily. 28 capsule 0 09/27/2015 at Unknown time  . ferrous sulfate 325 (65 FE) MG EC tablet  Take 1 tablet (325 mg total) by mouth 2 (two) times daily with a meal. 60 tablet 3 09/27/2015 at Unknown time  . glipiZIDE (GLUCOTROL) 10 MG tablet Take 10 mg by mouth 2 (two) times daily before a meal.     09/27/2015 at Unknown time  . losartan (COZAAR) 50 MG tablet Take 50 mg by mouth daily.    09/27/2015 at Unknown time  . metFORMIN (GLUCOPHAGE) 500 MG tablet Take 500 mg by mouth 2 (two) times daily with a meal.    09/27/2015 at Unknown time  . saxagliptin HCl (ONGLYZA) 5 MG TABS tablet Take 5 mg by mouth every evening.    09/26/2015 at Unknown time  . warfarin (COUMADIN) 2 MG tablet Take 2 mg by mouth 2 (two) times daily.    09/27/2015 at 0700    Assessment: 75 y.o. M presents with. Pt on coumadin PTA for h/o DVT (last 02/2013). Admit INR 2.72 (therapeutic). Pt with plt 48 (noted that plt in 40s over past few months). Hgb 8.9. No bleeding noted. Home dose: 71m twice daily (verified twice daily dosing with patient). Last 249mtaken this a.m. ~0700  Goal of Therapy:  INR 2-3 Monitor platelets by anticoagulation protocol: Yes   Plan:  Coumadin 1m1mow Daily INR  CarSherlon HandingharmD, BCPS Clinical pharmacist, pager [phone]/2017,11:18 PM

## 2015-09-28 LAB — GLUCOSE, CAPILLARY
GLUCOSE-CAPILLARY: 102 mg/dL — AB (ref 65–99)
GLUCOSE-CAPILLARY: 91 mg/dL (ref 65–99)
Glucose-Capillary: 151 mg/dL — ABNORMAL HIGH (ref 65–99)
Glucose-Capillary: 93 mg/dL (ref 65–99)
Glucose-Capillary: 142 mg/dL — ABNORMAL HIGH (ref 65–99)

## 2015-09-28 MED ORDER — ATORVASTATIN CALCIUM 40 MG PO TABS
40.0000 mg | ORAL_TABLET | Freq: Every evening | ORAL | Status: DC
  Administered 2015-09-28 – 2015-10-01 (×4): 40 mg via ORAL
  Filled 2015-09-28 (×3): qty 1

## 2015-09-28 MED ORDER — LINAGLIPTIN 5 MG PO TABS
5.0000 mg | ORAL_TABLET | Freq: Every day | ORAL | Status: DC
Start: 2015-09-28 — End: 2015-10-02
  Administered 2015-09-28 – 2015-10-02 (×5): 5 mg via ORAL
  Filled 2015-09-28 (×5): qty 1

## 2015-09-28 MED ORDER — FERROUS SULFATE 325 (65 FE) MG PO TABS
325.0000 mg | ORAL_TABLET | Freq: Two times a day (BID) | ORAL | Status: DC
  Administered 2015-09-28 – 2015-10-02 (×9): 325 mg via ORAL
  Filled 2015-09-28 (×8): qty 1

## 2015-09-28 MED ORDER — LOSARTAN POTASSIUM 50 MG PO TABS
50.0000 mg | ORAL_TABLET | Freq: Every day | ORAL | Status: DC
Start: 2015-09-28 — End: 2015-10-02
  Administered 2015-09-28 – 2015-10-02 (×5): 50 mg via ORAL
  Filled 2015-09-28 (×5): qty 1

## 2015-09-28 MED ORDER — METFORMIN HCL 500 MG PO TABS
500.0000 mg | ORAL_TABLET | Freq: Two times a day (BID) | ORAL | Status: DC
  Administered 2015-09-28 – 2015-09-30 (×5): 500 mg via ORAL
  Filled 2015-09-28 (×5): qty 1

## 2015-09-28 MED ORDER — GLIPIZIDE 5 MG PO TABS
10.0000 mg | ORAL_TABLET | Freq: Two times a day (BID) | ORAL | Status: DC
  Administered 2015-09-28 – 2015-10-02 (×9): 10 mg via ORAL
  Filled 2015-09-28 (×7): qty 2

## 2015-09-28 MED ORDER — GLUCERNA SHAKE PO LIQD
237.0000 mL | Freq: Two times a day (BID) | ORAL | Status: DC
  Administered 2015-09-29 – 2015-10-02 (×5): 237 mL via ORAL

## 2015-09-28 MED ORDER — WARFARIN SODIUM 2 MG PO TABS
2.0000 mg | ORAL_TABLET | Freq: Two times a day (BID) | ORAL | Status: AC
  Administered 2015-09-28 (×2): 2 mg via ORAL
  Filled 2015-09-28 (×3): qty 1

## 2015-09-28 NOTE — Care Management Important Message (Signed)
Important Message  Patient Details  Name: [name] MRN: [id number] Date of Birth: 06-20-1940   Medicare Important Message Given:  Yes    Tatumn Corbridge, Leroy Sea 09/28/2015, 8:21 AM

## 2015-09-28 NOTE — Progress Notes (Addendum)
Patient ID: GEN CLAGG, male   DOB: 1940-06-18, 75 y.o.   MRN: [id number]                                    PROGRESS NOTE                                                                                                                                                                                                             Patient Demographics:    [name], is a 75 y.o. male, DOB - October 23, 1940, WIO:[id number]  Admit date - 09/27/2015   Admitting Physician Jani Gravel, MD  Outpatient Primary MD for the patient is Jani Gravel, MD  LOS - 1  Outpatient Specialists:  No chief complaint on file.    Cellulitis  Brief Narrative  75 yo male with dm2, apparently seen in ED 09/25/2015,  For cellulitis left distallower ext and tx with ancef iv and placed on keflex .  Pt presented on 09/27/2015 with increase in redness to the left distal lower ext and possible hematoma vs small abscess on the middle of the left distal lower ext.  Pt sent from office to hospital for iv abx.     Subjective:    [name] today states afebrile last nite.  ESR high yesterday indicative of infection.  Pt states redness, slightly improved but still extensive over the left foot up to the left knee.  Small rounded area on the mid shin area.   No headache, No chest pain, No abdominal pain - No Nausea, No new weakness tingling or numbness, No Cough - SOB.    Assessment  & Plan :    Active Problems:   Cellulitis  With possible small abscess in the mid distal lower ext Cont vanco, cont zosyn. Iv 8/9=>  Tachycardia from pain of celluiltis Will monitor  Portal vein thrombosis Coumadin pharmacy to dose  Thrombocytopenia Pt is on abx,  Will need close monitoring initially  This is also a reason for hospitalization.   Dm2 Cont current medications.   Anemia Repeat cbc in am   Code Status : FULL CODE  Family Communication  :   Disposition Plan  : home  Barriers For Discharge :   Consults  :    Procedures  :  DVT Prophylaxis  :  Coumadin pharmacy to dose  Lab Results  Component Value Date   PLT 48 (L) 09/27/2015    Antibiotics  :  Anti-infectives    Start     Dose/Rate Route Frequency Ordered Stop   09/28/15 2000  vancomycin (VANCOCIN) 1,250 mg in sodium chloride 0.9 % 250 mL IVPB     1,250 mg 166.7 mL/hr over 90 Minutes Intravenous Every 24 hours 09/27/15 1912     09/28/15 0200  piperacillin-tazobactam (ZOSYN) IVPB 3.375 g     3.375 g 12.5 mL/hr over 240 Minutes Intravenous Every 8 hours 09/27/15 1842     09/27/15 1845  piperacillin-tazobactam (ZOSYN) IVPB 3.375 g     3.375 g 100 mL/hr over 30 Minutes Intravenous  Once 09/27/15 1841 09/27/15 2131   09/27/15 1845  vancomycin (VANCOCIN) 1,500 mg in sodium chloride 0.9 % 500 mL IVPB     1,500 mg 250 mL/hr over 120 Minutes Intravenous  Once 09/27/15 1843 09/27/15 2301        Objective:   Vitals:   09/28/15 0454 09/28/15 0455 09/28/15 1000 09/28/15 1418  BP:  (!) 112/37 (!) 121/40 (!) 98/43  Pulse:  (!) 103 63 72  Resp:  18  20  Temp:  98.4 F (36.9 C)  98.2 F (36.8 C)  TempSrc:  Oral    SpO2:  99%  100%  Weight: 74.7 kg (164 lb 9.6 oz)     Height:        Wt Readings from Last 3 Encounters:  09/28/15 74.7 kg (164 lb 9.6 oz)  09/25/15 75.8 kg (167 lb)  08/14/15 76.7 kg (169 lb)     Intake/Output Summary (Last 24 hours) at 09/28/15 1548 Last data filed at 09/28/15 1400  Gross per 24 hour  Intake             1000 ml  Output              300 ml  Net              700 ml     Physical Exam  Awake Alert, Oriented X 3, No new F.N deficits, Normal affect [place].AT,PERRAL Supple Neck,No JVD, No cervical lymphadenopathy appriciated.  Symmetrical Chest wall movement, Good air movement bilaterally, CTAB RRR,No Gallops,Rubs or new Murmurs, No Parasternal Heave +ve B.Sounds, Abd Soft, No tenderness, No organomegaly appriciated, No rebound - guarding or rigidity. No Cyanosis, Clubbing or edema,    Extensive redness extending from the dorsum of the left foot to the knee. Small area of redness mid shin raised, possible small abscess/ hematoma   Data Review:    CBC  Recent Labs Lab 09/25/15 0806 09/27/15 1900  WBC 4.3 4.2  HGB 9.9* 8.9*  HCT 29.3* 27.3*  PLT 49* 48*  MCV 93.9 93.5  MCH 31.7 30.5  MCHC 33.8 32.6  RDW 15.6* 15.1  LYMPHSABS 0.7  --   MONOABS 0.4  --   EOSABS 0.1  --   BASOSABS 0.0  --     Chemistries   Recent Labs Lab 09/25/15 0806 09/27/15 1900  NA 139 138  K 4.4 4.3  CL 109 109  CO2 23 22  GLUCOSE 183* 122*  BUN 17 18  CREATININE 0.98 1.12  CALCIUM 8.6* 8.4*  AST  --  24  ALT  --  19  ALKPHOS  --  49  BILITOT  --  0.8   ------------------------------------------------------------------------------------------------------------------ No results for input(s): CHOL, HDL, LDLCALC, TRIG, CHOLHDL, LDLDIRECT in the last 72 hours.  Lab Results  Component Value Date   HGBA1C 7.3 (H) 03/20/2013   ------------------------------------------------------------------------------------------------------------------ No results for input(s): TSH, T4TOTAL,  T3FREE, THYROIDAB in the last 72 hours.  Invalid input(s): FREET3 ------------------------------------------------------------------------------------------------------------------ No results for input(s): VITAMINB12, FOLATE, FERRITIN, TIBC, IRON, RETICCTPCT in the last 72 hours.  Coagulation profile  Recent Labs Lab 09/25/15 0806 09/27/15 2010  INR 2.90 2.72    No results for input(s): DDIMER in the last 72 hours.  Cardiac Enzymes No results for input(s): CKMB, TROPONINI, MYOGLOBIN in the last 168 hours.  Invalid input(s): CK ------------------------------------------------------------------------------------------------------------------ No results found for: BNP  Inpatient Medications  Scheduled Meds: . atorvastatin  40 mg Oral QPM  . feeding supplement (ENSURE ENLIVE)  237 mL  Oral BID BM  . ferrous sulfate  325 mg Oral BID WC  . glipiZIDE  10 mg Oral BID AC  . insulin aspart  0-5 Units Subcutaneous QHS  . insulin aspart  0-9 Units Subcutaneous TID WC  . linagliptin  5 mg Oral Daily  . losartan  50 mg Oral Daily  . metFORMIN  500 mg Oral BID WC  . piperacillin-tazobactam (ZOSYN)  IV  3.375 g Intravenous Q8H  . sodium chloride flush  3 mL Intravenous Q12H  . vancomycin  1,250 mg Intravenous Q24H  . warfarin  2 mg Oral BID  . Warfarin - Pharmacist Dosing Inpatient   Does not apply q1800   Continuous Infusions:  PRN Meds:.sodium chloride, acetaminophen **OR** acetaminophen, sodium chloride flush  Micro Results No results found for this or any previous visit (from the past 240 hour(s)).  Radiology Reports No results found.  Time Spent in minutes  30   Jani Gravel M.D on 09/28/2015 at 3:48 PM

## 2015-09-28 NOTE — Progress Notes (Signed)
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  No Known Allergies  Patient Measurements: Height: 5' 10"  (177.8 cm) Weight: 164 lb 9.6 oz (74.7 kg) IBW/kg (Calculated) : 73  Vital Signs: Temp: 98.4 F (36.9 C) (08/10 0455) Temp Source: Oral (08/10 0455) BP: 121/40 (08/10 1000) Pulse Rate: 63 (08/10 1000)  Labs:  Recent Labs  09/27/15 1900 09/27/15 2010  HGB 8.9*  --   HCT 27.3*  --   PLT 48*  --   LABPROT  --  29.4*  INR  --  2.72  CREATININE 1.12  --     Estimated Creatinine Clearance: 57 mL/min (by C-G formula based on SCr of 1.12 mg/dL).  Assessment: 75 y/o male admitted with cellulitis after taking Keflex outpatient without benefit. He takes warfarin PTA for h/o DVT and chronic portal vein thrombosis. INR was therapeutic on admit at 2.72 last night. Daily INR ordered to start tomorrow. No bleeding noted, platelets low but stable (h/o cirrhosis).  Home dose: 32m twice daily (verified twice daily dosing with patient). Last taken 8/9 am.  Goal of Therapy:  INR 2-3 Monitor platelets by anticoagulation protocol: Yes   Plan:  - Warfarin 2 mg PO bid x 2 doses today per home dosing - Daily INR - Monitor for s/sx of bleeding   JRenold Genta PharmD, BCPS Clinical Pharmacist Phone for today - x[place]- [phone]/11/2015 11:06 AM

## 2015-09-28 NOTE — Progress Notes (Signed)
Initial Nutrition Assessment  DOCUMENTATION CODES:   Not applicable  INTERVENTION:   -Glucerna Shake po BID, each supplement provides 220 kcal and 10 grams of protein  NUTRITION DIAGNOSIS:   Inadequate oral intake related to  (taste changes) as evidenced by per patient/family report, energy intake < or equal to 75% for > or equal to 1 month.  GOAL:   Patient will meet greater than or equal to 90% of their needs  MONITOR:   PO intake, Supplement acceptance, Labs, Weight trends, Skin, I & O's  REASON FOR ASSESSMENT:   Malnutrition Screening Tool    ASSESSMENT:   [name]  is a 75 y.o. male, w dm2  Has c/o redness left distal lower ext,  Pt presented to office after having been taking keflex without benefit that was given to him by ED.  Pt will be admitted for cellulitis.    Pt admitted with lt leg cellulitis.   Hx obtained from pt and wife at bedside. Both confirm poor appetite and weight loss over the past 2-3 months. Per pt, he has lost his taste for food, describing that salty and sweet foods have extremely dull flavors and "even water just tastes bad". He denies any medications that may have taste changes as side effects and has had medical work-up with unknown etiology about this issue. Pt wife estimated that pt eats about 50% less of what he used to. Appetite has been improving since hospitalization (about 80% meal completion).   Pt endorses weight loss. However, noted that wt has been stable over the past year.   Per pt and wife, DM is well controlled (takes Metformin and onglyza PTA). They are interested in taking nutritional supplements (considered initiating PTA, but prefer Glucerna shakes to help maintain glycemic control. Encouraged continued use of supplements and discussed way to could increase protein intake in diet.   Nutrition-Focused physical exam completed. Findings are no fat depletion, mild muscle depletion, and no edema.   Case discussed with RN.    Labs reviewed: CBGS: 93-255.   Diet Order:  Diet Carb Modified Fluid consistency: Thin; Room service appropriate? Yes  Skin:  Wound (see comment) (lt leg cellulitis)  Last BM:  09/27/15  Height:   Ht Readings from Last 1 Encounters:  09/27/15 5' 10"  (1.778 m)    Weight:   Wt Readings from Last 1 Encounters:  09/28/15 164 lb 9.6 oz (74.7 kg)    Ideal Body Weight:  75.5 kg  BMI:  Body mass index is 23.62 kg/m.  Estimated Nutritional Needs:   Kcal:  1700-1900  Protein:  80-95 grams  Fluid:  1.7-1.9 L  EDUCATION NEEDS:   Education needs addressed  Rosalin Buster A. Jimmye Norman, RD, LDN, CDE Pager: [phone] After hours Pager: [phone]

## 2015-09-29 LAB — GLUCOSE, CAPILLARY
GLUCOSE-CAPILLARY: 144 mg/dL — AB (ref 65–99)
GLUCOSE-CAPILLARY: 135 mg/dL — AB (ref 65–99)
Glucose-Capillary: 93 mg/dL (ref 65–99)
Glucose-Capillary: 193 mg/dL — ABNORMAL HIGH (ref 65–99)

## 2015-09-29 LAB — BASIC METABOLIC PANEL
ANION GAP: 8 (ref 5–15)
BUN: 16 mg/dL (ref 6–20)
CHLORIDE: 109 mmol/L (ref 101–111)
CO2: 25 mmol/L (ref 22–32)
CREATININE: 1.21 mg/dL (ref 0.61–1.24)
Calcium: 8 mg/dL — ABNORMAL LOW (ref 8.9–10.3)
GFR calc non Af Amer: 56 mL/min — ABNORMAL LOW (ref >60)
Glucose, Bld: 80 mg/dL (ref 65–99)
Potassium: 4.2 mmol/L (ref 3.5–5.1)
Sodium: 142 mmol/L (ref 135–145)

## 2015-09-29 LAB — PROTIME-INR
INR: 3
Prothrombin Time: 31.8 seconds — ABNORMAL HIGH (ref 11.4–15.2)

## 2015-09-29 LAB — HEMOGLOBIN A1C
Hgb A1c MFr Bld: 6.7 % — ABNORMAL HIGH (ref 4.8–5.6)
Mean Plasma Glucose: 146 mg/dL

## 2015-09-29 MED ORDER — HYDROCODONE-ACETAMINOPHEN 5-325 MG PO TABS
1.0000 | ORAL_TABLET | Freq: Once | ORAL | Status: AC
  Administered 2015-09-29: 1 via ORAL
  Filled 2015-09-29: qty 1

## 2015-09-29 MED ORDER — POLYETHYLENE GLYCOL 3350 17 G PO PACK: 17.0000 g | PACK | Freq: Every day | ORAL | Status: DC

## 2015-09-29 MED ORDER — WARFARIN SODIUM 3 MG PO TABS
3.0000 mg | ORAL_TABLET | Freq: Once | ORAL | Status: AC
  Administered 2015-09-29: 3 mg via ORAL
  Filled 2015-09-29: qty 1

## 2015-09-29 MED ORDER — POLYETHYLENE GLYCOL 3350 17 G PO PACK
17.0000 g | PACK | Freq: Every day | ORAL | Status: DC | PRN
  Administered 2015-09-29: 17 g via ORAL
  Filled 2015-09-29: qty 1

## 2015-09-29 NOTE — Progress Notes (Signed)
Patient ID: [name], male   DOB: 1940-05-01, 75 y.o.   MRN: [id number]                                                                PROGRESS NOTE                                                                                                                                                                                                             Patient Demographics:    [name], is a 75 y.o. male, DOB - [birth year], GEZ:[id number]  Admit date - 09/27/2015   Admitting Physician Jani Gravel, MD  Outpatient Primary MD for the patient is Jani Gravel, MD  LOS - 2  Outpatient Specialists:  No chief complaint on file.      Brief Narrative  75 yo male with dm2, apparently seen in ED 09/25/2015,  For cellulitis left distallower ext and tx with ancef iv and placed on keflex .  Pt presented on 09/27/2015 with increase in redness to the left distal lower ext and possible hematoma vs small abscess on the middle of the left distal lower ext.  Pt sent from office to hospital for iv abx.     Subjective:    [name] today has been afebrile overnite.  LLE cellulitis improving greatly.    Assessment  & Plan :    Active Problems:   Cellulitis   Cellulitis  With possible small abscess in the mid distal lower ext Cont vanco, cont zosyn. Iv 8/9=> Will continue for 1 more day and then discharge please on doxycycline 161m po bid x 1 week.  F/u with Dr. JJani Gravelin 1 week. Call office for appt  Tachycardia from pain of celluiltis resolved Will monitor  Portal vein thrombosis Coumadin pharmacy to dose  Thrombocytopenia Pt is on abx,  Will need close monitoring initially  This is also a reason for hospitalization.   Dm2 Cont current medications.   Anemia Repeat cbc in am    Code Status :  FULL CODE  Family Communication  :   Disposition Plan  : home, Anticipate discharge home tomorrow on doxycycline  Barriers For Discharge :   Consults  :  none  Procedures  :     DVT Prophylaxis  :  coumadin  Lab Results  Component Value Date   PLT 48 (L) 09/27/2015    Antibiotics  :    Anti-infectives    Start     Dose/Rate Route Frequency Ordered Stop   09/28/15 2000  vancomycin (VANCOCIN) 1,250 mg in sodium chloride 0.9 % 250 mL IVPB     1,250 mg 166.7 mL/hr over 90 Minutes Intravenous Every 24 hours 09/27/15 1912     09/28/15 0200  piperacillin-tazobactam (ZOSYN) IVPB 3.375 g     3.375 g 12.5 mL/hr over 240 Minutes Intravenous Every 8 hours 09/27/15 1842     09/27/15 1845  piperacillin-tazobactam (ZOSYN) IVPB 3.375 g     3.375 g 100 mL/hr over 30 Minutes Intravenous  Once 09/27/15 1841 09/27/15 2131   09/27/15 1845  vancomycin (VANCOCIN) 1,500 mg in sodium chloride 0.9 % 500 mL IVPB     1,500 mg 250 mL/hr over 120 Minutes Intravenous  Once 09/27/15 1843 09/27/15 2301        Objective:   Vitals:   09/28/15 1000 09/28/15 1418 09/28/15 2100 09/29/15 0501  BP: (!) 121/40 (!) 98/43 (!) 119/47 (!) 117/46  Pulse: 63 72 73 64  Resp:  20 18 18   Temp:  98.2 F (36.8 C) 98.8 F (37.1 C) 97.7 F (36.5 C)  TempSrc:   Oral Oral  SpO2:  100% 99% 98%  Weight:    75.7 kg (166 lb 12.8 oz)  Height:        Wt Readings from Last 3 Encounters:  09/29/15 75.7 kg (166 lb 12.8 oz)  09/25/15 75.8 kg (167 lb)  08/14/15 76.7 kg (169 lb)     Intake/Output Summary (Last 24 hours) at 09/29/15 2633 Last data filed at 09/29/15 0600  Gross per 24 hour  Intake             1050 ml  Output                0 ml  Net             1050 ml     Physical Exam  Awake Alert, Oriented X 3, No new F.N deficits, Normal affect [place].AT,PERRAL Supple Neck,No JVD, No cervical lymphadenopathy appriciated.  Symmetrical Chest wall movement, Good air movement bilaterally, CTAB RRR,No Gallops,Rubs or new Murmurs, No Parasternal Heave +ve B.Sounds, Abd Soft, No tenderness, No organomegaly appriciated, No rebound - guarding or rigidity. No Cyanosis, Clubbing or edema, No new Rash  or bruise    Redness improved greatly mostly from the 1/4=> 5/8 up the knee, there is area of fluctuance in the mid shin area about 3cm ? Hematoma vs small abscess. improving    Data Review:    CBC  Recent Labs Lab 09/25/15 0806 09/27/15 1900  WBC 4.3 4.2  HGB 9.9* 8.9*  HCT 29.3* 27.3*  PLT 49* 48*  MCV 93.9 93.5  MCH 31.7 30.5  MCHC 33.8 32.6  RDW 15.6* 15.1  LYMPHSABS 0.7  --   MONOABS 0.4  --   EOSABS 0.1  --   BASOSABS 0.0  --     Chemistries   Recent Labs Lab 09/25/15 0806 09/27/15 1900  NA 139 138  K 4.4 4.3  CL 109 109  CO2 23 22  GLUCOSE 183* 122*  BUN 17 18  CREATININE 0.98 1.12  CALCIUM 8.6* 8.4*  AST  --  24  ALT  --  19  ALKPHOS  --  49  BILITOT  --  0.8   ------------------------------------------------------------------------------------------------------------------  No results for input(s): CHOL, HDL, LDLCALC, TRIG, CHOLHDL, LDLDIRECT in the last 72 hours.  Lab Results  Component Value Date   HGBA1C 6.7 (H) 09/28/2015   ------------------------------------------------------------------------------------------------------------------ No results for input(s): TSH, T4TOTAL, T3FREE, THYROIDAB in the last 72 hours.  Invalid input(s): FREET3 ------------------------------------------------------------------------------------------------------------------ No results for input(s): VITAMINB12, FOLATE, FERRITIN, TIBC, IRON, RETICCTPCT in the last 72 hours.  Coagulation profile  Recent Labs Lab 09/25/15 0806 09/27/15 2010 09/29/15 0353  INR 2.90 2.72 3.00    No results for input(s): DDIMER in the last 72 hours.  Cardiac Enzymes No results for input(s): CKMB, TROPONINI, MYOGLOBIN in the last 168 hours.  Invalid input(s): CK ------------------------------------------------------------------------------------------------------------------ No results found for: BNP  Inpatient Medications  Scheduled Meds: . atorvastatin  40 mg Oral QPM    . feeding supplement (GLUCERNA SHAKE)  237 mL Oral BID BM  . ferrous sulfate  325 mg Oral BID WC  . glipiZIDE  10 mg Oral BID AC  . insulin aspart  0-5 Units Subcutaneous QHS  . insulin aspart  0-9 Units Subcutaneous TID WC  . linagliptin  5 mg Oral Daily  . losartan  50 mg Oral Daily  . metFORMIN  500 mg Oral BID WC  . piperacillin-tazobactam (ZOSYN)  IV  3.375 g Intravenous Q8H  . sodium chloride flush  3 mL Intravenous Q12H  . vancomycin  1,250 mg Intravenous Q24H  . Warfarin - Pharmacist Dosing Inpatient   Does not apply q1800   Continuous Infusions:  PRN Meds:.sodium chloride, acetaminophen **OR** acetaminophen, sodium chloride flush  Micro Results No results found for this or any previous visit (from the past 240 hour(s)).  Radiology Reports No results found.  Time Spent in minutes  30   Jani Gravel M.D on 09/29/2015 at 6:27 AM  Between 7am to 7pm - Pager - [phone]  After 7pm go to [email or web address] - password [facility]  [facility] -  Office  [phone]

## 2015-09-29 NOTE — Discharge Instructions (Signed)

## 2015-09-29 NOTE — Progress Notes (Signed)
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  Assessment: 75 y/o male admitted with cellulitis after taking Keflex outpatient without benefit. He takes warfarin PTA for h/o DVT and chronic portal vein thrombosis.   Home dose: 63m twice daily (verified twice daily dosing with patient). Last taken 8/9 am.  8/11- Asked patient why twice daily and he said he didn't know but he was told to do this per the RPh at Dr. [facility]. Called KDarl Householder RPh at Dr. [facility]Jul[facility] Rice[facility] and plan was 420mpo daily using 76m30mablets. But never that he needed to split up the dose.   INR now trending up at 3 today with lower po intake and on antibiotics. Plan is for patient to change to doxycyline tomorrow and discharge home. Discussed lowering dose with patient and RPH at Dr. KimJulianne Ricefice then having an INR check on Monday.   Goal of Therapy:  INR 2-3 Monitor platelets by anticoagulation protocol: Yes   Plan:  - Warfarin 3mg67m x1 tonight - Follow-up INR in AM - may need to go home on 3mg 27mly with scheduled INR check on Monday.  - Monitor for s/sx of bleeding   JessiSloan LeiterrmD, BCPS Clinical Pharmacist Phone for today - [phone] 9:14 AM

## 2015-09-30 ENCOUNTER — Inpatient Hospital Stay (HOSPITAL_COMMUNITY): Payer: Medicare Other

## 2015-09-30 DIAGNOSIS — D693 Immune thrombocytopenic purpura: Secondary | ICD-10-CM

## 2015-09-30 DIAGNOSIS — L03116 Cellulitis of left lower limb: Principal | ICD-10-CM

## 2015-09-30 LAB — MRSA PCR SCREENING: MRSA BY PCR: NEGATIVE

## 2015-09-30 LAB — COMPREHENSIVE METABOLIC PANEL
ALK PHOS: 42 U/L (ref 38–126)
ALT: 18 U/L (ref 17–63)
AST: 25 U/L (ref 15–41)
Albumin: 2.3 g/dL — ABNORMAL LOW (ref 3.5–5.0)
Anion gap: 8 (ref 5–15)
BILIRUBIN TOTAL: 0.7 mg/dL (ref 0.3–1.2)
BUN: 16 mg/dL (ref 6–20)
CALCIUM: 8.2 mg/dL — AB (ref 8.9–10.3)
CO2: 24 mmol/L (ref 22–32)
Chloride: 109 mmol/L (ref 101–111)
Creatinine, Ser: 1.22 mg/dL (ref 0.61–1.24)
GFR calc Af Amer: >60 mL/min (ref >60)
GFR, EST NON AFRICAN AMERICAN: 55 mL/min — AB (ref >60)
GLUCOSE: 115 mg/dL — AB (ref 65–99)
POTASSIUM: 4.2 mmol/L (ref 3.5–5.1)
Sodium: 141 mmol/L (ref 135–145)
TOTAL PROTEIN: 5.3 g/dL — AB (ref 6.5–8.1)

## 2015-09-30 LAB — CBC
HEMATOCRIT: 28.1 % — AB (ref 39.0–52.0)
HEMOGLOBIN: 9.1 g/dL — AB (ref 13.0–17.0)
MCH: 30.4 pg (ref 26.0–34.0)
MCHC: 32.4 g/dL (ref 30.0–36.0)
MCV: 94 fL (ref 78.0–100.0)
Platelets: 61 10*3/uL — ABNORMAL LOW (ref 150–400)
RBC: 2.99 MIL/uL — ABNORMAL LOW (ref 4.22–5.81)
RDW: 15.3 % (ref 11.5–15.5)
WBC: 3.3 10*3/uL — AB (ref 4.0–10.5)

## 2015-09-30 LAB — GLUCOSE, CAPILLARY
GLUCOSE-CAPILLARY: 106 mg/dL — AB (ref 65–99)
GLUCOSE-CAPILLARY: 172 mg/dL — AB (ref 65–99)
Glucose-Capillary: 175 mg/dL — ABNORMAL HIGH (ref 65–99)

## 2015-09-30 LAB — PROTIME-INR
INR: 3.17
Prothrombin Time: 33.2 seconds — ABNORMAL HIGH (ref 11.4–15.2)

## 2015-09-30 MED ORDER — WARFARIN SODIUM 2 MG PO TABS
2.0000 mg | ORAL_TABLET | Freq: Once | ORAL | Status: AC
  Administered 2015-09-30: 2 mg via ORAL
  Filled 2015-09-30: qty 1

## 2015-09-30 NOTE — Progress Notes (Signed)
Patient ID: RUAIRI STUTSMAN, male   DOB: [birth year], 75 y.o.   MRN: [id number]                                                                PROGRESS NOTE                                                                                                                                                                                                             Patient Demographics:    Fitzpatrick Alberico, is a 75 y.o. male, DOB - Aug 04, 1940, HYH:[id number]  Admit date - 09/27/2015   Admitting Physician Jani Gravel, MD  Outpatient Primary MD for the patient is Jani Gravel, MD  LOS - 3  Outpatient Specialists:  No chief complaint on file.      Brief Narrative  75 yo male with dm2, apparently seen in ED 09/25/2015,  For cellulitis left distallower ext and tx with ancef iv and placed on keflex .  Pt presented on 09/27/2015 with increase in redness to the left distal lower ext and possible hematoma vs small abscess on the middle of the left distal lower ext.  Pt sent from office to hospital for iv abx.     Subjective:    Kaled Bromwell today has been afebrile overnite.  LLE cellulitis improving , wife in room   Assessment  & Plan :    Active Problems:   Cellulitis   Cellulitis, left lower extremity, failed outpatient oral abx with keflex initially cellulitis is extensive from left medial foot to more than 80% of the left lower leg with a bulla left lateral leg, With possible small abscess in the mid distal lower ext He is treated with vanco/ zosyn since admission Venous dopper left lower extremity no DVT, will get left leg x ray, and get ultrasound to r/o forming abscess  Tachycardia from pain of celluiltis resolved Will monitor  Portal vein thrombosis Coumadin pharmacy to dose  Thrombocytopenia Pt is on abx,  Will need close monitoring initially  This is also a reason for hospitalization.  Now back to baseline (aroudn 60's)  Non insulin dependent DM2 Cont current medications.  a1c  6.7  Anemia, chronic , hgb at baseline   Code Status :  FULL CODE  Family Communication  : wife in  room  Disposition Plan  :  discharge home on doxycycline in a few days  Barriers For Discharge : significant cellulitis  Consults  :  none  Procedures  : none  DVT Prophylaxis  :  coumadin  Lab Results  Component Value Date   PLT 61 (L) 09/30/2015    Antibiotics  :    Anti-infectives    Start     Dose/Rate Route Frequency Ordered Stop   09/28/15 2000  vancomycin (VANCOCIN) 1,250 mg in sodium chloride 0.9 % 250 mL IVPB     1,250 mg 166.7 mL/hr over 90 Minutes Intravenous Every 24 hours 09/27/15 1912     09/28/15 0200  piperacillin-tazobactam (ZOSYN) IVPB 3.375 g     3.375 g 12.5 mL/hr over 240 Minutes Intravenous Every 8 hours 09/27/15 1842     09/27/15 1845  piperacillin-tazobactam (ZOSYN) IVPB 3.375 g     3.375 g 100 mL/hr over 30 Minutes Intravenous  Once 09/27/15 1841 09/27/15 2131   09/27/15 1845  vancomycin (VANCOCIN) 1,500 mg in sodium chloride 0.9 % 500 mL IVPB     1,500 mg 250 mL/hr over 120 Minutes Intravenous  Once 09/27/15 1843 09/27/15 2301        Objective:   Vitals:   09/29/15 2050 09/30/15 0219 09/30/15 0440 09/30/15 1523  BP: (!) 133/50  116/78 (!) 124/55  Pulse: 72  72 71  Resp: 20  18 18   Temp: 98.2 F (36.8 C)  98 F (36.7 C) 98.5 F (36.9 C)  TempSrc:   Oral   SpO2: 97%  98% 98%  Weight:  79 kg (174 lb 1.6 oz)    Height:        Wt Readings from Last 3 Encounters:  09/30/15 79 kg (174 lb 1.6 oz)  09/25/15 75.8 kg (167 lb)  08/14/15 76.7 kg (169 lb)     Intake/Output Summary (Last 24 hours) at 09/30/15 1548 Last data filed at 09/30/15 1000  Gross per 24 hour  Intake              780 ml  Output                0 ml  Net              780 ml     Physical Exam  Awake Alert, Oriented X 3, No new F.N deficits, Normal affect [place].AT,PERRAL Supple Neck,No JVD, No cervical lymphadenopathy appriciated.  Symmetrical Chest wall  movement, Good air movement bilaterally, CTAB RRR,No Gallops,Rubs or new Murmurs, No Parasternal Heave +ve B.Sounds, Abd Soft, No tenderness, No organomegaly appriciated, No rebound - guarding or rigidity. No Cyanosis, Clubbing or edema, No new Rash or bruise    Redness improved greatly mostly from the 1/4=> 5/8 up the knee, there is area of fluctuance in the mid shin area about 3cm ? Hematoma vs small abscess. improving    Data Review:    CBC  Recent Labs Lab 09/25/15 0806 09/27/15 1900 09/30/15 0750  WBC 4.3 4.2 3.3*  HGB 9.9* 8.9* 9.1*  HCT 29.3* 27.3* 28.1*  PLT 49* 48* 61*  MCV 93.9 93.5 94.0  MCH 31.7 30.5 30.4  MCHC 33.8 32.6 32.4  RDW 15.6* 15.1 15.3  LYMPHSABS 0.7  --   --   MONOABS 0.4  --   --   EOSABS 0.1  --   --   BASOSABS 0.0  --   --     Chemistries   Recent Labs Lab  09/25/15 0806 09/27/15 1900 09/29/15 0701 09/30/15 0750  NA 139 138 142 141  K 4.4 4.3 4.2 4.2  CL 109 109 109 109  CO2 23 22 25 24   GLUCOSE 183* 122* 80 115*  BUN 17 18 16 16   CREATININE 0.98 1.12 1.21 1.22  CALCIUM 8.6* 8.4* 8.0* 8.2*  AST  --  24  --  25  ALT  --  19  --  18  ALKPHOS  --  49  --  42  BILITOT  --  0.8  --  0.7   ------------------------------------------------------------------------------------------------------------------ No results for input(s): CHOL, HDL, LDLCALC, TRIG, CHOLHDL, LDLDIRECT in the last 72 hours.  Lab Results  Component Value Date   HGBA1C 6.7 (H) 09/28/2015   ------------------------------------------------------------------------------------------------------------------ No results for input(s): TSH, T4TOTAL, T3FREE, THYROIDAB in the last 72 hours.  Invalid input(s): FREET3 ------------------------------------------------------------------------------------------------------------------ No results for input(s): VITAMINB12, FOLATE, FERRITIN, TIBC, IRON, RETICCTPCT in the last 72 hours.  Coagulation profile  Recent Labs Lab  09/25/15 0806 09/27/15 2010 09/29/15 0353 09/30/15 0750  INR 2.90 2.72 3.00 3.17    No results for input(s): DDIMER in the last 72 hours.  Cardiac Enzymes No results for input(s): CKMB, TROPONINI, MYOGLOBIN in the last 168 hours.  Invalid input(s): CK ------------------------------------------------------------------------------------------------------------------ No results found for: BNP  Inpatient Medications  Scheduled Meds: . atorvastatin  40 mg Oral QPM  . feeding supplement (GLUCERNA SHAKE)  237 mL Oral BID BM  . ferrous sulfate  325 mg Oral BID WC  . glipiZIDE  10 mg Oral BID AC  . insulin aspart  0-5 Units Subcutaneous QHS  . insulin aspart  0-9 Units Subcutaneous TID WC  . linagliptin  5 mg Oral Daily  . losartan  50 mg Oral Daily  . piperacillin-tazobactam (ZOSYN)  IV  3.375 g Intravenous Q8H  . sodium chloride flush  3 mL Intravenous Q12H  . vancomycin  1,250 mg Intravenous Q24H  . warfarin  2 mg Oral ONCE-1800  . Warfarin - Pharmacist Dosing Inpatient   Does not apply q1800   Continuous Infusions:  PRN Meds:.sodium chloride, acetaminophen **OR** acetaminophen, polyethylene glycol, sodium chloride flush  Micro Results Recent Results (from the past 240 hour(s))  MRSA PCR Screening     Status: None   Collection Time: 09/30/15 10:05 AM  Result Value Ref Range Status   MRSA by PCR NEGATIVE NEGATIVE Final    Comment:        The GeneXpert MRSA Assay (FDA approved for NASAL specimens only), is one component of a comprehensive MRSA colonization surveillance program. It is not intended to diagnose MRSA infection nor to guide or monitor treatment for MRSA infections.     Radiology Reports No results found.  Time Spent in minutes  80   Leiani Enright M.D PhDon 09/30/2015 at 3:48 PM  Between 7am to 7pm - Pager - [phone]  After 7pm go to [email or web address] - password [facility]  [facility] -  Office  [phone]

## 2015-09-30 NOTE — Progress Notes (Signed)
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  No Known Allergies  Patient Measurements: Height: 5' 10"  (177.8 cm) Weight: 174 lb 1.6 oz (79 kg) IBW/kg (Calculated) : 73  Vital Signs: Temp: 98 F (36.7 C) (08/12 0440) Temp Source: Oral (08/12 0440) BP: 116/78 (08/12 0440) Pulse Rate: 72 (08/12 0440)  Labs:  Recent Labs  09/27/15 1900 09/27/15 2010 09/29/15 0353 09/29/15 0701 09/30/15 0750  HGB 8.9*  --   --   --  9.1*  HCT 27.3*  --   --   --  28.1*  PLT 48*  --   --   --  61*  LABPROT  --  29.4* 31.8*  --  33.2*  INR  --  2.72 3.00  --  3.17  CREATININE 1.12  --   --  1.21 1.22    Estimated Creatinine Clearance: 52.4 mL/min (by C-G formula based on SCr of 1.22 mg/dL).  Assessment: 75 y/o male admitted with cellulitis after taking Keflex outpatient without benefit. He takes warfarin PTA for h/o DVT and chronic portal vein thrombosis.   Home dose: 432m twice daily (verified twice daily dosing with patient). Last taken 8/9 am.  8/11- Asked patient why twice daily and he said he didn't know but he was told to do this per the RPh at Dr. [facility]. Called KDarl Householder RPh at Dr. [facility]Julian[facility] Riceoffice and plan was 445mpo daily using 32m43mablets. But never that he needed to split up the dose. Discussed with patient and wife.   INR now trending up at 3.17 after reduced dose to 3mg44msterday. Platelets up to 61 today. H/H low-stable.    Goal of Therapy:  INR 2-3 Monitor platelets by anticoagulation protocol: Yes   Plan:  - Give decreased dose of warfarin 32mg 79mx1 tonight due to slightly elevated INR.  - Follow-up INR in AM if still here - Monitor for s/sx of bleeding  *Recommend home on 32mg d11my.  *Has INR check scheduled for Monday 8/14 with Dr. Kim's Julianne Ricee at 3:30 PM.    JessicSloan LeitermD, BCPS Clinical Pharmacist Phone for today - [phone] 12:12 PM

## 2015-10-01 DIAGNOSIS — D696 Thrombocytopenia, unspecified: Secondary | ICD-10-CM

## 2015-10-01 DIAGNOSIS — Z7901 Long term (current) use of anticoagulants: Secondary | ICD-10-CM

## 2015-10-01 LAB — CBC
HCT: 26.6 % — ABNORMAL LOW (ref 39.0–52.0)
HEMOGLOBIN: 8.8 g/dL — AB (ref 13.0–17.0)
MCH: 30.7 pg (ref 26.0–34.0)
MCHC: 33.1 g/dL (ref 30.0–36.0)
MCV: 92.7 fL (ref 78.0–100.0)
Platelets: 59 10*3/uL — ABNORMAL LOW (ref 150–400)
RBC: 2.87 MIL/uL — AB (ref 4.22–5.81)
RDW: 15.3 % (ref 11.5–15.5)
WBC: 3.4 10*3/uL — ABNORMAL LOW (ref 4.0–10.5)

## 2015-10-01 LAB — GLUCOSE, CAPILLARY
GLUCOSE-CAPILLARY: 186 mg/dL — AB (ref 65–99)
GLUCOSE-CAPILLARY: 179 mg/dL — AB (ref 65–99)
Glucose-Capillary: 104 mg/dL — ABNORMAL HIGH (ref 65–99)
Glucose-Capillary: 197 mg/dL — ABNORMAL HIGH (ref 65–99)

## 2015-10-01 LAB — BASIC METABOLIC PANEL
Anion gap: 10 (ref 5–15)
BUN: 15 mg/dL (ref 6–20)
CALCIUM: 8.5 mg/dL — AB (ref 8.9–10.3)
CHLORIDE: 108 mmol/L (ref 101–111)
CO2: 24 mmol/L (ref 22–32)
Creatinine, Ser: 1.11 mg/dL (ref 0.61–1.24)
GLUCOSE: 103 mg/dL — AB (ref 65–99)
POTASSIUM: 4.4 mmol/L (ref 3.5–5.1)
SODIUM: 142 mmol/L (ref 135–145)

## 2015-10-01 LAB — MAGNESIUM: Magnesium: 1.9 mg/dL (ref 1.7–2.4)

## 2015-10-01 LAB — PROTIME-INR
INR: 2.86
PROTHROMBIN TIME: 30.6 s — AB (ref 11.4–15.2)

## 2015-10-01 MED ORDER — WARFARIN SODIUM 3 MG PO TABS
3.0000 mg | ORAL_TABLET | Freq: Once | ORAL | Status: AC
  Administered 2015-10-01: 3 mg via ORAL
  Filled 2015-10-01: qty 1

## 2015-10-01 NOTE — Progress Notes (Signed)
Pharmacy Antibiotic Note [name] is a 75 y.o. male admitted on 09/27/2015 with L lower leg cellulitis that failed to respond to outpt PO Keflex.  Pharmacy has been consulted for Zosyn and vancomycin dosing.  SCr stable.   MD changing to po doxy on discharge -plan for Monday.  LLE Xray - Soft tissue swelling -no subcu gas or acute osseous  LLE [place]- negative for abscess Leg improving overall  Plan: Continue Zosyn 3.375g IV every 8 hours - Extended infusion. Continue Vancomycin 1210m IV every 24 hours.  Follow-up change to oral doxycycline in AM.     Height: 5' 10"  (177.8 cm) Weight: 174 lb 1.6 oz (79 kg) IBW/kg (Calculated) : 73  Temp (24hrs), Avg:98.6 F (37 C), Min:98.4 F (36.9 C), Max:98.9 F (37.2 C)   Recent Labs Lab 09/25/15 0806 09/27/15 1900 09/29/15 0701 09/30/15 0750 10/01/15 0510  WBC 4.3 4.2  --  3.3* 3.4*  CREATININE 0.98 1.12 1.21 1.22 1.11    Estimated Creatinine Clearance: 57.5 mL/min (by C-G formula based on SCr of 1.11 mg/dL).    No Known Allergies  Antimicrobials this admission: 8/9 Zosyn >>  8/9 vancomycin >>   Dose adjustments this admission: n/a  Microbiology results: MRSA PCR negative  JSloan Leiter PharmD, BCPS Clinical Pharmacist [phone]/13/2017, 1:45 PM Pager: [phone]

## 2015-10-01 NOTE — Consult Note (Signed)
[facility] Nurse wound consult note Reason for Consult: LLE with erythema, warmth and edema with ulceration. Patient with history of thrombus on RLE with resultant full thickness wound. Is followed by [facility] (has routine appointment in November).  Wound type: Infectious vs venous occlusion Pressure Ulcer POA: No Measurement: Affected area measures 9cm x 6cm with skin peeling (epidermal sloughing) and erythema in an area measuring 6cm x 4cm.  In the center of this is a 2cm x 2.5cm area of eschar.  Wound bed: As described above.   Drainage (amount, consistency, odor) Dried serosanguinous places on bed linens, no active exudate at this time. Periwound: Line made previously with surgical site marking pen to outline initial presentation of erythema indicates resolving erythema. Patient and wife agree that edema is markedly better. Dressing procedure/placement/frequency: I will add a topical antimicrobial dressing twice daily (xeroform gauze).  Suggest surgical consult (either [facility] or [facility]) for assessment of the subcutaneous fluid collection. If you agree, please order. [place] nursing team will not follow, but will remain available to this patient, the nursing and medical teams.  Please re-consult if needed. Thanks, Maudie Flakes, MSN, RN, [place], [facility]  Pager# [phone]

## 2015-10-01 NOTE — Progress Notes (Signed)
[name], male   DOB: 06-15-1940, 75 y.o.   MRN: [id number]                                                                PROGRESS NOTE                                                                                                                                                                                                             Patient Demographics:    [name], is a 75 y.o. male, DOB - [birth year], SAY:[id number]  Admit date - 09/27/2015   Admitting Physician Jani Gravel, MD  Outpatient Primary MD for the patient is Jani Gravel, MD  LOS - 4  Outpatient Specialists:  No chief complaint on file.      Brief Narrative  75 yo male with dm2, apparently seen in ED 09/25/2015,  For cellulitis left distallower ext and tx with ancef iv and placed on keflex .  Pt presented on 09/27/2015 with increase in redness to the left distal lower ext and possible hematoma vs small abscess on the middle of the left distal lower ext.  Pt sent from office to hospital for iv abx.     Subjective:    [name] today has been afebrile overnite.  LLE cellulitis improving , bulla on left lateral leg popped last night, some scan blood in bed sheet, wound with central eschar now, no active drainage this am wife in room   Assessment  & Plan :    Active Problems:   Cellulitis   Cellulitis, left lower extremity, failed outpatient oral abx with keflex initially cellulitis is extensive from left medial foot to more than 80% of the left lower leg with a bulla left lateral leg with s few blood filled blisters around the bulla He is treated with vanco/ zosyn since admission Venous dopper left lower extremity no DVT,  left leg x ray no bone infection, no subcutaneous air,  ultrasound no abscess, wound care consulted Over all improving with abx,  Remote possibility of coumadin induced skin necrosis, patient has similar presentation a few years ago to his left leg, he leg wound healed after a  course of abx. He has been on coumadin since the late 90's. Will refer to his hematology Dr  Granfortuna.   Tachycardia from pain of celluiltis resolved Will monitor  Portal vein thrombosis Coumadin pharmacy to dose  Thrombocytopenia Pt is on abx,  Will need close monitoring initially  This is also a reason for hospitalization.  Now back to baseline (aroudn 60's)  Non insulin dependent DM2 Cont current medications.  a1c 6.7  Anemia, chronic , hgb at baseline   Code Status :  FULL CODE  Family Communication  : wife in room  Disposition Plan  :  discharge home on doxycycline, likely on Monday with home health RN for wound care.  Barriers For Discharge : significant cellulitis  Consults  :  none  Procedures  : none  DVT Prophylaxis  :  coumadin  Lab Results  Component Value Date   PLT 59 (L) 10/01/2015    Antibiotics  :    Anti-infectives    Start     Dose/Rate Route Frequency Ordered Stop   09/28/15 2000  vancomycin (VANCOCIN) 1,250 mg in sodium chloride 0.9 % 250 mL IVPB     1,250 mg 166.7 mL/hr over 90 Minutes Intravenous Every 24 hours 09/27/15 1912     09/28/15 0200  piperacillin-tazobactam (ZOSYN) IVPB 3.375 g     3.375 g 12.5 mL/hr over 240 Minutes Intravenous Every 8 hours 09/27/15 1842     09/27/15 1845  piperacillin-tazobactam (ZOSYN) IVPB 3.375 g     3.375 g 100 mL/hr over 30 Minutes Intravenous  Once 09/27/15 1841 09/27/15 2131   09/27/15 1845  vancomycin (VANCOCIN) 1,500 mg in sodium chloride 0.9 % 500 mL IVPB     1,500 mg 250 mL/hr over 120 Minutes Intravenous  Once 09/27/15 1843 09/27/15 2301        Objective:   Vitals:   09/30/15 1523 09/30/15 2123 10/01/15 0505 10/01/15 1238  BP: (!) 124/55 (!) 130/57 (!) 119/52 (!) 125/43  Pulse: 71 70 66 61  Resp: 18 18 18 19   Temp: 98.5 F (36.9 C) 98.9 F (37.2 C)  98.4 F (36.9 C)  TempSrc:  Oral    SpO2: 98% 97% 97% 99%  Weight:      Height:        Wt Readings from Last 3 Encounters:    09/30/15 79 kg (174 lb 1.6 oz)  09/25/15 75.8 kg (167 lb)  08/14/15 76.7 kg (169 lb)     Intake/Output Summary (Last 24 hours) at 10/01/15 1254 Last data filed at 10/01/15 1029  Gross per 24 hour  Intake              690 ml  Output              550 ml  Net              140 ml     Physical Exam  Awake Alert, Oriented X 3, No new F.N deficits, Normal affect [place].AT,PERRAL Supple Neck,No JVD, No cervical lymphadenopathy appriciated.  Symmetrical Chest wall movement, Good air movement bilaterally, CTAB RRR,No Gallops,Rubs or new Murmurs, No Parasternal Heave +ve B.Sounds, Abd Soft, No tenderness, No organomegaly appriciated, No rebound - guarding or rigidity. No Cyanosis, Clubbing or edema,  Left leg:  Erythema continue to subside, bulla on left lateral leg has popped, now with a central eschar about 1cm in diameter, no active drainage,     Data Review:    CBC  Recent Labs Lab 09/25/15 0806 09/27/15 1900 09/30/15 0750 10/01/15 0510  WBC 4.3 4.2 3.3* 3.4*  HGB 9.9* 8.9* 9.1*  8.8*  HCT 29.3* 27.3* 28.1* 26.6*  PLT 49* 48* 61* 59*  MCV 93.9 93.5 94.0 92.7  MCH 31.7 30.5 30.4 30.7  MCHC 33.8 32.6 32.4 33.1  RDW 15.6* 15.1 15.3 15.3  LYMPHSABS 0.7  --   --   --   MONOABS 0.4  --   --   --   EOSABS 0.1  --   --   --   BASOSABS 0.0  --   --   --     Chemistries   Recent Labs Lab 09/25/15 0806 09/27/15 1900 09/29/15 0701 09/30/15 0750 10/01/15 0510  NA 139 138 142 141 142  K 4.4 4.3 4.2 4.2 4.4  CL 109 109 109 109 108  CO2 23 22 25 24 24   GLUCOSE 183* 122* 80 115* 103*  BUN 17 18 16 16 15   CREATININE 0.98 1.12 1.21 1.22 1.11  CALCIUM 8.6* 8.4* 8.0* 8.2* 8.5*  MG  --   --   --   --  1.9  AST  --  24  --  25  --   ALT  --  19  --  18  --   ALKPHOS  --  49  --  42  --   BILITOT  --  0.8  --  0.7  --    ------------------------------------------------------------------------------------------------------------------ No results for input(s): CHOL, HDL,  LDLCALC, TRIG, CHOLHDL, LDLDIRECT in the last 72 hours.  Lab Results  Component Value Date   HGBA1C 6.7 (H) 09/28/2015   ------------------------------------------------------------------------------------------------------------------ No results for input(s): TSH, T4TOTAL, T3FREE, THYROIDAB in the last 72 hours.  Invalid input(s): FREET3 ------------------------------------------------------------------------------------------------------------------ No results for input(s): VITAMINB12, FOLATE, FERRITIN, TIBC, IRON, RETICCTPCT in the last 72 hours.  Coagulation profile  Recent Labs Lab 09/25/15 0806 09/27/15 2010 09/29/15 0353 09/30/15 0750 10/01/15 0510  INR 2.90 2.72 3.00 3.17 2.86    No results for input(s): DDIMER in the last 72 hours.  Cardiac Enzymes No results for input(s): CKMB, TROPONINI, MYOGLOBIN in the last 168 hours.  Invalid input(s): CK ------------------------------------------------------------------------------------------------------------------ No results found for: BNP  Inpatient Medications  Scheduled Meds: . atorvastatin  40 mg Oral QPM  . feeding supplement (GLUCERNA SHAKE)  237 mL Oral BID BM  . ferrous sulfate  325 mg Oral BID WC  . glipiZIDE  10 mg Oral BID AC  . insulin aspart  0-5 Units Subcutaneous QHS  . insulin aspart  0-9 Units Subcutaneous TID WC  . linagliptin  5 mg Oral Daily  . losartan  50 mg Oral Daily  . piperacillin-tazobactam (ZOSYN)  IV  3.375 g Intravenous Q8H  . sodium chloride flush  3 mL Intravenous Q12H  . vancomycin  1,250 mg Intravenous Q24H  . warfarin  3 mg Oral ONCE-1800  . Warfarin - Pharmacist Dosing Inpatient   Does not apply q1800   Continuous Infusions:  PRN Meds:.sodium chloride, acetaminophen **OR** acetaminophen, polyethylene glycol, sodium chloride flush  Micro Results Recent Results (from the past 240 hour(s))  MRSA PCR Screening     Status: None   Collection Time: 09/30/15 10:05 AM  Result Value  Ref Range Status   MRSA by PCR NEGATIVE NEGATIVE Final    Comment:        The GeneXpert MRSA Assay (FDA approved for NASAL specimens only), is one component of a comprehensive MRSA colonization surveillance program. It is not intended to diagnose MRSA infection nor to guide or monitor treatment for MRSA infections.     Radiology Reports Dg Tibia/fibula  Left  Result Date: 09/30/2015 CLINICAL DATA:  75 year old male with cellulitis in the left lower extremity with pain. Initial encounter. EXAM: LEFT TIBIA AND FIBULA - 2 VIEW COMPARISON:  Lower extremity ultrasound from today reported separately. FINDINGS: Bone mineralization is within normal limits for age. Alignment at the left knee and ankle is preserved. Left tibia and fibula intact. No acute osseous abnormality identified. Widespread subcutaneous stranding compatible with inflammation. No subcutaneous gas. Mild calcified peripheral vascular disease. No radiopaque foreign body identified. IMPRESSION: Soft tissue swelling and stranding. No subcutaneous gas or acute osseous abnormality identified about the left tib-fib. Electronically Signed   By: Genevie Ann M.D.   On: 09/30/2015 16:42   [place] Extrem Low Left Ltd  Result Date: 09/30/2015 CLINICAL DATA:  Cellulitis, redness that distal LEFT leg for 3 days EXAM: ULTRASOUND LEFT LOWER EXTREMITY LIMITED TECHNIQUE: Ultrasound examination of the lower extremity soft tissues was performed in the area of clinical concern. COMPARISON:  None FINDINGS: Subcutaneous edema at the site of clinical concern with mild skin thickening. No focal fluid collection identified to suggest abscess. No calcification or solid mass. IMPRESSION: Subcutaneous edema without focal mass or fluid collection. Electronically Signed   By: Lavonia Dana M.D.   On: 09/30/2015 16:36    Time Spent in minutes  44   Aerika Groll M.D PhDon 10/01/2015 at 12:54 PM  Between 7am to 7pm - Pager - [phone]  After 7pm go to [email or web address] -  password [facility]  [facility] -  Office  [phone]

## 2015-10-01 NOTE — Progress Notes (Signed)
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  Assessment: 75 y/o male admitted with cellulitis after taking Keflex outpatient without benefit. He takes warfarin PTA for h/o DVT and chronic portal vein thrombosis.   Home dose: 54m twice daily (verified twice daily dosing with patient). Last taken 8/9 am.  8/11- Asked patient why twice daily and he said he didn't know but he was told to do this per the RPh at Dr. [facility]. Called KDarl Householder RPh at Dr. [facility]Julian[facility] Riceoffice and plan was 438mpo daily using 42m85mablets. But never that he needed to split up the dose. Discussed with patient and wife.   INR now trending up at 3.17 after reduced dose to 3mg69msterday. Platelets 59 today- chronically in 40-50s with cirrhosis. H/H low-stable.    Goal of Therapy:  INR 2-3 Monitor platelets by anticoagulation protocol: Yes   Plan:  - Give warfarin 3mg 74mx1 tonight. - Follow-up INR in AM if still here - Monitor for s/sx of bleeding  *Recommend home on 3 mg daily.  *Has INR check scheduled for Monday 8/14 with Dr. Kim'sJulianne Ricece at 3:30 PM.   JessiSloan LeiterrmD, BCPS Clinical Pharmacist Phone for today - [phone] 9:37 AM

## 2015-10-02 LAB — PROTIME-INR
INR: 2.46
Prothrombin Time: 27.1 seconds — ABNORMAL HIGH (ref 11.4–15.2)

## 2015-10-02 LAB — BASIC METABOLIC PANEL
Anion gap: 5 (ref 5–15)
BUN: 14 mg/dL (ref 6–20)
CALCIUM: 8.4 mg/dL — AB (ref 8.9–10.3)
CHLORIDE: 111 mmol/L (ref 101–111)
CO2: 24 mmol/L (ref 22–32)
CREATININE: 1.23 mg/dL (ref 0.61–1.24)
GFR calc non Af Amer: 55 mL/min — ABNORMAL LOW (ref >60)
GLUCOSE: 126 mg/dL — AB (ref 65–99)
Potassium: 4 mmol/L (ref 3.5–5.1)
Sodium: 140 mmol/L (ref 135–145)

## 2015-10-02 LAB — CBC
HEMATOCRIT: 28.3 % — AB (ref 39.0–52.0)
HEMOGLOBIN: 9.1 g/dL — AB (ref 13.0–17.0)
MCH: 30.6 pg (ref 26.0–34.0)
MCHC: 32.2 g/dL (ref 30.0–36.0)
MCV: 95.3 fL (ref 78.0–100.0)
Platelets: 59 10*3/uL — ABNORMAL LOW (ref 150–400)
RBC: 2.97 MIL/uL — ABNORMAL LOW (ref 4.22–5.81)
RDW: 15.3 % (ref 11.5–15.5)
WBC: 4.2 10*3/uL (ref 4.0–10.5)

## 2015-10-02 LAB — GLUCOSE, CAPILLARY
GLUCOSE-CAPILLARY: 192 mg/dL — AB (ref 65–99)
Glucose-Capillary: 118 mg/dL — ABNORMAL HIGH (ref 65–99)

## 2015-10-02 MED ORDER — WARFARIN SODIUM 3 MG PO TABS
3.0000 mg | ORAL_TABLET | Freq: Once | ORAL | Status: DC
  Filled 2015-10-02: qty 1

## 2015-10-02 MED ORDER — GLUCERNA SHAKE PO LIQD: 237.0000 mL | Freq: Two times a day (BID) | ORAL | 0 refills | Status: DC

## 2015-10-02 MED ORDER — DOXYCYCLINE HYCLATE 100 MG PO CAPS: 100.0000 mg | ORAL_CAPSULE | Freq: Two times a day (BID) | ORAL | 0 refills | Status: DC

## 2015-10-02 NOTE — Care Management Important Message (Signed)
Important Message  Patient Details  Name: [name] MRN: [id number] Date of Birth: [birth year]   Medicare Important Message Given:  Yes    Nathen May 10/02/2015, 10:55 AM

## 2015-10-02 NOTE — Progress Notes (Signed)
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  Assessment: 75 y/o male admitted with cellulitis after taking Keflex outpatient without benefit. 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 Admit INR 2.72 on 8/10 pm (therapeutic). INR remains therapeutic today at 2.46. Hgb low-stable at 9.1. Plts 59 (noted that plt in 40s over past few months-chronic TTP w/ cirrhosis). No bleeding noted. Po intake improving.   Home dose: 556m twice daily (verified twice daily dosing with patient). Last taken 8/9 am.  8/11- Asked patient why twice daily and he said he didn't know but he was told to do this per the RPh at Dr. [facility]. Called KDarl Householder RPh at Dr. [facility] Rice[facility] and plan was 429mpo daily using 56m11mablets. But never that he needed to split up the dose. Discussed with patient and wife.   Goal of Therapy:  INR 2-3 Monitor platelets by anticoagulation protocol: Yes   Plan:  -Warfarin 3mg656m x1  -Daily INR -Monitor for s/sx of bleeding  *Recommend home on 3 mg daily.  *Has INR check scheduled for Monday 8/14 with Dr. Kim'Julianne Riceice at 3:30 PM.   Rob Stephens NovemberarmD Clinical Pharmacist 9:30 AM, 10/02/2015

## 2015-10-02 NOTE — Care Management Note (Signed)
Case Management Note  Patient Details  Name: [name] MRN: [id number] Date of Birth: 1940-07-08  Subjective/Objective:                    Action/Plan: Plan is to d/c to home with home health services (RN).  Expected Discharge Date:   10/02/2015          Expected Discharge Plan:  [place] (Resides with wife [name], independent , no DME)  In-House Referral:     Discharge planning Services  CM Consult  Post Acute Care Choice:    Choice offered to:  Patient, Spouse  DME Arranged:    DME Agency:     HH Arranged:  RN [place] Agency:  [facility] [employer] (Referral made with Butch Penny)  Status of Service:  In process, will continue to follow  If discussed at Long Length of Stay Meetings, dates discussed:    Additional Comments:  Sharin Mons, RN 10/02/2015, 10:18 AM

## 2015-10-02 NOTE — Evaluation (Signed)
Physical Therapy Evaluation Patient Details Name: [name] MRN: [id number] DOB: 1940-10-08 Today's Date: 10/02/2015   History of Present Illness  75 yo male with referral to PT was assessed for gait and balance changes after sustaining a deep infection on LLE lower leg.  Pt is cleared for osteomyelitis and will follow up outpatient for medical needs but no acute PT needs.  Clinical Impression  Pt is up to walk with PT demonstrating minor concerns for himself about being protective of his LLE, and agrees to use SPC at home which he already owns.  Will discharge his treatment for now and pt is being sent home by MD today for follow up with his medical providers.    Follow Up Recommendations No PT follow up    Equipment Recommendations  None recommended by PT (has already obtained a SPC at home)    Recommendations for Other Services       Precautions / Restrictions Precautions Precautions: Fall (until cleared) Restrictions Weight Bearing Restrictions: No      Mobility  Bed Mobility Overal bed mobility: Modified Independent             General bed mobility comments: Pt is able to manage blankets and using HOB elevated  Transfers Overall transfer level: Needs assistance Equipment used: 1 person hand held assist (for safety) Transfers: Sit to/from [employer] Transfers Sit to Stand: Supervision Stand pivot transfers: Supervision       General transfer comment: for safety supervised  Ambulation/Gait Ambulation/Gait assistance: Min guard (for safety) Ambulation Distance (Feet): 250 Feet Assistive device: 1 person hand held assist (to protect for safety) Gait Pattern/deviations: Step-through pattern;Wide base of support;Decreased stride length Gait velocity: reduced Gait velocity interpretation: Below normal speed for age/gender General Gait Details: Pt is careful with LLE but not demonstrating an outright fall risk  Stairs            Wheelchair  Mobility    Modified Rankin (Stroke Patients Only)       Balance Overall balance assessment: No apparent balance deficits (not formally assessed)                                           Pertinent Vitals/Pain Pain Assessment: No/denies pain    Home Living Family/patient expects to be discharged to:: Private residence Living Arrangements: Spouse/significant other;Children Available Help at Discharge: Family;Available 24 hours/day Type of Home: House Home Access: Stairs to enter Entrance Stairs-Rails: Right;Left (wide spacing on rails) Entrance Stairs-Number of Steps: 4 Home Layout: One level Home Equipment: Walker - 2 wheels;Cane - single point Additional Comments: has not needed the devices    Prior Function Level of Independence: Independent               Hand Dominance        Extremity/Trunk Assessment   Upper Extremity Assessment: Overall WFL for tasks assessed           Lower Extremity Assessment: Overall WFL for tasks assessed      Cervical / Trunk Assessment: Normal  Communication   Communication: No difficulties  Cognition Arousal/Alertness: Awake/alert Behavior During Therapy: WFL for tasks assessed/performed Overall Cognitive Status: Within Functional Limits for tasks assessed                      General Comments      Exercises  Assessment/Plan    PT Assessment Patent does not need any further PT services  PT Diagnosis Difficulty walking   PT Problem List    PT Treatment Interventions     PT Goals (Current goals can be found in the Care Plan section) Acute Rehab PT Goals Patient Stated Goal: to get home to son who has a CT scan today PT Goal Formulation: All assessment and education complete, DC therapy    Frequency     Barriers to discharge        Co-evaluation               End of Session Equipment Utilized During Treatment: Gait belt Activity Tolerance: Patient tolerated  treatment well Patient left: in bed;with call bell/phone within reach Nurse Communication: Mobility status         Time: 8006-3494 PT Time Calculation (min) (ACUTE ONLY): 27 min   Charges:   PT Evaluation $PT Eval Low Complexity: 1 Procedure PT Treatments $Gait Training: 8-22 mins   PT G CodesRamond Dial 11-01-2015, 11:07 AM    Mee Hives, PT MS Acute Rehab Dept. Number: [place]

## 2015-10-02 NOTE — Progress Notes (Signed)
Pt discharged home with family. Discharge instructions given and all questions answered.

## 2015-10-02 NOTE — Discharge Summary (Addendum)
Discharge Summary  [name] PFX:[id number] DOB: [birth year]  PCP: Jani Gravel, MD  Admit date: 09/27/2015 Discharge date: 10/02/2015  Time spent: <85mns  Recommendations for Outpatient Follow-up:  1. F/u with PMD within a week  for hospital discharge follow up, repeat cbc/bmp at follow up 2. G/u with benign Hematology Dr GBeryle Beamsin two weeks  Discharge Diagnoses:  Active Hospital Problems   Diagnosis Date Noted  . Cellulitis 09/27/2015    Resolved Hospital Problems   Diagnosis Date Noted Date Resolved  No resolved problems to display.    Discharge Condition: stable  Diet recommendation: heart healthy/carb modified  Filed Weights   09/29/15 0501 09/30/15 0219 10/02/15 0553  Weight: 75.7 kg (166 lb 12.8 oz) 79 kg (174 lb 1.6 oz) 75.2 kg (165 lb 11.2 oz)    History of present illness:  [name] is a 75y.o. male, w dm2  Has c/o redness left distal lower ext,  Pt presented to PMD office after having been taking keflex without benefit that was given to him by ED.  Pt will be admitted for cellulitis.     Hospital Course:  Active Problems:   Cellulitis   Cellulitis, left lower extremity, failed outpatient oral abx with keflex Patient reported he noticed leg lateral leg pain at night one week prior to hospitalization, then his leg became red and swollen, he was treated with keflex without improvement, pmd sent him to the hospital for IV abx, he denies injury or bug bite to left leg.  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 He has been on coumadin since the late 90's. 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  a1c 6.7   Code Status :  FULL CODE  Family Communication  : wife in room  Disposition Plan  :  discharge home on Monday 8/14, with doxycycline, with home health RN for wound care.  Barriers For Discharge : significant cellulitis  Consults  :  none  Procedures  : none  DVT Prophylaxis  :  coumadin  Recent Labs       Lab Results  Component Value Date   PLT 59 (L) 10/01/2015      Antibiotics  :              Anti-infectives    Start     Dose/Rate Route  Frequency Ordered Stop   09/28/15 2000  vancomycin (VANCOCIN) 1,250 mg in sodium chloride 0.9 % 250 mL IVPB     1,250 mg 166.7 mL/hr over 90 Minutes Intravenous Every 24 hours 09/27/15 1912     09/28/15 0200  piperacillin-tazobactam (ZOSYN) IVPB 3.375 g     3.375 g 12.5 mL/hr over 240 Minutes Intravenous Every 8 hours 09/27/15 1842     09/27/15 1845  piperacillin-tazobactam (ZOSYN) IVPB 3.375 g     3.375 g 100 mL/hr over 30 Minutes Intravenous  Once 09/27/15 1841 09/27/15 2131   09/27/15 1845  vancomycin (VANCOCIN) 1,500 mg in sodium chloride 0.9 % 500 mL IVPB     1,500 mg 250 mL/hr over 120 Minutes Intravenous  Once  09/27/15 1843 09/27/15 2301      Discharge Exam: BP (!) 129/55 (BP Location: Right Arm)   Pulse 62   Temp 98 F (36.7 C)   Resp 18   Ht 5' 10"  (1.778 m)   Wt 75.2 kg (165 lb 11.2 oz)   SpO2 97%   BMI 23.78 kg/m    Awake Alert, Oriented X 3, No new F.N deficits, Normal affect Lyman.AT,PERRAL Supple Neck,No JVD, No cervical lymphadenopathy appriciated.  Symmetrical Chest wall movement, Good air movement bilaterally, CTAB RRR,No Gallops,Rubs or new Murmurs, No Parasternal Heave +ve B.Sounds, Abd Soft, No tenderness, No organomegaly appriciated, No rebound - guarding or rigidity. No Cyanosis, Clubbing or edema,  Left leg:  Erythema continue to subside, bulla on left lateral leg has popped, now with a central eschar about 1cm in diameter, no active drainage. No new lesions at discharge.   Discharge Instructions You were cared for by a hospitalist during your hospital stay. If you have any questions about your discharge medications or the care you received while you were in the hospital after you are discharged, you can call the unit and asked to speak with the hospitalist on call if the hospitalist that took care of you is not available. Once you are discharged, your primary care physician will handle any further medical issues. Please note that NO REFILLS for any discharge medications will be authorized once you are discharged, as it is imperative that you return to your primary care physician (or establish a relationship with a primary care physician if you do not have one) for your aftercare needs so that they can reassess your need for medications and monitor your lab values.  Discharge Instructions    Diet - low sodium heart healthy    Complete by:  As directed   Carb modified diet   Discharge instructions    Complete by:  As directed   Please have your INR checked more frequently , every three days while your are taking antibiotics Further coumadin dosage adjustment per coumadin  clinic direction.   Face-to-face encounter (required for Medicare/Medicaid patients)    Complete by:  As directed   I Patricia Perales certify that this patient is under my care and that I, or a nurse practitioner or physician's assistant working with me, had a face-to-face encounter that meets the physician face-to-face encounter requirements with this patient on 10/02/2015. The encounter with the patient was in whole, or in part for the following medical condition(s) which is the primary reason for home health care (List medical condition): FTT, extensive left lower leg cellulitis/wound RN for wound care:  topical antimicrobial dressing twice daily (xeroform gauze)   The encounter with the patient was in whole, or in part, for the following  medical condition, which is the primary reason for home health care:  left leg wound   I certify that, based on my findings, the following services are medically necessary home health services:  Nursing   Reason for Medically Necessary Home Health Services:  Skilled Nursing- Change/Decline in Patient Status   My clinical findings support the need for the above services:  OTHER SEE COMMENTS   Further, I certify that my clinical findings support that this patient is homebound due to:  Unsafe ambulation due to balance issues   Home Health    Complete by:  As directed   To provide the following care/treatments:  RN   Increase activity slowly    Complete by:  As directed       Medication List    STOP taking these medications   cephALEXin 500 MG capsule Commonly known as:  KEFLEX     TAKE these medications   atorvastatin 40 MG tablet Commonly known as:  LIPITOR Take 40 mg by mouth every evening.   doxycycline 100 MG capsule Commonly known as:  VIBRAMYCIN Take 1 capsule (100 mg total) by mouth 2 (two) times daily.   feeding supplement (GLUCERNA SHAKE) Liqd Take 237 mLs by mouth 2 (two) times daily between meals.   ferrous sulfate 325 (65 FE) MG EC  tablet Take 1 tablet (325 mg total) by mouth 2 (two) times daily with a meal.   glipiZIDE 10 MG tablet Commonly known as:  GLUCOTROL Take 10 mg by mouth 2 (two) times daily before a meal.   losartan 50 MG tablet Commonly known as:  COZAAR Take 50 mg by mouth daily.   metFORMIN 500 MG tablet Commonly known as:  GLUCOPHAGE Take 500 mg by mouth 2 (two) times daily with a meal.   ONGLYZA 5 MG Tabs tablet Generic drug:  saxagliptin HCl Take 5 mg by mouth every evening.   warfarin 2 MG tablet Commonly known as:  COUMADIN Take 2 mg by mouth 2 (two) times daily.      No Known Allergies Follow-up Information    Jani Gravel, MD Follow up in 1 week(s).   Specialty:  Internal Medicine Why:  cellulitis Contact information: [place] [address] [phone]        Annia Belt, MD Follow up in 2 week(s).   Specialty:  Oncology Why:  for low platelet Contact information: [place] [address] [phone]        coumadin clinic .   Why:  to have INR checked every three days while on oral abx, further coumadin dosage adjustment per coumadin clinic.           The results of significant diagnostics from this hospitalization (including imaging, microbiology, ancillary and laboratory) are listed below for reference.    Significant Diagnostic Studies: Dg Tibia/fibula Left  Result Date: 09/30/2015 CLINICAL DATA:  75 year old male with cellulitis in the left lower extremity with pain. Initial encounter. EXAM: LEFT TIBIA AND FIBULA - 2 VIEW COMPARISON:  Lower extremity ultrasound from today reported separately. FINDINGS: Bone mineralization is within normal limits for age. Alignment at the left knee and ankle is preserved. Left tibia and fibula intact. No acute osseous abnormality identified. Widespread subcutaneous stranding compatible with inflammation. No subcutaneous gas. Mild calcified peripheral vascular disease. No  radiopaque foreign body identified. IMPRESSION: Soft tissue swelling and stranding. No subcutaneous gas or acute osseous abnormality identified about the left tib-fib. Electronically Signed   By:  Genevie Ann M.D.   On: 09/30/2015 16:42   [place] Extrem Low Left Ltd  Result Date: 09/30/2015 CLINICAL DATA:  Cellulitis, redness that distal LEFT leg for 3 days EXAM: ULTRASOUND LEFT LOWER EXTREMITY LIMITED TECHNIQUE: Ultrasound examination of the lower extremity soft tissues was performed in the area of clinical concern. COMPARISON:  None FINDINGS: Subcutaneous edema at the site of clinical concern with mild skin thickening. No focal fluid collection identified to suggest abscess. No calcification or solid mass. IMPRESSION: Subcutaneous edema without focal mass or fluid collection. Electronically Signed   By: Lavonia Dana M.D.   On: 09/30/2015 16:36    Microbiology: Recent Results (from the past 240 hour(s))  MRSA PCR Screening     Status: None   Collection Time: 09/30/15 10:05 AM  Result Value Ref Range Status   MRSA by PCR NEGATIVE NEGATIVE Final    Comment:        The GeneXpert MRSA Assay (FDA approved for NASAL specimens only), is one component of a comprehensive MRSA colonization surveillance program. It is not intended to diagnose MRSA infection nor to guide or monitor treatment for MRSA infections.      Labs: Basic Metabolic Panel:  Recent Labs Lab 09/27/15 1900 09/29/15 0701 09/30/15 0750 10/01/15 0510 10/02/15 0722  NA 138 142 141 142 140  K 4.3 4.2 4.2 4.4 4.0  CL 109 109 109 108 111  CO2 22 25 24 24 24   GLUCOSE 122* 80 115* 103* 126*  BUN 18 16 16 15 14   CREATININE 1.12 1.21 1.22 1.11 1.23  CALCIUM 8.4* 8.0* 8.2* 8.5* 8.4*  MG  --   --   --  1.9  --    Liver Function Tests:  Recent Labs Lab 09/27/15 1900 09/30/15 0750  AST 24 25  ALT 19 18  ALKPHOS 49 42  BILITOT 0.8 0.7  PROT 5.7* 5.3*  ALBUMIN 2.6* 2.3*   No results for input(s): LIPASE, AMYLASE in the last  168 hours. No results for input(s): AMMONIA in the last 168 hours. CBC:  Recent Labs Lab 09/27/15 1900 09/30/15 0750 10/01/15 0510 10/02/15 0722  WBC 4.2 3.3* 3.4* 4.2  HGB 8.9* 9.1* 8.8* 9.1*  HCT 27.3* 28.1* 26.6* 28.3*  MCV 93.5 94.0 92.7 95.3  PLT 48* 61* 59* 59*   Cardiac Enzymes: No results for input(s): CKTOTAL, CKMB, CKMBINDEX, TROPONINI in the last 168 hours. BNP: BNP (last 3 results) No results for input(s): BNP in the last 8760 hours.  ProBNP (last 3 results) No results for input(s): PROBNP in the last 8760 hours.  CBG:  Recent Labs Lab 10/01/15 0726 10/01/15 1141 10/01/15 1625 10/01/15 2127 10/02/15 0814  GLUCAP 104* 197* 186* 179* 118*       Signed:  Shalaine Payson MD, PhD  [facility] 10/02/2015, 10:16 AM

## 2015-10-03 ENCOUNTER — Encounter: Payer: Self-pay | Admitting: Internal Medicine

## 2015-10-03 DIAGNOSIS — Z86718 Personal history of other venous thrombosis and embolism: Secondary | ICD-10-CM | POA: Diagnosis not present

## 2015-10-03 DIAGNOSIS — L03116 Cellulitis of left lower limb: Secondary | ICD-10-CM | POA: Diagnosis not present

## 2015-10-03 DIAGNOSIS — Z7901 Long term (current) use of anticoagulants: Secondary | ICD-10-CM | POA: Diagnosis not present

## 2015-10-05 DIAGNOSIS — T148 Other injury of unspecified body region: Secondary | ICD-10-CM | POA: Diagnosis not present

## 2015-10-06 ENCOUNTER — Other Ambulatory Visit: Payer: Self-pay | Admitting: Oncology

## 2015-10-06 DIAGNOSIS — D72819 Decreased white blood cell count, unspecified: Secondary | ICD-10-CM

## 2015-10-06 DIAGNOSIS — D693 Immune thrombocytopenic purpura: Secondary | ICD-10-CM

## 2015-10-07 DIAGNOSIS — K7460 Unspecified cirrhosis of liver: Secondary | ICD-10-CM | POA: Diagnosis not present

## 2015-10-07 DIAGNOSIS — D693 Immune thrombocytopenic purpura: Secondary | ICD-10-CM | POA: Diagnosis not present

## 2015-10-07 DIAGNOSIS — Z792 Long term (current) use of antibiotics: Secondary | ICD-10-CM | POA: Diagnosis not present

## 2015-10-07 DIAGNOSIS — L03116 Cellulitis of left lower limb: Secondary | ICD-10-CM | POA: Diagnosis not present

## 2015-10-07 DIAGNOSIS — I10 Essential (primary) hypertension: Secondary | ICD-10-CM | POA: Diagnosis not present

## 2015-10-07 DIAGNOSIS — R238 Other skin changes: Secondary | ICD-10-CM | POA: Diagnosis not present

## 2015-10-07 DIAGNOSIS — Z5181 Encounter for therapeutic drug level monitoring: Secondary | ICD-10-CM | POA: Diagnosis not present

## 2015-10-07 DIAGNOSIS — D649 Anemia, unspecified: Secondary | ICD-10-CM | POA: Diagnosis not present

## 2015-10-07 DIAGNOSIS — Z7984 Long term (current) use of oral hypoglycemic drugs: Secondary | ICD-10-CM | POA: Diagnosis not present

## 2015-10-07 DIAGNOSIS — Z79891 Long term (current) use of opiate analgesic: Secondary | ICD-10-CM | POA: Diagnosis not present

## 2015-10-07 DIAGNOSIS — E119 Type 2 diabetes mellitus without complications: Secondary | ICD-10-CM | POA: Diagnosis not present

## 2015-10-07 DIAGNOSIS — Z7901 Long term (current) use of anticoagulants: Secondary | ICD-10-CM | POA: Diagnosis not present

## 2015-10-09 ENCOUNTER — Telehealth: Payer: Self-pay

## 2015-10-09 NOTE — Telephone Encounter (Signed)
Spoke with Tamela Oddi at Dr. Azucena Freed office to schedule follow-up for patient to see Hematology. Doris states Dr. Beryle Beams has reviewed patient's chart and will work him in this month to be seen in follow-up. Doris states she will call the patient directly to schedule appt.

## 2015-10-10 DIAGNOSIS — Z5181 Encounter for therapeutic drug level monitoring: Secondary | ICD-10-CM | POA: Diagnosis not present

## 2015-10-10 DIAGNOSIS — Z792 Long term (current) use of antibiotics: Secondary | ICD-10-CM | POA: Diagnosis not present

## 2015-10-10 DIAGNOSIS — Z7901 Long term (current) use of anticoagulants: Secondary | ICD-10-CM | POA: Diagnosis not present

## 2015-10-10 DIAGNOSIS — Z7984 Long term (current) use of oral hypoglycemic drugs: Secondary | ICD-10-CM | POA: Diagnosis not present

## 2015-10-10 DIAGNOSIS — D649 Anemia, unspecified: Secondary | ICD-10-CM | POA: Diagnosis not present

## 2015-10-10 DIAGNOSIS — I10 Essential (primary) hypertension: Secondary | ICD-10-CM | POA: Diagnosis not present

## 2015-10-10 DIAGNOSIS — R238 Other skin changes: Secondary | ICD-10-CM | POA: Diagnosis not present

## 2015-10-10 DIAGNOSIS — D693 Immune thrombocytopenic purpura: Secondary | ICD-10-CM | POA: Diagnosis not present

## 2015-10-10 DIAGNOSIS — K7460 Unspecified cirrhosis of liver: Secondary | ICD-10-CM | POA: Diagnosis not present

## 2015-10-10 DIAGNOSIS — Z79891 Long term (current) use of opiate analgesic: Secondary | ICD-10-CM | POA: Diagnosis not present

## 2015-10-10 DIAGNOSIS — L03116 Cellulitis of left lower limb: Secondary | ICD-10-CM | POA: Diagnosis not present

## 2015-10-10 DIAGNOSIS — E119 Type 2 diabetes mellitus without complications: Secondary | ICD-10-CM | POA: Diagnosis not present

## 2015-10-12 DIAGNOSIS — T148 Other injury of unspecified body region: Secondary | ICD-10-CM | POA: Diagnosis not present

## 2015-10-13 DIAGNOSIS — Z7901 Long term (current) use of anticoagulants: Secondary | ICD-10-CM | POA: Diagnosis not present

## 2015-10-13 DIAGNOSIS — Z5181 Encounter for therapeutic drug level monitoring: Secondary | ICD-10-CM | POA: Diagnosis not present

## 2015-10-13 DIAGNOSIS — Z7984 Long term (current) use of oral hypoglycemic drugs: Secondary | ICD-10-CM | POA: Diagnosis not present

## 2015-10-13 DIAGNOSIS — R238 Other skin changes: Secondary | ICD-10-CM | POA: Diagnosis not present

## 2015-10-13 DIAGNOSIS — Z792 Long term (current) use of antibiotics: Secondary | ICD-10-CM | POA: Diagnosis not present

## 2015-10-13 DIAGNOSIS — L03116 Cellulitis of left lower limb: Secondary | ICD-10-CM | POA: Diagnosis not present

## 2015-10-13 DIAGNOSIS — K7460 Unspecified cirrhosis of liver: Secondary | ICD-10-CM | POA: Diagnosis not present

## 2015-10-13 DIAGNOSIS — D649 Anemia, unspecified: Secondary | ICD-10-CM | POA: Diagnosis not present

## 2015-10-13 DIAGNOSIS — I10 Essential (primary) hypertension: Secondary | ICD-10-CM | POA: Diagnosis not present

## 2015-10-13 DIAGNOSIS — Z79891 Long term (current) use of opiate analgesic: Secondary | ICD-10-CM | POA: Diagnosis not present

## 2015-10-13 DIAGNOSIS — D693 Immune thrombocytopenic purpura: Secondary | ICD-10-CM | POA: Diagnosis not present

## 2015-10-13 DIAGNOSIS — E119 Type 2 diabetes mellitus without complications: Secondary | ICD-10-CM | POA: Diagnosis not present

## 2015-10-16 ENCOUNTER — Encounter: Payer: Medicare Other | Attending: Surgery | Admitting: Surgery

## 2015-10-16 DIAGNOSIS — Z7901 Long term (current) use of anticoagulants: Secondary | ICD-10-CM | POA: Insufficient documentation

## 2015-10-16 DIAGNOSIS — B9562 Methicillin resistant Staphylococcus aureus infection as the cause of diseases classified elsewhere: Secondary | ICD-10-CM | POA: Diagnosis not present

## 2015-10-16 DIAGNOSIS — I6523 Occlusion and stenosis of bilateral carotid arteries: Secondary | ICD-10-CM | POA: Diagnosis not present

## 2015-10-16 DIAGNOSIS — Z7984 Long term (current) use of oral hypoglycemic drugs: Secondary | ICD-10-CM | POA: Insufficient documentation

## 2015-10-16 DIAGNOSIS — L97222 Non-pressure chronic ulcer of left calf with fat layer exposed: Secondary | ICD-10-CM | POA: Insufficient documentation

## 2015-10-16 DIAGNOSIS — Z86718 Personal history of other venous thrombosis and embolism: Secondary | ICD-10-CM | POA: Insufficient documentation

## 2015-10-16 DIAGNOSIS — I10 Essential (primary) hypertension: Secondary | ICD-10-CM | POA: Diagnosis not present

## 2015-10-16 DIAGNOSIS — D649 Anemia, unspecified: Secondary | ICD-10-CM | POA: Insufficient documentation

## 2015-10-16 DIAGNOSIS — Z79899 Other long term (current) drug therapy: Secondary | ICD-10-CM | POA: Insufficient documentation

## 2015-10-16 DIAGNOSIS — E785 Hyperlipidemia, unspecified: Secondary | ICD-10-CM | POA: Diagnosis not present

## 2015-10-16 DIAGNOSIS — M1990 Unspecified osteoarthritis, unspecified site: Secondary | ICD-10-CM | POA: Diagnosis not present

## 2015-10-16 DIAGNOSIS — L03116 Cellulitis of left lower limb: Secondary | ICD-10-CM | POA: Diagnosis not present

## 2015-10-16 DIAGNOSIS — I890 Lymphedema, not elsewhere classified: Secondary | ICD-10-CM | POA: Insufficient documentation

## 2015-10-16 DIAGNOSIS — E11622 Type 2 diabetes mellitus with other skin ulcer: Secondary | ICD-10-CM | POA: Insufficient documentation

## 2015-10-16 DIAGNOSIS — Z8673 Personal history of transient ischemic attack (TIA), and cerebral infarction without residual deficits: Secondary | ICD-10-CM | POA: Diagnosis not present

## 2015-10-16 DIAGNOSIS — L97821 Non-pressure chronic ulcer of other part of left lower leg limited to breakdown of skin: Secondary | ICD-10-CM | POA: Diagnosis not present

## 2015-10-16 NOTE — Progress Notes (Addendum)
[name] ([id number]) Visit Report for 10/16/2015 Chief Complaint Document Details Patient Name: [name], [name] Date of Service: 10/16/2015 1:30 PM Medical Record Number: [id number] Patient Account Number: [id number] Date of Birth/Sex: [birth year] (75 y.o. Male) Treating RN: Ahmed Prima Primary Care Physician: Jani Gravel Other Clinician: Referring Physician: Jani Gravel Treating Physician/Extender: Frann Rider in Treatment: 0 Information Obtained from: Patient Chief Complaint Patients presents for treatment of an open diabetic ulcer left lower extremity with swelling, cellulitis and ulceration for about a month Electronic Signature(s) Signed: 10/16/2015 2:37:03 PM By: Christin Fudge MD, FACS Entered By: Christin Fudge on 10/16/2015 14:37:02 Shirley, [name] ([id number]) -------------------------------------------------------------------------------- Debridement Details Patient Name: [name] Date of Service: 10/16/2015 1:30 PM Medical Record Number: [id number] Patient Account Number: [id number] Date of Birth/Sex: Apr 22, 1940 (75 y.o. Male) Treating RN: Ahmed Prima Primary Care Physician: Jani Gravel Other Clinician: Referring Physician: Jani Gravel Treating Physician/Extender: Frann Rider in Treatment: 0 Debridement Performed for Wound #1 Anterior Lower Leg Assessment: Performed By: Physician Christin Fudge, MD Debridement: Debridement Pre-procedure Yes - 14:24 Verification/Time Out Taken: Start Time: 14:24 Pain Control: Other : lidocaine 4% cream Level: Skin/Subcutaneous Tissue Total Area Debrided (L x 2.1 (cm) x 1.5 (cm) = 3.15 (cm) W): Tissue and other Viable, Non-Viable, Exudate, Fibrin/Slough, Subcutaneous material debrided: Instrument: Curette Bleeding: Minimum Hemostasis Achieved: Pressure End Time: 14:27 Procedural Pain: 0 Post Procedural Pain: 0 Response to Treatment: Procedure was tolerated well Post Debridement  Measurements of Total Wound Length: (cm) 2 Width: (cm) 1.5 Depth: (cm) 0.3 Volume: (cm) 0.707 Character of Wound/Ulcer Post Requires Further Debridement Debridement: Severity of Tissue Post Debridement: Limited to breakdown of skin Post Procedure Diagnosis Same as Pre-procedure Notes undermining-12 o'clock to 12 o'clock 1.4cm Electronic Signature(s) Signed: 10/16/2015 2:34:55 PM By: Christin Fudge MD, FACS Signed: 10/16/2015 4:32:54 PM By: [name], [place] ([id number]) Entered By: Christin Fudge on 10/16/2015 14:34:55 [place], Kidus RMarland Kitchen ([id number]) -------------------------------------------------------------------------------- HPI Details Patient Name: [name] Date of Service: 10/16/2015 1:30 PM Medical Record Number: [id number] Patient Account Number: [id number] Date of Birth/Sex: 09/13/1940 (75 y.o. Male) Treating RN: Ahmed Prima Primary Care Physician: Jani Gravel Other Clinician: Referring Physician: Jani Gravel Treating Physician/Extender: Frann Rider in Treatment: 0 History of Present Illness Location: left lower extremity Quality: Patient reports experiencing a dull pain to affected area(s). Severity: Patient states wound are getting worse. Duration: Patient has had the wound for < 4 weeks prior to presenting for treatment Timing: Pain in wound is Intermittent (comes and goes Context: The wound appeared gradually over time Modifying Factors: Other treatment(s) tried include:hospital admission with IV antibiotics and workup Associated Signs and Symptoms: Patient reports having increase swelling. HPI Description: 75 year old gentleman who was been having his primary care taken care of by Dr. Maudie Mercury at [facility] has had recurrent cellulitis the left lower extremity and so far has had 3 attacks. First started in August of this year and was admitted to the ER and sent home with Keflex and. He saw his primary care doctor  on August 9 and was referred to [facility] where they began intravenous antibiotics and was admitted to August 15. He at present on doxycycline 100 mg twice a day. during his inpatient stay he was prepped with a DVT study which was negative and was treated with vancomycin and Zosyn and discharged home onoral doxycycline for 10 days medical history significant for diabetes mellitus type 2 is controlled by diet, hypertension, hyperlipidemia, bilateral carotid stenosis, mild mitral regurgitation,  thrombocytopenia, superior mesenteric wait and portal vein thrombosis, colitis of the left knee, actinic keratosis, right popliteal DVT, right colon AVM,. Never been a smoker. During the admission he had a ultrasound done to look for a abscess and none was found except for subcutaneous edema and there was no focal muscle fluid collection. Xray of the left lower extremity --IMPRESSION:Soft tissue swelling and stranding. No subcutaneous gas or acute osseous abnormality identified about the left tib-fib. oOf note there was a question of Coumadin induced skin necrosis and has been on Coumadin since the late 90's. Last hemoglobin A1c was 6.7 Electronic Signature(s) Signed: 10/16/2015 2:44:53 PM By: Christin Fudge MD, FACS Previous Signature: 10/16/2015 2:38:10 PM Version By: Christin Fudge MD, FACS Previous Signature: 10/16/2015 1:01:27 PM Version By: Christin Fudge MD, FACS Entered By: Christin Fudge on 10/16/2015 14:44:53 Fringer, [name] ([id number]) -------------------------------------------------------------------------------- Physical Exam Details Patient Name: [name] Date of Service: 10/16/2015 1:30 PM Medical Record Number: [id number] Patient Account Number: [id number] Date of Birth/Sex: 16-Dec-1940 (75 y.o. Male) Treating RN: Ahmed Prima Primary Care Physician: Jani Gravel Other Clinician: Referring Physician: Jani Gravel Treating Physician/Extender: Frann Rider in Treatment:  0 Constitutional . Pulse regular. Respirations normal and unlabored. Afebrile. . Eyes Nonicteric. Reactive to light. Ears, Nose, Mouth, and Throat Lips, teeth, and gums WNL.[name] Moist mucosa without lesions. Neck supple and nontender. No palpable supraclavicular or cervical adenopathy. Normal sized without goiter. Respiratory WNL. No retractions.. . Cardiovascular . Pedal Pulses WNL. ABIs were noncompressibleABI was noncompressible. bilateral stage I lymphedema. Chest Breasts symmetical and no nipple discharge.. Breast tissue WNL, no masses, lumps, or tenderness.. Gastrointestinal (GI) Abdomen without masses or tenderness.. No liver or spleen enlargement or tenderness.. Lymphatic No adneopathy. No adenopathy. No adenopathy. Musculoskeletal Adexa without tenderness or enlargement.. Digits and nails w/o clubbing, cyanosis, infection, petechiae, ischemia, or inflammatory conditions.. Integumentary (Hair, Skin) No suspicious lesions. No crepitus or fluctuance. No peri-wound warmth or erythema. No masses.[name] Psychiatric Judgement and insight Intact.. No evidence of depression, anxiety, or agitation.. Notes he had a necrotic ulceration on the left lower extremity lateral calf area which was sharply debrided with a #3 curet down to the subcutaneous tissue and there was significant amount of undermining about a centimeter and a half all around. No evidence of pus or drainage.purulent drainage Electronic Signature(s) Signed: 10/16/2015 2:46:22 PM By: Christin Fudge MD, FACS Entered By: Christin Fudge on 10/16/2015 14:46:21 Stanczyk, ISAAC DUBIE ([id number]) OTTAVIO, NOREM ([id number]) -------------------------------------------------------------------------------- Physician Orders Details Patient Name: [name] Date of Service: 10/16/2015 1:30 PM Medical Record Number: [id number] Patient Account Number: [id number] Date of Birth/Sex: Aug 21, 1940 (75 y.o. Male) Treating RN: Ahmed Prima Primary Care Physician: Jani Gravel Other Clinician: Referring Physician: Jani Gravel Treating Physician/Extender: Frann Rider in Treatment: 0 Verbal / Phone Orders: Yes Clinician: Pinkerton, Debi Read Back and Verified: Yes Diagnosis Coding Wound Cleansing Wound #1 Anterior Lower Leg o Clean wound with wound cleanser. o Cleanse wound with mild soap and water o May Shower, gently pat wound dry prior to applying new dressing. Anesthetic Wound #1 Anterior Lower Leg o Topical Lidocaine 4% cream applied to wound bed prior to debridement Primary Wound Dressing Wound #1 Anterior Lower Leg o Santyl Ointment o Plain packing gauze - pack into the undermining of wound lightly Secondary Dressing Wound #1 Anterior Lower Leg o Contact Layer o Dry Gauze o Conform/Kerlix - stretch netting #4, tape Dressing Change Frequency Wound #1 Anterior Lower Leg o Change dressing every day. Follow-up Appointments Wound #1  Anterior Lower Leg o Return Appointment in 1 week. Edema Control Wound #1 Anterior Lower Leg o Elevate legs to the level of the heart and pump ankles as often as possible Additional Orders / Instructions Wound #1 Anterior Lower Leg Winchell, Raymone R. ([id number]) o Increase protein intake. Home Health Wound #1 Anterior Lower Leg o [place] Visits - [place] Nurse may visit PRN to address patientos wound care needs. o FACE TO FACE ENCOUNTER: MEDICARE and MEDICAID PATIENTS: I certify that this patient is under my care and that I had a face-to-face encounter that meets the physician face-to-face encounter requirements with this patient on this date. The encounter with the patient was in whole or in part for the following MEDICAL CONDITION: (primary reason for [place]) MEDICAL NECESSITY: I certify, that based on my findings, NURSING services are a medically necessary home health service. HOME BOUND STATUS:  I certify that my clinical findings support that this patient is homebound (i.e., Due to illness or injury, pt requires aid of supportive devices such as crutches, cane, wheelchairs, walkers, the use of special transportation or the assistance of another person to leave their place of residence. There is a normal inability to leave the home and doing so requires considerable and taxing effort. Other absences are for medical reasons / religious services and are infrequent or of short duration when for other reasons). o If current dressing causes regression in wound condition, may D/C ordered dressing product/s and apply Normal Saline Moist Dressing daily until next [place] / Other MD appointment. [place] of regression in wound condition at [phone]. o Please direct any NON-WOUND related issues/requests for orders to patient's Primary Care Physician Medications-please add to medication list. Wound #1 Anterior Lower Leg o Santyl Enzymatic Ointment Services and Therapies o Arterial Studies- Bilateral - [facility] o Venous Duplex Doppler - [facility] Electronic Signature(s) Signed: 10/16/2015 4:16:41 PM By: Christin Fudge MD, FACS Signed: 10/16/2015 4:32:54 PM By: Alric Quan Previous Signature: 10/16/2015 3:52:04 PM Version By: Christin Fudge MD, FACS Previous Signature: 10/16/2015 2:36:13 PM Version By: Christin Fudge MD, FACS Entered By: Alric Quan on 10/16/2015 15:58:16 Habeeb, Vernice RMarland Kitchen ([id number]) -------------------------------------------------------------------------------- Problem List Details Patient Name: [name] Date of Service: 10/16/2015 1:30 PM Medical Record Number: [id number] Patient Account Number: [id number] Date of Birth/Sex: [birth year] (75 y.o. 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 I89.0 Lymphedema, not elsewhere classified 10/16/2015 Yes Inactive Problems Resolved Problems Electronic Signature(s) Signed: 10/16/2015 2:36:32 PM By: Christin Fudge MD, FACS Previous Signature: 10/16/2015 2:34:36 PM Version By: Christin Fudge MD, FACS Entered By: Christin Fudge on 10/16/2015 14:36:31 Fairchance, Leshaun RMarland Kitchen ([id number]) -------------------------------------------------------------------------------- Progress Note Details Patient Name: [name] Date of Service: 10/16/2015 1:30 PM Medical Record Number: [id number] Patient Account Number: [id number] Date of Birth/Sex: 1940/04/10 (75 y.o. Male) Treating RN: Ahmed Prima Primary Care Physician: Jani Gravel Other Clinician: Referring Physician: Jani Gravel Treating Physician/Extender: Frann Rider in Treatment: 0 Subjective Chief Complaint Information obtained from Patient Patients presents for treatment of an open diabetic ulcer left lower extremity with swelling, cellulitis and ulceration for about a month History of Present Illness (HPI) The following HPI elements were  documented for the patient's wound: Location: left lower extremity Quality: Patient reports experiencing a dull pain to affected area(s). Severity: Patient states wound are getting worse. Duration: Patient has had the wound for < 4 weeks prior to presenting for treatment Timing: Pain in wound is Intermittent (comes and goes Context: The wound appeared gradually over time Modifying Factors: Other treatment(s) tried include:hospital admission with IV antibiotics and workup Associated Signs and Symptoms: Patient reports having increase swelling. 75 year old gentleman who was been having his  primary care taken care of by Dr. Maudie Mercury at [facility] has had recurrent cellulitis the left lower extremity and so far has had 3 attacks. First started in August of this year and was admitted to the ER and sent home with Keflex and. He saw his primary care doctor on August 9 and was referred to [facility] where they began intravenous antibiotics and was admitted to August 15. He at present on doxycycline 100 mg twice a day. 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 Never been a smoker. During the admission he had a ultrasound done to look for a abscess and none was found except for subcutaneous edema and there was no focal muscle fluid collection. Xray of the left lower extremity --IMPRESSION:Soft tissue swelling and stranding. No subcutaneous gas or acute osseous abnormality identified about the left tib-fib. Of note there was a question of Coumadin induced skin necrosis and has been on Coumadin since the late 90's. Last hemoglobin A1c was 6.7 Wound History Patient presents with 1 open wound that has been present for approximately 3 weeks. Patient has been [place], Miles R. ([id number]) treating wound in the following manner: xeroform and cleaning with saline. Laboratory tests have not been performed in the last month. Patient reportedly has not tested positive for an antibiotic resistant organism. Patient reportedly has not tested positive for osteomyelitis. Patient reportedly has not had testing performed to evaluate circulation in  the legs. Patient experiences the following problems associated with their wounds: infection, swelling. Patient History Information obtained from Patient. Allergies NKDA Family History Cancer - Siblings, Child, Diabetes - Siblings, Heart Disease - Mother, No family history of Hereditary Spherocytosis, Hypertension, Kidney Disease, Lung Disease, Seizures, Stroke, Thyroid Problems, Tuberculosis. Social History Never smoker, Marital Status - Married, Alcohol Use - Never, Drug Use - No History, Caffeine Use - Daily. Medical History Eyes Patient has history of Cataracts - surgery Hematologic/Lymphatic Patient has history of Anemia Cardiovascular Patient has history of Hypertension Endocrine Patient has history of Type II Diabetes Musculoskeletal Patient has history of Osteoarthritis Patient is treated with Oral Agents. Blood sugar is tested. Review of Systems (ROS) Constitutional Symptoms (General Health) The patient has no complaints or symptoms. Ear/Nose/Mouth/Throat HOH Respiratory The patient has no complaints or symptoms. Cardiovascular mild mitral regurgitation CVA hyperlipidemia Gastrointestinal The patient has no complaints or symptoms. Genitourinary The patient has no complaints or symptoms. Immunological The patient has no complaints or symptoms. Integumentary (Skin) Modi, Absalom R. ([id number]) Complains or has symptoms of Wounds. Neurologic The patient has no complaints or symptoms. Oncologic The patient has no complaints or symptoms. Psychiatric The patient has no complaints  or symptoms. Medications warfarin 2 mg tablet oral 1 1 tablet oral two times daily metformin 500 mg tablet oral 1 1 tablet oral two times daily Onglyza 5 mg tablet oral 1 1 tablet oral daily glipizide 10 mg tablet oral 2 2 tablet oral two times daily atorvastatin 40 mg tablet oral 1 1 tablet oral daily losartan 50 mg tablet oral 1 1 tablet oral daily ferrous sulfate 325 mg (65 mg  iron) tablet oral 1 1 tablet oral two times daily doxycycline hyclate 100 mg tablet oral 1 1 tablet oral two times daily Santyl 250 unit/gram topical ointment topical ointment topical as directed Objective Constitutional Pulse regular. Respirations normal and unlabored. Afebrile. Vitals Time Taken: 1:41 PM, Height: 70 in, Source: Stated, Weight: 165.7 lbs, Source: Measured, BMI: 23.8, Temperature: 98.1 F, Pulse: 73 bpm, Respiratory Rate: 16 breaths/min, Blood Pressure: 157/57 mmHg. Eyes Nonicteric. Reactive to light. Ears, Nose, Mouth, and Throat Lips, teeth, and gums WNL.[name] Moist mucosa without lesions. Neck supple and nontender. No palpable supraclavicular or cervical adenopathy. Normal sized without goiter. Respiratory WNL. No retractions.. Cardiovascular Walczyk, Nowell R. ([id number]) Pedal Pulses WNL. ABIs were noncompressibleABI was noncompressible. bilateral stage I lymphedema. Chest Breasts symmetical and no nipple discharge.. Breast tissue WNL, no masses, lumps, or tenderness.. Gastrointestinal (GI) Abdomen without masses or tenderness.. No liver or spleen enlargement or tenderness.. Lymphatic No adneopathy. No adenopathy. No adenopathy. Musculoskeletal Adexa without tenderness or enlargement.. Digits and nails w/o clubbing, cyanosis, infection, petechiae, ischemia, or inflammatory conditions.[name] Psychiatric Judgement and insight Intact.. No evidence of depression, anxiety, or agitation.. General Notes: he had a necrotic ulceration on the left lower extremity lateral calf area which was sharply debrided with a #3 curet down to the subcutaneous tissue and there was significant amount of undermining about a centimeter and a half all around. No evidence of pus or drainage.purulent drainage Integumentary (Hair, Skin) No suspicious lesions. No crepitus or fluctuance. No peri-wound warmth or erythema. No masses.. Wound #1 status is Open. Original cause of wound was Gradually  Appeared. The wound is located on the Anterior Lower Leg. The wound measures 2.1cm length x 1.5cm width x 0.1cm depth; 2.474cm^2 area and 0.247cm^3 volume. The wound is limited to skin breakdown. There is no tunneling or undermining noted. There is a large amount of serosanguineous drainage noted. The wound margin is distinct with the outline attached to the wound base. There is no granulation within the wound bed. There is a large (67-100%) amount of necrotic tissue within the wound bed including Eschar and Adherent Slough. The periwound skin appearance exhibited: Localized Edema, Moist. Periwound temperature was noted as No Abnormality. The periwound has tenderness on palpation. Assessment Active Problems ICD-10 E11.622 - Type 2 diabetes mellitus with other skin ulcer L97.222 - Non-pressure chronic ulcer of left calf with fat layer exposed I89.0 - Lymphedema, not elsewhere classified JAXTYN, LINVILLE ([id number]) 75 year old gentleman with diabetes mellitus and a history of chronic DVT has been on Coumadin for a long while. He simply had a bad attack of cellulitis of the left lower extremity which has not involved with a DVT or any pus collection. I have recommended: 1. Santyl ointment locally to be placed with some packing gauze. 2. light compression stockings 20-30 mm of Hg to be worn all day 3. Venous duplex studies to look for reflux. 4. Arterial duplex study 5. regular visits to the wound center He and his wife have had all QUESTIONS answered and will be compliant Procedures Wound #1 Wound #1  is a Diabetic Wound/Ulcer of the Lower Extremity located on the Anterior Lower Leg . There was a Skin/Subcutaneous Tissue Debridement ([id number]) debridement with total area of 3.15 sq cm performed by Christin Fudge, MD. with the following instrument(s): Curette to remove Viable and Non-Viable tissue/material including Exudate, Fibrin/Slough, and Subcutaneous after achieving pain control  using Other (lidocaine 4% cream). A time out was conducted at 14:24, prior to the start of the procedure. A Minimum amount of bleeding was controlled with Pressure. The procedure was tolerated well with a pain level of 0 throughout and a pain level of 0 following the procedure. Post Debridement Measurements: 2cm length x 1.5cm width x 0.3cm depth; 0.707cm^3 volume. Character of Wound/Ulcer Post Debridement requires further debridement. Severity of Tissue Post Debridement is: Limited to breakdown of skin. Post procedure Diagnosis Wound #1: Same as Pre-Procedure General Notes: undermining-12 o'clock to 12 o'clock 1.4cm. Plan Wound Cleansing: Wound #1 Anterior Lower Leg: Clean wound with wound cleanser. Cleanse wound with mild soap and water May Shower, gently pat wound dry prior to applying new dressing. Anesthetic: Wound #1 Anterior Lower Leg: Topical Lidocaine 4% cream applied to wound bed prior to debridement Primary Wound Dressing: Wound #1 Anterior Lower Leg: Kauth, Adekunle R. ([id number]) Santyl Ointment Plain packing gauze - pack into the undermining of wound lightly Secondary Dressing: Wound #1 Anterior Lower Leg: Contact Layer Dry Gauze Conform/Kerlix - stretch netting #4, tape Dressing Change Frequency: Wound #1 Anterior Lower Leg: Change dressing every day. Follow-up Appointments: Wound #1 Anterior Lower Leg: Return Appointment in 1 week. Edema Control: Wound #1 Anterior Lower Leg: Elevate legs to the level of the heart and pump ankles as often as possible Additional Orders / Instructions: Wound #1 Anterior Lower Leg: Increase protein intake. Home Health: Wound #1 Anterior Lower Leg: Continue Home Health Visits - [place] Nurse may visit PRN to address patient s wound care needs. FACE TO FACE ENCOUNTER: MEDICARE and MEDICAID PATIENTS: I certify that this patient is under my care and that I had a face-to-face encounter that meets the physician  face-to-face encounter requirements with this patient on this date. The encounter with the patient was in whole or in part for the following MEDICAL CONDITION: (primary reason for [place]) MEDICAL NECESSITY: I certify, that based on my findings, NURSING services are a medically necessary home health service. HOME BOUND STATUS: I certify that my clinical findings support that this patient is homebound (i.e., Due to illness or injury, pt requires aid of supportive devices such as crutches, cane, wheelchairs, walkers, the use of special transportation or the assistance of another person to leave their place of residence. There is a normal inability to leave the home and doing so requires considerable and taxing effort. Other absences are for medical reasons / religious services and are infrequent or of short duration when for other reasons). If current dressing causes regression in wound condition, may D/C ordered dressing product/s and apply Normal Saline Moist Dressing daily until next [place] / Other MD appointment. Whitney of regression in wound condition at [phone]. 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 He simply had  a bad attack of cellulitis of the left lower extremity which has not involved with a DVT or any pus collection. I have recommended: 1. Santyl ointment locally to be placed with some packing gauze. 2. light compression stockings 20-30 mm of Hg to be worn all day 3. Venous duplex studies to look for reflux. 4.  Arterial duplex study 5. regular visits to the wound center He and his wife have had all QUESTIONS answered and will be compliant Electronic Signature(s) Signed: 10/16/2015 4:17:40 PM By: Christin Fudge MD, FACS Previous Signature: 10/16/2015 3:55:50 PM Version By: Christin Fudge MD, FACS Previous Signature: 10/16/2015 2:49:17 PM Version By: Christin Fudge MD, FACS Entered By: Christin Fudge on 10/16/2015 16:17:40 Bielak, Gustavo Alfonso Patten ([id number]) -------------------------------------------------------------------------------- ROS/PFSH Details Patient Name: [name] Date of Service: 10/16/2015 1:30 PM Medical Record Number: [id number] Patient Account Number: [id number] Date of Birth/Sex: [birth year] (75 y.o. Male) Treating RN: Ahmed Prima Primary Care Physician: Jani Gravel Other Clinician: Referring Physician: Jani Gravel Treating Physician/Extender: Frann Rider in Treatment: 0 Information Obtained From Patient Wound History Do you currently have one or more open woundso Yes How many open wounds do you currently haveo 1 Approximately how long have you had your woundso 3 weeks How have you been treating your wound(s) until nowo xeroform and cleaning with saline Has your wound(s) ever healed and then re-openedo No Have you had any lab work done in the past montho No Have you tested positive for an antibiotic resistant organism (MRSA, No VRE)o Have you tested positive for osteomyelitis (bone infection)o No Have you had any tests for circulation on your legso No Have you had other problems associated with your woundso Infection, Swelling Integumentary (Skin) Complaints and Symptoms: Positive for: Wounds Constitutional Symptoms (General Health) Complaints and Symptoms: No Complaints or Symptoms Eyes Medical History: Positive for: Cataracts - surgery Ear/Nose/Mouth/Throat Complaints and Symptoms: Review of System Notes: [facility] Hematologic/Lymphatic Medical  History: Positive for: Anemia Leitch, Arman R. ([id number]) Respiratory Complaints and Symptoms: No Complaints or Symptoms Cardiovascular Complaints and Symptoms: Review of System Notes: mild mitral regurgitation CVA hyperlipidemia Medical History: Positive for: Hypertension Gastrointestinal Complaints and Symptoms: No Complaints or Symptoms Endocrine Medical History: Positive for: Type II Diabetes Time with diabetes: 2000 Treated with: Oral agents Blood sugar tested every day: Yes Tested : once a day Genitourinary Complaints and Symptoms: No Complaints or Symptoms Immunological Complaints and Symptoms: No Complaints or Symptoms Musculoskeletal Medical History: Positive for: Osteoarthritis Neurologic Complaints and Symptoms: No Complaints or Symptoms Oncologic George, Rock R. ([id number]) Complaints and Symptoms: No Complaints or Symptoms Psychiatric Complaints and Symptoms: No Complaints or Symptoms HBO Extended History Items Eyes: Cataracts Family and Social History Cancer: Yes - Siblings, Child; Diabetes: Yes - Siblings; Heart Disease: Yes - Mother; Hereditary Spherocytosis: No; Hypertension: No; Kidney Disease: No; Lung Disease: No; Seizures: No; Stroke: No; Thyroid Problems: No; Tuberculosis: No; Never smoker; Marital Status - Married; Alcohol Use: Never; Drug Use: No History; Caffeine Use: Daily; Financial Concerns: No; Food, Clothing or Shelter Needs: No; Support System Lacking: No; Transportation Concerns: No; Advanced Directives: No; Patient does not want information on Advanced Directives; Do not resuscitate: No; Living Will: No; Medical Power of Attorney: No Electronic Signature(s) Signed: 10/16/2015 3:52:04 PM By: Christin Fudge MD, FACS Signed: 10/16/2015 4:32:54 PM By: Alric Quan Entered By: Alric Quan on 10/16/2015 13:52:38 Matsuoka, Herschel R.  ([id number]) -------------------------------------------------------------------------------- SuperBill Details Patient Name: [name] Date of Service: 10/16/2015 Medical Record Number: [id number] Patient Account Number: [id number] Date of Birth/Sex:  May 24, 1940 (75 y.o. Male) Treating RN: Carolyne Fiscal, Debi Primary Care Physician: Jani Gravel Other Clinician: Referring Physician: Jani Gravel Treating Physician/Extender: Frann Rider in Treatment: 0 Diagnosis Coding ICD-10 Codes Code Description [id number] Type 2 diabetes mellitus with other skin ulcer L97.222 Non-pressure chronic ulcer of left calf with fat layer exposed I89.0 Lymphedema, not elsewhere classified Facility Procedures CPT4 Code: 60045997 Description: [place] VISIT-LEV 3 EST PT Modifier: Quantity: 1 CPT4 Code: 74142395 Description: 32023 - DEB SUBQ TISSUE 20 SQ CM/< ICD-10 Description Diagnosis E11.622 Type 2 diabetes mellitus with other skin ulcer L97.222 Non-pressure chronic ulcer of left calf with fat l I89.0 Lymphedema, not elsewhere classified Modifier: ayer exposed Quantity: 1 Physician Procedures CPT4 Code: 3435686 Description: 16837 - WC PHYS LEVEL 4 - NEW PT ICD-10 Description Diagnosis E11.622 Type 2 diabetes mellitus with other skin ulcer L97.222 Non-pressure chronic ulcer of left calf with fat l I89.0 Lymphedema, not elsewhere classified Modifier: 25 ayer exposed Quantity: 1 CPT4 Code: 2902111 Adorno, BOB Description: 11042 - WC PHYS SUBQ TISS 20 SQ CM ICD-10 Description Diagnosis E11.622 Type 2 diabetes mellitus with other skin ulcer L97.222 Non-pressure chronic ulcer of left calf with fat l I89.0 Lymphedema, not elsewhere classified BY R. ([id number]) Modifier: ayer exposed Quantity: 1 Electronic Signature(s) Signed: 10/16/2015 4:16:41 PM By: Christin Fudge MD, FACS Signed: 10/16/2015 4:32:54 PM By: Alric Quan Previous Signature: 10/16/2015 2:49:46 PM Version By: Christin Fudge MD,  FACS Entered By: Alric Quan on 10/16/2015 15:59:02

## 2015-10-16 NOTE — Progress Notes (Signed)
[name], [name] ([id number]) Visit Report for 10/16/2015 Abuse/Suicide Risk Screen Details Patient Name: [name], [name] Date of Service: 10/16/2015 1:30 PM Medical Record Number: [id number] Patient Account Number: [id number] Date of Birth/Sex: Aug 06, 1940 (75 y.o. Male) Treating RN: Ahmed Prima Primary Care Physician: Jani Gravel Other Clinician: Referring Physician: Jani Gravel Treating Physician/Extender: Frann Rider in Treatment: 0 Abuse/Suicide Risk Screen Items Answer ABUSE/SUICIDE RISK SCREEN: Has anyone close to you tried to hurt or harm you recentlyo No Do you feel uncomfortable with anyone in your familyo No Has anyone forced you do things that you didnot want to doo No Do you have any thoughts of harming yourselfo No Patient displays signs or symptoms of abuse and/or neglect. No Electronic Signature(s) Signed: 10/16/2015 4:32:54 PM By: Alric Quan Entered By: Alric Quan on 10/16/2015 13:52:47 [name]. ([id number]) -------------------------------------------------------------------------------- Activities of Daily Living Details Patient Name: [name] Date of Service: 10/16/2015 1:30 PM Medical Record Number: [id number] Patient Account Number: [id number] Date of Birth/Sex: January 11, 1941 (75 y.o. Male) Treating RN: Ahmed Prima Primary Care Physician: Jani Gravel Other Clinician: Referring Physician: Jani Gravel Treating Physician/Extender: Frann Rider in Treatment: 0 Activities of Daily Living Items Answer Activities of Daily Living (Please select one for each item) Drive Automobile Completely Able Take Medications Completely Able Use Telephone Completely Able Care for Appearance Completely Able Use Toilet Completely Able Bath / Shower Completely Able Dress Self Completely Able Feed Self Completely Able Walk Completely Able Get In / Out Bed Completely Able Housework Completely Able Prepare Meals Completely  [place] for Self Completely Able Electronic Signature(s) Signed: 10/16/2015 4:32:54 PM By: Alric Quan Entered By: Alric Quan on 10/16/2015 13:53:05 [name] ([id number]) -------------------------------------------------------------------------------- Education Assessment Details Patient Name: [name] Date of Service: 10/16/2015 1:30 PM Medical Record Number: [id number] Patient Account Number: [id number] Date of Birth/Sex: 1940-11-29 (75 y.o. Male) Treating RN: Ahmed Prima Primary Care Physician: Jani Gravel Other Clinician: Referring Physician: Jani Gravel Treating Physician/Extender: Frann Rider in Treatment: 0 Primary Learner Assessed: Patient Learning Preferences/Education Level/Primary Language Learning Preference: Explanation, Printed Material Highest Education Level: High School Preferred Language: English Cognitive Barrier Assessment/Beliefs Language Barrier: No Translator Needed: No Memory Deficit: No Emotional Barrier: No Cultural/Religious Beliefs Affecting Medical No Care: Physical Barrier Assessment Impaired Vision: No Impaired Hearing: Yes HOH Knowledge/Comprehension Assessment Knowledge Level: High Comprehension Level: High Ability to understand written High instructions: Ability to understand verbal High instructions: Motivation Assessment Anxiety Level: Calm Cooperation: Cooperative Education Importance: Acknowledges Need Interest in Health Problems: Asks Questions Perception: Coherent Willingness to Engage in Self- High Management Activities: Readiness to Engage in Self- High Management Activities: Electronic Signature(s) Signed: 10/16/2015 4:32:54 PM By: [name], [name] ([id number]) Entered By: Alric Quan on 10/16/2015 13:53:28 [name], [name] ([id number]) -------------------------------------------------------------------------------- Fall Risk  Assessment Details Patient Name: [name] Date of Service: 10/16/2015 1:30 PM Medical Record Number: [id number] Patient Account Number: [id number] Date of Birth/Sex: Sep 27, 1940 (75 y.o. Male) Treating RN: Ahmed Prima Primary Care Physician: Jani Gravel Other Clinician: Referring Physician: Jani Gravel Treating Physician/Extender: Frann Rider in Treatment: 0 Fall Risk Assessment Items Have you had 2 or more falls in the last 12 monthso 0 No Have you had any fall that resulted in injury in the last 12 monthso 0 No FALL RISK ASSESSMENT: History of falling - immediate or within 3 months 0 No Secondary diagnosis 0 No Ambulatory aid None/bed rest/wheelchair/nurse 0 No Crutches/cane/walker 0 No Furniture 0 No IV  Access/Saline Lock 0 No Gait/Training Normal/bed rest/immobile 0 No Weak 10 Yes Impaired 0 No Mental Status Oriented to own ability 0 Yes Electronic Signature(s) Signed: 10/16/2015 4:32:54 PM By: Alric Quan Entered By: Alric Quan on 10/16/2015 13:53:55 [name] ([id number]) -------------------------------------------------------------------------------- Foot Assessment Details Patient Name: [name] Date of Service: 10/16/2015 1:30 PM Medical Record Number: [id number] Patient Account Number: [id number] Date of Birth/Sex: April 26, 1940 (75 y.o. Male) Treating RN: Ahmed Prima Primary Care Physician: Jani Gravel Other Clinician: Referring Physician: Jani Gravel Treating Physician/Extender: Frann Rider in Treatment: 0 Foot Assessment Items Site Locations + = Sensation present, - = Sensation absent, C = Callus, U = Ulcer R = Redness, W = Warmth, M = Maceration, PU = Pre-ulcerative lesion F = Fissure, S = Swelling, D = Dryness Assessment Right: Left: Other Deformity: No No Prior Foot Ulcer: No No Prior Amputation: No No Charcot Joint: No No Ambulatory Status: Ambulatory Without Help Gait: Steady Electronic  Signature(s) Signed: 10/16/2015 4:32:54 PM By: Alric Quan Entered By: Alric Quan on 10/16/2015 13:58:41 [name], [name] ([id number]) -------------------------------------------------------------------------------- Nutrition Risk Assessment Details Patient Name: [name] Date of Service: 10/16/2015 1:30 PM Medical Record Number: [id number] Patient Account Number: [id number] Date of Birth/Sex: [birth year] (75 y.o. Male) Treating RN: Ahmed Prima Primary Care Physician: Jani Gravel Other Clinician: Referring Physician: Jani Gravel Treating Physician/Extender: Frann Rider in Treatment: 0 Height (in): 70 Weight (lbs): 165.7 Body Mass Index (BMI): 23.8 Nutrition Risk Assessment Items NUTRITION RISK SCREEN: I have an illness or condition that made me change the kind and/or 2 Yes amount of food I eat I eat fewer than two meals per day 3 Yes I eat few fruits and vegetables, or milk products 0 No I have three or more drinks of beer, liquor or wine almost every day 0 No I have tooth or mouth problems that make it hard for me to eat 0 No I don't always have enough money to buy the food I need 0 No I eat alone most of the time 0 No I take three or more different prescribed or over-the-counter drugs a 1 Yes day Without wanting to, I have lost or gained 10 pounds in the last six 0 No months I am not always physically able to shop, cook and/or feed myself 0 No Nutrition Protocols Good Risk Protocol Moderate Risk Protocol Electronic Signature(s) Signed: 10/16/2015 4:32:54 PM By: Alric Quan Entered By: Alric Quan on 10/16/2015 13:54:25

## 2015-10-16 NOTE — Progress Notes (Signed)
REINER, LOEWEN ([id number]) Visit Report for 10/16/2015 Allergy List Details Patient Name: LAJARVIS, ITALIANO Date of Service: 10/16/2015 1:30 PM Medical Record Number: [id number] Patient Account Number: [id number] Date of Birth/Sex: 1940-08-13 (75 y.o. Male) Treating RN: Ahmed Prima Primary Care Physician: Jani Gravel Other Clinician: Referring Physician: Jani Gravel Treating Physician/Extender: Frann Rider in Treatment: 0 Allergies Active Allergies NKDA Allergy Notes Electronic Signature(s) Signed: 10/16/2015 4:32:54 PM By: Alric Quan Entered By: Alric Quan on 10/16/2015 13:44:47 [place], [place]akes RMarland Kitchen ([id number]) -------------------------------------------------------------------------------- [place] Information Details Patient Name: [name] Date of Service: 10/16/2015 1:30 PM Medical Record Number: [id number] Patient Account Number: [id number] Date of Birth/Sex: 02-27-1940 (75 y.o. Male) Treating RN: Ahmed Prima Primary Care Physician: Jani Gravel Other Clinician: Referring Physician: Jani Gravel Treating Physician/Extender: Frann Rider in Treatment: 0 Visit Information Patient Arrived: Ambulatory Arrival Time: 13:40 Accompanied By: wife Transfer Assistance: None Patient Identification Verified: Yes Secondary Verification Process Yes Completed: Patient Requires Transmission- No Based Precautions: Patient Has Alerts: Yes Patient Alerts: Patient on Blood Thinner Warfarin DM II ABIs noncompressible Electronic Signature(s) Signed: 10/16/2015 4:32:54 PM By: Alric Quan Entered By: Alric Quan on 10/16/2015 14:02:34 [place], Jiovanny RMarland Kitchen ([id number]) -------------------------------------------------------------------------------- Clinic Level of Care Assessment Details Patient Name: [name] Date of Service: 10/16/2015 1:30 PM Medical Record Number: [id number] Patient Account Number: [id number] Date of Birth/Sex:  26-May-1940 (75 y.o. Male) Treating RN: Ahmed Prima Primary Care Physician: Jani Gravel Other Clinician: Referring Physician: Jani Gravel Treating Physician/Extender: Frann Rider in Treatment: 0 Clinic Level of Care Assessment Items TOOL 1 Quantity Score X - Use when EandM and Procedure is performed on INITIAL visit 1 0 ASSESSMENTS - Nursing Assessment / Reassessment X - General Physical Exam (combine w/ comprehensive assessment (listed just 1 20 below) when performed on new pt. evals) X - Comprehensive Assessment (HX, ROS, Risk Assessments, Wounds Hx, etc.) 1 25 ASSESSMENTS - Wound and Skin Assessment / Reassessment []  - Dermatologic / Skin Assessment (not related to wound area) 0 ASSESSMENTS - Ostomy and/or Continence Assessment and Care []  - Incontinence Assessment and Management 0 []  - Ostomy Care Assessment and Management (repouching, etc.) 0 PROCESS - Coordination of Care []  - Simple Patient / Family Education for ongoing care 0 X - Complex (extensive) Patient / Family Education for ongoing care 1 20 X - Staff obtains Programmer, systems, Records, Test Results / Process Orders 1 10 X - Staff telephones HHA, Nursing Homes / Clarify orders / etc 1 10 []  - Routine Transfer to another Facility (non-emergent condition) 0 []  - Routine Hospital Admission (non-emergent condition) 0 X - New Admissions / Biomedical engineer / Ordering NPWT, Apligraf, etc. 1 15 []  - Emergency Hospital Admission (emergent condition) 0 PROCESS - Special Needs []  - Pediatric / Minor Patient Management 0 []  - Isolation Patient Management 0 Yellin, Jie R. ([id number]) []  - Hearing / Language / Visual special needs 0 []  - Assessment of Community assistance (transportation, D/C planning, etc.) 0 []  - Additional assistance / Altered mentation 0 []  - Support Surface(s) Assessment (bed, cushion, seat, etc.) 0 INTERVENTIONS - Miscellaneous []  - External ear exam 0 []  - Patient Transfer (multiple staff /  Civil Service fast streamer / Similar devices) 0 []  - Simple Staple / Suture removal (25 or less) 0 []  - Complex Staple / Suture removal (26 or more) 0 []  - Hypo/Hyperglycemic Management (do not check if billed separately) 0 X - Ankle / Brachial Index (ABI) - do not check if billed separately 1 15 Has the patient  been seen at the hospital within the last three years: Yes Total Score: 115 Level Of Care: New/Established - Level 3 Electronic Signature(s) Signed: 10/16/2015 4:32:54 PM By: Alric Quan Entered By: Alric Quan on 10/16/2015 15:58:47 Rahm, Jamareon RMarland Kitchen ([id number]) -------------------------------------------------------------------------------- Encounter Discharge Information Details Patient Name: [name] Date of Service: 10/16/2015 1:30 PM Medical Record Number: [id number] Patient Account Number: [id number] Date of Birth/Sex: [birth year] (75 y.o. Male) Treating RN: Ahmed Prima Primary Care Physician: Jani Gravel Other Clinician: Referring Physician: Jani Gravel Treating Physician/Extender: Frann Rider in Treatment: 0 Encounter Discharge Information Items Discharge Pain Level: 0 Discharge Condition: Stable Ambulatory Status: Ambulatory Discharge Destination: Home Transportation: Private Auto Accompanied By: wife Schedule Follow-up Appointment: Yes Medication Reconciliation completed and provided to Patient/Care No Zykia [place]: Provided on Clinical Summary of Care: 10/16/2015 Form Type Recipient Paper Patient BP Electronic Signature(s) Signed: 10/16/2015 2:51:32 PM By: Ruthine Dose Entered By: Ruthine Dose on 10/16/2015 14:51:31 [place], Julius RMarland Kitchen ([id number]) -------------------------------------------------------------------------------- Lower Extremity Assessment Details Patient Name: [name] Date of Service: 10/16/2015 1:30 PM Medical Record Number: [id number] Patient Account Number: [id number] Date of Birth/Sex: December 29, 1940 (75 y.o.  Male) Treating RN: Ahmed Prima Primary Care Physician: Jani Gravel Other Clinician: Referring Physician: Jani Gravel Treating Physician/Extender: Frann Rider in Treatment: 0 Edema Assessment Assessed: Shirlyn Goltz: No] [Right: No] Edema: [Left: Ye] [Right: s] Calf Left: Right: Point of Measurement: 32 cm From Medial Instep 34.2 cm 33.5 cm Ankle Left: Right: Point of Measurement: 11 cm From Medial Instep 20.8 cm 21.5 cm Vascular Assessment Pulses: Posterior Tibial Dorsalis Pedis Doppler: [Left:Monophasic] [Right:Monophasic] Extremity colors, hair growth, and conditions: Extremity Color: [Left:Hyperpigmented] [Right:Hyperpigmented] Hair Growth on Extremity: [Left:No] Temperature of Extremity: [Left:Warm] [Right:Warm] Capillary Refill: [Left:< 3 seconds] [Right:< 3 seconds] Toe Nail Assessment Left: Right: Thick: No No Discolored: No No Deformed: No No Improper Length and Hygiene: No No Notes bilateral ABI's non-compressible Electronic Signature(s) Signed: 10/16/2015 4:32:54 PM By: Alric Quan Entered By: Alric Quan on 10/16/2015 14:06:57 [place], [place]. ([id number]) [place], BystromMarland Kitchen ([id number]) -------------------------------------------------------------------------------- Multi Wound Chart Details Patient Name: [name] Date of Service: 10/16/2015 1:30 PM Medical Record Number: [id number] Patient Account Number: [id number] Date of Birth/Sex: [birth year] (75 y.o. Male) Treating RN: Ahmed Prima Primary Care Physician: Jani Gravel Other Clinician: Referring Physician: Jani Gravel Treating Physician/Extender: Frann Rider in Treatment: 0 Vital Signs Height(in): 70 Pulse(bpm): 73 Weight(lbs): 165.7 Blood Pressure 157/57 (mmHg): Body Mass Index(BMI): 24 Temperature(F): 98.1 Respiratory Rate 16 (breaths/min): Photos: [1:No Photos] [N/A:N/A] Wound Location: [1:Lower Leg - Lateral] [N/A:N/A] Wounding Event: [1:Gradually Appeared]  [N/A:N/A] Primary Etiology: [1:Diabetic Wound/Ulcer of the Lower Extremity] [N/A:N/A] Comorbid History: [1:Cataracts, Anemia, Hypertension, Type II Diabetes, Osteoarthritis] [N/A:N/A] Date Acquired: [1:09/21/2015] [N/A:N/A] Weeks of Treatment: [1:0] [N/A:N/A] Wound Status: [1:Open] [N/A:N/A] Measurements L x W x D 2.1x1.5x0.1 [N/A:N/A] (cm) Area (cm) : [1:2.474] [N/A:N/A] Volume (cm) : [1:0.247] [N/A:N/A] Classification: [1:Grade 1] [N/A:N/A] Exudate Amount: [1:Large] [N/A:N/A] Exudate Type: [1:Serosanguineous] [N/A:N/A] Exudate Color: [1:red, brown] [N/A:N/A] Wound Margin: [1:Distinct, outline attached] [N/A:N/A] Granulation Amount: [1:None Present (0%)] [N/A:N/A] Necrotic Amount: [1:Large (67-100%)] [N/A:N/A] Necrotic Tissue: [1:Eschar, Adherent Slough] [N/A:N/A] Exposed Structures: [1:Fascia: No Fat: No Tendon: No Muscle: No Joint: No Bone: No] [N/A:N/A] Limited to Skin Breakdown Epithelialization: None N/A N/A Periwound Skin Texture: Edema: Yes N/A N/A Periwound Skin Moist: Yes N/A N/A Moisture: Periwound Skin Color: No Abnormalities Noted N/A N/A Temperature: No Abnormality N/A N/A Tenderness on Yes N/A N/A Palpation: Wound Preparation: Ulcer Cleansing: N/A N/A Rinsed/Irrigated with Saline Topical Anesthetic Applied: Other:  lidocaine 4% Treatment Notes Electronic Signature(s) Signed: 10/16/2015 4:32:54 PM By: Alric Quan Entered By: Alric Quan on 10/16/2015 14:17:16 [place], Alaster RMarland Kitchen ([id number]) -------------------------------------------------------------------------------- [place] Details Patient Name: [name] Date of Service: 10/16/2015 1:30 PM Medical Record Number: [id number] Patient Account Number: [id number] Date of Birth/Sex: [birth year] (75 y.o. Male) Treating RN: Ahmed Prima Primary Care Physician: Jani Gravel Other Clinician: Referring Physician: Jani Gravel Treating Physician/Extender: Frann Rider  in Treatment: 0 Active Inactive Abuse / Safety / Falls / Self Care Management Nursing Diagnoses: Potential for falls Goals: Patient will remain injury free Date Initiated: 10/16/2015 Goal Status: Active Interventions: Assess fall risk on admission and as needed Notes: Nutrition Nursing Diagnoses: Imbalanced nutrition Impaired glucose control: actual or potential Goals: Patient/caregiver agrees to and verbalizes understanding of need to use nutritional supplements and/or vitamins as prescribed Date Initiated: 10/16/2015 Goal Status: Active Patient/caregiver verbalizes understanding of need to maintain therapeutic glucose control per primary care physician Date Initiated: 10/16/2015 Goal Status: Active Interventions: Assess patient nutrition upon admission and as needed per policy Provide education on elevated blood sugars and impact on wound healing Notes: [place], Eberardo R. ([id number]) Orientation to the Wound Care Program Nursing Diagnoses: Knowledge deficit related to the wound healing center program Goals: Patient/caregiver will verbalize understanding of the [place] Date Initiated: 10/16/2015 Goal Status: Active Interventions: Provide education on orientation to the wound center Notes: Pain, Acute or Chronic Nursing Diagnoses: Pain, acute or chronic: actual or potential Potential alteration in comfort, pain Goals: Patient will verbalize adequate pain control and receive pain control interventions during procedures as needed Date Initiated: 10/16/2015 Goal Status: Active Interventions: Assess comfort goal upon admission Complete pain assessment as per visit requirements Notes: Wound/Skin Impairment Nursing Diagnoses: Impaired tissue integrity Goals: Ulcer/skin breakdown will have a volume reduction of 30% by week 4 Date Initiated: 10/16/2015 Goal Status: Active Ulcer/skin breakdown will have a volume reduction of 50% by week 8 Date  Initiated: 10/16/2015 Goal Status: Active Ulcer/skin breakdown will have a volume reduction of 80% by week 12 Date Initiated: 10/16/2015 NOLLIE, TERLIZZI ([id number]) Goal Status: Active Interventions: Assess patient/caregiver ability to perform ulcer/skin care regimen upon admission and as needed Assess ulceration(s) every visit Notes: Electronic Signature(s) Signed: 10/16/2015 4:32:54 PM By: Alric Quan Entered By: Alric Quan on 10/16/2015 16:19:06 [place], [place] America[place] Kitchen ([id number]) -------------------------------------------------------------------------------- Pain Assessment Details Patient Name: [name] Date of Service: 10/16/2015 1:30 PM Medical Record Number: [id number] Patient Account Number: [id number] Date of Birth/Sex: [birth year] (75 y.o. Male) Treating RN: Ahmed Prima Primary Care Physician: Jani Gravel Other Clinician: Referring Physician: Jani Gravel Treating Physician/Extender: Frann Rider in Treatment: 0 Active Problems Location of Pain Severity and Description of Pain Patient Has Paino No Site Locations With Dressing Change: No Pain Management and Medication Current Pain Management: Electronic Signature(s) Signed: 10/16/2015 4:32:54 PM By: Alric Quan Entered By: Alric Quan on 10/16/2015 13:40:54 [place], [name] ([id number]) -------------------------------------------------------------------------------- Patient/Caregiver Education Details Patient Name: [name] Date of Service: 10/16/2015 1:30 PM Medical Record Number: [id number] Patient Account Number: [id number] Date of Birth/Gender: January 18, 1941 (75 y.o. Male) Treating RN: Ahmed Prima Primary Care Physician: Jani Gravel Other Clinician: Referring Physician: Jani Gravel Treating Physician/Extender: Frann Rider in Treatment: 0 Education Assessment Education Provided To: Patient Education Topics Provided Welcome To [facility]: Handouts:  Welcome To [facility] Methods: Explain/Verbal Wound/Skin Impairment: Handouts: Other: change dressing as ordered Methods: Demonstration, Explain/Verbal Responses: State content correctly Electronic Signature(s) Signed: 10/16/2015 4:32:54  PM By: Alric Quan Entered By: Alric Quan on 10/16/2015 14:14:34 [place], [name] ([id number]) -------------------------------------------------------------------------------- Wound Assessment Details Patient Name: [name] Date of Service: 10/16/2015 1:30 PM Medical Record Number: [id number] Patient Account Number: [id number] Date of Birth/Sex: [birth year] (75 y.o. Male) Treating RN: Ahmed Prima Primary Care Physician: Jani Gravel Other Clinician: Referring Physician: Jani Gravel Treating Physician/Extender: Frann Rider in Treatment: 0 Wound Status Wound Number: 1 Primary Diabetic Wound/Ulcer of the Lower Etiology: Extremity Wound Location: Lower Leg - Anterior Wound Open Wounding Event: Gradually Appeared Status: Date Acquired: 09/21/2015 Comorbid Cataracts, Anemia, Hypertension, Weeks Of Treatment: 0 History: Type II Diabetes, Osteoarthritis Clustered Wound: No Photos Wound Measurements Length: (cm) 2.1 Width: (cm) 1.5 Depth: (cm) 0.1 Area: (cm) 2.474 Volume: (cm) 0.247 % Reduction in Area: 0% % Reduction in Volume: 0% Epithelialization: None Tunneling: No Undermining: No Wound Description Classification: Grade 1 Wound Margin: Distinct, outline attached Exudate Amount: Large Exudate Type: Serosanguineous Exudate Color: red, brown Foul Odor After Cleansing: No Wound Bed Granulation Amount: None Present (0%) Exposed Structure Necrotic Amount: Large (67-100%) Fascia Exposed: No Necrotic Quality: Eschar, Adherent Slough Fat Layer Exposed: No Tendon Exposed: No Muscle Exposed: No Steely, Zed R. ([id number]) Joint Exposed: No Bone Exposed: No Limited to Skin Breakdown Periwound  Skin Texture Texture Color No Abnormalities Noted: No No Abnormalities Noted: No Localized Edema: Yes Temperature / Pain Moisture Temperature: No Abnormality No Abnormalities Noted: No Tenderness on Palpation: Yes Moist: Yes Wound Preparation Ulcer Cleansing: Rinsed/Irrigated with Saline Topical Anesthetic Applied: Other: lidocaine 4%, Treatment Notes Wound #1 (Anterior Lower Leg) 1. Cleansed with: Clean wound with Normal Saline 2. Anesthetic Topical Lidocaine 4% cream to wound bed prior to debridement 4. Dressing Applied: Santyl Ointment Plain packing gauze 5. Secondary Dressing Applied ABD Pad Dry Gauze Kerlix/Conform 7. Secured with Tape Notes netting Electronic Signature(s) Signed: 10/16/2015 4:32:54 PM By: Alric Quan Entered By: Alric Quan on 10/16/2015 15:43:01 [place], Lorenza RMarland Kitchen ([id number]) -------------------------------------------------------------------------------- Vitals Details Patient Name: [name] Date of Service: 10/16/2015 1:30 PM Medical Record Number: [id number] Patient Account Number: [id number] Date of Birth/Sex: [birth year] (75 y.o. Male) Treating RN: Ahmed Prima Primary Care Physician: Jani Gravel Other Clinician: Referring Physician: Jani Gravel Treating Physician/Extender: Frann Rider in Treatment: 0 Vital Signs Time Taken: 13:41 Temperature (F): 98.1 Height (in): 70 Pulse (bpm): 73 Source: Stated Respiratory Rate (breaths/min): 16 Weight (lbs): 165.7 Blood Pressure (mmHg): 157/57 Source: Measured Reference Range: 80 - 120 mg / dl Body Mass Index (BMI): 23.8 Electronic Signature(s) Signed: 10/16/2015 4:32:54 PM By: Alric Quan Entered By: Alric Quan on 10/16/2015 13:43:01

## 2015-10-17 DIAGNOSIS — Z792 Long term (current) use of antibiotics: Secondary | ICD-10-CM | POA: Diagnosis not present

## 2015-10-17 DIAGNOSIS — I10 Essential (primary) hypertension: Secondary | ICD-10-CM | POA: Diagnosis not present

## 2015-10-17 DIAGNOSIS — Z7901 Long term (current) use of anticoagulants: Secondary | ICD-10-CM | POA: Diagnosis not present

## 2015-10-17 DIAGNOSIS — Z79891 Long term (current) use of opiate analgesic: Secondary | ICD-10-CM | POA: Diagnosis not present

## 2015-10-17 DIAGNOSIS — Z5181 Encounter for therapeutic drug level monitoring: Secondary | ICD-10-CM | POA: Diagnosis not present

## 2015-10-17 DIAGNOSIS — Z7984 Long term (current) use of oral hypoglycemic drugs: Secondary | ICD-10-CM | POA: Diagnosis not present

## 2015-10-17 DIAGNOSIS — K7460 Unspecified cirrhosis of liver: Secondary | ICD-10-CM | POA: Diagnosis not present

## 2015-10-17 DIAGNOSIS — D693 Immune thrombocytopenic purpura: Secondary | ICD-10-CM | POA: Diagnosis not present

## 2015-10-17 DIAGNOSIS — E119 Type 2 diabetes mellitus without complications: Secondary | ICD-10-CM | POA: Diagnosis not present

## 2015-10-17 DIAGNOSIS — R238 Other skin changes: Secondary | ICD-10-CM | POA: Diagnosis not present

## 2015-10-17 DIAGNOSIS — D649 Anemia, unspecified: Secondary | ICD-10-CM | POA: Diagnosis not present

## 2015-10-17 DIAGNOSIS — L03116 Cellulitis of left lower limb: Secondary | ICD-10-CM | POA: Diagnosis not present

## 2015-10-18 ENCOUNTER — Encounter: Payer: Self-pay | Admitting: Oncology

## 2015-10-18 ENCOUNTER — Ambulatory Visit (INDEPENDENT_AMBULATORY_CARE_PROVIDER_SITE_OTHER): Payer: Medicare Other | Admitting: Oncology

## 2015-10-18 VITALS — BP 159/48 | HR 70 | Temp 97.9°F | Ht 69.0 in | Wt 168.7 lb

## 2015-10-18 DIAGNOSIS — I825Z1 Chronic embolism and thrombosis of unspecified deep veins of right distal lower extremity: Secondary | ICD-10-CM | POA: Diagnosis not present

## 2015-10-18 DIAGNOSIS — D693 Immune thrombocytopenic purpura: Secondary | ICD-10-CM

## 2015-10-18 DIAGNOSIS — I81 Portal vein thrombosis: Secondary | ICD-10-CM | POA: Diagnosis not present

## 2015-10-18 DIAGNOSIS — Z8679 Personal history of other diseases of the circulatory system: Secondary | ICD-10-CM | POA: Diagnosis not present

## 2015-10-18 DIAGNOSIS — K7469 Other cirrhosis of liver: Secondary | ICD-10-CM

## 2015-10-18 DIAGNOSIS — D649 Anemia, unspecified: Secondary | ICD-10-CM | POA: Diagnosis not present

## 2015-10-18 DIAGNOSIS — D72819 Decreased white blood cell count, unspecified: Secondary | ICD-10-CM | POA: Diagnosis not present

## 2015-10-18 LAB — SAVE SMEAR

## 2015-10-18 NOTE — Patient Instructions (Signed)
To lab today Return visit in October to go over results

## 2015-10-18 NOTE — Progress Notes (Signed)
Hematology and Oncology Follow Up Visit  [name] [id number] [birth year] 75 y.o. 10/18/2015 4:34 PM   Principle Diagnosis: Encounter Diagnoses  Name Primary?  . Normochromic anemia Yes  . ITP (idiopathic thrombocytopenic purpura)   . Leukopenia   . Thrombosis, portal vein   . DVT, lower extremity, distal, chronic, right    Interim History:   Pleasant 75 year old man who I initially evaluated in July 2014. He has had chronic thrombocytopenia dating back at least as far as November 2005. Platelet count runs between 30 and 60,000. He had a normal hemoglobin, chronic, mild, lower limit of normal white count with normal differential. 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 It is not clear whether the thrombocytopenia antedated the thrombosis since all old records have been purged from the system. He was signed out with a diagnosis of chronic ITP. I had no recent to question this. He did not use alcohol. He had no exposure to toxic chemicals or high-dose insecticides. No history of hepatitis or yellow jaundice. When I saw him in July 2014 his platelet count was 35,000. Review of the peripheral blood normal except for decreased platelets. I advised observation alone at that time. He had one follow-up visit with me in September 2014. Platelet count unchanged. He was lost to follow-up until now. He is currently on Coumadin 4 mg daily started subsequent to a presumed right lower extremity DVT which occurred in association with an episode of cellulitis in 2015.  He has type 2 diabetes on oral agents, hyperlipidemia, fatty liver, cerebrovascular disease with a TIA approximately 2004. 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 Quite amazingly, his medications have not changed much since I saw him 3  years ago.  He was admitted to the hospital in January 2015 for a cellulitis of his right lower extremity not responding to outpatient antibiotics. He was told he had a right lower extremity DVT but I do not find documentation for this. He was recently readmitted to the hospital for severe cellulitis affecting his left calf. Doppler studies were done and he did not have a left lower extremity DVT. Platelet count in the 50-60,000 range. No change in his borderline decreased total white count.  He has developed a progressive normochromic anemia since January 2015 with hemoglobin  now ranging between 9 and 10.  Lab done 10/27/2014 included normal B12, 751; normal methylmalonic acid 126, normal ceruloplasmin 106 (72-1 66).  Labs recently repeated on 07/05/2015 serum iron 69, saturation 17%, ferritin 21.6 range 22-322. normal alpha-1 antitrypsin level. Hepatitis B surface antigen, hepatitis C antibody both negative. Hepatitis A antibody positive. Negative antimitochondrial and anti-smooth muscle antibodies. Negative ANA. 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  Most recent colonoscopy was done in 2010 and was unremarkable except for some minimal focal colitis.  He had a CT scan of the abdomen 02/07/2014 to evaluate unexplained weight loss. Liver appeared normal. Mild splenomegaly noted. 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 Subsequent CT scan of the abdomen in February 2017 to look at his kidneys when he developed microscopic hematuria suggested mild nodularity with concern for early cirrhosis and he was referred to Dr Fuller Plan, gastroenterology, for further evaluation. The images are in  the system but there is no report. I am not able to appreciate any major abnormalities in the liver on my review. The patient believes that another recent CT  scan done ordered by  gastroenterology, but I cannot find this in the system.  He had an MRI of his brain in September 2016 done to evaluate loss of taste which was one of the major reasons he was losing weight. He has a good appetite but still has loss of taste. Current weight is 169. He weighed 184 pounds when he last saw me in September 2014.      Medications: reviewed  Allergies: No Known Allergies  Review of Systems: See Interim history Remaining ROS negative:   Physical Exam: Blood pressure (!) 159/48, pulse 70, temperature 97.9 F (36.6 C), temperature source Oral, height _0  (1.753 m), weight 168 lb 11.2 oz (76.5 kg), SpO2 (!) 10 %. Wt Readings from Last 3 Encounters:  10/18/15 168 lb 11.2 oz (76.5 kg)  10/02/15 165 lb 11.2 oz (75.2 kg)  09/25/15 167 lb (75.8 kg)     General appearance: thin but adequately nourished Caucasian man HENNT: Pharynx no erythema, exudate, mass, or ulcer. No thyromegaly or thyroid nodules Lymph nodes: No cervical, supraclavicular, or axillary lymphadenopathy Breasts:  Lungs: Clear to auscultation, resonant to percussion throughout Heart: Regular rhythm, no murmur, no gallop, no rub, no click, no edema Abdomen: Soft, nontender, normal bowel sounds, no mass, no hepatomegaly. Spleen tip felt 2-3 cm below left coastal margin Extremities: No edema, no calf tenderness. Bandage around area of ulcerated cellulitis left calf which I did not remove. Musculoskeletal: no joint deformities GU:  Vascular: Carotid pulses 2+, no bruits,  Neurologic: Alert, oriented, PERRLA,  cranial nerves grossly normal, motor strength 5 over 5, reflexes 1+ symmetric, upper body coordination normal, gait normal, Skin: No rash, jaundice,  or ecchymosis; Peutz Jaeger lesion lower lip;  1 spider angioma on anterior chest, bib area  Lab Results: CBC W/Diff    Component Value Date/Time   WBC 4.2 10/02/2015 0722   RBC 2.97 (L) 10/02/2015 0722   HGB 9.1 (L) 10/02/2015 0722    HGB 12.9 (L) 01/19/2013 0946   HCT 28.3 (L) 10/02/2015 0722   HCT 37.6 (L) 01/19/2013 0946   PLT 59 (L) 10/02/2015 0722   PLT 39 (L) 01/19/2013 0946   MCV 95.3 10/02/2015 0722   MCV 95.3 01/19/2013 0946   MCH 30.6 10/02/2015 0722   MCHC 32.2 10/02/2015 0722   RDW 15.3 10/02/2015 0722   RDW 14.7 (H) 01/19/2013 0946   LYMPHSABS 0.7 09/25/2015 0806   LYMPHSABS 1.2 01/19/2013 0946   MONOABS 0.4 09/25/2015 0806   MONOABS 0.5 01/19/2013 0946   EOSABS 0.1 09/25/2015 0806   EOSABS 0.2 01/19/2013 0946   BASOSABS 0.0 09/25/2015 0806   BASOSABS 0.0 01/19/2013 0946     Chemistry      Component Value Date/Time   NA 140 10/02/2015 0722   NA 140 09/01/2012 0933   K 4.0 10/02/2015 0722   K 4.6 09/01/2012 0933   CL 111 10/02/2015 0722   CO2 24 10/02/2015 0722   CO2 24 09/01/2012 0933   BUN 14 10/02/2015 0722   BUN 16.2 09/01/2012 0933   CREATININE 1.23 10/02/2015 0722   CREATININE 1.1 09/01/2012 0933      Component Value Date/Time   CALCIUM 8.4 (L) 10/02/2015 0722   CALCIUM 9.2 09/01/2012 0933   ALKPHOS 42 09/30/2015 0750   ALKPHOS 55 09/01/2012 0933  AST 25 09/30/2015 0750   AST 27 09/01/2012 0933   ALT 18 09/30/2015 0750   ALT 28 09/01/2012 0933   BILITOT 0.7 09/30/2015 0750   BILITOT 0.91 09/01/2012 0933       Radiological Studies: Dg Tibia/fibula Left  Result Date: 09/30/2015 CLINICAL DATA:  75 year old male with cellulitis in the left lower extremity with pain. Initial encounter. EXAM: LEFT TIBIA AND FIBULA - 2 VIEW COMPARISON:  Lower extremity ultrasound from today reported separately. FINDINGS: Bone mineralization is within normal limits for age. Alignment at the left knee and ankle is preserved. Left tibia and fibula intact. No acute osseous abnormality identified. Widespread subcutaneous stranding compatible with inflammation. No subcutaneous gas. Mild calcified peripheral vascular disease. No radiopaque foreign body identified. IMPRESSION: Soft tissue swelling and  stranding. No subcutaneous gas or acute osseous abnormality identified about the left tib-fib. Electronically Signed   By: Genevie Ann M.D.   On: 09/30/2015 16:42   [place] Extrem Low Left Ltd  Result Date: 09/30/2015 CLINICAL DATA:  Cellulitis, redness that distal LEFT leg for 3 days EXAM: ULTRASOUND LEFT LOWER EXTREMITY LIMITED TECHNIQUE: Ultrasound examination of the lower extremity soft tissues was performed in the area of clinical concern. COMPARISON:  None FINDINGS: Subcutaneous edema at the site of clinical concern with mild skin thickening. No focal fluid collection identified to suggest abscess. No calcification or solid mass. IMPRESSION: Subcutaneous edema without focal mass or fluid collection. Electronically Signed   By: Lavonia Dana M.D.   On: 09/30/2015 16:36    Impression:  #1. Chronic, stable, thrombocytopenia in range 30-60,000 unchanged for about 20 years now. No further investigation indicated.  #2. 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 He was not on anticoagulation when I saw him 3 years ago. There was a history of bleeding complications with anticoagulation when he was initially diagnosed with the portal vein thrombosis. I did not put him back on anticoagulation. There is a poorly documented history of a right lower extremity DVT occurring in 2015 at time of a right lower extremity cellulitis. If he did have a DVT at that time I would consider this to be a provoked event for which only short-term anticoagulation would be indicated. In my opinion, his warfarin could be stopped.  #3. Weight loss, normochromic anemia,desgusia. To date, no evidence for occult malignancy. At his age he could be developing a myelodysplastic syndrome but this appears unlikely given the stability of his white count and platelet count over time. He does have stage II renal insufficiency which may be contributing. I'm going to  check an erythropoietin level. I will also check for the JAK-2 gene mutation to assess for an underlying myeloproliferative disorder. I would also consider screening labs for multiple myeloma. I will see him back when results of these tests are available.  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 No evidence for a viral or immune related process based on extensive laboratory testing done through Dr. Lynne Leader office.   CC: Patient Care Team: Jani Gravel, MD as PCP - General (Internal Medicine)   Annia Belt, MD 8/30/20174:34 PM

## 2015-10-19 LAB — CBC WITH DIFFERENTIAL/PLATELET
BASOS ABS: 0 10*3/uL (ref 0.0–0.2)
Basos: 0 %
EOS (ABSOLUTE): 0.1 10*3/uL (ref 0.0–0.4)
Eos: 2 %
Hematocrit: 31.9 % — ABNORMAL LOW (ref 37.5–51.0)
Hemoglobin: 10.2 g/dL — ABNORMAL LOW (ref 12.6–17.7)
IMMATURE GRANS (ABS): 0 10*3/uL (ref 0.0–0.1)
IMMATURE GRANULOCYTES: 0 %
LYMPHS: 21 %
Lymphocytes Absolute: 1 10*3/uL (ref 0.7–3.1)
MCH: 30.5 pg (ref 26.6–33.0)
MCHC: 32 g/dL (ref 31.5–35.7)
MCV: 96 fL (ref 79–97)
MONOCYTES: 9 %
Monocytes Absolute: 0.4 10*3/uL (ref 0.1–0.9)
NEUTROS ABS: 3 10*3/uL (ref 1.4–7.0)
NEUTROS PCT: 68 %
PLATELETS: 48 10*3/uL — AB (ref 150–379)
RBC: 3.34 x10E6/uL — ABNORMAL LOW (ref 4.14–5.80)
RDW: 17.1 % — AB (ref 12.3–15.4)
WBC: 4.5 10*3/uL (ref 3.4–10.8)

## 2015-10-19 LAB — ERYTHROPOIETIN: ERYTHROPOIETIN: 78.5 m[IU]/mL — AB (ref 2.6–18.5)

## 2015-10-20 DIAGNOSIS — I10 Essential (primary) hypertension: Secondary | ICD-10-CM | POA: Diagnosis not present

## 2015-10-20 DIAGNOSIS — Z792 Long term (current) use of antibiotics: Secondary | ICD-10-CM | POA: Diagnosis not present

## 2015-10-20 DIAGNOSIS — D693 Immune thrombocytopenic purpura: Secondary | ICD-10-CM | POA: Diagnosis not present

## 2015-10-20 DIAGNOSIS — R238 Other skin changes: Secondary | ICD-10-CM | POA: Diagnosis not present

## 2015-10-20 DIAGNOSIS — Z7901 Long term (current) use of anticoagulants: Secondary | ICD-10-CM | POA: Diagnosis not present

## 2015-10-20 DIAGNOSIS — D649 Anemia, unspecified: Secondary | ICD-10-CM | POA: Diagnosis not present

## 2015-10-20 DIAGNOSIS — Z7984 Long term (current) use of oral hypoglycemic drugs: Secondary | ICD-10-CM | POA: Diagnosis not present

## 2015-10-20 DIAGNOSIS — L03116 Cellulitis of left lower limb: Secondary | ICD-10-CM | POA: Diagnosis not present

## 2015-10-20 DIAGNOSIS — Z5181 Encounter for therapeutic drug level monitoring: Secondary | ICD-10-CM | POA: Diagnosis not present

## 2015-10-20 DIAGNOSIS — Z79891 Long term (current) use of opiate analgesic: Secondary | ICD-10-CM | POA: Diagnosis not present

## 2015-10-20 DIAGNOSIS — E119 Type 2 diabetes mellitus without complications: Secondary | ICD-10-CM | POA: Diagnosis not present

## 2015-10-20 DIAGNOSIS — K7460 Unspecified cirrhosis of liver: Secondary | ICD-10-CM | POA: Diagnosis not present

## 2015-10-24 DIAGNOSIS — Z7901 Long term (current) use of anticoagulants: Secondary | ICD-10-CM | POA: Diagnosis not present

## 2015-10-24 DIAGNOSIS — L03116 Cellulitis of left lower limb: Secondary | ICD-10-CM | POA: Diagnosis not present

## 2015-10-24 DIAGNOSIS — D649 Anemia, unspecified: Secondary | ICD-10-CM | POA: Diagnosis not present

## 2015-10-24 DIAGNOSIS — K7460 Unspecified cirrhosis of liver: Secondary | ICD-10-CM | POA: Diagnosis not present

## 2015-10-24 DIAGNOSIS — D693 Immune thrombocytopenic purpura: Secondary | ICD-10-CM | POA: Diagnosis not present

## 2015-10-24 DIAGNOSIS — E119 Type 2 diabetes mellitus without complications: Secondary | ICD-10-CM | POA: Diagnosis not present

## 2015-10-24 DIAGNOSIS — Z79891 Long term (current) use of opiate analgesic: Secondary | ICD-10-CM | POA: Diagnosis not present

## 2015-10-24 DIAGNOSIS — Z792 Long term (current) use of antibiotics: Secondary | ICD-10-CM | POA: Diagnosis not present

## 2015-10-24 DIAGNOSIS — R238 Other skin changes: Secondary | ICD-10-CM | POA: Diagnosis not present

## 2015-10-24 DIAGNOSIS — I10 Essential (primary) hypertension: Secondary | ICD-10-CM | POA: Diagnosis not present

## 2015-10-24 DIAGNOSIS — Z7984 Long term (current) use of oral hypoglycemic drugs: Secondary | ICD-10-CM | POA: Diagnosis not present

## 2015-10-24 DIAGNOSIS — Z5181 Encounter for therapeutic drug level monitoring: Secondary | ICD-10-CM | POA: Diagnosis not present

## 2015-10-24 LAB — JAK2 GENOTYPR

## 2015-10-25 ENCOUNTER — Other Ambulatory Visit: Payer: Self-pay | Admitting: Surgery

## 2015-10-25 ENCOUNTER — Ambulatory Visit (HOSPITAL_COMMUNITY)
Admission: RE | Admit: 2015-10-25 | Discharge: 2015-10-25 | Disposition: A | Discharge status: Home / Self Care | Payer: Medicare Other | Source: Ambulatory Visit | Attending: Vascular Surgery | Admitting: Vascular Surgery

## 2015-10-25 DIAGNOSIS — E11622 Type 2 diabetes mellitus with other skin ulcer: Secondary | ICD-10-CM

## 2015-10-25 DIAGNOSIS — M7989 Other specified soft tissue disorders: Secondary | ICD-10-CM | POA: Insufficient documentation

## 2015-10-25 DIAGNOSIS — L97929 Non-pressure chronic ulcer of unspecified part of left lower leg with unspecified severity: Secondary | ICD-10-CM | POA: Insufficient documentation

## 2015-10-25 DIAGNOSIS — I8392 Asymptomatic varicose veins of left lower extremity: Secondary | ICD-10-CM | POA: Insufficient documentation

## 2015-10-25 DIAGNOSIS — L03116 Cellulitis of left lower limb: Secondary | ICD-10-CM

## 2015-10-26 ENCOUNTER — Other Ambulatory Visit (HOSPITAL_COMMUNITY): Payer: Self-pay | Admitting: Internal Medicine

## 2015-10-26 ENCOUNTER — Other Ambulatory Visit: Payer: Self-pay | Admitting: Surgery

## 2015-10-26 ENCOUNTER — Ambulatory Visit (HOSPITAL_COMMUNITY)
Admission: RE | Admit: 2015-10-26 | Discharge: 2015-10-26 | Disposition: A | Discharge status: Home / Self Care | Payer: Medicare Other | Source: Ambulatory Visit | Attending: Vascular Surgery | Admitting: Vascular Surgery

## 2015-10-26 ENCOUNTER — Encounter: Payer: Medicare Other | Attending: Surgery | Admitting: Surgery

## 2015-10-26 DIAGNOSIS — E11622 Type 2 diabetes mellitus with other skin ulcer: Secondary | ICD-10-CM | POA: Insufficient documentation

## 2015-10-26 DIAGNOSIS — L97222 Non-pressure chronic ulcer of left calf with fat layer exposed: Secondary | ICD-10-CM | POA: Insufficient documentation

## 2015-10-26 DIAGNOSIS — Z7984 Long term (current) use of oral hypoglycemic drugs: Secondary | ICD-10-CM | POA: Diagnosis not present

## 2015-10-26 DIAGNOSIS — L97209 Non-pressure chronic ulcer of unspecified calf with unspecified severity: Secondary | ICD-10-CM | POA: Insufficient documentation

## 2015-10-26 DIAGNOSIS — R938 Abnormal findings on diagnostic imaging of other specified body structures: Secondary | ICD-10-CM | POA: Insufficient documentation

## 2015-10-26 DIAGNOSIS — I6523 Occlusion and stenosis of bilateral carotid arteries: Secondary | ICD-10-CM | POA: Insufficient documentation

## 2015-10-26 DIAGNOSIS — Z86718 Personal history of other venous thrombosis and embolism: Secondary | ICD-10-CM | POA: Diagnosis not present

## 2015-10-26 DIAGNOSIS — I890 Lymphedema, not elsewhere classified: Secondary | ICD-10-CM | POA: Diagnosis not present

## 2015-10-26 DIAGNOSIS — M1990 Unspecified osteoarthritis, unspecified site: Secondary | ICD-10-CM | POA: Insufficient documentation

## 2015-10-26 DIAGNOSIS — L97821 Non-pressure chronic ulcer of other part of left lower leg limited to breakdown of skin: Secondary | ICD-10-CM | POA: Diagnosis not present

## 2015-10-26 DIAGNOSIS — I10 Essential (primary) hypertension: Secondary | ICD-10-CM | POA: Diagnosis not present

## 2015-10-26 DIAGNOSIS — E785 Hyperlipidemia, unspecified: Secondary | ICD-10-CM | POA: Insufficient documentation

## 2015-10-26 DIAGNOSIS — Z8673 Personal history of transient ischemic attack (TIA), and cerebral infarction without residual deficits: Secondary | ICD-10-CM | POA: Diagnosis not present

## 2015-10-26 DIAGNOSIS — Z7901 Long term (current) use of anticoagulants: Secondary | ICD-10-CM | POA: Insufficient documentation

## 2015-10-26 DIAGNOSIS — Z79899 Other long term (current) drug therapy: Secondary | ICD-10-CM | POA: Insufficient documentation

## 2015-10-26 DIAGNOSIS — D649 Anemia, unspecified: Secondary | ICD-10-CM | POA: Diagnosis not present

## 2015-10-27 ENCOUNTER — Ambulatory Visit: Payer: Medicare Other | Admitting: Gastroenterology

## 2015-10-27 DIAGNOSIS — Z7984 Long term (current) use of oral hypoglycemic drugs: Secondary | ICD-10-CM | POA: Diagnosis not present

## 2015-10-27 DIAGNOSIS — Z79891 Long term (current) use of opiate analgesic: Secondary | ICD-10-CM | POA: Diagnosis not present

## 2015-10-27 DIAGNOSIS — E119 Type 2 diabetes mellitus without complications: Secondary | ICD-10-CM | POA: Diagnosis not present

## 2015-10-27 DIAGNOSIS — Z7901 Long term (current) use of anticoagulants: Secondary | ICD-10-CM | POA: Diagnosis not present

## 2015-10-27 DIAGNOSIS — D693 Immune thrombocytopenic purpura: Secondary | ICD-10-CM | POA: Diagnosis not present

## 2015-10-27 DIAGNOSIS — I10 Essential (primary) hypertension: Secondary | ICD-10-CM | POA: Diagnosis not present

## 2015-10-27 DIAGNOSIS — Z5181 Encounter for therapeutic drug level monitoring: Secondary | ICD-10-CM | POA: Diagnosis not present

## 2015-10-27 DIAGNOSIS — K7460 Unspecified cirrhosis of liver: Secondary | ICD-10-CM | POA: Diagnosis not present

## 2015-10-27 DIAGNOSIS — Z792 Long term (current) use of antibiotics: Secondary | ICD-10-CM | POA: Diagnosis not present

## 2015-10-27 DIAGNOSIS — R238 Other skin changes: Secondary | ICD-10-CM | POA: Diagnosis not present

## 2015-10-27 DIAGNOSIS — L03116 Cellulitis of left lower limb: Secondary | ICD-10-CM | POA: Diagnosis not present

## 2015-10-27 DIAGNOSIS — D649 Anemia, unspecified: Secondary | ICD-10-CM | POA: Diagnosis not present

## 2015-10-27 NOTE — Progress Notes (Signed)
**Note DeNguyenIdentified via Obfuscation** [name], [name] ([id number]) Visit Report for 10/26/2015 Chief Complaint Document Details Patient Name: [name], [name] Date of Service: 10/26/2015 12:45 PM Medical Record Number: [id number] Patient Account Number: [id number] Date of Birth/Sex: 07NguyenJunNguyen1942 (75 y.o. Male) Treating RN: Ahmed Prima Primary Care Physician: Jani Gravel Other Clinician: Referring Physician: Jani Gravel Treating Physician/Extender: Frann Rider in Treatment: 1 Information Obtained from: Patient Chief Complaint Patients presents for treatment of an open diabetic ulcer left lower extremity with swelling, cellulitis and ulceration for about a month Electronic Signature(s) Signed: 10/26/2015 1:19:39 PM By: Christin Fudge MD, FACS Entered By: Christin Fudge on 10/26/2015 13:19:39 [name], [name] ([id number]) -------------------------------------------------------------------------------- HPI Details Patient Name: [name] Date of Service: 10/26/2015 12:45 PM Medical Record Number: [id number] Patient Account Number: [id number] Date of Birth/Sex: 27NguyenFebNguyen1942 (75 y.o. Male) Treating RN: Ahmed Prima Primary Care Physician: Jani Gravel Other Clinician: Referring Physician: Jani Gravel Treating Physician/Extender: Frann Rider in Treatment: 1 History of Present Illness Location: left lower extremity Quality: Patient reports experiencing a dull pain to affected area(s). Severity: Patient states wound are getting worse. Duration: Patient has had the wound for < 4 weeks prior to presenting for treatment Timing: Pain in wound is Intermittent (comes and goes Context: The wound appeared gradually over time Modifying Factors: Other treatment(s) tried include:hospital admission with IV antibiotics and workup Associated Signs and Symptoms: Patient reports having increase swelling. HPI Description: 75 year old gentleman who was been having his primary care taken care of by Dr. Maudie Mercury at [facility] has had recurrent cellulitis the left lower extremity and so far has had 3 attacks. First started in August of this year and was admitted to the ER and sent home with Keflex and. He saw his primary care doctor on August 9 and was referred to [facility] where they began intravenous antibiotics and was admitted to August 15. He at present on doxycycline 100 mg twice a day. 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 Never been a smoker. During the admission he had a ultrasound done to look for a abscess and none was found except for subcutaneous edema and there was no focal muscle fluid collection. Xray of the left lower extremity --IMPRESSION:Soft tissue swelling and stranding. No subcutaneous gas or acute osseous abnormality identified about the left tibNguyenfib. oOf note there was a question of Coumadin induced skin necrosis and has been on Coumadin since the late 90's. Last hemoglobin A1c was 6.7. 10/26/2015 -- arterial and venous duplex studies were done but report is still pending Electronic Signature(s) Signed: 10/26/2015 1:20:03 PM By: Christin Fudge MD, FACS Previous Signature: 10/26/2015 1:19:20 PM Version By: Christin Fudge MD, FACS Entered By: Christin Fudge on 10/26/2015 13:20:03 [name], [name] ([id number]) -------------------------------------------------------------------------------- Physical Exam Details Patient Name: [name] Date of Service: 10/26/2015 12:45 PM Medical Record Number: [id number] Patient Account  Number: [id number] Date of Birth/Sex: 11/13/1940 (74 y.o. Male) Treating RN: Ahmed Prima Primary Care Physician: Jani Gravel Other Clinician: Referring Physician: Jani Gravel Treating Physician/Extender: Frann Rider in Treatment: 1 Constitutional . Pulse regular. Respirations normal and unlabored. Afebrile. . Eyes Nonicteric. Reactive to light. Ears, Nose,  Mouth, and Throat Lips, teeth, and gums WNL.[name] Moist mucosa without lesions. Neck supple and nontender. No palpable supraclavicular or cervical adenopathy. Normal sized without goiter. Respiratory WNL. No retractions.. Breath sounds WNL, No rubs, rales, rhonchi, or wheeze.. Cardiovascular Heart rhythm and rate regular, no murmur or gallop.. Pedal Pulses WNL. No clubbing, cyanosis or edema. Lymphatic No adneopathy. No adenopathy. No adenopathy. Musculoskeletal Adexa without tenderness or enlargement.. Digits and nails w/o clubbing, cyanosis, infection, petechiae, ischemia, or inflammatory conditions.. Integumentary (Hair, Skin) No suspicious lesions. No crepitus or fluctuance. No periNguyenwound warmth or erythema. No masses.[name] Psychiatric Judgement and insight Intact.. No evidence of depression, anxiety, or agitation.. Notes the wound continues to have undermining between the 9 and 12:00 position and this is approximately 2 cm in depth. No sharp debridement was done today. Electronic Signature(s) Signed: 10/26/2015 1:20:49 PM By: Christin Fudge MD, FACS Entered By: Christin Fudge on 10/26/2015 13:20:49 [name], [name] ([id number]) -------------------------------------------------------------------------------- Physician Orders Details Patient Name: [name] Date of Service: 10/26/2015 12:45 PM Medical Record Number: [id number] Patient Account Number: [id number] Date of Birth/Sex: [name] (75 y.o. Male) Treating RN: Baruch Gouty, RN, BSN, Velva Harman Primary Care Physician: Jani Gravel Other Clinician: Referring Physician: Jani Gravel Treating Physician/Extender: Frann Rider in Treatment: 1 Verbal / Phone Orders: Yes Clinician: Afful, RN, BSN, Rita Read Back and Verified: Yes Diagnosis Coding Wound Cleansing Wound #1 Left,Anterior Lower Leg o Clean wound with wound cleanser. o Cleanse wound with mild soap and water o May Shower, gently pat wound dry prior to applying new dressing. Anesthetic Wound #1 Left,Anterior Lower Leg o Topical Lidocaine 4% cream applied to wound bed prior to debridement Primary Wound Dressing Wound #1 Left,Anterior Lower Leg o Santyl Ointment o Plain packing gauze - pack into the undermining of wound lightly Secondary Dressing Wound #1 Left,Anterior Lower Leg o Contact Layer o Dry Gauze o Conform/Kerlix - stretch netting #4, tape Dressing Change Frequency Wound #1 Left,Anterior Lower Leg o Change dressing every day. FollowNguyenup Appointments Wound #1 Left,Anterior Lower Leg o Return Appointment in 1 week. Edema Control Wound #1 Left,Anterior Lower Leg o Elevate legs to the level of the heart and pump ankles as often as possible Additional Orders / Instructions Wound #1 Left,Anterior Lower Leg [name], [name]. ([id number]) o Increase protein intake. Home Health Wound #1 [place] Joshua - [place] Nurse may visit PRN to address patientos wound care needs. o FACE TO FACE ENCOUNTER: MEDICARE and MEDICAID PATIENTS: I certify that this patient is under my care and that I had a faceNguyentoNguyenface encounter that meets the physician faceNguyentoNguyenface encounter requirements with this patient on this date. The encounter with the patient was in whole or in part for the following MEDICAL CONDITION: (primary reason for [place]) MEDICAL NECESSITY: I certify, that based on my findings, NURSING services are a medically necessary home health service. HOME BOUND STATUS: I certify that my clinical  findings support that this patient is homebound (i.e., Due to illness or injury, pt requires aid of supportive devices such as crutches, cane, wheelchairs, walkers, the use of special transportation or the assistance of another person to leave their place of residence. There is a normal inability to leave the home and doing so requires considerable and taxing effort. Other absences are for medical reasons / religious services and are infrequent or of short duration when for other reasons). o If current dressing causes regression in wound condition, may D/C ordered dressing product/s and  apply Normal Saline Moist Dressing daily until next [place] / Other MD appointment. [place] of regression in wound condition at [name]. o Please direct any NONNguyenWOUND related issues/requests for orders to patient's Primary Care Physician MedicationsNguyenplease add to medication list. Wound #1 Left,Anterior Lower Leg o Santyl Enzymatic Ointment Electronic Signature(s) Signed: 10/26/2015 4:26:59 PM By: Christin Fudge MD, FACS Signed: 10/26/2015 5:31:48 PM By: Regan Lemming BSN, RN Entered By: Regan Lemming on 10/26/2015 13:17:34 [name], [name] Kitchen ([id number]) -------------------------------------------------------------------------------- Problem List Details Patient Name: [name] Date of Service: 10/26/2015 12:45 PM Medical Record Number: [id number] Patient Account Number: [id number] Date of Birth/Sex: 01NguyenDecNguyen1942 (75 y.o. 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 Male) Treating RN: Ahmed Prima Primary Care Physician: Jani Gravel Other Clinician: Referring Physician: Jani Gravel Treating Physician/Extender: Frann Rider in Treatment: 1 Subjective Chief Complaint Information obtained from Patient Patients presents for treatment of an open diabetic ulcer left lower extremity with swelling, cellulitis and ulceration for about a month History of Present Illness (HPI) The following HPI elements were documented for the patient's wound: Location: left lower extremity Quality: Patient reports experiencing a dull pain to affected area(s). Severity: Patient states wound are getting worse. Duration: Patient has had the wound for < 4 weeks prior to presenting for treatment Timing: Pain in wound is Intermittent (comes and goes Context: The wound appeared gradually over time Modifying Factors: Other treatment(s) tried include:hospital admission with IV antibiotics and workup Associated Signs and Symptoms: Patient reports having increase swelling. 75 year old gentleman who was been having his primary care taken care of by Dr. Maudie Mercury at [facility] has had recurrent cellulitis the left lower extremity and so far has had 3 attacks. First started in August of this year and was admitted to the ER and sent home with Keflex and. He saw his primary care doctor on August 9 and was referred to [facility] where they began intravenous antibiotics and  was admitted to August 15. He at present on doxycycline 100 mg twice a day. 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 Never been a smoker. During the admission he had a ultrasound done to look for a abscess and none was found except for subcutaneous edema and there was no focal muscle fluid collection. Xray of the left lower extremity --IMPRESSION:Soft tissue swelling and stranding. No subcutaneous gas or acute osseous abnormality identified about the left tibNguyenfib. Of note there was a question of Coumadin induced skin  necrosis and has been on Coumadin since the late 90's. Last hemoglobin A1c was 6.7. 10/26/2015 -- arterial and venous duplex studies were done but report is still pending [name], [name]. ([id number]) Objective Constitutional Pulse regular. Respirations normal and unlabored. Afebrile. Vitals Time Taken: 12:53 PM, Height: 70 in, Weight: 165.7 lbs, BMI: 23.8, Temperature: 97.9 F, Pulse: 67 bpm, Respiratory Rate: 16 breaths/min, Blood Pressure: 161/60 mmHg. Eyes Nonicteric. Reactive to light. Ears, Nose, Mouth, and Throat Lips, teeth, and gums WNL.[name] Moist mucosa without lesions. Neck supple and nontender. No palpable supraclavicular or cervical adenopathy. Normal sized without goiter. Respiratory WNL. No retractions.. Breath sounds WNL, No rubs, rales, rhonchi, or wheeze.. Cardiovascular Heart rhythm and rate regular, no murmur or gallop.. Pedal Pulses WNL. No clubbing, cyanosis or edema. Lymphatic No adneopathy. No adenopathy. No  adenopathy. Musculoskeletal Adexa without tenderness or enlargement.. Digits and nails w/o clubbing, cyanosis, infection, petechiae, ischemia, or inflammatory conditions.[name] Psychiatric Judgement and insight Intact.. No evidence of depression, anxiety, or agitation.. General Notes: the wound continues to have undermining between the 9 and 12:00 position and this is approximately 2 cm in depth. No sharp debridement was done today. Integumentary (Hair, Skin) No suspicious lesions. No crepitus or fluctuance. No periNguyenwound warmth or erythema. No masses.. Wound #1 status is Open. Original cause of wound was Gradually Appeared. The wound is located on the Left,Anterior Lower Leg. The wound measures 2.1cm length x 1.5cm width x 0.3cm depth; 2.474cm^2 area and 0.742cm^3 volume. The wound is limited to skin breakdown. There is no tunneling noted, however, there is undermining starting at 11:00 and ending at 12:00 with a maximum distance of 1.8cm. There is a large amount of serosanguineous drainage noted. The wound margin is distinct with the outline attached to the wound base. There is no granulation within the wound bed. There is a large (67[name]100%) amount of [name], [name]. ([id number]) necrotic tissue within the wound bed including Eschar and Adherent Slough. The periwound skin appearance exhibited: Localized Edema, Moist. Periwound temperature was noted as No Abnormality. The periwound has tenderness on palpation. 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 Cleanse wound with mild soap and water May Shower, gently pat wound dry prior to applying new dressing. Anesthetic: Wound #1 Left,Anterior Lower Leg: Topical Lidocaine 4% cream applied  to wound bed prior to debridement Primary Wound Dressing: Wound #1 Left,Anterior Lower Leg: Santyl Ointment Plain packing gauze - pack into the undermining of wound lightly Secondary Dressing: Wound #1 Left,Anterior Lower Leg: Contact Layer Dry Gauze Conform/Kerlix - stretch netting #4, tape Dressing Change Frequency: Wound #1 Left,Anterior Lower Leg: Change dressing every day. FollowNguyenup Appointments: Wound #1 Left,Anterior Lower Leg: Return Appointment in 1 week. Edema Control: Wound #1 Left,Anterior Lower Leg: Elevate legs to the level of the heart and pump ankles as often as possible Additional Orders / Instructions: [name], [name]. ([id number]) Wound #1 Left,Anterior Lower Leg: Increase protein intake. Home Health: Wound #1 Left,Anterior Lower Leg: [name] Joshua - [place] Nurse may visit PRN to address patient s wound care needs. FACE TO FACE ENCOUNTER: MEDICARE and MEDICAID PATIENTS: I certify that this patient is under my care and that I had a faceNguyentoNguyenface encounter that meets the physician faceNguyentoNguyenface encounter requirements with this patient on this date. The  encounter with the patient was in whole or in part for the following MEDICAL CONDITION: (primary reason for [place]) MEDICAL NECESSITY: I certify, that based on my findings, NURSING services are a medically necessary home health service. HOME BOUND STATUS: I certify that my clinical findings support that this patient is homebound (i.e., Due to illness or injury, pt requires aid of supportive devices such as crutches, cane, wheelchairs, walkers, the use of special transportation or the assistance of another person to leave their place of residence. There is a normal inability to leave the home and doing so requires considerable and taxing effort. Other absences are for medical reasons / religious services and are infrequent or of short duration when for other reasons). If current  dressing causes regression in wound condition, may D/C ordered dressing product/s and apply Normal Saline Moist Dressing daily until next [place] / Other MD appointment. [place] of regression in wound condition at [name]. Please direct any NONNguyenWOUND related issues/requests for orders to patient's Primary Care Physician MedicationsNguyenplease add to medication list.: Wound #1 Left,Anterior Lower Leg: Santyl Enzymatic Ointment I have recommended: 1. Santyl ointment locally to be placed with some packing gauze. 2. light compression stockings 20[name]30 mm of Hg to be worn all day 3. Venous duplex studies to look for reflux. study done and reports pending 4. Arterial duplex study -- study done and reports pending 5. regular Joshua to the wound center He and his wife have had all QUESTIONS answered and will be compliant Electronic Signature(s) Signed: 10/26/2015 4:29:37 PM By: Christin Fudge MD, FACS Previous Signature: 10/26/2015 1:21:48 PM Version By: Christin Fudge MD, FACS Entered By: Christin Fudge on 10/26/2015 16:29:36 [place], [name] ([id number]) -------------------------------------------------------------------------------- SuperBill Details Patient Name: [name] Date of Service: 10/26/2015 Medical Record Number: [id number] Patient Account Number: [id number] Date of Birth/Sex: June 09, 1940 (75 y.o. 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 I89.0 Lymphedema, not elsewhere classified Facility Procedures CPT4 Code: [id number] Description: [name] - WOUND CARE VISITNguyenLEV 2 EST PT Modifier: Quantity: 1 Physician Procedures CPT4 Code:  [id number] Description: 11886 - WC PHYS LEVEL 3 - EST PT ICDNguyen10 Description Diagnosis E11.622 Type 2 diabetes mellitus with other skin ulcer L97.222 NonNguyenpressure chronic ulcer of left calf with fat I89.0 Lymphedema, not elsewhere classified Modifier: layer exposed Quantity: 1 Electronic Signature(s) Signed: 10/26/2015 4:29:50 PM By: Christin Fudge MD, FACS Previous Signature: 10/26/2015 1:22:02 PM Version By: Christin Fudge MD, FACS Entered By: Christin Fudge on 10/26/2015 16:29:50

## 2015-10-27 NOTE — Progress Notes (Signed)
Joshua, [name] ([id number]) Visit Report for 10/26/2015 Arrival Information Details Patient Name: [name], [name] Date of Service: 10/26/2015 12:45 PM Medical Record Number: [id number] Patient Account Number: [id number] Date of Birth/Sex: 1940/06/03 (75 y.o. Male) Treating RN: Ahmed Prima Primary Care Physician: Jani Gravel Other Clinician: Referring Physician: Jani Gravel Treating Physician/Extender: Frann Rider in Treatment: 1 Visit Information History Since Last Visit All ordered tests and consults were completed: No Patient Arrived: Ambulatory Added or deleted any medications: No Arrival Time: 12:52 Any new allergies or adverse reactions: No Accompanied By: wife Had a fall or experienced change in No Transfer Assistance: None activities of daily living that may affect Patient Identification Verified: Yes risk of falls: Secondary Verification Process Yes Signs or symptoms of abuse/neglect since last No Completed: visito Patient Requires No Hospitalized since last visit: No Transmission-Based Pain Present Now: No Precautions: Patient Has Alerts: Yes Patient Alerts: Patient on Blood Thinner Warfarin DM II ABIs noncompressible Electronic Signature(s) Signed: 10/26/2015 5:34:49 PM By: Alric Quan Entered By: Alric Quan on 10/26/2015 12:53:24 Pennock, Sherron RMarland Kitchen ([id number]) -------------------------------------------------------------------------------- Clinic Level of Care Assessment Details Patient Name: [name] Date of Service: 10/26/2015 12:45 PM Medical Record Number: [id number] Patient Account Number: [id number] Date of Birth/Sex: March 05, 1940 (75 y.o. Male) Treating RN: Baruch Gouty, RN, BSN, [place] Primary Care Physician: Jani Gravel Other Clinician: Referring Physician: Jani Gravel Treating Physician/Extender: Frann Rider in Treatment: 1 Clinic Level of Care Assessment Items TOOL 4 Quantity Score []  - Use when only an EandM is  performed on FOLLOW-UP visit 0 ASSESSMENTS - Nursing Assessment / Reassessment X - Reassessment of Co-morbidities (includes updates in patient status) 1 10 X - Reassessment of Adherence to Treatment Plan 1 5 ASSESSMENTS - Wound and Skin Assessment / Reassessment []  - Simple Wound Assessment / Reassessment - one wound 0 []  - Complex Wound Assessment / Reassessment - multiple wounds 0 []  - Dermatologic / Skin Assessment (not related to wound area) 0 ASSESSMENTS - Focused Assessment []  - Circumferential Edema Measurements - multi extremities 0 []  - Nutritional Assessment / Counseling / Intervention 0 X - Lower Extremity Assessment (monofilament, tuning fork, pulses) 1 5 []  - Peripheral Arterial Disease Assessment (using hand held doppler) 0 ASSESSMENTS - Ostomy and/or Continence Assessment and Care []  - Incontinence Assessment and Management 0 []  - Ostomy Care Assessment and Management (repouching, etc.) 0 PROCESS - Coordination of Care X - Simple Patient / Family Education for ongoing care 1 15 []  - Complex (extensive) Patient / Family Education for ongoing care 0 []  - Staff obtains Programmer, systems, Records, Test Results / Process Orders 0 []  - Staff telephones HHA, Nursing Homes / Clarify orders / etc 0 []  - Routine Transfer to another Facility (non-emergent condition) 0 Roller, Trygg R. ([id number]) []  - Routine Hospital Admission (non-emergent condition) 0 []  - New Admissions / Biomedical engineer / Ordering NPWT, Apligraf, etc. 0 []  - Emergency Hospital Admission (emergent condition) 0 []  - Simple Discharge Coordination 0 []  - Complex (extensive) Discharge Coordination 0 PROCESS - Special Needs []  - Pediatric / Minor Patient Management 0 []  - Isolation Patient Management 0 []  - Hearing / Language / Visual special needs 0 []  - Assessment of Community assistance (transportation, D/C planning, etc.) 0 []  - Additional assistance / Altered mentation 0 []  - Support Surface(s) Assessment  (bed, cushion, seat, etc.) 0 INTERVENTIONS - Wound Cleansing / Measurement X - Simple Wound Cleansing - one wound 1 5 []  - Complex Wound Cleansing - multiple wounds 0 X -  Wound Imaging (photographs - any number of wounds) 1 5 []  - Wound Tracing (instead of photographs) 0 X - Simple Wound Measurement - one wound 1 5 []  - Complex Wound Measurement - multiple wounds 0 INTERVENTIONS - Wound Dressings X - Small Wound Dressing one or multiple wounds 1 10 []  - Medium Wound Dressing one or multiple wounds 0 []  - Large Wound Dressing one or multiple wounds 0 []  - Application of Medications - topical 0 []  - Application of Medications - injection 0 INTERVENTIONS - Miscellaneous []  - External ear exam 0 Ritzel, Granvil R. ([id number]) []  - Specimen Collection (cultures, biopsies, blood, body fluids, etc.) 0 []  - Specimen(s) / Culture(s) sent or taken to Lab for analysis 0 []  - Patient Transfer (multiple staff / [name] / Similar devices) 0 []  - Simple Staple / Suture removal (25 or less) 0 []  - Complex Staple / Suture removal (26 or more) 0 []  - Hypo / Hyperglycemic Management (close monitor of Blood Glucose) 0 []  - Ankle / Brachial Index (ABI) - do not check if billed separately 0 X - Vital Signs 1 5 Has the patient been seen at the hospital within the last three years: Yes Total Score: 65 Level Of Care: New/Established - Level 2 Electronic Signature(s) Signed: 10/26/2015 5:31:48 PM By: Regan Lemming BSN, RN Entered By: Regan Lemming on 10/26/2015 13:18:02 [place], [name] ([id number]) -------------------------------------------------------------------------------- Encounter Discharge Information Details Patient Name: [name] Date of Service: 10/26/2015 12:45 PM Medical Record Number: [id number] Patient Account Number: [id number] Date of Birth/Sex: [birth year] (75 y.o. Male) Treating RN: Ahmed Prima Primary Care Physician: Jani Gravel Other Clinician: Referring Physician: Jani Gravel Treating Physician/Extender: Frann Rider in Treatment: 1 Encounter Discharge Information Items Discharge Pain Level: 0 Discharge Condition: Stable Ambulatory Status: Ambulatory Discharge Destination: Home Transportation: Private Auto Accompanied By: wife Schedule Follow-up Appointment: Yes Medication Reconciliation completed and provided to Patient/Care Yes Munachimso Palin: Provided on Clinical Summary of Care: 10/26/2015 Form Type Recipient Paper Patient BP Electronic Signature(s) Signed: 10/26/2015 1:33:21 PM By: Ruthine Dose Entered By: Ruthine Dose on 10/26/2015 13:33:21 Eliasen, Ad RMarland Kitchen ([id number]) -------------------------------------------------------------------------------- Lower Extremity Assessment Details Patient Name: [name] Date of Service: 10/26/2015 12:45 PM Medical Record Number: [id number] Patient Account Number: [id number] Date of Birth/Sex: Apr 18, 1940 (75 y.o. Male) Treating RN: Ahmed Prima Primary Care Physician: Jani Gravel Other Clinician: Referring Physician: Jani Gravel Treating Physician/Extender: Frann Rider in Treatment: 1 Vascular Assessment Pulses: Posterior Tibial Dorsalis Pedis Palpable: [Right:Yes] Extremity colors, hair growth, and conditions: Extremity Color: [Right:Hyperpigmented] Temperature of Extremity: [Right:Warm] Capillary Refill: [Right:< 3 seconds] Electronic Signature(s) Signed: 10/26/2015 5:34:49 PM By: Alric Quan Entered By: Alric Quan on 10/26/2015 13:02:40 [place], Leeam RMarland Kitchen ([id number]) -------------------------------------------------------------------------------- Multi Wound Chart Details Patient Name: [name] Date of Service: 10/26/2015 12:45 PM Medical Record Number: [id number] Patient Account Number: [id number] Date of Birth/Sex: Feb 11, 1941 (75 y.o. Male) Treating RN: Baruch Gouty, RN, BSN, [place] Primary Care Physician: Jani Gravel Other Clinician: Referring Physician: Jani Gravel Treating Physician/Extender: Frann Rider in Treatment: 1 Vital Signs Height(in): 70 Pulse(bpm): 67 Weight(lbs): 165.7 Blood Pressure 161/60 (mmHg): Body Mass Index(BMI): 24 Temperature(F): 97.9 Respiratory Rate 16 (breaths/min): Photos: [1:No Photos] [N/A:N/A] Wound Location: [1:Left Lower Leg - Anterior] [N/A:N/A] Wounding Event: [1:Gradually Appeared] [N/A:N/A] Primary Etiology: [1:Diabetic Wound/Ulcer of the Lower Extremity] [N/A:N/A] Comorbid History: [1:Cataracts, Anemia, Hypertension, Type II Diabetes, Osteoarthritis] [N/A:N/A] Date Acquired: [1:09/21/2015] [N/A:N/A] Weeks of Treatment: [1:1] [N/A:N/A] Wound Status: [1:Open] [N/A:N/A] Measurements L x W x D 2.1x1.5x0.3 [N/A:N/A] (  cm) Area (cm) : [1:2.474] [N/A:N/A] Volume (cm) : [1:0.742] [N/A:N/A] % Reduction in Area: [1:0.00%] [N/A:N/A] % Reduction in Volume: -200.40% [N/A:N/A] Classification: [1:Grade 1] [N/A:N/A] Exudate Amount: [1:Large] [N/A:N/A] Exudate Type: [1:Serosanguineous] [N/A:N/A] Exudate Color: [1:red, brown] [N/A:N/A] Wound Margin: [1:Distinct, outline attached] [N/A:N/A] Granulation Amount: [1:None Present (0%)] [N/A:N/A] Necrotic Amount: [1:Large (67-100%)] [N/A:N/A] Necrotic Tissue: [1:Eschar, Adherent Slough] [N/A:N/A] Exposed Structures: [1:Fascia: No Fat: No Tendon: No Muscle: No Joint: No] [N/A:N/A] Bone: No Limited to Skin Breakdown Epithelialization: None N/A N/A Periwound Skin Texture: Edema: Yes N/A N/A Periwound Skin Moist: Yes N/A N/A Moisture: Periwound Skin Color: No Abnormalities Noted N/A N/A Temperature: No Abnormality N/A N/A Tenderness on Yes N/A N/A Palpation: Wound Preparation: Ulcer Cleansing: N/A N/A Rinsed/Irrigated with Saline Topical Anesthetic Applied: Other: lidocaine 4% Treatment Notes Electronic Signature(s) Signed: 10/26/2015 5:31:48 PM By: Regan Lemming BSN, RN Entered By: Regan Lemming on 10/26/2015 13:15:41 [place], [name]  ([id number]) -------------------------------------------------------------------------------- [place] Details Patient Name: [name] Date of Service: 10/26/2015 12:45 PM Medical Record Number: [id number] Patient Account Number: [id number] Date of Birth/Sex: 12-30-1940 (75 y.o. Male) Treating RN: Baruch Gouty, RN, BSN, [place] Primary Care Physician: Jani Gravel Other Clinician: Referring Physician: Jani Gravel Treating Physician/Extender: Frann Rider in Treatment: 1 Active Inactive Abuse / Safety / Falls / Self Care Management Nursing Diagnoses: Potential for falls Goals: Patient will remain injury free Date Initiated: 10/16/2015 Goal Status: Active Interventions: Assess fall risk on admission and as needed Notes: Nutrition Nursing Diagnoses: Imbalanced nutrition Impaired glucose control: actual or potential Goals: Patient/caregiver agrees to and verbalizes understanding of need to use nutritional supplements and/or vitamins as prescribed Date Initiated: 10/16/2015 Goal Status: Active Patient/caregiver verbalizes understanding of need to maintain therapeutic glucose control per primary care physician Date Initiated: 10/16/2015 Goal Status: Active Interventions: Assess patient nutrition upon admission and as needed per policy Provide education on elevated blood sugars and impact on wound healing Notes: Wyman, Joushua R. ([id number]) Orientation to the Wound Care Program Nursing Diagnoses: Knowledge deficit related to the wound healing center program Goals: Patient/caregiver will verbalize understanding of the [place] Date Initiated: 10/16/2015 Goal Status: Active Interventions: Provide education on orientation to the wound center Notes: Pain, Acute or Chronic Nursing Diagnoses: Pain, acute or chronic: actual or potential Potential alteration in comfort, pain Goals: Patient will verbalize adequate pain control and receive  pain control interventions during procedures as needed Date Initiated: 10/16/2015 Goal Status: Active Interventions: Assess comfort goal upon admission Complete pain assessment as per visit requirements Notes: Wound/Skin Impairment Nursing Diagnoses: Impaired tissue integrity Goals: Ulcer/skin breakdown will have a volume reduction of 30% by week 4 Date Initiated: 10/16/2015 Goal Status: Active Ulcer/skin breakdown will have a volume reduction of 50% by week 8 Date Initiated: 10/16/2015 Goal Status: Active Ulcer/skin breakdown will have a volume reduction of 80% by week 12 Date Initiated: 10/16/2015 EWART, CARRERA ([id number]) Goal Status: Active Interventions: Assess patient/caregiver ability to perform ulcer/skin care regimen upon admission and as needed Assess ulceration(s) every visit Notes: Electronic Signature(s) Signed: 10/26/2015 5:31:48 PM By: Regan Lemming BSN, RN Entered By: Regan Lemming on 10/26/2015 13:15:10 Sollenberger, [name] ([id number]) -------------------------------------------------------------------------------- Pain Assessment Details Patient Name: [name] Date of Service: 10/26/2015 12:45 PM Medical Record Number: [id number] Patient Account Number: [id number] Date of Birth/Sex: 1940-09-12 (75 y.o. Male) Treating RN: Ahmed Prima Primary Care Physician: Jani Gravel Other Clinician: Referring Physician: Jani Gravel Treating Physician/Extender: Frann Rider in Treatment: 1 Active Problems Location of Pain Severity and  Description of Pain Patient Has Paino No Site Locations With Dressing Change: No Pain Management and Medication Current Pain Management: Electronic Signature(s) Signed: 10/26/2015 5:34:49 PM By: Alric Quan Entered By: Alric Quan on 10/26/2015 12:53:30 [place], [name] ([id number]) -------------------------------------------------------------------------------- Patient/Caregiver Education Details Patient Name:  [name] Date of Service: 10/26/2015 12:45 PM Medical Record Number: [id number] Patient Account Number: [id number] Date of Birth/Gender: Jan 24, 1941 (75 y.o. Male) Treating RN: Ahmed Prima Primary Care Physician: Jani Gravel Other Clinician: Referring Physician: Jani Gravel Treating Physician/Extender: Frann Rider in Treatment: 1 Education Assessment Education Provided To: Patient Education Topics Provided Wound/Skin Impairment: Handouts: Other: change dressing as ordered Methods: Demonstration, Explain/Verbal Responses: State content correctly Electronic Signature(s) Signed: 10/26/2015 5:34:49 PM By: Alric Quan Entered By: Alric Quan on 10/26/2015 13:05:01 [place], [name] ([id number]) -------------------------------------------------------------------------------- Wound Assessment Details Patient Name: [name] Date of Service: 10/26/2015 12:45 PM Medical Record Number: [id number] Patient Account Number: [id number] Date of Birth/Sex: 1940/09/14 (75 y.o. Male) Treating RN: Ahmed Prima Primary Care Physician: Jani Gravel Other Clinician: Referring Physician: Jani Gravel Treating Physician/Extender: Frann Rider in Treatment: 1 Wound Status Wound Number: 1 Primary Diabetic Wound/Ulcer of the Lower Etiology: Extremity Wound Location: Left Lower Leg - Anterior Wound Open Wounding Event: Gradually Appeared Status: Date Acquired: 09/21/2015 Comorbid Cataracts, Anemia, Hypertension, Weeks Of Treatment: 1 History: Type II Diabetes, Osteoarthritis Clustered Wound: No Photos Photo Uploaded By: Alric Quan on 10/26/2015 14:43:00 Wound Measurements Length: (cm) 2.1 Width: (cm) 1.5 Depth: (cm) 0.3 Area: (cm) 2.474 Volume: (cm) 0.742 % Reduction in Area: 0% % Reduction in Volume: -200.4% Epithelialization: None Tunneling: No Undermining: Yes Starting Position (o'clock): 11 Ending Position (o'clock): 12 Maximum Distance:  (cm) 1.8 Wound Description Classification: Grade 1 Wound Margin: Distinct, outline attached Exudate Amount: Large Exudate Type: Serosanguineous Exudate Color: red, brown Foul Odor After Cleansing: No Wound Bed Weigel, Ameir R. ([id number]) Granulation Amount: None Present (0%) Exposed Structure Necrotic Amount: Large (67-100%) Fascia Exposed: No Necrotic Quality: Eschar, Adherent Slough Fat Layer Exposed: No Tendon Exposed: No Muscle Exposed: No Joint Exposed: No Bone Exposed: No Limited to Skin Breakdown Periwound Skin Texture Texture Color No Abnormalities Noted: No No Abnormalities Noted: No Localized Edema: Yes Temperature / Pain Moisture Temperature: No Abnormality No Abnormalities Noted: No Tenderness on Palpation: Yes Moist: Yes Wound Preparation Ulcer Cleansing: Rinsed/Irrigated with Saline Topical Anesthetic Applied: Other: lidocaine 4%, Treatment Notes Wound #1 (Left, Anterior Lower Leg) 1. Cleansed with: Clean wound with Normal Saline 2. Anesthetic Topical Lidocaine 4% cream to wound bed prior to debridement 4. Dressing Applied: Santyl Ointment Plain packing gauze 5. Secondary Dressing Applied ABD Pad Dry Gauze Kerlix/Conform 7. Secured with Tape Notes netting Electronic Signature(s) Signed: 10/26/2015 5:34:49 PM By: Alric Quan Entered By: Alric Quan on 10/26/2015 13:32:30 Dhillon, Quran RMarland Kitchen ([id number]) -------------------------------------------------------------------------------- Vitals Details Patient Name: [name] Date of Service: 10/26/2015 12:45 PM Medical Record Number: [id number] Patient Account Number: [id number] Date of Birth/Sex: 10-21-1940 (75 y.o. Male) Treating RN: Ahmed Prima Primary Care Physician: Jani Gravel Other Clinician: Referring Physician: Jani Gravel Treating Physician/Extender: Frann Rider in Treatment: 1 Vital Signs Time Taken: 12:53 Temperature (F): 97.9 Height (in): 70 Pulse  (bpm): 67 Weight (lbs): 165.7 Respiratory Rate (breaths/min): 16 Body Mass Index (BMI): 23.8 Blood Pressure (mmHg): 161/60 Reference Range: 80 - 120 mg / dl Electronic Signature(s) Signed: 10/26/2015 5:34:49 PM By: Alric Quan Entered By: Alric Quan on 10/26/2015 12:55:14

## 2015-10-31 DIAGNOSIS — S81809A Unspecified open wound, unspecified lower leg, initial encounter: Secondary | ICD-10-CM | POA: Diagnosis not present

## 2015-10-31 DIAGNOSIS — L03116 Cellulitis of left lower limb: Secondary | ICD-10-CM | POA: Diagnosis not present

## 2015-10-31 DIAGNOSIS — B9562 Methicillin resistant Staphylococcus aureus infection as the cause of diseases classified elsewhere: Secondary | ICD-10-CM | POA: Diagnosis not present

## 2015-11-01 DIAGNOSIS — Z7984 Long term (current) use of oral hypoglycemic drugs: Secondary | ICD-10-CM | POA: Diagnosis not present

## 2015-11-01 DIAGNOSIS — Z5181 Encounter for therapeutic drug level monitoring: Secondary | ICD-10-CM | POA: Diagnosis not present

## 2015-11-01 DIAGNOSIS — Z7901 Long term (current) use of anticoagulants: Secondary | ICD-10-CM | POA: Diagnosis not present

## 2015-11-01 DIAGNOSIS — R238 Other skin changes: Secondary | ICD-10-CM | POA: Diagnosis not present

## 2015-11-01 DIAGNOSIS — E119 Type 2 diabetes mellitus without complications: Secondary | ICD-10-CM | POA: Diagnosis not present

## 2015-11-01 DIAGNOSIS — Z79891 Long term (current) use of opiate analgesic: Secondary | ICD-10-CM | POA: Diagnosis not present

## 2015-11-01 DIAGNOSIS — K7460 Unspecified cirrhosis of liver: Secondary | ICD-10-CM | POA: Diagnosis not present

## 2015-11-01 DIAGNOSIS — L03116 Cellulitis of left lower limb: Secondary | ICD-10-CM | POA: Diagnosis not present

## 2015-11-01 DIAGNOSIS — Z792 Long term (current) use of antibiotics: Secondary | ICD-10-CM | POA: Diagnosis not present

## 2015-11-01 DIAGNOSIS — D693 Immune thrombocytopenic purpura: Secondary | ICD-10-CM | POA: Diagnosis not present

## 2015-11-01 DIAGNOSIS — I10 Essential (primary) hypertension: Secondary | ICD-10-CM | POA: Diagnosis not present

## 2015-11-01 DIAGNOSIS — D649 Anemia, unspecified: Secondary | ICD-10-CM | POA: Diagnosis not present

## 2015-11-02 ENCOUNTER — Encounter: Payer: Medicare Other | Admitting: Surgery

## 2015-11-02 DIAGNOSIS — I890 Lymphedema, not elsewhere classified: Secondary | ICD-10-CM | POA: Diagnosis not present

## 2015-11-02 DIAGNOSIS — Z79899 Other long term (current) drug therapy: Secondary | ICD-10-CM | POA: Diagnosis not present

## 2015-11-02 DIAGNOSIS — Z7984 Long term (current) use of oral hypoglycemic drugs: Secondary | ICD-10-CM | POA: Diagnosis not present

## 2015-11-02 DIAGNOSIS — L97821 Non-pressure chronic ulcer of other part of left lower leg limited to breakdown of skin: Secondary | ICD-10-CM | POA: Diagnosis not present

## 2015-11-02 DIAGNOSIS — Z86718 Personal history of other venous thrombosis and embolism: Secondary | ICD-10-CM | POA: Diagnosis not present

## 2015-11-02 DIAGNOSIS — L97222 Non-pressure chronic ulcer of left calf with fat layer exposed: Secondary | ICD-10-CM | POA: Diagnosis not present

## 2015-11-02 DIAGNOSIS — M1990 Unspecified osteoarthritis, unspecified site: Secondary | ICD-10-CM | POA: Diagnosis not present

## 2015-11-02 DIAGNOSIS — E785 Hyperlipidemia, unspecified: Secondary | ICD-10-CM | POA: Diagnosis not present

## 2015-11-02 DIAGNOSIS — I10 Essential (primary) hypertension: Secondary | ICD-10-CM | POA: Diagnosis not present

## 2015-11-02 DIAGNOSIS — D649 Anemia, unspecified: Secondary | ICD-10-CM | POA: Diagnosis not present

## 2015-11-02 DIAGNOSIS — E11622 Type 2 diabetes mellitus with other skin ulcer: Secondary | ICD-10-CM | POA: Diagnosis not present

## 2015-11-02 DIAGNOSIS — Z8673 Personal history of transient ischemic attack (TIA), and cerebral infarction without residual deficits: Secondary | ICD-10-CM | POA: Diagnosis not present

## 2015-11-02 DIAGNOSIS — I6523 Occlusion and stenosis of bilateral carotid arteries: Secondary | ICD-10-CM | POA: Diagnosis not present

## 2015-11-02 DIAGNOSIS — Z7901 Long term (current) use of anticoagulants: Secondary | ICD-10-CM | POA: Diagnosis not present

## 2015-11-02 NOTE — Progress Notes (Addendum)
[name], Joshua ([id number]) Visit Report for 11/02/2015 Chief Complaint Document Details Patient Name: [name], [name] Date of Service: 11/02/2015 1:45 PM Medical Record Number: [id number] Patient Account Number: [id number] Date of Birth/Sex: 11-05-1940 (75 y.o. Male) Treating RN: Ahmed Prima Primary Care Physician: Jani Gravel Other Clinician: Referring Physician: Jani Gravel Treating Physician/Extender: Frann Rider in Treatment: 2 Information Obtained from: Patient Chief Complaint Patients presents for treatment of an open diabetic ulcer left lower extremity with swelling, cellulitis and ulceration for about a month Electronic Signature(s) Signed: 11/02/2015 2:59:18 PM By: Christin Fudge MD, FACS Entered By: Christin Fudge on 11/02/2015 14:59:18 [place], Decker. ([id number]) -------------------------------------------------------------------------------- HPI Details Patient Name: [name] Date of Service: 11/02/2015 1:45 PM Medical Record Number: [id number] Patient Account Number: [id number] Date of Birth/Sex: [birth year] (75 y.o. Male) Treating RN: Ahmed Prima Primary Care Physician: Jani Gravel Other Clinician: Referring Physician: Jani Gravel Treating Physician/Extender: Frann Rider in Treatment: 2 History of Present Illness Location: left lower extremity Quality: Patient reports experiencing a dull pain to affected area(s). Severity: Patient states wound are getting worse. Duration: Patient has had the wound for < 4 weeks prior to presenting for treatment Timing: Pain in wound is Intermittent (comes and goes Context: The wound appeared gradually over time Modifying Factors: Other treatment(s) tried include:hospital admission with IV antibiotics and workup Associated Signs and Symptoms: Patient reports having increase swelling. HPI Description: 75 year old gentleman who was been having his primary care taken care of by Dr. Maudie Mercury at [facility] has had recurrent cellulitis the left lower extremity and so far has had 3 attacks. First started in August of this year and was admitted to the ER and sent home with Keflex and. He saw his primary care doctor on August 9 and was referred to [facility] where they began intravenous antibiotics and was admitted to August 15. He at present on doxycycline 100 mg twice a day. 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 Never been a smoker. During the admission he had a ultrasound done to look for a abscess and none was found except for subcutaneous edema and there was no focal muscle fluid collection. Xray of the left lower extremity --IMPRESSION:Soft tissue swelling and stranding. No subcutaneous gas or acute osseous abnormality identified about the left tib-fib. oOf note there was a question of Coumadin induced skin necrosis and has been on Coumadin since the late 90's. Last hemoglobin A1c was 6.7. 10/26/2015 -- arterial and venous duplex studies were done but report is still pending. 11/02/2015 -- a lower extremity venous duplex reflux evaluation -- there was no evidence of DVT or SVT in the left lower extremity. 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 No further Vascular cosultation was recommended for this. Arterial studies done showed he had noncompressible vessels bilaterally and hence ABI was not reliable. The right toe brachial index was 0.56 which was abnormal and the left was 0.81 which was normal. Doppler signals  were triphasic bilaterally both posterior tibial and dorsalis pedis. Electronic Signature(s) RASTUS, BORTON ([id number]) Signed: 11/02/2015 3:00:12 PM By: Christin Fudge MD, FACS Previous Signature: 11/02/2015 1:48:18  PM Version By: Christin Fudge MD, FACS Previous Signature: 11/02/2015 1:47:19 PM Version By: Christin Fudge MD, FACS Previous Signature: 11/02/2015 1:42:44 PM Version By: Christin Fudge MD, FACS Entered By: Christin Fudge on 11/02/2015 15:00:11 SION, REINDERS ([id number]) -------------------------------------------------------------------------------- Physical Exam Details Patient Name: [name] Date of Service: 11/02/2015 1:45 PM Medical Record Number: [id number] Patient Account Number: [id number] Date of Birth/Sex: 1940-09-18 (75 y.o. Male) Treating RN: Ahmed Prima Primary Care Physician: Jani Gravel Other Clinician: Referring Physician: Jani Gravel Treating Physician/Extender: Frann Rider in Treatment: 2 Constitutional . Pulse regular. Respirations normal and unlabored. Afebrile. . Eyes Nonicteric. Reactive to light. Ears, Nose, Mouth, and Throat Lips, teeth, and gums WNL.[name] Moist mucosa without lesions. Neck supple and nontender. No palpable supraclavicular or cervical adenopathy. Normal sized without goiter. Respiratory WNL. No retractions.. Cardiovascular Pedal Pulses WNL. No clubbing, cyanosis or edema. Lymphatic No adneopathy. No adenopathy. No adenopathy. Musculoskeletal Adexa without tenderness or enlargement.. Digits and nails w/o clubbing, cyanosis, infection, petechiae, ischemia, or inflammatory conditions.. Integumentary (Hair, Skin) No suspicious lesions. No crepitus or fluctuance. No peri-wound warmth or erythema. No masses.[name] Psychiatric Judgement and insight Intact.. No evidence of depression, anxiety, or agitation.. Notes the undermining is less today and I was able to be not this wound with a moist saline gauze and no  sharp debridement was required Electronic Signature(s) Signed: 11/02/2015 3:00:57 PM By: Christin Fudge MD, FACS Entered By: Christin Fudge on 11/02/2015 15:00:56 Croy, [name] ([id number]) -------------------------------------------------------------------------------- Physician Orders Details Patient Name: [name] Date of Service: 11/02/2015 1:45 PM Medical Record Number: [id number] Patient Account Number: [id number] Date of Birth/Sex: [birth year] (75 y.o. Male) Treating RN: Baruch Gouty, RN, BSN, Velva Harman Primary Care Physician: Jani Gravel Other Clinician: Referring Physician: Jani Gravel Treating Physician/Extender: Frann Rider in Treatment: 2 Verbal / Phone Orders: Yes Clinician: Afful, RN, BSN, Rita Read Back and Verified: Yes Diagnosis Coding Wound Cleansing Wound #1 Left,Anterior Lower Leg o Clean wound with wound cleanser. o Cleanse wound with mild soap and water o May Shower, gently pat wound dry prior to applying new dressing. Anesthetic Wound #1 Left,Anterior Lower Leg o Topical Lidocaine 4% cream applied to wound bed prior to debridement Primary Wound Dressing Wound #1 Left,Anterior Lower Leg o Santyl Ointment o Plain packing gauze - pack into the undermining of wound lightly Secondary Dressing Wound #1 Left,Anterior Lower Leg o Contact Layer o Dry Gauze o Conform/Kerlix - stretch netting #4, tape Dressing Change Frequency Wound #1 Left,Anterior Lower Leg o Change dressing every day. Follow-up Appointments Wound #1 Left,Anterior Lower Leg o Return Appointment in 1 week. Edema Control Wound #1 Left,Anterior Lower Leg o Elevate legs to the level of the heart and pump ankles as often as possible Additional Orders / Instructions Wound #1 Left,Anterior Lower Leg Helbig, Shakil R. ([id number]) o Increase protein intake. Home Health Wound #1 [place] Visits - [place] Nurse  may visit PRN to address patientos wound care needs. o FACE TO FACE ENCOUNTER: MEDICARE and MEDICAID PATIENTS: I certify that this patient is under my care and that I had a face-to-face encounter that meets the physician face-to-face encounter requirements with this patient on this date. The encounter with the patient was in whole or in part for the following MEDICAL CONDITION: (primary reason for [place]) MEDICAL NECESSITY: I certify, that based on my findings, NURSING services are a medically necessary home health service. HOME BOUND STATUS: I certify that my clinical findings support that  this patient is homebound (i.e., Due to illness or injury, pt requires aid of supportive devices such as crutches, cane, wheelchairs, walkers, the use of special transportation or the assistance of another person to leave their place of residence. There is a normal inability to leave the home and doing so requires considerable and taxing effort. Other absences are for medical reasons / religious services and are infrequent or of short duration when for other reasons). o If current dressing causes regression in wound condition, may D/C ordered dressing product/s and apply Normal Saline Moist Dressing daily until next [place] / Other MD appointment. [place] of regression in wound condition at [phone]. o Please direct any NON-WOUND related issues/requests for orders to patient's Primary Care Physician Medications-please add to medication list. Wound #1 Left,Anterior Lower Leg o Santyl Enzymatic Ointment Electronic Signature(s) Signed: 11/02/2015 3:58:34 PM By: Christin Fudge MD, FACS Signed: 11/02/2015 5:04:09 PM By: Regan Lemming BSN, RN Entered By: Regan Lemming on 11/02/2015 14:12:14 Torain, Haldon RMarland Kitchen ([id number]) -------------------------------------------------------------------------------- Problem List Details Patient Name: [name] Date of  Service: 11/02/2015 1:45 PM Medical Record Number: [id number] Patient Account Number: [id number] Date of Birth/Sex: January 17, 1941 (75 y.o. 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 Male) Treating RN: Ahmed Prima Primary Care Physician: Jani Gravel Other Clinician: Referring Physician: Jani Gravel Treating Physician/Extender: Frann Rider in Treatment: 2 Subjective Chief Complaint Information obtained from Patient Patients presents for treatment of an open diabetic ulcer left lower extremity with swelling, cellulitis and ulceration for about a month History of Present Illness (HPI) The following HPI elements were documented for the patient's wound: Location: left lower extremity Quality: Patient reports experiencing a dull pain to affected area(s). Severity: Patient states wound are getting worse. Duration: Patient has had the wound for < 4 weeks prior to presenting for treatment Timing: Pain in wound is Intermittent (comes and goes Context: The wound appeared gradually over  time Modifying Factors: Other treatment(s) tried include:hospital admission with IV antibiotics and workup Associated Signs and Symptoms: Patient reports having increase swelling. 75 year old gentleman who was been having his primary care taken care of by Dr. Maudie Mercury at [facility] has had recurrent cellulitis the left lower extremity and so far has had 3 attacks. First started in August of this year and was admitted to the ER and sent home with Keflex and. He saw his primary care doctor on August 9 and was referred to [facility] where they began intravenous antibiotics and was admitted to August 15. He at present on doxycycline 100 mg twice a day. 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 Never been a smoker. During the admission he had a ultrasound done to look for a abscess and none was found except for subcutaneous edema and there was no focal muscle fluid collection. Xray of the left lower extremity --IMPRESSION:Soft tissue swelling and stranding. No subcutaneous gas or acute osseous abnormality identified about the left tib-fib. Of note there was a question of Coumadin induced skin necrosis and has been on Coumadin since the late 90's. Last hemoglobin A1c was 6.7. 10/26/2015 -- arterial and venous duplex studies were done but report is still pending. 11/02/2015 -- a lower extremity venous duplex reflux evaluation -- there was no evidence of DVT or SVT in Tsang, Adonnis R. ([id number]) the left lower extremity. Venous incompetence is  noted in the left femoral vein. No further Vascular cosultation was recommended for this. Arterial studies done showed he had noncompressible vessels bilaterally and hence ABI was not reliable. The right toe brachial index was 0.56 which was abnormal and the left was 0.81 which was normal. Doppler signals were triphasic bilaterally both posterior tibial and dorsalis pedis. Objective Constitutional Pulse regular. Respirations normal and unlabored. Afebrile. Vitals Time Taken: 1:49 PM, Height: 70 in, Weight: 165.7 lbs, BMI: 23.8, Temperature: 98.2 F, Pulse: 68 bpm, Respiratory Rate: 16 breaths/min, Blood Pressure: 154/60 mmHg. Eyes Nonicteric. Reactive to light. Ears, Nose, Mouth, and Throat Lips, teeth, and gums WNL.[name] Moist mucosa without lesions. Neck supple and nontender. No palpable supraclavicular or cervical adenopathy. Normal sized without goiter. Respiratory WNL. No retractions.. Cardiovascular Pedal Pulses WNL. No clubbing, cyanosis or edema. Lymphatic No adneopathy. No adenopathy. No adenopathy. Musculoskeletal Adexa without tenderness or enlargement.. Digits and nails w/o clubbing, cyanosis, infection, petechiae, ischemia, or inflammatory conditions.[name] Psychiatric Judgement and insight Intact.. No evidence of depression, anxiety, or agitation.. General Notes: the undermining is less today and I was able to be not this wound with a moist saline gauze and no sharp debridement was required Dyches, Dovber R. ([id number]) Integumentary (Hair, Skin) No suspicious lesions. No crepitus or fluctuance. No peri-wound warmth or erythema. No masses.. Wound #1 status is Open. Original cause of wound was Gradually Appeared. The wound is located on the Left,Anterior Lower Leg. The wound measures 2cm length x 1.5cm width x 0.3cm depth; 2.356cm^2 area and 0.707cm^3 volume. The wound is limited to skin breakdown. There is no tunneling noted, however, there is undermining starting at 11:00  and ending at 8:00 with a maximum distance of 0.9cm. There is a large amount of serosanguineous drainage noted. The wound margin is distinct with the outline attached to the wound base. There is medium (34-66%) red granulation within the wound bed. There is a medium (34- 66%) amount of necrotic tissue within the wound bed including Adherent Slough. The periwound skin appearance exhibited: Localized Edema, Moist. Periwound temperature was noted as No Abnormality. The periwound has tenderness on palpation. Assessment Active Problems ICD-10 E11.622 - Type 2 diabetes mellitus with other skin ulcer L97.222 - Non-pressure chronic ulcer of left calf with fat layer exposed I89.0 - Lymphedema, not elsewhere classified Plan Wound Cleansing: Wound #1 Left,Anterior Lower Leg: Clean wound with wound cleanser. Cleanse wound with mild soap and water May Shower, gently pat wound dry prior to applying new dressing. Anesthetic: Wound #1 Left,Anterior Lower Leg: Topical Lidocaine 4% cream applied to wound bed prior to debridement Primary Wound Dressing: Wound #1 Left,Anterior Lower  Leg: Santyl Ointment Plain packing gauze - pack into the undermining of wound lightly Secondary Dressing: Wound #1 Left,Anterior Lower Leg: Contact Layer Dry Gauze Conform/Kerlix - stretch netting #4, tape Dressing Change Frequency: [place], Jenna R. ([id number]) Wound #1 Left,Anterior Lower Leg: Change dressing every day. Follow-up Appointments: Wound #1 Left,Anterior Lower Leg: Return Appointment in 1 week. Edema Control: Wound #1 Left,Anterior Lower Leg: Elevate legs to the level of the heart and pump ankles as often as possible Additional Orders / Instructions: Wound #1 Left,Anterior Lower Leg: Increase protein intake. Home Health: Wound #1 Left,Anterior Lower Leg: [place] Visits - [place] Nurse may visit PRN to address patient s wound care needs. FACE TO FACE ENCOUNTER: MEDICARE  and MEDICAID PATIENTS: I certify that this patient is under my care and that I had a face-to-face encounter that meets the physician face-to-face encounter requirements with this patient on this date. The encounter with the patient was in whole or in part for the following MEDICAL CONDITION: (primary reason for [place]) MEDICAL NECESSITY: I certify, that based on my findings, NURSING services are a medically necessary home health service. HOME BOUND STATUS: I certify that my clinical findings support that this patient is homebound (i.e., Due to illness or injury, pt requires aid of supportive devices such as crutches, cane, wheelchairs, walkers, the use of special transportation or the assistance of another person to leave their place of residence. There is a normal inability to leave the home and doing so requires considerable and taxing effort. Other absences are for medical reasons / religious services and are infrequent or of short duration when for other reasons). If current dressing causes regression in wound condition, may D/C ordered dressing product/s and apply Normal Saline Moist Dressing daily until next [place] / Other MD appointment. [place] of regression in wound condition at [phone]. Please direct any NON-WOUND related issues/requests for orders to patient's Primary Care Physician Medications-please add to medication list.: Wound #1 Left,Anterior Lower Leg: Santyl Enzymatic Ointment I have recommended: 1. Santyl ointment locally to be placed with some packing gauze. 2. light compression stockings 20-30 mm of Hg to be worn all day 3. Venous duplex studies to look for reflux. study done and reports discussed with him in detail 4. Arterial duplex study -- study done and reports reviewed with the patient and his wife 5. Both arterial and venous duplex studies are within normal limits and at this stage will not warrant a vascular  consult 6. regular visits to the wound center He and his wife have had all QUESTIONS answered and will be compliant Trigg, Aubery R. ([id number]) Electronic Signature(s) Signed: 11/02/2015 3:02:56 PM By: Christin Fudge MD, FACS Entered By: Christin Fudge on 11/02/2015 15:02:56 [place], Colum Alfonso Patten ([id number]) -------------------------------------------------------------------------------- SuperBill Details Patient Name: [name] Date of Service: 11/02/2015 Medical Record Number: [id number] Patient Account Number: [id number] Date of Birth/Sex: 1941/01/07 (75 y.o. Male) Treating RN: Ahmed Prima Primary Care Physician: Jani Gravel Other Clinician: Referring Physician: Jani Gravel Treating Physician/Extender: Frann Rider in Treatment: 2 Diagnosis Coding ICD-10 Codes Code Description [phone] Type 2 diabetes mellitus with other skin ulcer L97.222 Non-pressure chronic ulcer of left calf with fat layer exposed I89.0 Lymphedema, not elsewhere classified Facility Procedures CPT4 Code: [id number] Description: 99213 - WOUND CARE VISIT-LEV 3 EST PT Modifier: Quantity: 1 Physician Procedures CPT4 Code: [id number] Description: 56389 - WC PHYS LEVEL 3 - EST PT ICD-10 Description Diagnosis E11.622 Type 2 diabetes mellitus with other  skin ulcer L97.222 Non-pressure chronic ulcer of left calf with fat I89.0 Lymphedema, not elsewhere classified Modifier: layer exposed Quantity: 1 Electronic Signature(s) Signed: 11/02/2015 3:03:11 PM By: Christin Fudge MD, FACS Entered By: Christin Fudge on 11/02/2015 15:03:11

## 2015-11-03 DIAGNOSIS — I10 Essential (primary) hypertension: Secondary | ICD-10-CM | POA: Diagnosis not present

## 2015-11-03 DIAGNOSIS — Z5181 Encounter for therapeutic drug level monitoring: Secondary | ICD-10-CM | POA: Diagnosis not present

## 2015-11-03 DIAGNOSIS — L03116 Cellulitis of left lower limb: Secondary | ICD-10-CM | POA: Diagnosis not present

## 2015-11-03 DIAGNOSIS — K7460 Unspecified cirrhosis of liver: Secondary | ICD-10-CM | POA: Diagnosis not present

## 2015-11-03 DIAGNOSIS — D693 Immune thrombocytopenic purpura: Secondary | ICD-10-CM | POA: Diagnosis not present

## 2015-11-03 DIAGNOSIS — Z79891 Long term (current) use of opiate analgesic: Secondary | ICD-10-CM | POA: Diagnosis not present

## 2015-11-03 DIAGNOSIS — R238 Other skin changes: Secondary | ICD-10-CM | POA: Diagnosis not present

## 2015-11-03 DIAGNOSIS — Z7984 Long term (current) use of oral hypoglycemic drugs: Secondary | ICD-10-CM | POA: Diagnosis not present

## 2015-11-03 DIAGNOSIS — Z7901 Long term (current) use of anticoagulants: Secondary | ICD-10-CM | POA: Diagnosis not present

## 2015-11-03 DIAGNOSIS — Z792 Long term (current) use of antibiotics: Secondary | ICD-10-CM | POA: Diagnosis not present

## 2015-11-03 DIAGNOSIS — E119 Type 2 diabetes mellitus without complications: Secondary | ICD-10-CM | POA: Diagnosis not present

## 2015-11-03 DIAGNOSIS — D649 Anemia, unspecified: Secondary | ICD-10-CM | POA: Diagnosis not present

## 2015-11-04 NOTE — Progress Notes (Signed)
DIOR, STEPTER ([id number]) Visit Report for 11/02/2015 Arrival Information Details Patient Name: [name], [name] Date of Service: 11/02/2015 1:45 PM Medical Record Number: [id number] Patient Account Number: [id number] Date of Birth/Sex: November 20, 1940 (75 y.o. Male) Treating RN: Ahmed Prima Primary Care Physician: Jani Gravel Other Clinician: Referring Physician: Jani Gravel Treating Physician/Extender: Frann Rider in Treatment: 2 Visit Information History Since Last Visit All ordered tests and consults were completed: No Patient Arrived: Ambulatory Added or deleted any medications: No Arrival Time: 13:48 Any new allergies or adverse reactions: No Accompanied By: wife Had a fall or experienced change in No Transfer Assistance: None activities of daily living that may affect Patient Identification Verified: Yes risk of falls: Secondary Verification Process Yes Signs or symptoms of abuse/neglect since last No Completed: visito Patient Requires No Hospitalized since last visit: No Transmission-Based Pain Present Now: No Precautions: Patient Has Alerts: Yes Patient Alerts: Patient on Blood Thinner Warfarin DM II ABIs noncompressible Electronic Signature(s) Signed: 11/03/2015 4:43:58 PM By: Alric Quan Entered By: Alric Quan on 11/02/2015 13:49:48 Mcdougall, [name] Kitchen ([id number]) -------------------------------------------------------------------------------- Clinic Level of Care Assessment Details Patient Name: [name] Date of Service: 11/02/2015 1:45 PM Medical Record Number: [id number] Patient Account Number: [id number] Date of Birth/Sex: 07/02/1940 (75 y.o. Male) Treating RN: Baruch Gouty, RN, BSN, [place] Primary Care Physician: Jani Gravel Other Clinician: Referring Physician: Jani Gravel Treating Physician/Extender: Frann Rider in Treatment: 2 Clinic Level of Care Assessment Items TOOL 4 Quantity Score []  - Use when only an EandM is  performed on FOLLOW-UP visit 0 ASSESSMENTS - Nursing Assessment / Reassessment X - Reassessment of Co-morbidities (includes updates in patient status) 1 10 X - Reassessment of Adherence to Treatment Plan 1 5 ASSESSMENTS - Wound and Skin Assessment / Reassessment X - Simple Wound Assessment / Reassessment - one wound 1 5 []  - Complex Wound Assessment / Reassessment - multiple wounds 0 []  - Dermatologic / Skin Assessment (not related to wound area) 0 ASSESSMENTS - Focused Assessment []  - Circumferential Edema Measurements - multi extremities 0 []  - Nutritional Assessment / Counseling / Intervention 0 X - Lower Extremity Assessment (monofilament, tuning fork, pulses) 1 5 []  - Peripheral Arterial Disease Assessment (using hand held doppler) 0 ASSESSMENTS - Ostomy and/or Continence Assessment and Care []  - Incontinence Assessment and Management 0 []  - Ostomy Care Assessment and Management (repouching, etc.) 0 PROCESS - Coordination of Care X - Simple Patient / Family Education for ongoing care 1 15 []  - Complex (extensive) Patient / Family Education for ongoing care 0 X - Staff obtains Programmer, systems, Records, Test Results / Process Orders 1 10 X - Staff telephones HHA, Nursing Homes / Clarify orders / etc 1 10 []  - Routine Transfer to another Facility (non-emergent condition) 0 Filley, [name]. ([id number]) []  - Routine Hospital Admission (non-emergent condition) 0 []  - New Admissions / Biomedical engineer / Ordering NPWT, Apligraf, etc. 0 []  - Emergency Hospital Admission (emergent condition) 0 []  - Simple Discharge Coordination 0 []  - Complex (extensive) Discharge Coordination 0 PROCESS - Special Needs []  - Pediatric / Minor Patient Management 0 []  - Isolation Patient Management 0 []  - Hearing / Language / Visual special needs 0 []  - Assessment of Community assistance (transportation, D/C planning, etc.) 0 []  - Additional assistance / Altered mentation 0 []  - Support Surface(s)  Assessment (bed, cushion, seat, etc.) 0 INTERVENTIONS - Wound Cleansing / Measurement X - Simple Wound Cleansing - one wound 1 5 []  - Complex Wound Cleansing - multiple wounds  0 X - Wound Imaging (photographs - any number of wounds) 1 5 []  - Wound Tracing (instead of photographs) 0 X - Simple Wound Measurement - one wound 1 5 []  - Complex Wound Measurement - multiple wounds 0 INTERVENTIONS - Wound Dressings X - Small Wound Dressing one or multiple wounds 1 10 []  - Medium Wound Dressing one or multiple wounds 0 []  - Large Wound Dressing one or multiple wounds 0 []  - Application of Medications - topical 0 []  - Application of Medications - injection 0 INTERVENTIONS - Miscellaneous []  - External ear exam 0 [name], [name]. ([id number]) []  - Specimen Collection (cultures, biopsies, blood, body fluids, etc.) 0 []  - Specimen(s) / Culture(s) sent or taken to Lab for analysis 0 []  - Patient Transfer (multiple staff / [name] / Similar devices) 0 []  - Simple Staple / Suture removal (25 or less) 0 []  - Complex Staple / Suture removal (26 or more) 0 []  - Hypo / Hyperglycemic Management (close monitor of Blood Glucose) 0 []  - Ankle / Brachial Index (ABI) - do not check if billed separately 0 X - Vital Signs 1 5 Has the patient been seen at the hospital within the last three years: Yes Total Score: 90 Level Of Care: New/Established - Level 3 Electronic Signature(s) Signed: 11/02/2015 5:04:09 PM By: Regan Lemming BSN, RN Entered By: Regan Lemming on 11/02/2015 14:12:59 [name], [name] ([id number]) -------------------------------------------------------------------------------- Encounter Discharge Information Details Patient Name: [name] Date of Service: 11/02/2015 1:45 PM Medical Record Number: [id number] Patient Account Number: [id number] Date of Birth/Sex: 03/16/1940 (75 y.o. Male) Treating RN: Ahmed Prima Primary Care Physician: Jani Gravel Other Clinician: Referring  Physician: Jani Gravel Treating Physician/Extender: Frann Rider in Treatment: 2 Encounter Discharge Information Items Discharge Pain Level: 0 Discharge Condition: Stable Ambulatory Status: Ambulatory Discharge Destination: Home Transportation: Private Auto Accompanied By: wife Schedule Follow-up Appointment: Yes Medication Reconciliation completed and provided to Patient/Care Yes Annalyssa Thune: Provided on Clinical Summary of Care: 11/02/2015 Form Type Recipient Paper Patient BP Electronic Signature(s) Signed: 11/02/2015 2:24:59 PM By: Ruthine Dose Entered By: Ruthine Dose on 11/02/2015 14:24:59 [name], [name] Kitchen ([id number]) -------------------------------------------------------------------------------- Lower Extremity Assessment Details Patient Name: [name] Date of Service: 11/02/2015 1:45 PM Medical Record Number: [id number] Patient Account Number: [id number] Date of Birth/Sex: May 17, 1940 (75 y.o. Male) Treating RN: Ahmed Prima Primary Care Physician: Jani Gravel Other Clinician: Referring Physician: Jani Gravel Treating Physician/Extender: Frann Rider in Treatment: 2 Vascular Assessment Pulses: Posterior Tibial Dorsalis Pedis Palpable: [Left:Yes] Extremity colors, hair growth, and conditions: Extremity Color: [Left:Hyperpigmented] Temperature of Extremity: [Left:Warm] Capillary Refill: [Left:< 3 seconds] Toe Nail Assessment Left: Right: Thick: No Discolored: No Deformed: No Improper Length and Hygiene: No Electronic Signature(s) Signed: 11/03/2015 4:43:58 PM By: Alric Quan Entered By: Alric Quan on 11/02/2015 13:54:13 [name], [name]. ([id number]) -------------------------------------------------------------------------------- Multi Wound Chart Details Patient Name: [name] Date of Service: 11/02/2015 1:45 PM Medical Record Number: [id number] Patient Account Number: [id number] Date of Birth/Sex: [birth year] (75 y.o.  Male) Treating RN: Baruch Gouty, RN, BSN, [place] Joshua Primary Care Physician: Jani Gravel Other Clinician: Referring Physician: Jani Gravel Treating Physician/Extender: Frann Rider in Treatment: 2 Vital Signs Height(in): 70 Pulse(bpm): 68 Weight(lbs): 165.7 Blood Pressure 154/60 (mmHg): Body Mass Index(BMI): 24 Temperature(F): 98.2 Respiratory Rate 16 (breaths/min): Photos: [1:No Photos] [N/A:N/A] Wound Location: [1:Left Lower Leg - Anterior] [N/A:N/A] Wounding Event: [1:Gradually Appeared] [N/A:N/A] Primary Etiology: [1:Diabetic Wound/Ulcer of the Lower Extremity] [N/A:N/A] Comorbid History: [1:Cataracts, Anemia, Hypertension, Type II Diabetes, Osteoarthritis] [N/A:N/A] Date Acquired: [  1:09/21/2015] [N/A:N/A] Weeks of Treatment: [1:2] [N/A:N/A] Wound Status: [1:Open] [N/A:N/A] Measurements L x W x D 2x1.5x0.3 [N/A:N/A] (cm) Area (cm) : [1:2.356] [N/A:N/A] Volume (cm) : [1:0.707] [N/A:N/A] % Reduction in Area: [1:4.80%] [N/A:N/A] % Reduction in Volume: -186.20% [N/A:N/A] Starting Position 1 11 (o'clock): Ending Position 1 [1:8] (o'clock): Maximum Distance 1 0.9 (cm): Undermining: [1:Yes] [N/A:N/A] Classification: [1:Grade 1] [N/A:N/A] Exudate Amount: [1:Large] [N/A:N/A] Exudate Type: [1:Serosanguineous] [N/A:N/A] Exudate Color: [1:red, brown] [N/A:N/A] Wound Margin: [1:Distinct, outline attached] [N/A:N/A] Granulation Amount: [1:Medium (34-66%)] [N/A:N/A] Granulation Quality: Red N/A N/A Necrotic Amount: Medium (34-66%) N/A N/A Exposed Structures: Fascia: No N/A N/A Fat: No Tendon: No Muscle: No Joint: No Bone: No Limited to Skin Breakdown Epithelialization: None N/A N/A Periwound Skin Texture: Edema: Yes N/A N/A Periwound Skin Moist: Yes N/A N/A Moisture: Periwound Skin Color: No Abnormalities Noted N/A N/A Temperature: No Abnormality N/A N/A Tenderness on Yes N/A N/A Palpation: Wound Preparation: Ulcer Cleansing: N/A N/A Rinsed/Irrigated  with Saline Topical Anesthetic Applied: Other: lidocaine 4% Treatment Notes Electronic Signature(s) Signed: 11/02/2015 5:04:09 PM By: Regan Lemming BSN, RN Entered By: Regan Lemming on 11/02/2015 14:10:51 [name], [name] ([id number]) -------------------------------------------------------------------------------- [place] Details Patient Name: [name] Date of Service: 11/02/2015 1:45 PM Medical Record Number: [id number] Patient Account Number: [id number] Date of Birth/Sex: 10-Feb-1941 (75 y.o. Male) Treating RN: Baruch Gouty, RN, BSN, [place] Primary Care Physician: Jani Gravel Other Clinician: Referring Physician: Jani Gravel Treating Physician/Extender: Frann Rider in Treatment: 2 Active Inactive Abuse / Safety / Falls / Self Care Management Nursing Diagnoses: Potential for falls Goals: Patient will remain injury free Date Initiated: 10/16/2015 Goal Status: Active Interventions: Assess fall risk on admission and as needed Notes: Nutrition Nursing Diagnoses: Imbalanced nutrition Impaired glucose control: actual or potential Goals: Patient/caregiver agrees to and verbalizes understanding of need to use nutritional supplements and/or vitamins as prescribed Date Initiated: 10/16/2015 Goal Status: Active Patient/caregiver verbalizes understanding of need to maintain therapeutic glucose control per primary care physician Date Initiated: 10/16/2015 Goal Status: Active Interventions: Assess patient nutrition upon admission and as needed per policy Provide education on elevated blood sugars and impact on wound healing Notes: [name], [name]. ([id number]) Orientation to the Wound Care Program Nursing Diagnoses: Knowledge deficit related to the wound healing center program Goals: Patient/caregiver will verbalize understanding of the [place] Date Initiated: 10/16/2015 Goal Status: Active Interventions: Provide education on  orientation to the wound center Notes: Pain, Acute or Chronic Nursing Diagnoses: Pain, acute or chronic: actual or potential Potential alteration in comfort, pain Goals: Patient will verbalize adequate pain control and receive pain control interventions during procedures as needed Date Initiated: 10/16/2015 Goal Status: Active Interventions: Assess comfort goal upon admission Complete pain assessment as per visit requirements Notes: Wound/Skin Impairment Nursing Diagnoses: Impaired tissue integrity Goals: Ulcer/skin breakdown will have a volume reduction of 30% by week 4 Date Initiated: 10/16/2015 Goal Status: Active Ulcer/skin breakdown will have a volume reduction of 50% by week 8 Date Initiated: 10/16/2015 Goal Status: Active Ulcer/skin breakdown will have a volume reduction of 80% by week 12 Date Initiated: 10/16/2015 [name], [name] ([id number]) Goal Status: Active Interventions: Assess patient/caregiver ability to perform ulcer/skin care regimen upon admission and as needed Assess ulceration(s) every visit Notes: Electronic Signature(s) Signed: 11/02/2015 5:04:09 PM By: Regan Lemming BSN, RN Entered By: Regan Lemming on 11/02/2015 14:10:08 [name], [name] Kitchen ([id number]) -------------------------------------------------------------------------------- Pain Assessment Details Patient Name: [name] Date of Service: 11/02/2015 1:45 PM Medical Record Number: [id number] Patient Account Number: [id number]  Date of Birth/Sex: 03/26/1940 (75 y.o. Male) Treating RN: Carolyne Fiscal, Debi Primary Care Physician: Jani Gravel Other Clinician: Referring Physician: Jani Gravel Treating Physician/Extender: Frann Rider in Treatment: 2 Active Problems Location of Pain Severity and Description of Pain Patient Has Paino No Site Locations With Dressing Change: No Pain Management and Medication Current Pain Management: Electronic Signature(s) Signed: 11/03/2015 4:43:58 PM By:  Alric Quan Entered By: Alric Quan on 11/02/2015 13:49:54 [name], [name] ([id number]) -------------------------------------------------------------------------------- Patient/Caregiver Education Details Patient Name: [name] Date of Service: 11/02/2015 1:45 PM Medical Record Number: [id number] Patient Account Number: [id number] Date of Birth/Gender: Oct 04, 1940 (75 y.o. Male) Treating RN: Ahmed Prima Primary Care Physician: Jani Gravel Other Clinician: Referring Physician: Jani Gravel Treating Physician/Extender: Frann Rider in Treatment: 2 Education Assessment Education Provided To: Patient Education Topics Provided Wound/Skin Impairment: Handouts: Other: change dressing as ordered Methods: Demonstration, Explain/Verbal Responses: State content correctly Electronic Signature(s) Signed: 11/03/2015 4:43:58 PM By: Alric Quan Entered By: Alric Quan on 11/02/2015 14:05:24 [name], [name] Kitchen ([id number]) -------------------------------------------------------------------------------- Wound Assessment Details Patient Name: [name] Date of Service: 11/02/2015 1:45 PM Medical Record Number: [id number] Patient Account Number: [id number] Date of Birth/Sex: 1941-02-03 (75 y.o. Male) Treating RN: Ahmed Prima Primary Care Physician: Jani Gravel Other Clinician: Referring Physician: Jani Gravel Treating Physician/Extender: Frann Rider in Treatment: 2 Wound Status Wound Number: 1 Primary Diabetic Wound/Ulcer of the Lower Etiology: Extremity Wound Location: Left Lower Leg - Anterior Wound Open Wounding Event: Gradually Appeared Status: Date Acquired: 09/21/2015 Comorbid Cataracts, Anemia, Hypertension, Weeks Of Treatment: 2 History: Type II Diabetes, Osteoarthritis Clustered Wound: No Photos Photo Uploaded By: Alric Quan on 11/02/2015 15:51:08 Wound Measurements Length: (cm) 2 Width: (cm) 1.5 Depth: (cm) 0.3 Area:  (cm) 2.356 Volume: (cm) 0.707 % Reduction in Area: 4.8% % Reduction in Volume: -186.2% Epithelialization: None Tunneling: No Undermining: Yes Starting Position (o'clock): 11 Ending Position (o'clock): 8 Maximum Distance: (cm) 0.9 Wound Description Classification: Grade 1 Wound Margin: Distinct, outline attached Exudate Amount: Large Exudate Type: Serosanguineous Exudate Color: red, brown Foul Odor After Cleansing: No Wound Bed [name], [name]. ([id number]) Granulation Amount: Medium (34-66%) Exposed Structure Granulation Quality: Red Fascia Exposed: No Necrotic Amount: Medium (34-66%) Fat Layer Exposed: No Necrotic Quality: Adherent Slough Tendon Exposed: No Muscle Exposed: No Joint Exposed: No Bone Exposed: No Limited to Skin Breakdown Periwound Skin Texture Texture Color No Abnormalities Noted: No No Abnormalities Noted: No Localized Edema: Yes Temperature / Pain Moisture Temperature: No Abnormality No Abnormalities Noted: No Tenderness on Palpation: Yes Moist: Yes Wound Preparation Ulcer Cleansing: Rinsed/Irrigated with Saline Topical Anesthetic Applied: Other: lidocaine 4%, Treatment Notes Wound #1 (Left, Anterior Lower Leg) 1. Cleansed with: Clean wound with Normal Saline 2. Anesthetic Topical Lidocaine 4% cream to wound bed prior to debridement 4. Dressing Applied: Santyl Ointment Plain packing gauze 5. Secondary Dressing Applied Dry Gauze Kerlix/Conform 7. Secured with Tape Notes netting Electronic Signature(s) Signed: 11/03/2015 4:43:58 PM By: Alric Quan Entered By: Alric Quan on 11/02/2015 13:58:05 Sitzmann, Josey [name] Kitchen ([id number]) -------------------------------------------------------------------------------- Vitals Details Patient Name: [name] Date of Service: 11/02/2015 1:45 PM Medical Record Number: [id number] Patient Account Number: [id number] Date of Birth/Sex: 09-16-1940 (75 y.o. Male) Treating RN: Ahmed Prima Primary Care Physician: Jani Gravel Other Clinician: Referring Physician: Jani Gravel Treating Physician/Extender: Frann Rider in Treatment: 2 Vital Signs Time Taken: 13:49 Temperature (F): 98.2 Height (in): 70 Pulse (bpm): 68 Weight (lbs): 165.7 Respiratory Rate (breaths/min): 16 Body Mass Index (BMI): 23.8 Blood Pressure (mmHg): 154/60 Reference Range:  80 - 120 mg / dl Electronic Signature(s) Signed: 11/03/2015 4:43:58 PM By: Alric Quan Entered By: Alric Quan on 11/02/2015 13:52:28

## 2015-11-08 DIAGNOSIS — D649 Anemia, unspecified: Secondary | ICD-10-CM | POA: Diagnosis not present

## 2015-11-08 DIAGNOSIS — I10 Essential (primary) hypertension: Secondary | ICD-10-CM | POA: Diagnosis not present

## 2015-11-08 DIAGNOSIS — R238 Other skin changes: Secondary | ICD-10-CM | POA: Diagnosis not present

## 2015-11-08 DIAGNOSIS — Z7984 Long term (current) use of oral hypoglycemic drugs: Secondary | ICD-10-CM | POA: Diagnosis not present

## 2015-11-08 DIAGNOSIS — Z792 Long term (current) use of antibiotics: Secondary | ICD-10-CM | POA: Diagnosis not present

## 2015-11-08 DIAGNOSIS — L03116 Cellulitis of left lower limb: Secondary | ICD-10-CM | POA: Diagnosis not present

## 2015-11-08 DIAGNOSIS — Z7901 Long term (current) use of anticoagulants: Secondary | ICD-10-CM | POA: Diagnosis not present

## 2015-11-08 DIAGNOSIS — Z79891 Long term (current) use of opiate analgesic: Secondary | ICD-10-CM | POA: Diagnosis not present

## 2015-11-08 DIAGNOSIS — D693 Immune thrombocytopenic purpura: Secondary | ICD-10-CM | POA: Diagnosis not present

## 2015-11-08 DIAGNOSIS — K7460 Unspecified cirrhosis of liver: Secondary | ICD-10-CM | POA: Diagnosis not present

## 2015-11-08 DIAGNOSIS — E119 Type 2 diabetes mellitus without complications: Secondary | ICD-10-CM | POA: Diagnosis not present

## 2015-11-08 DIAGNOSIS — Z5181 Encounter for therapeutic drug level monitoring: Secondary | ICD-10-CM | POA: Diagnosis not present

## 2015-11-10 ENCOUNTER — Encounter: Payer: Medicare Other | Admitting: Surgery

## 2015-11-10 DIAGNOSIS — I6523 Occlusion and stenosis of bilateral carotid arteries: Secondary | ICD-10-CM | POA: Diagnosis not present

## 2015-11-10 DIAGNOSIS — I890 Lymphedema, not elsewhere classified: Secondary | ICD-10-CM | POA: Diagnosis not present

## 2015-11-10 DIAGNOSIS — M1990 Unspecified osteoarthritis, unspecified site: Secondary | ICD-10-CM | POA: Diagnosis not present

## 2015-11-10 DIAGNOSIS — I10 Essential (primary) hypertension: Secondary | ICD-10-CM | POA: Diagnosis not present

## 2015-11-10 DIAGNOSIS — L97222 Non-pressure chronic ulcer of left calf with fat layer exposed: Secondary | ICD-10-CM | POA: Diagnosis not present

## 2015-11-10 DIAGNOSIS — Z7901 Long term (current) use of anticoagulants: Secondary | ICD-10-CM | POA: Diagnosis not present

## 2015-11-10 DIAGNOSIS — D649 Anemia, unspecified: Secondary | ICD-10-CM | POA: Diagnosis not present

## 2015-11-10 DIAGNOSIS — E11622 Type 2 diabetes mellitus with other skin ulcer: Secondary | ICD-10-CM | POA: Diagnosis not present

## 2015-11-10 DIAGNOSIS — Z79899 Other long term (current) drug therapy: Secondary | ICD-10-CM | POA: Diagnosis not present

## 2015-11-10 DIAGNOSIS — Z8673 Personal history of transient ischemic attack (TIA), and cerebral infarction without residual deficits: Secondary | ICD-10-CM | POA: Diagnosis not present

## 2015-11-10 DIAGNOSIS — Z7984 Long term (current) use of oral hypoglycemic drugs: Secondary | ICD-10-CM | POA: Diagnosis not present

## 2015-11-10 DIAGNOSIS — E785 Hyperlipidemia, unspecified: Secondary | ICD-10-CM | POA: Diagnosis not present

## 2015-11-10 DIAGNOSIS — L97821 Non-pressure chronic ulcer of other part of left lower leg limited to breakdown of skin: Secondary | ICD-10-CM | POA: Diagnosis not present

## 2015-11-10 DIAGNOSIS — Z86718 Personal history of other venous thrombosis and embolism: Secondary | ICD-10-CM | POA: Diagnosis not present

## 2015-11-11 NOTE — Progress Notes (Signed)
[name] ([id number]) Visit Report for 11/10/2015 Chief Complaint Document Details Patient Name: [name], [name] Date of Service: 11/10/2015 12:45 PM Medical Record Number: [id number] Patient Account Number: [id number] Date of Birth/Sex: [birth year] (75 y.o. Male) Treating RN: Ahmed Prima Primary Care Physician: Jani Gravel Other Clinician: Referring Physician: Jani Gravel Treating Physician/Extender: Frann Rider in Treatment: 3 Information Obtained from: Patient Chief Complaint Patients presents for treatment of an open diabetic ulcer left lower extremity with swelling, cellulitis and ulceration for about a month Electronic Signature(s) Signed: 11/10/2015 1:16:51 PM By: Christin Fudge MD, FACS Entered By: Christin Fudge on 11/10/2015 13:16:51 [place], [name] ([id number]) -------------------------------------------------------------------------------- Debridement Details Patient Name: [name] Date of Service: 11/10/2015 12:45 PM Medical Record Number: [id number] Patient Account Number: [id number] Date of Birth/Sex: 1940-05-08 (75 y.o. Male) Treating RN: Ahmed Prima Primary Care Physician: Jani Gravel Other Clinician: Referring Physician: Jani Gravel Treating Physician/Extender: Frann Rider in Treatment: 3 Debridement Performed for Wound #1 Left,Anterior Lower Leg Assessment: Performed By: Physician Christin Fudge, MD Debridement: Debridement Pre-procedure Yes - 13:10 Verification/Time Out Taken: Start Time: 13:10 Pain Control: Lidocaine 4% Topical Solution Level: Skin/Subcutaneous Tissue Total Area Debrided (L x 1.9 (cm) x 1.3 (cm) = 2.47 (cm) W): Tissue and other Viable, Non-Viable, Exudate, Fibrin/Slough, Subcutaneous material debrided: Instrument: Curette Bleeding: Minimum Hemostasis Achieved: Pressure End Time: 13:12 Procedural Pain: 0 Post Procedural Pain: 0 Response to Treatment: Procedure was tolerated well Post Debridement  Measurements of Total Wound Length: (cm) 1.9 Width: (cm) 1.3 Depth: (cm) 0.3 Volume: (cm) 0.582 Character of Wound/Ulcer Post Stable Debridement: Severity of Tissue Post Debridement: Fat layer exposed Post Procedure Diagnosis Same as Pre-procedure Electronic Signature(s) Signed: 11/10/2015 1:16:37 PM By: Christin Fudge MD, FACS Signed: 11/10/2015 5:24:41 PM By: Alric Quan Entered By: Christin Fudge on 11/10/2015 13:16:37 [place], Breckan R. ([id number]) [place], [place]land Kitchen ([id number]) -------------------------------------------------------------------------------- HPI Details Patient Name: [name] Date of Service: 11/10/2015 12:45 PM Medical Record Number: [id number] Patient Account Number: [id number] Date of Birth/Sex: [birth year] (75 y.o. Male) Treating RN: Ahmed Prima Primary Care Physician: Jani Gravel Other Clinician: Referring Physician: Jani Gravel Treating Physician/Extender: Frann Rider in Treatment: 3 History of Present Illness Location: left lower extremity Quality: Patient reports experiencing a dull pain to affected area(s). Severity: Patient states wound are getting worse. Duration: Patient has had the wound for < 4 weeks prior to presenting for treatment Timing: Pain in wound is Intermittent (comes and goes Context: The wound appeared gradually over time Modifying Factors: Other treatment(s) tried include:hospital admission with IV antibiotics and workup Associated Signs and Symptoms: Patient reports having increase swelling. HPI Description: 75 year old gentleman who was been having his primary care taken care of by Dr. Maudie [name] at [facility] has had recurrent cellulitis the left lower extremity and so far has had 3 attacks. First started in August of this year and was admitted to the ER and sent home with Keflex and. He saw his primary care doctor on August 9 and was referred to [facility] where they began  intravenous antibiotics and was admitted to August 15. He at present on doxycycline 100 mg twice a day. during his inpatient stay he was prepped with a DVT study which was negative and was treated with vancomycin and Zosyn and discharged home onoral doxycycline for 10 days medical history significant for diabetes mellitus type 2 is controlled by diet, hypertension, hyperlipidemia, bilateral carotid stenosis, mild mitral regurgitation, thrombocytopenia, superior mesenteric wait and portal vein thrombosis, colitis of the left  knee, actinic keratosis, right popliteal DVT, right colon AVM,. Never been a smoker. During the admission he had a ultrasound done to look for a abscess and none was found except for subcutaneous edema and there was no focal muscle fluid collection. Xray of the left lower extremity --IMPRESSION:Soft tissue swelling and stranding. No subcutaneous gas or acute osseous abnormality identified about the left tib-fib. oOf note there was a question of Coumadin induced skin necrosis and has been on Coumadin since the late 90's. Last hemoglobin A1c was 6.7. 10/26/2015 -- arterial and venous duplex studies were done but report is still pending. 11/02/2015 -- a lower extremity venous duplex reflux evaluation -- there was no evidence of DVT or SVT in the left lower extremity. 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 No further Vascular cosultation was recommended for this. Arterial studies done showed he had noncompressible vessels bilaterally and hence ABI was not reliable. The right toe brachial index was 0.56 which was abnormal and the left was 0.81 which was normal. Doppler signals were triphasic bilaterally both posterior tibial and dorsalis pedis. Electronic Signature(s) JULYAN, GALES ([id number]) Signed: 11/10/2015 1:17:03 PM By: Christin Fudge MD, FACS Entered By: Christin Fudge on 11/10/2015 13:17:03 [place], [name]  ([id number]) -------------------------------------------------------------------------------- Physical Exam Details Patient Name: [name] Date of Service: 11/10/2015 12:45 PM Medical Record Number: [id number] Patient Account Number: [id number] Date of Birth/Sex: 1940/08/05 (75 y.o. Male) Treating RN: Ahmed Prima Primary Care Physician: Jani Gravel Other Clinician: Referring Physician: Jani Gravel Treating Physician/Extender: Frann Rider in Treatment: 3 Constitutional . Pulse regular. Respirations normal and unlabored. Afebrile. . Eyes Nonicteric. Reactive to light. Ears, Nose, Mouth, and Throat Lips, teeth, and gums WNL.[name] Moist mucosa without lesions. Neck supple and nontender. No palpable supraclavicular or cervical adenopathy. Normal sized without goiter. Respiratory WNL. No retractions.. Breath sounds WNL, No rubs, rales, rhonchi, or wheeze.. Cardiovascular Heart rhythm and rate regular, no murmur or gallop.. Pedal Pulses WNL. No clubbing, cyanosis or edema. Lymphatic No adneopathy. No adenopathy. No adenopathy. Musculoskeletal Adexa without tenderness or enlargement.. Digits and nails w/o clubbing, cyanosis, infection, petechiae, ischemia, or inflammatory conditions.. Integumentary (Hair, Skin) No suspicious lesions. No crepitus or fluctuance. No peri-wound warmth or erythema. No masses.[name] Psychiatric Judgement and insight Intact.. No evidence of depression, anxiety, or agitation.. Notes undermining still persists and the wound has some slough which I sharply removed with a #3 curet and after doing this there is healthy granulation tissue at the base. Electronic Signature(s) Signed: 11/10/2015 1:17:30 PM By: Christin Fudge MD, FACS Entered By: Christin Fudge on 11/10/2015 13:17:30 [place], [name] ([id number]) -------------------------------------------------------------------------------- Physician Orders Details Patient Name: [name] Date of  Service: 11/10/2015 12:45 PM Medical Record Number: [id number] Patient Account Number: [id number] Date of Birth/Sex: [birth year] (75 y.o. Male) Treating RN: Ahmed Prima Primary Care Physician: Jani Gravel Other Clinician: Referring Physician: Jani Gravel Treating Physician/Extender: Frann Rider in Treatment: 3 Verbal / Phone Orders: Yes Clinician: Pinkerton, Debi Read Back and Verified: Yes Diagnosis Coding Wound Cleansing Wound #1 Left,Anterior Lower Leg o Clean wound with wound cleanser. o Cleanse wound with mild soap and water o May Shower, gently pat wound dry prior to applying new dressing. Anesthetic Wound #1 Left,Anterior Lower Leg o Topical Lidocaine 4% cream applied to wound bed prior to debridement Primary Wound Dressing Wound #1 Left,Anterior Lower Leg o Aquacel Ag - Rope pack into the undermining of wound lightly Secondary Dressing Wound #1 Left,Anterior Lower Leg o ABD pad o Dry  Gauze o Conform/Kerlix - stretch netting #4, tape Dressing Change Frequency Wound #1 Left,Anterior Lower Leg o Change dressing every day. Follow-up Appointments Wound #1 Left,Anterior Lower Leg o Return Appointment in 1 week. Edema Control Wound #1 Left,Anterior Lower Leg o Elevate legs to the level of the heart and pump ankles as often as possible Additional Orders / Instructions Wound #1 Left,Anterior Lower Leg Mcnealy, Larin R. ([id number]) o Increase protein intake. Home Health Wound #1 [place] Visits - [facility] ([facility] Nurse may visit PRN to address patientos wound care needs. o FACE TO FACE ENCOUNTER: MEDICARE and MEDICAID PATIENTS: I certify that this patient is under my care and that I had a face-to-face encounter that meets the physician face-to-face encounter requirements with this patient on this date. The encounter with the patient was in whole or in part for the following  MEDICAL CONDITION: (primary reason for [place]) MEDICAL NECESSITY: I certify, that based on my findings, NURSING services are a medically necessary home health service. HOME BOUND STATUS: I certify that my clinical findings support that this patient is homebound (i.e., Due to illness or injury, pt requires aid of supportive devices such as crutches, cane, wheelchairs, walkers, the use of special transportation or the assistance of another person to leave their place of residence. There is a normal inability to leave the home and doing so requires considerable and taxing effort. Other absences are for medical reasons / religious services and are infrequent or of short duration when for other reasons). o If current dressing causes regression in wound condition, may D/C ordered dressing product/s and apply Normal Saline Moist Dressing daily until next [place] / Other MD appointment. [place] of regression in wound condition at [phone]. o Please direct any NON-WOUND related issues/requests for orders to patient's Primary Care Physician Medications-please add to medication list. Wound #1 Left,Anterior Lower Leg o Santyl Enzymatic Ointment o Other: - Vitamin A, Vitamin C, Zinc Electronic Signature(s) Signed: 11/10/2015 3:52:17 PM By: Christin Fudge MD, FACS Signed: 11/10/2015 5:24:41 PM By: Alric Quan Entered By: Alric Quan on 11/10/2015 13:15:54 [place], Tim RMarland Kitchen ([id number]) -------------------------------------------------------------------------------- Problem List Details Patient Name: [name] Date of Service: 11/10/2015 12:45 PM Medical Record Number: [id number] Patient Account Number: [id number] Date of Birth/Sex: 06-17-1940 (76 y.o. 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 Male) Treating RN: Ahmed Prima Primary Care Physician: Jani Gravel Other Clinician: Referring Physician: Jani Gravel Treating Physician/Extender: Frann Rider in Treatment: 3 Subjective Chief Complaint Information obtained from Patient Patients presents for treatment of an open diabetic ulcer left lower extremity with swelling, cellulitis and ulceration for about a month History of Present Illness (HPI) The following HPI elements were documented for the patient's wound: Location: left lower extremity Quality: Patient reports experiencing a  dull pain to affected area(s). Severity: Patient states wound are getting worse. Duration: Patient has had the wound for < 4 weeks prior to presenting for treatment Timing: Pain in wound is Intermittent (comes and goes Context: The wound appeared gradually over time Modifying Factors: Other treatment(s) tried include:hospital admission with IV antibiotics and workup Associated Signs and Symptoms: Patient reports having increase swelling. 75 year old gentleman who was been having his primary care taken care of by Dr. Maudie [name] at [facility] has had recurrent cellulitis the  left lower extremity and so far has had 3 attacks. First started in August of this year and was admitted to the ER and sent home with Keflex and. He saw his primary care doctor on August 9 and was referred to [facility] where they began intravenous antibiotics and was admitted to August 15. He at present on doxycycline 100 mg twice a day. 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 Never been a smoker. During the admission he had a ultrasound done to look for a abscess and none was found except for subcutaneous edema and there was no focal muscle fluid collection. Xray of the left lower extremity --IMPRESSION:Soft tissue swelling and stranding. No subcutaneous gas or acute osseous abnormality identified about the left tib-fib. Of note there was a question of Coumadin induced skin necrosis and has been on Coumadin since the late 90's. Last hemoglobin A1c was 6.7. 10/26/2015 -- arterial and venous duplex studies were done but report is still pending. 11/02/2015 -- a lower extremity venous duplex reflux evaluation -- there was no evidence of DVT or SVT in Freels, Shanard R. ([id number]) the left lower extremity. 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 No further Vascular cosultation was recommended for this. Arterial studies done showed he had noncompressible vessels bilaterally and hence ABI was not reliable. The right toe brachial index was 0.56 which was abnormal and the left was 0.81 which was normal. Doppler signals were triphasic  bilaterally both posterior tibial and dorsalis pedis. Objective Constitutional Pulse regular. Respirations normal and unlabored. Afebrile. Vitals Time Taken: 12:48 PM, Height: 70 in, Weight: 165.7 lbs, BMI: 23.8, Temperature: 98.6 F, Pulse: 70 bpm, Respiratory Rate: 16 breaths/min, Blood Pressure: 136/54 mmHg. General Notes: Made Dr. Con Memos aware of pts diastolic BP. Eyes Nonicteric. Reactive to light. Ears, Nose, Mouth, and Throat Lips, teeth, and gums WNL.[name] Moist mucosa without lesions. Neck supple and nontender. No palpable supraclavicular or cervical adenopathy. Normal sized without goiter. Respiratory WNL. No retractions.. Breath sounds WNL, No rubs, rales, rhonchi, or wheeze.. Cardiovascular Heart rhythm and rate regular, no murmur or gallop.. Pedal Pulses WNL. No clubbing, cyanosis or edema. Lymphatic No adneopathy. No adenopathy. No adenopathy. Musculoskeletal Adexa without tenderness or enlargement.. Digits and nails w/o clubbing, cyanosis, infection, petechiae, ischemia, or inflammatory conditions.[name] Psychiatric Judgement and insight Intact.. No evidence of depression, anxiety, or agitation.. General Notes: undermining still persists and the wound has some slough which I sharply removed with a #3 curet and after doing this there is healthy granulation tissue at the base. Beining, Panagiotis R. ([id number]) Integumentary (Hair, Skin) No suspicious lesions. No crepitus or fluctuance.  No peri-wound warmth or erythema. No masses.. Wound #1 status is Open. Original cause of wound was Gradually Appeared. The wound is located on the Left,Anterior Lower Leg. The wound measures 1.9cm length x 1.3cm width x 0.3cm depth; 1.94cm^2 area and 0.582cm^3 volume. The wound is limited to skin breakdown. There is no tunneling noted, however, there is undermining starting at 9:00 and ending at 11:00 with a maximum distance of 1cm. There is a large amount of serosanguineous drainage noted. The wound  margin is distinct with the outline attached to the wound base. There is medium (34-66%) red granulation within the wound bed. There is a medium (34-66%) amount of necrotic tissue within the wound bed including Adherent Slough. The periwound skin appearance exhibited: Localized Edema, Moist. Periwound temperature was noted as No Abnormality. The periwound has tenderness on palpation. Assessment Active Problems ICD-10 E11.622 - Type 2 diabetes mellitus with other skin ulcer L97.222 - Non-pressure chronic ulcer of left calf with fat layer exposed I89.0 - Lymphedema, not elsewhere classified Procedures Wound #1 Wound #1 is a Diabetic Wound/Ulcer of the Lower Extremity located on the Left,Anterior Lower Leg . There was a Skin/Subcutaneous Tissue Debridement ([id number]) debridement with total area of 2.47 sq cm performed by Christin Fudge, MD. with the following instrument(s): Curette to remove Viable and Non-Viable tissue/material including Exudate, Fibrin/Slough, and Subcutaneous after achieving pain control using Lidocaine 4% Topical Solution. A time out was conducted at 13:10, prior to the start of the procedure. A Minimum amount of bleeding was controlled with Pressure. The procedure was tolerated well with a pain level of 0 throughout and a pain level of 0 following the procedure. Post Debridement Measurements: 1.9cm length x 1.3cm width x 0.3cm depth; 0.582cm^3 volume. Character of Wound/Ulcer Post Debridement is stable. Severity of Tissue Post Debridement is: Fat layer exposed. Post procedure Diagnosis Wound #1: Same as Pre-Procedure Hilgert, Jonathen R. ([id number]) Plan Wound Cleansing: Wound #1 Left,Anterior Lower Leg: Clean wound with wound cleanser. Cleanse wound with mild soap and water May Shower, gently pat wound dry prior to applying new dressing. Anesthetic: Wound #1 Left,Anterior Lower Leg: Topical Lidocaine 4% cream applied to wound bed prior to debridement Primary  Wound Dressing: Wound #1 Left,Anterior Lower Leg: Aquacel Ag - Rope pack into the undermining of wound lightly Secondary Dressing: Wound #1 Left,Anterior Lower Leg: ABD pad Dry Gauze Conform/Kerlix - stretch netting #4, tape Dressing Change Frequency: Wound #1 Left,Anterior Lower Leg: Change dressing every day. Follow-up Appointments: Wound #1 Left,Anterior Lower Leg: Return Appointment in 1 week. Edema Control: Wound #1 Left,Anterior Lower Leg: Elevate legs to the level of the heart and pump ankles as often as possible Additional Orders / Instructions: Wound #1 Left,Anterior Lower Leg: Increase protein intake. Home Health: Wound #1 Left,Anterior Lower Leg: Continue Home Health Visits - [facility] ([facility]) [place] Nurse may visit PRN to address patient s wound care needs. FACE TO FACE ENCOUNTER: MEDICARE and MEDICAID PATIENTS: I certify that this patient is under my care and that I had a face-to-face encounter that meets the physician face-to-face encounter requirements with this patient on this date. The encounter with the patient was in whole or in part for the following MEDICAL CONDITION: (primary reason for [place]) MEDICAL NECESSITY: I certify, that based on my findings, NURSING services are a medically necessary home health service. HOME BOUND STATUS: I certify that my clinical findings support that this patient is homebound (i.e., Due to illness or injury, pt requires aid of supportive devices  such as crutches, cane, wheelchairs, walkers, the use of special transportation or the assistance of another person to leave their place of residence. There is a normal inability to leave the home and doing so requires considerable and taxing effort. Other absences are for medical reasons / religious services and are infrequent or of short duration when for other reasons). If current dressing causes regression in wound condition, may D/C ordered dressing product/s and  apply Normal Saline Moist Dressing daily until next [place] / Other MD appointment. [place] of regression in wound condition at [phone]. Please direct any NON-WOUND related issues/requests for orders to patient's Primary Care Physician Medications-please add to medication list.: Levitan, Fuad R. ([id number]) Wound #1 Left,Anterior Lower Leg: Santyl Enzymatic Ointment Other: - Vitamin A, Vitamin C, Zinc have recommended: 1. to use silver alginate and a bordered foam, to be changed every other day 2. light compression stockings 20-30 mm of Hg to be worn all day 3. protein, vitamin A, vitamin C and zinc 4. regular visits to the wound center He and his wife have had all QUESTIONS answered and will be compliant Electronic Signature(s) Signed: 11/10/2015 1:19:10 PM By: Christin Fudge MD, FACS Entered By: Christin Fudge on 11/10/2015 13:19:10 [place], [name] ([id number]) -------------------------------------------------------------------------------- SuperBill Details Patient Name: [name] Date of Service: 11/10/2015 Medical Record Number: [id number] Patient Account Number: [id number] Date of Birth/Sex: [birth year] (75 y.o. Male) Treating RN: Ahmed Prima Primary Care Physician: Jani Gravel Other Clinician: Referring Physician: Jani Gravel Treating Physician/Extender: Frann Rider in Treatment: 3 Diagnosis Coding ICD-10 Codes Code Description [phone] Type 2 diabetes mellitus with other skin ulcer L97.222 Non-pressure chronic ulcer of left calf with fat layer exposed I89.0 Lymphedema, not elsewhere classified Facility Procedures CPT4 Code: [id number] Description: 09326 - DEB SUBQ TISSUE 20 SQ CM/< ICD-10 Description Diagnosis E11.622 Type 2 diabetes mellitus with other skin ulcer L97.222 Non-pressure chronic ulcer of left calf with fat l I89.0 Lymphedema, not elsewhere classified Modifier: ayer exposed Quantity: 1 Physician  Procedures CPT4 Code: [id number] Description: 99833 - WC PHYS SUBQ TISS 20 SQ CM ICD-10 Description Diagnosis E11.622 Type 2 diabetes mellitus with other skin ulcer L97.222 Non-pressure chronic ulcer of left calf with fat l I89.0 Lymphedema, not elsewhere classified Modifier: ayer exposed Quantity: 1 Electronic Signature(s) Signed: 11/10/2015 1:19:19 PM By: Christin Fudge MD, FACS Entered By: Christin Fudge on 11/10/2015 13:19:19

## 2015-11-11 NOTE — Progress Notes (Signed)
Joshua, [name] ([id number]) Visit Report for 11/10/2015 Arrival Information Details Patient Name: [name], [name] Date of Service: 11/10/2015 12:45 PM Medical Record Number: [id number] Patient Account Number: [id number] Date of Birth/Sex: 11-Jan-1941 (75 y.o. Male) Treating RN: Ahmed Prima Primary Care Physician: Jani Gravel Other Clinician: Referring Physician: Jani Gravel Treating Physician/Extender: Frann Rider in Treatment: 3 Visit Information History Since Last Visit All ordered tests and consults were completed: No Patient Arrived: Ambulatory Added or deleted any medications: No Arrival Time: 12:47 Any new allergies or adverse reactions: No Accompanied By: wife Had a fall or experienced change in No Transfer Assistance: None activities of daily living that may affect Patient Identification Verified: Yes risk of falls: Secondary Verification Process Yes Signs or symptoms of abuse/neglect since last No Completed: visito Patient Requires No Hospitalized since last visit: No Transmission-Based Pain Present Now: No Precautions: Patient Has Alerts: Yes Patient Alerts: Patient on Blood Thinner Warfarin DM II ABIs noncompressible Electronic Signature(s) Signed: 11/10/2015 5:24:41 PM By: Alric Quan Entered By: Alric Quan on 11/10/2015 12:47:57 Perazzo, Ash RMarland Kitchen ([id number]) -------------------------------------------------------------------------------- Encounter Discharge Information Details Patient Name: [name] Date of Service: 11/10/2015 12:45 PM Medical Record Number: [id number] Patient Account Number: [id number] Date of Birth/Sex: April 17, 1940 (75 y.o. Male) Treating RN: Ahmed Prima Primary Care Physician: Jani Gravel Other Clinician: Referring Physician: Jani Gravel Treating Physician/Extender: Frann Rider in Treatment: 3 Encounter Discharge Information Items Discharge Pain Level: 0 Discharge Condition:  Stable Ambulatory Status: Ambulatory Discharge Destination: Home Transportation: Private Auto Accompanied By: wife Schedule Follow-up Appointment: Yes Medication Reconciliation completed and provided to Patient/Care Yes Aydian Dimmick: Provided on Clinical Summary of Care: 11/10/2015 Form Type Recipient Paper Patient BP Electronic Signature(s) Signed: 11/10/2015 1:24:25 PM By: Ruthine Dose Entered By: Ruthine Dose on 11/10/2015 13:24:25 [place], Baltasar RMarland Kitchen ([id number]) -------------------------------------------------------------------------------- Lower Extremity Assessment Details Patient Name: [name] Date of Service: 11/10/2015 12:45 PM Medical Record Number: [id number] Patient Account Number: [id number] Date of Birth/Sex: Sep 15, 1940 (75 y.o. Male) Treating RN: Ahmed Prima Primary Care Physician: Jani Gravel Other Clinician: Referring Physician: Jani Gravel Treating Physician/Extender: Frann Rider in Treatment: 3 Vascular Assessment Pulses: Posterior Tibial Dorsalis Pedis Palpable: [Left:Yes] Extremity colors, hair growth, and conditions: Extremity Color: [Left:Hyperpigmented] Temperature of Extremity: [Left:Warm] Capillary Refill: [Left:< 3 seconds] Toe Nail Assessment Left: Right: Thick: No Discolored: No Deformed: No Improper Length and Hygiene: No Electronic Signature(s) Signed: 11/10/2015 5:24:41 PM By: Alric Quan Entered By: Alric Quan on 11/10/2015 12:50:08 Martinezgarcia, Sricharan RMarland Kitchen ([id number]) -------------------------------------------------------------------------------- Multi Wound Chart Details Patient Name: [name] Date of Service: 11/10/2015 12:45 PM Medical Record Number: [id number] Patient Account Number: [id number] Date of Birth/Sex: 01-01-1941 (75 y.o. Male) Treating RN: Ahmed Prima Primary Care Physician: Jani Gravel Other Clinician: Referring Physician: Jani Gravel Treating Physician/Extender: Frann Rider in Treatment: 3 Vital Signs Height(in): 70 Pulse(bpm): 70 Weight(lbs): 165.7 Blood Pressure 136/54 (mmHg): Body Mass Index(BMI): 24 Temperature(F): 98.6 Respiratory Rate 16 (breaths/min): Photos: [1:No Photos] [N/A:N/A] Wound Location: [1:Left Lower Leg - Anterior] [N/A:N/A] Wounding Event: [1:Gradually Appeared] [N/A:N/A] Primary Etiology: [1:Diabetic Wound/Ulcer of the Lower Extremity] [N/A:N/A] Comorbid History: [1:Cataracts, Anemia, Hypertension, Type II Diabetes, Osteoarthritis] [N/A:N/A] Date Acquired: [1:09/21/2015] [N/A:N/A] Weeks of Treatment: [1:3] [N/A:N/A] Wound Status: [1:Open] [N/A:N/A] Measurements L x W x D 1.9x1.3x0.3 [N/A:N/A] (cm) Area (cm) : [1:1.94] [N/A:N/A] Volume (cm) : [1:0.582] [N/A:N/A] % Reduction in Area: [1:21.60%] [N/A:N/A] % Reduction in Volume: -135.60% [N/A:N/A] Starting Position 1 9 (o'clock): Ending Position 1 [1:11] (o'clock): Maximum Distance 1 1 (cm): Undermining: [1:Yes] [N/A:N/A]  Classification: [1:Grade 1] [N/A:N/A] Exudate Amount: [1:Large] [N/A:N/A] Exudate Type: [1:Serosanguineous] [N/A:N/A] Exudate Color: [1:red, brown] [N/A:N/A] Wound Margin: [1:Distinct, outline attached] [N/A:N/A] Granulation Amount: [1:Medium (34-66%)] [N/A:N/A] Granulation Quality: Red N/A N/A Necrotic Amount: Medium (34-66%) N/A N/A Exposed Structures: Fascia: No N/A N/A Fat: No Tendon: No Muscle: No Joint: No Bone: No Limited to Skin Breakdown Epithelialization: None N/A N/A Periwound Skin Texture: Edema: Yes N/A N/A Periwound Skin Moist: Yes N/A N/A Moisture: Periwound Skin Color: No Abnormalities Noted N/A N/A Temperature: No Abnormality N/A N/A Tenderness on Yes N/A N/A Palpation: Wound Preparation: Ulcer Cleansing: N/A N/A Rinsed/Irrigated with Saline Topical Anesthetic Applied: Other: lidocaine 4% Treatment Notes Electronic Signature(s) Signed: 11/10/2015 5:24:41 PM By: Alric Quan Entered By: Alric Quan on 11/10/2015 12:55:20 Kyllonen, Tyriq RMarland Kitchen ([id number]) -------------------------------------------------------------------------------- [place] Details Patient Name: [name] Date of Service: 11/10/2015 12:45 PM Medical Record Number: [id number] Patient Account Number: [id number] Date of Birth/Sex: [birth year] (75 y.o. Male) Treating RN: Ahmed Prima Primary Care Physician: Jani Gravel Other Clinician: Referring Physician: Jani Gravel Treating Physician/Extender: Frann Rider in Treatment: 3 Active Inactive Abuse / Safety / Falls / Self Care Management Nursing Diagnoses: Potential for falls Goals: Patient will remain injury free Date Initiated: 10/16/2015 Goal Status: Active Interventions: Assess fall risk on admission and as needed Notes: Nutrition Nursing Diagnoses: Imbalanced nutrition Impaired glucose control: actual or potential Goals: Patient/caregiver agrees to and verbalizes understanding of need to use nutritional supplements and/or vitamins as prescribed Date Initiated: 10/16/2015 Goal Status: Active Patient/caregiver verbalizes understanding of need to maintain therapeutic glucose control per primary care physician Date Initiated: 10/16/2015 Goal Status: Active Interventions: Assess patient nutrition upon admission and as needed per policy Provide education on elevated blood sugars and impact on wound healing Notes: Bachmeier, Mallory R. ([id number]) Orientation to the Wound Care Program Nursing Diagnoses: Knowledge deficit related to the wound healing center program Goals: Patient/caregiver will verbalize understanding of the [place] Date Initiated: 10/16/2015 Goal Status: Active Interventions: Provide education on orientation to the wound center Notes: Pain, Acute or Chronic Nursing Diagnoses: Pain, acute or chronic: actual or potential Potential alteration in comfort, pain Goals: Patient will  verbalize adequate pain control and receive pain control interventions during procedures as needed Date Initiated: 10/16/2015 Goal Status: Active Interventions: Assess comfort goal upon admission Complete pain assessment as per visit requirements Notes: Wound/Skin Impairment Nursing Diagnoses: Impaired tissue integrity Goals: Ulcer/skin breakdown will have a volume reduction of 30% by week 4 Date Initiated: 10/16/2015 Goal Status: Active Ulcer/skin breakdown will have a volume reduction of 50% by week 8 Date Initiated: 10/16/2015 Goal Status: Active Ulcer/skin breakdown will have a volume reduction of 80% by week 12 Date Initiated: 10/16/2015 ASBERRY, LASCOLA ([id number]) Goal Status: Active Interventions: Assess patient/caregiver ability to perform ulcer/skin care regimen upon admission and as needed Assess ulceration(s) every visit Notes: Electronic Signature(s) Signed: 11/10/2015 5:24:41 PM By: Alric Quan Entered By: Alric Quan on 11/10/2015 12:55:15 Levels, Suhan RMarland Kitchen ([id number]) -------------------------------------------------------------------------------- Pain Assessment Details Patient Name: [name] Date of Service: 11/10/2015 12:45 PM Medical Record Number: [id number] Patient Account Number: [id number] Date of Birth/Sex: 1940/03/18 (75 y.o. Male) Treating RN: Ahmed Prima Primary Care Physician: Jani Gravel Other Clinician: Referring Physician: Jani Gravel Treating Physician/Extender: Frann Rider in Treatment: 3 Active Problems Location of Pain Severity and Description of Pain Patient Has Paino No Site Locations With Dressing Change: No Pain Management and Medication Current Pain Management: Electronic Signature(s) Signed: 11/10/2015 5:24:41 PM By: Alric Quan  Entered By: Alric Quan on 11/10/2015 12:48:02 Damar, [name]  ([id number]) -------------------------------------------------------------------------------- Patient/Caregiver Education Details Patient Name: [name] Date of Service: 11/10/2015 12:45 PM Medical Record Number: [id number] Patient Account Number: [id number] Date of Birth/Gender: Mar 13, 1940 (75 y.o. Male) Treating RN: Ahmed Prima Primary Care Physician: Jani Gravel Other Clinician: Referring Physician: Jani Gravel Treating Physician/Extender: Frann Rider in Treatment: 3 Education Assessment Education Provided To: Patient Education Topics Provided Wound/Skin Impairment: Handouts: Other: change dressing as ordered Methods: Demonstration, Explain/Verbal Responses: State content correctly Electronic Signature(s) Signed: 11/10/2015 5:24:41 PM By: Alric Quan Entered By: Alric Quan on 11/10/2015 13:06:51 Taliaferro, FlatheadMarland Kitchen ([id number]) -------------------------------------------------------------------------------- Wound Assessment Details Patient Name: [name] Date of Service: 11/10/2015 12:45 PM Medical Record Number: [id number] Patient Account Number: [id number] Date of Birth/Sex: [birth year] (75 y.o. Male) Treating RN: Ahmed Prima Primary Care Physician: Jani Gravel Other Clinician: Referring Physician: Jani Gravel Treating Physician/Extender: Frann Rider in Treatment: 3 Wound Status Wound Number: 1 Primary Diabetic Wound/Ulcer of the Lower Etiology: Extremity Wound Location: Left Lower Leg - Anterior Wound Open Wounding Event: Gradually Appeared Status: Date Acquired: 09/21/2015 Comorbid Cataracts, Anemia, Hypertension, Weeks Of Treatment: 3 History: Type II Diabetes, Osteoarthritis Clustered Wound: No Photos Photo Uploaded By: Alric Quan on 11/10/2015 17:23:53 Wound Measurements Length: (cm) 1.9 Width: (cm) 1.3 Depth: (cm) 0.3 Area: (cm) 1.94 Volume: (cm) 0.582 % Reduction in Area: 21.6% % Reduction in Volume:  -135.6% Epithelialization: None Tunneling: No Undermining: Yes Starting Position (o'clock): 9 Ending Position (o'clock): 11 Maximum Distance: (cm) 1 Wound Description Classification: Grade 1 Wound Margin: Distinct, outline attached Exudate Amount: Large Exudate Type: Serosanguineous Exudate Color: red, brown Foul Odor After Cleansing: No Wound Bed Gatti, Sampson R. ([id number]) Granulation Amount: Medium (34-66%) Exposed Structure Granulation Quality: Red Fascia Exposed: No Necrotic Amount: Medium (34-66%) Fat Layer Exposed: No Necrotic Quality: Adherent Slough Tendon Exposed: No Muscle Exposed: No Joint Exposed: No Bone Exposed: No Limited to Skin Breakdown Periwound Skin Texture Texture Color No Abnormalities Noted: No No Abnormalities Noted: No Localized Edema: Yes Temperature / Pain Moisture Temperature: No Abnormality No Abnormalities Noted: No Tenderness on Palpation: Yes Moist: Yes Wound Preparation Ulcer Cleansing: Rinsed/Irrigated with Saline Topical Anesthetic Applied: Other: lidocaine 4%, Treatment Notes Wound #1 (Left, Anterior Lower Leg) 1. Cleansed with: Clean wound with Normal Saline 2. Anesthetic Topical Lidocaine 4% cream to wound bed prior to debridement 4. Dressing Applied: Aquacel Ag 5. Secondary Dressing Applied ABD Pad Dry Gauze Kerlix/Conform Notes netting Electronic Signature(s) Signed: 11/10/2015 5:24:41 PM By: Alric Quan Entered By: Alric Quan on 11/10/2015 12:54:37 Defrank, Jamiel RMarland Kitchen ([id number]) -------------------------------------------------------------------------------- [place] Details Patient Name: [name] Date of Service: 11/10/2015 12:45 PM Medical Record Number: [id number] Patient Account Number: [id number] Date of Birth/Sex: 04-Nov-1940 (75 y.o. Male) Treating RN: Ahmed Prima Primary Care Physician: Jani Gravel Other Clinician: Referring Physician: Jani Gravel Treating Physician/Extender:  Frann Rider in Treatment: 3 Vital Signs Time Taken: 12:48 Temperature (F): 98.6 Height (in): 70 Pulse (bpm): 70 Weight (lbs): 165.7 Respiratory Rate (breaths/min): 16 Body Mass Index (BMI): 23.8 Blood Pressure (mmHg): 136/54 Reference Range: 80 - 120 mg / dl Notes Made Dr. Con Memos aware of pts diastolic BP. Electronic Signature(s) Signed: 11/10/2015 5:24:41 PM By: Alric Quan Entered By: Alric Quan on 11/10/2015 13:08:25

## 2015-11-14 ENCOUNTER — Encounter: Payer: Self-pay | Admitting: Gastroenterology

## 2015-11-14 ENCOUNTER — Ambulatory Visit (INDEPENDENT_AMBULATORY_CARE_PROVIDER_SITE_OTHER): Payer: Medicare Other | Admitting: Gastroenterology

## 2015-11-14 VITALS — BP 138/62 | HR 78 | Ht 70.0 in | Wt 169.0 lb

## 2015-11-14 DIAGNOSIS — D649 Anemia, unspecified: Secondary | ICD-10-CM | POA: Diagnosis not present

## 2015-11-14 DIAGNOSIS — E119 Type 2 diabetes mellitus without complications: Secondary | ICD-10-CM | POA: Diagnosis not present

## 2015-11-14 DIAGNOSIS — D693 Immune thrombocytopenic purpura: Secondary | ICD-10-CM | POA: Diagnosis not present

## 2015-11-14 DIAGNOSIS — I10 Essential (primary) hypertension: Secondary | ICD-10-CM | POA: Diagnosis not present

## 2015-11-14 DIAGNOSIS — R238 Other skin changes: Secondary | ICD-10-CM | POA: Diagnosis not present

## 2015-11-14 DIAGNOSIS — L03116 Cellulitis of left lower limb: Secondary | ICD-10-CM | POA: Diagnosis not present

## 2015-11-14 DIAGNOSIS — Z792 Long term (current) use of antibiotics: Secondary | ICD-10-CM | POA: Diagnosis not present

## 2015-11-14 DIAGNOSIS — D509 Iron deficiency anemia, unspecified: Secondary | ICD-10-CM

## 2015-11-14 DIAGNOSIS — Z7984 Long term (current) use of oral hypoglycemic drugs: Secondary | ICD-10-CM | POA: Diagnosis not present

## 2015-11-14 DIAGNOSIS — K5520 Angiodysplasia of colon without hemorrhage: Secondary | ICD-10-CM

## 2015-11-14 DIAGNOSIS — Z7901 Long term (current) use of anticoagulants: Secondary | ICD-10-CM | POA: Diagnosis not present

## 2015-11-14 DIAGNOSIS — Z5181 Encounter for therapeutic drug level monitoring: Secondary | ICD-10-CM | POA: Diagnosis not present

## 2015-11-14 DIAGNOSIS — Z79891 Long term (current) use of opiate analgesic: Secondary | ICD-10-CM | POA: Diagnosis not present

## 2015-11-14 DIAGNOSIS — I8500 Esophageal varices without bleeding: Secondary | ICD-10-CM

## 2015-11-14 DIAGNOSIS — K766 Portal hypertension: Secondary | ICD-10-CM

## 2015-11-14 DIAGNOSIS — K7460 Unspecified cirrhosis of liver: Secondary | ICD-10-CM | POA: Diagnosis not present

## 2015-11-14 NOTE — Patient Instructions (Signed)
Please follow up with Dr Fuller Plan in 6 months.  You have been scheduled for an abdominal ultrasound at [facility] (1st floor of hospital) on Wednesday, 11/22/15 at 11:00 am. Please arrive 15 minutes prior to your appointment for registration. Make certain not to have anything to eat or drink 6 hours prior to your appointment. Should you need to reschedule your appointment, please contact radiology at [phone]. This test typically takes about 30 minutes to perform.  If you are age 32 or older, your body mass index should be between 23-30. Your Body mass index is 24.25 kg/m. If this is out of the aforementioned range listed, please consider follow up with your Primary Care Provider.  If you are age 45 or younger, your body mass index should be between 19-25. Your Body mass index is 24.25 kg/m. If this is out of the aformentioned range listed, please consider follow up with your Primary Care Provider.

## 2015-11-14 NOTE — Progress Notes (Signed)
    History of Present Illness: This is a 75 year old male returning for follow-up of cirrhosis. He is accompanied by his wife. He had a recent visit with Dr. Beryle Beams and I have reviewed his note. Dr. Beryle Beams felt his thrombocytopenia was stable and did not require further evaluation. Dr. 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 Patient was hospitalized in August for LLE cellulitis and I reviewed the discharge summary.  Current Medications, Allergies, Past Medical History, Past Surgical History, Family History and Social History were reviewed in [employer] record.  Physical Exam: General: Well developed, well nourished, no acute distress Head: Normocephalic and atraumatic Eyes:  sclerae anicteric, EOMI Ears: Normal auditory acuity Mouth: No deformity or lesions Lungs: Clear throughout to auscultation Heart: Regular rate and rhythm; no murmurs, rubs or bruits Abdomen: Soft, non tender and non distended. No masses, hepatosplenomegaly or hernias noted. Normal Bowel sounds Musculoskeletal: Symmetrical with no gross deformities  Pulses:  Normal pulses noted Extremities: No clubbing, cyanosis, edema or deformities noted Neurological: Alert oriented x 4, grossly nonfocal Psychological:  Alert and cooperative. Normal mood and affect  Assessment and Recommendations:  1. Cirrhosis, likely secondary to NASH. Grade 1 esophageal varices. 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 With esophageal varices he is at higher risk of gastrointestinal bleeding. If Coumadin is not essential it would be ideal if it could be discontinued. The patient will return to Dr. Maudie Mercury to discuss and to review Dr. Azucena Freed recommendations. Schedule abdominal ultrasound for [place] screening. REV in 6 months.     2. 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 Continue iron twice daily. Coumadin anticoagulation likely exacerbates bleeding from colonic AVMs as well and again if Coumadin is not essential it would be ideal if it could be discontinued. Follow-up with Dr. Maudie Mercury.   3. 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  4. Recent left lower extremity cellulitis. Follow-up with Dr. Maudie Mercury.

## 2015-11-16 DIAGNOSIS — Z7984 Long term (current) use of oral hypoglycemic drugs: Secondary | ICD-10-CM | POA: Diagnosis not present

## 2015-11-16 DIAGNOSIS — E119 Type 2 diabetes mellitus without complications: Secondary | ICD-10-CM | POA: Diagnosis not present

## 2015-11-16 DIAGNOSIS — Z792 Long term (current) use of antibiotics: Secondary | ICD-10-CM | POA: Diagnosis not present

## 2015-11-16 DIAGNOSIS — I10 Essential (primary) hypertension: Secondary | ICD-10-CM | POA: Diagnosis not present

## 2015-11-16 DIAGNOSIS — R238 Other skin changes: Secondary | ICD-10-CM | POA: Diagnosis not present

## 2015-11-16 DIAGNOSIS — D649 Anemia, unspecified: Secondary | ICD-10-CM | POA: Diagnosis not present

## 2015-11-16 DIAGNOSIS — K7460 Unspecified cirrhosis of liver: Secondary | ICD-10-CM | POA: Diagnosis not present

## 2015-11-16 DIAGNOSIS — Z5181 Encounter for therapeutic drug level monitoring: Secondary | ICD-10-CM | POA: Diagnosis not present

## 2015-11-16 DIAGNOSIS — D693 Immune thrombocytopenic purpura: Secondary | ICD-10-CM | POA: Diagnosis not present

## 2015-11-16 DIAGNOSIS — Z79891 Long term (current) use of opiate analgesic: Secondary | ICD-10-CM | POA: Diagnosis not present

## 2015-11-16 DIAGNOSIS — L03116 Cellulitis of left lower limb: Secondary | ICD-10-CM | POA: Diagnosis not present

## 2015-11-16 DIAGNOSIS — Z7901 Long term (current) use of anticoagulants: Secondary | ICD-10-CM | POA: Diagnosis not present

## 2015-11-17 ENCOUNTER — Encounter: Payer: Medicare Other | Admitting: Surgery

## 2015-11-17 DIAGNOSIS — Z7984 Long term (current) use of oral hypoglycemic drugs: Secondary | ICD-10-CM | POA: Diagnosis not present

## 2015-11-17 DIAGNOSIS — L97821 Non-pressure chronic ulcer of other part of left lower leg limited to breakdown of skin: Secondary | ICD-10-CM | POA: Diagnosis not present

## 2015-11-17 DIAGNOSIS — Z7901 Long term (current) use of anticoagulants: Secondary | ICD-10-CM | POA: Diagnosis not present

## 2015-11-17 DIAGNOSIS — M1990 Unspecified osteoarthritis, unspecified site: Secondary | ICD-10-CM | POA: Diagnosis not present

## 2015-11-17 DIAGNOSIS — L97222 Non-pressure chronic ulcer of left calf with fat layer exposed: Secondary | ICD-10-CM | POA: Diagnosis not present

## 2015-11-17 DIAGNOSIS — E785 Hyperlipidemia, unspecified: Secondary | ICD-10-CM | POA: Diagnosis not present

## 2015-11-17 DIAGNOSIS — I10 Essential (primary) hypertension: Secondary | ICD-10-CM | POA: Diagnosis not present

## 2015-11-17 DIAGNOSIS — D649 Anemia, unspecified: Secondary | ICD-10-CM | POA: Diagnosis not present

## 2015-11-17 DIAGNOSIS — Z8673 Personal history of transient ischemic attack (TIA), and cerebral infarction without residual deficits: Secondary | ICD-10-CM | POA: Diagnosis not present

## 2015-11-17 DIAGNOSIS — Z86718 Personal history of other venous thrombosis and embolism: Secondary | ICD-10-CM | POA: Diagnosis not present

## 2015-11-17 DIAGNOSIS — E11622 Type 2 diabetes mellitus with other skin ulcer: Secondary | ICD-10-CM | POA: Diagnosis not present

## 2015-11-17 DIAGNOSIS — I890 Lymphedema, not elsewhere classified: Secondary | ICD-10-CM | POA: Diagnosis not present

## 2015-11-17 DIAGNOSIS — Z79899 Other long term (current) drug therapy: Secondary | ICD-10-CM | POA: Diagnosis not present

## 2015-11-17 DIAGNOSIS — I6523 Occlusion and stenosis of bilateral carotid arteries: Secondary | ICD-10-CM | POA: Diagnosis not present

## 2015-11-18 NOTE — Progress Notes (Signed)
[name] ([id number]) Visit Report for 11/17/2015 Chief Complaint Document Details Patient Name: [name], [name] Date of Service: 11/17/2015 12:45 PM Medical Record Number: [id number] Patient Account Number: [id number] Date of Birth/Sex: May 11, 1940 (75 y.o. Male) Treating RN: Ahmed Prima Primary Care Physician: Jani Gravel Other Clinician: Referring Physician: Jani Gravel Treating Physician/Extender: Frann Rider in Treatment: 4 Information Obtained from: Patient Chief Complaint Patients presents for treatment of an open diabetic ulcer left lower extremity with swelling, cellulitis and ulceration for about a month Electronic Signature(s) Signed: 11/17/2015 1:06:18 PM By: Christin Fudge MD, FACS Entered By: Christin Fudge on 11/17/2015 13:06:18 [place], [name] ([id number]) -------------------------------------------------------------------------------- HPI Details Patient Name: [name] Date of Service: 11/17/2015 12:45 PM Medical Record Number: [id number] Patient Account Number: [id number] Date of Birth/Sex: 1940-06-20 (75 y.o. Male) Treating RN: Ahmed Prima Primary Care Physician: Jani Gravel Other Clinician: Referring Physician: Jani Gravel Treating Physician/Extender: Frann Rider in Treatment: 4 History of Present Illness Location: left lower extremity Quality: Patient reports experiencing a dull pain to affected area(s). Severity: Patient states wound are getting worse. Duration: Patient has had the wound for < 4 weeks prior to presenting for treatment Timing: Pain in wound is Intermittent (comes and goes Context: The wound appeared gradually over time Modifying Factors: Other treatment(s) tried include:hospital admission with IV antibiotics and workup Associated Signs and Symptoms: Patient reports having increase swelling. HPI Description: 75 year old gentleman who was been having his primary care taken care of by Dr. Maudie Mercury at [facility] has had recurrent cellulitis the left lower extremity and so far has had 3 attacks. First started in August of this year and was admitted to the ER and sent home with Keflex and. He saw his primary care doctor on August 9 and was referred to [facility] where they began intravenous antibiotics and was admitted to August 15. He at present on doxycycline 100 mg twice a day. 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 Never been a smoker. During the admission he had a ultrasound done to look for a abscess and none was found except for subcutaneous edema and there was no focal muscle fluid collection. Xray of the left lower extremity --IMPRESSION:Soft tissue swelling and stranding. No subcutaneous gas or acute osseous abnormality identified about the left tib-fib. oOf note there was a question of Coumadin induced skin necrosis and has been on Coumadin since the late 90's. Last hemoglobin A1c was 6.7. 10/26/2015 -- arterial and venous duplex studies were done but report is still pending. 11/02/2015 -- a lower extremity venous duplex reflux evaluation -- there was no evidence of DVT or SVT in the left lower extremity. 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 No further Vascular cosultation was recommended for this. Arterial studies done showed he had noncompressible vessels bilaterally and hence ABI was not reliable. The right toe brachial index was 0.56 which was abnormal and the left was 0.81 which was normal. Doppler signals  were triphasic bilaterally both posterior tibial and dorsalis pedis. Electronic Signature(s) LAURENT, CARGILE ([id number]) Signed: 11/17/2015 1:06:24 PM By: Christin Fudge MD, FACS Entered By: Christin Fudge  on 11/17/2015 13:06:24 JERMICHAEL, BELMARES ([id number]) -------------------------------------------------------------------------------- Physical Exam Details Patient Name: [name] Date of Service: 11/17/2015 12:45 PM Medical Record Number: [id number] Patient Account Number: [id number] Date of Birth/Sex: 09-01-1940 (75 y.o. Male) Treating RN: Ahmed Prima Primary Care Physician: Jani Gravel Other Clinician: Referring Physician: Jani Gravel Treating Physician/Extender: Frann Rider in Treatment: 4 Constitutional . Pulse regular. Respirations normal and unlabored. Afebrile. . Eyes Nonicteric. Reactive to light. Ears, Nose, Mouth, and Throat Lips, teeth, and gums WNL.[name] Moist mucosa without lesions. Neck supple and nontender. No palpable supraclavicular or cervical adenopathy. Normal sized without goiter. Respiratory WNL. No retractions.. Cardiovascular Pedal Pulses WNL. No clubbing, cyanosis or edema. Lymphatic No adneopathy. No adenopathy. No adenopathy. Musculoskeletal Adexa without tenderness or enlargement.. Digits and nails w/o clubbing, cyanosis, infection, petechiae, ischemia, or inflammatory conditions.. Integumentary (Hair, Skin) No suspicious lesions. No crepitus or fluctuance. No peri-wound warmth or erythema. No masses.[name] Psychiatric Judgement and insight Intact.. No evidence of depression, anxiety, or agitation.. Notes the undermining is much better this week and I am going to continue packing it lightly with Prisma AG. No sharp debridement was required today. Electronic Signature(s) Signed: 11/17/2015 1:07:20 PM By: Christin Fudge MD, FACS Entered By: Christin Fudge on 11/17/2015 13:07:19 Ruzich, [name]  ([id number]) -------------------------------------------------------------------------------- Physician Orders Details Patient Name: [name] Date of Service: 11/17/2015 12:45 PM Medical Record Number: [id number] Patient Account Number: [id number] Date of Birth/Sex: Jun 29, 1940 (75 y.o. Male) Treating RN: Ahmed Prima Primary Care Physician: Jani Gravel Other Clinician: Referring Physician: Jani Gravel Treating Physician/Extender: Frann Rider in Treatment: 4 Verbal / Phone Orders: Yes Clinician: Pinkerton, Debi Read Back and Verified: Yes Diagnosis Coding Wound Cleansing Wound #1 Left,Anterior Lower Leg o Clean wound with wound cleanser. o Cleanse wound with mild soap and water o May Shower, gently pat wound dry prior to applying new dressing. Anesthetic Wound #1 Left,Anterior Lower Leg o Topical Lidocaine 4% cream applied to wound bed prior to debridement Skin Barriers/Peri-Wound Care Wound #1 Left,Anterior Lower Leg o Skin Prep Primary Wound Dressing Wound #1 Left,Anterior Lower Leg o Prisma Ag - moisten with saline pack into the undermining of wound lightly Secondary Dressing Wound #1 Left,Anterior Lower Leg o Dry Gauze o Boardered Foam Dressing Dressing Change Frequency Wound #1 Left,Anterior Lower Leg o Change dressing every other day. Follow-up Appointments Wound #1 Left,Anterior Lower Leg o Return Appointment in 1 week. Edema Control Wound #1 Left,Anterior Lower Leg o Elevate legs to the level of the heart and pump ankles as often as possible Uplinger, Zaydan R. ([id number]) o Other: - wear compression stockings Additional Orders / Instructions Wound #1 Left,Anterior Lower Leg o Increase protein intake. Home Health Wound #1 [place] Visits - [facility] ([facility] Nurse may visit PRN to address patientos wound care needs. o FACE TO FACE ENCOUNTER: MEDICARE and  MEDICAID PATIENTS: I certify that this patient is under my care and that I had a face-to-face encounter that meets the physician face-to-face encounter requirements with this patient on this date. The encounter with the patient was in whole or in part for the following MEDICAL CONDITION: (primary reason for [place]) MEDICAL NECESSITY: I certify, that based on my findings, NURSING services are a medically necessary home health service. HOME BOUND STATUS: I certify that my clinical findings support that this patient is homebound (i.e., Due to illness or injury, pt requires aid of supportive devices such as crutches, cane, wheelchairs, walkers, the  use of special transportation or the assistance of another person to leave their place of residence. There is a normal inability to leave the home and doing so requires considerable and taxing effort. Other absences are for medical reasons / religious services and are infrequent or of short duration when for other reasons). o If current dressing causes regression in wound condition, may D/C ordered dressing product/s and apply Normal Saline Moist Dressing daily until next [place] / Other MD appointment. [place] of regression in wound condition at [phone]. o Please direct any NON-WOUND related issues/requests for orders to patient's Primary Care Physician Medications-please add to medication list. Wound #1 Left,Anterior Lower Leg o Other: - Vitamin A, Vitamin C, Zinc Electronic Signature(s) Signed: 11/17/2015 3:47:29 PM By: Christin Fudge MD, FACS Signed: 11/17/2015 4:03:29 PM By: Alric Quan Entered By: Alric Quan on 11/17/2015 13:06:30 Persinger, Dorrell RMarland Kitchen ([id number]) -------------------------------------------------------------------------------- Problem List Details Patient Name: [name] Date of Service: 11/17/2015 12:45 PM Medical Record Number: [id number] Patient Account  Number: [id number] Date of Birth/Sex: 1940-08-25 (75 y.o. 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 ([id number]) -------------------------------------------------------------------------------- Progress Note Details Patient Name: [name] Date of Service: 11/17/2015 12:45 PM Medical Record Number: [id number] Patient Account Number: [id number] Date of Birth/Sex: 1940-09-22 (75 y.o. Male) Treating RN: Ahmed Prima Primary Care Physician: Jani Gravel Other Clinician: Referring Physician: Jani Gravel Treating Physician/Extender: Frann Rider in Treatment: 4 Subjective Chief Complaint Information obtained from Patient Patients presents for treatment of an open diabetic ulcer left lower extremity with swelling, cellulitis and ulceration for about a month History of Present Illness (HPI) The following HPI elements were documented for the patient's wound: Location: left lower extremity Quality: Patient reports experiencing a dull pain to affected area(s). Severity: Patient states wound are getting worse. Duration: Patient has had the wound for < 4 weeks prior to presenting for treatment Timing: Pain in wound is Intermittent (comes and goes Context: The wound appeared gradually over time Modifying Factors: Other treatment(s) tried include:hospital admission with IV  antibiotics and workup Associated Signs and Symptoms: Patient reports having increase swelling. 75 year old gentleman who was been having his primary care taken care of by Dr. Maudie Mercury at [facility] has had recurrent cellulitis the left lower extremity and so far has had 3 attacks. First started in August of this year and was admitted to the ER and sent home with Keflex and. He saw his primary care doctor on August 9 and was referred to [facility] where they began intravenous antibiotics and was admitted to August 15. He at present on doxycycline 100 mg twice a day. during his inpatient stay he was prepped with a DVT study which was negative and was treated with vancomycin and Zosyn and discharged home onoral doxycycline for 10 days medical history significant for diabetes mellitus type 2 is controlled by diet, hypertension, hyperlipidemia, bilateral carotid stenosis, mild mitral regurgitation, thrombocytopenia, superior mesenteric wait and portal vein thrombosis, colitis of the left knee, actinic keratosis, right popliteal  DVT, right colon AVM,. Never been a smoker. During the admission he had a ultrasound done to look for a abscess and none was found except for subcutaneous edema and there was no focal muscle fluid collection. Xray of the left lower extremity --IMPRESSION:Soft tissue swelling and stranding. No subcutaneous gas or acute osseous abnormality identified about the left tib-fib. Of note there was a question of Coumadin induced skin necrosis and has been on Coumadin since the late 90's. Last hemoglobin A1c was 6.7. 10/26/2015 -- arterial and venous duplex studies were done but report is still pending. 11/02/2015 -- a lower extremity venous duplex reflux evaluation -- there was no evidence of DVT or SVT in Davidoff, Kellar R. ([id number]) the left lower extremity. 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 No further Vascular cosultation was recommended for  this. Arterial studies done showed he had noncompressible vessels bilaterally and hence ABI was not reliable. The right toe brachial index was 0.56 which was abnormal and the left was 0.81 which was normal. Doppler signals were triphasic bilaterally both posterior tibial and dorsalis pedis. Objective Constitutional Pulse regular. Respirations normal and unlabored. Afebrile. Vitals Time Taken: 12:46 PM, Height: 70 in, Weight: 165.7 lbs, BMI: 23.8, Temperature: 97.6 F, Pulse: 66 bpm, Respiratory Rate: 16 breaths/min, Blood Pressure: 136/47 mmHg. General Notes: Made Dr. Con Memos aware of BP. Eyes Nonicteric. Reactive to light. Ears, Nose, Mouth, and Throat Lips, teeth, and gums WNL.[name] Moist mucosa without lesions. Neck supple and nontender. No palpable supraclavicular or cervical adenopathy. Normal sized without goiter. Respiratory WNL. No retractions.. Cardiovascular Pedal Pulses WNL. No clubbing, cyanosis or edema. Lymphatic No adneopathy. No adenopathy. No adenopathy. Musculoskeletal Adexa without tenderness or enlargement.. Digits and nails w/o clubbing, cyanosis, infection, petechiae, ischemia, or inflammatory conditions.[name] Psychiatric Judgement and insight Intact.. No evidence of depression, anxiety, or agitation.. General Notes: the undermining is much better this week and I am going to continue packing it lightly with Prisma AG. No sharp debridement was required today. Dimaio, Aayan R. ([id number]) Integumentary (Hair, Skin) No suspicious lesions. No crepitus or fluctuance. No peri-wound warmth or erythema. No masses.. Wound #1 status is Open. Original cause of wound was Gradually Appeared. The wound is located on the Left,Anterior Lower Leg. The wound measures 1.5cm length x 0.9cm width x 0.3cm depth; 1.06cm^2 area and 0.318cm^3 volume. The wound is limited to skin breakdown. There is no tunneling noted, however, there is undermining starting at 11:00 and ending at 8:00 with a  maximum distance of 1cm. There is a large amount of serosanguineous drainage noted. The wound margin is distinct with the outline attached to the wound base. There is medium (34-66%) red granulation within the wound bed. There is a medium (34-66%) amount of necrotic tissue within the wound bed including Adherent Slough. The periwound skin appearance exhibited: Localized Edema, Moist. Periwound temperature was noted as No Abnormality. The periwound has tenderness on palpation. Assessment Active Problems ICD-10 E11.622 - Type 2 diabetes mellitus with other skin ulcer L97.222 - Non-pressure chronic ulcer of left calf with fat layer exposed I89.0 - Lymphedema, not elsewhere classified Plan Wound Cleansing: Wound #1 Left,Anterior Lower Leg: Clean wound with wound cleanser. Cleanse wound with mild soap and water May Shower, gently pat wound dry prior to applying new dressing. Anesthetic: Wound #1 Left,Anterior Lower Leg: Topical Lidocaine 4% cream applied to wound bed prior to debridement Skin Barriers/Peri-Wound Care: Wound #1 Left,Anterior Lower Leg: Skin Prep Primary Wound Dressing: Wound #1 Left,Anterior Lower Leg: Prisma Ag -  moisten with saline pack into the undermining of wound lightly Secondary Dressing: Wound #1 Left,Anterior Lower Leg: Dry Gauze Bouska, Boss R. ([id number]) Boardered Foam Dressing Dressing Change Frequency: Wound #1 Left,Anterior Lower Leg: Change dressing every other day. Follow-up Appointments: Wound #1 Left,Anterior Lower Leg: Return Appointment in 1 week. Edema Control: Wound #1 Left,Anterior Lower Leg: Elevate legs to the level of the heart and pump ankles as often as possible Other: - wear compression stockings Additional Orders / Instructions: Wound #1 Left,Anterior Lower Leg: Increase protein intake. Home Health: Wound #1 Left,Anterior Lower Leg: Continue Home Health Visits - [facility] ([facility] [place] Nurse may visit PRN to  address patient s wound care needs. FACE TO FACE ENCOUNTER: MEDICARE and MEDICAID PATIENTS: I certify that this patient is under my care and that I had a face-to-face encounter that meets the physician face-to-face encounter requirements with this patient on this date. The encounter with the patient was in whole or in part for the following MEDICAL CONDITION: (primary reason for [place]) MEDICAL NECESSITY: I certify, that based on my findings, NURSING services are a medically necessary home health service. HOME BOUND STATUS: I certify that my clinical findings support that this patient is homebound (i.e., Due to illness or injury, pt requires aid of supportive devices such as crutches, cane, wheelchairs, walkers, the use of special transportation or the assistance of another person to leave their place of residence. There is a normal inability to leave the home and doing so requires considerable and taxing effort. Other absences are for medical reasons / religious services and are infrequent or of short duration when for other reasons). If current dressing causes regression in wound condition, may D/C ordered dressing product/s and apply Normal Saline Moist Dressing daily until next [place] / Other MD appointment. [place] of regression in wound condition at [phone]. Please direct any NON-WOUND related issues/requests for orders to patient's Primary Care Physician Medications-please add to medication list.: Wound #1 Left,Anterior Lower Leg: Other: - Vitamin A, Vitamin C, Zinc have recommended: 1. to use silver collagen and a bordered foam, to be changed every other day 2. light compression stockings 20-30 mm of Hg to be worn all day 3. protein, vitamin A, vitamin C and zinc 4. regular visits to the wound center He and his wife have had all QUESTIONS answered and will be compliant Vivier, Keysean RMarland Kitchen ([id number]) Electronic Signature(s) Signed:  11/17/2015 1:08:00 PM By: Christin Fudge MD, FACS Entered By: Christin Fudge on 11/17/2015 13:08:00 Koenig, [name] ([id number]) -------------------------------------------------------------------------------- SuperBill Details Patient Name: [name] Date of Service: 11/17/2015 Medical Record Number: [id number] Patient Account Number: [id number] Date of Birth/Sex: 11-09-1940 (75 y.o. Male) Treating RN: Ahmed Prima Primary Care Physician: Jani Gravel Other Clinician: Referring Physician: Jani Gravel Treating Physician/Extender: Frann Rider in Treatment: 4 Diagnosis Coding ICD-10 Codes Code Description [id number] Type 2 diabetes mellitus with other skin ulcer L97.222 Non-pressure chronic ulcer of left calf with fat layer exposed I89.0 Lymphedema, not elsewhere classified Facility Procedures CPT4 Code: [id number] Description: 99213 - WOUND CARE VISIT-LEV 3 EST PT Modifier: Quantity: 1 Physician Procedures CPT4 Code: 4315400 Description: 86761 - WC PHYS LEVEL 3 - EST PT ICD-10 Description Diagnosis E11.622 Type 2 diabetes mellitus with other skin ulcer L97.222 Non-pressure chronic ulcer of left calf with fat I89.0 Lymphedema, not elsewhere classified Modifier: layer exposed Quantity: 1 Electronic Signature(s) Signed: 11/17/2015 3:47:29 PM By: Christin Fudge MD, FACS Signed: 11/17/2015 4:03:29 PM By: Alric Quan  Previous Signature: 11/17/2015 1:08:17 PM Version By: Christin Fudge MD, FACS Entered By: Alric Quan on 11/17/2015 13:26:15

## 2015-11-18 NOTE — Progress Notes (Signed)
Joshua, [name] ([id number]) Visit Report for 11/17/2015 Arrival Information Details Patient Name: [name], [name] Date of Service: 11/17/2015 12:45 PM Medical Record Number: [id number] Patient Account Number: [id number] Date of Birth/Sex: [birth year] (75 y.o. Male) Treating RN: Ahmed Prima Primary Care Physician: Jani Gravel Other Clinician: Referring Physician: Jani Gravel Treating Physician/Extender: Frann Rider in Treatment: 4 Visit Information History Since Last Visit All ordered tests and consults were completed: No Patient Arrived: Ambulatory Added or deleted any medications: No Arrival Time: 12:44 Any new allergies or adverse reactions: No Accompanied By: wife Had a fall or experienced change in No Transfer Assistance: None activities of daily living that may affect Patient Identification Verified: Yes risk of falls: Secondary Verification Process Yes Signs or symptoms of abuse/neglect since last No Completed: visito Patient Requires No Hospitalized since last visit: No Transmission-Based Pain Present Now: No Precautions: Patient Has Alerts: Yes Patient Alerts: Patient on Blood Thinner Warfarin DM II ABIs noncompressible Electronic Signature(s) Signed: 11/17/2015 4:03:29 PM By: Alric Quan Entered By: Alric Quan on 11/17/2015 12:45:00 [name] ([id number]) -------------------------------------------------------------------------------- Clinic Level of Care Assessment Details Patient Name: [name] Date of Service: 11/17/2015 12:45 PM Medical Record Number: [id number] Patient Account Number: [id number] Date of Birth/Sex: 06-25-1940 (75 y.o. Male) Treating RN: Ahmed Prima Primary Care Physician: Jani Gravel Other Clinician: Referring Physician: Jani Gravel Treating Physician/Extender: Frann Rider in Treatment: 4 Clinic Level of Care Assessment Items TOOL 4 Quantity Score X - Use when only an EandM is  performed on FOLLOW-UP visit 1 0 ASSESSMENTS - Nursing Assessment / Reassessment X - Reassessment of Co-morbidities (includes updates in patient status) 1 10 X - Reassessment of Adherence to Treatment Plan 1 5 ASSESSMENTS - Wound and Skin Assessment / Reassessment X - Simple Wound Assessment / Reassessment - one wound 1 5 []  - Complex Wound Assessment / Reassessment - multiple wounds 0 []  - Dermatologic / Skin Assessment (not related to wound area) 0 ASSESSMENTS - Focused Assessment []  - Circumferential Edema Measurements - multi extremities 0 []  - Nutritional Assessment / Counseling / Intervention 0 []  - Lower Extremity Assessment (monofilament, tuning fork, pulses) 0 []  - Peripheral Arterial Disease Assessment (using hand held doppler) 0 ASSESSMENTS - Ostomy and/or Continence Assessment and Care []  - Incontinence Assessment and Management 0 []  - Ostomy Care Assessment and Management (repouching, etc.) 0 PROCESS - Coordination of Care X - Simple Patient / Family Education for ongoing care 1 15 []  - Complex (extensive) Patient / Family Education for ongoing care 0 X - Staff obtains Programmer, systems, Records, Test Results / Process Orders 1 10 X - Staff telephones HHA, Nursing Homes / Clarify orders / etc 1 10 []  - Routine Transfer to another Facility (non-emergent condition) 0 Holzhauer, [name] ([id number]) []  - Routine Hospital Admission (non-emergent condition) 0 []  - New Admissions / Biomedical engineer / Ordering NPWT, Apligraf, etc. 0 []  - Emergency Hospital Admission (emergent condition) 0 X - Simple Discharge Coordination 1 10 []  - Complex (extensive) Discharge Coordination 0 PROCESS - Special Needs []  - Pediatric / Minor Patient Management 0 []  - Isolation Patient Management 0 []  - Hearing / Language / Visual special needs 0 []  - Assessment of Community assistance (transportation, D/C planning, etc.) 0 []  - Additional assistance / Altered mentation 0 []  - Support Surface(s)  Assessment (bed, cushion, seat, etc.) 0 INTERVENTIONS - Wound Cleansing / Measurement X - Simple Wound Cleansing - one wound 1 5 []  - Complex Wound Cleansing - multiple wounds 0  X - Wound Imaging (photographs - any number of wounds) 1 5 []  - Wound Tracing (instead of photographs) 0 X - Simple Wound Measurement - one wound 1 5 []  - Complex Wound Measurement - multiple wounds 0 INTERVENTIONS - Wound Dressings []  - Small Wound Dressing one or multiple wounds 0 X - Medium Wound Dressing one or multiple wounds 1 15 []  - Large Wound Dressing one or multiple wounds 0 X - Application of Medications - topical 1 5 []  - Application of Medications - injection 0 INTERVENTIONS - Miscellaneous []  - External ear exam 0 Denley, [name] ([id number]) []  - Specimen Collection (cultures, biopsies, blood, body fluids, etc.) 0 []  - Specimen(s) / Culture(s) sent or taken to Lab for analysis 0 []  - Patient Transfer (multiple staff / [name] / Similar devices) 0 []  - Simple Staple / Suture removal (25 or less) 0 []  - Complex Staple / Suture removal (26 or more) 0 []  - Hypo / Hyperglycemic Management (close monitor of Blood Glucose) 0 []  - Ankle / Brachial Index (ABI) - do not check if billed separately 0 X - Vital Signs 1 5 Has the patient been seen at the hospital within the last three years: Yes Total Score: 105 Level Of Care: New/Established - Level 3 Electronic Signature(s) Signed: 11/17/2015 4:03:29 PM By: Alric Quan Entered By: Alric Quan on 11/17/2015 13:26:07 [name] ([id number]) -------------------------------------------------------------------------------- Encounter Discharge Information Details Patient Name: [name] Date of Service: 11/17/2015 12:45 PM Medical Record Number: [id number] Patient Account Number: [id number] Date of Birth/Sex: 1940/11/23 (75 y.o. Male) Treating RN: Ahmed Prima Primary Care Physician: Jani Gravel Other Clinician: Referring  Physician: Jani Gravel Treating Physician/Extender: Frann Rider in Treatment: 4 Encounter Discharge Information Items Discharge Pain Level: 0 Discharge Condition: Stable Ambulatory Status: Ambulatory Discharge Destination: Home Transportation: Private Auto Accompanied By: wife Schedule Follow-up Appointment: Yes Medication Reconciliation completed and provided to Patient/Care Yes Carnell Casamento: Provided on Clinical Summary of Care: 11/17/2015 Form Type Recipient Paper Patient BP Electronic Signature(s) Signed: 11/17/2015 1:15:27 PM By: Ruthine Dose Entered By: Ruthine Dose on 11/17/2015 13:15:27 [name], [name] ([id number]) -------------------------------------------------------------------------------- Lower Extremity Assessment Details Patient Name: [name] Date of Service: 11/17/2015 12:45 PM Medical Record Number: [id number] Patient Account Number: [id number] Date of Birth/Sex: [birth year] (75 y.o. Male) Treating RN: Ahmed Prima Primary Care Physician: Jani Gravel Other Clinician: Referring Physician: Jani Gravel Treating Physician/Extender: Frann Rider in Treatment: 4 Vascular Assessment Pulses: Posterior Tibial Dorsalis Pedis Palpable: [Left:Yes] Extremity colors, hair growth, and conditions: Extremity Color: [Left:Hyperpigmented] Temperature of Extremity: [Left:Warm] Capillary Refill: [Left:< 3 seconds] Toe Nail Assessment Left: Right: Thick: No Discolored: No Deformed: No Improper Length and Hygiene: No Electronic Signature(s) Signed: 11/17/2015 4:03:29 PM By: Alric Quan Entered By: Alric Quan on 11/17/2015 12:48:50 [name], [name] ([id number]) -------------------------------------------------------------------------------- Multi Wound Chart Details Patient Name: [name] Date of Service: 11/17/2015 12:45 PM Medical Record Number: [id number] Patient Account Number: [id number] Date of Birth/Sex: August 16, 1940 (75  y.o. Male) Treating RN: Ahmed Prima Primary Care Physician: Jani Gravel Other Clinician: Referring Physician: Jani Gravel Treating Physician/Extender: Frann Rider in Treatment: 4 Vital Signs Height(in): 70 Pulse(bpm): 66 Weight(lbs): 165.7 Blood Pressure 136/47 (mmHg): Body Mass Index(BMI): 24 Temperature(F): 97.6 Respiratory Rate 16 (breaths/min): Photos: [N/A:N/A] Wound Location: Left Lower Leg - Anterior N/A N/A Wounding Event: Gradually Appeared N/A N/A Primary Etiology: Diabetic Wound/Ulcer of N/A N/A the Lower Extremity Comorbid History: Cataracts, Anemia, N/A N/A Hypertension, Type II Diabetes, Osteoarthritis Date Acquired: 09/21/2015 N/A  N/A Weeks of Treatment: 4 N/A N/A Wound Status: Open N/A N/A Measurements L x W x D 1.5x0.9x0.3 N/A N/A (cm) Area (cm) : 1.06 N/A N/A Volume (cm) : 0.318 N/A N/A % Reduction in Area: 57.20% N/A N/A % Reduction in Volume: -28.70% N/A N/A Starting Position 1 11 (o'clock): Ending Position 1 8 (o'clock): Maximum Distance 1 1 (cm): Undermining: Yes N/A N/A Richardson, [name] ([id number]) Classification: Grade 1 N/A N/A Exudate Amount: Large N/A N/A Exudate Type: Serosanguineous N/A N/A Exudate Color: red, brown N/A N/A Wound Margin: Distinct, outline attached N/A N/A Granulation Amount: Medium (34-66%) N/A N/A Granulation Quality: Red N/A N/A Necrotic Amount: Medium (34-66%) N/A N/A Exposed Structures: Fascia: No N/A N/A Fat: No Tendon: No Muscle: No Joint: No Bone: No Limited to Skin Breakdown Epithelialization: None N/A N/A Periwound Skin Texture: Edema: Yes N/A N/A Periwound Skin Moist: Yes N/A N/A Moisture: Periwound Skin Color: No Abnormalities Noted N/A N/A Temperature: No Abnormality N/A N/A Tenderness on Yes N/A N/A Palpation: Wound Preparation: Ulcer Cleansing: N/A N/A Rinsed/Irrigated with Saline Topical Anesthetic Applied: Other: lidocaine 4% Treatment Notes Electronic  Signature(s) Signed: 11/17/2015 4:03:29 PM By: Alric Quan Entered By: Alric Quan on 11/17/2015 13:03:08 Matlock, [name] ([id number]) -------------------------------------------------------------------------------- [place] Details Patient Name: [name] Date of Service: 11/17/2015 12:45 PM Medical Record Number: [id number] Patient Account Number: [id number] Date of Birth/Sex: [birth year] (75 y.o. Male) Treating RN: Ahmed Prima Primary Care Physician: Jani Gravel Other Clinician: Referring Physician: Jani Gravel Treating Physician/Extender: Frann Rider in Treatment: 4 Active Inactive Abuse / Safety / Falls / Self Care Management Nursing Diagnoses: Potential for falls Goals: Patient will remain injury free Date Initiated: 10/16/2015 Goal Status: Active Interventions: Assess fall risk on admission and as needed Notes: Nutrition Nursing Diagnoses: Imbalanced nutrition Impaired glucose control: actual or potential Goals: Patient/caregiver agrees to and verbalizes understanding of need to use nutritional supplements and/or vitamins as prescribed Date Initiated: 10/16/2015 Goal Status: Active Patient/caregiver verbalizes understanding of need to maintain therapeutic glucose control per primary care physician Date Initiated: 10/16/2015 Goal Status: Active Interventions: Assess patient nutrition upon admission and as needed per policy Provide education on elevated blood sugars and impact on wound healing Notes: Ames, [name] ([id number]) Orientation to the Wound Care Program Nursing Diagnoses: Knowledge deficit related to the wound healing center program Goals: Patient/caregiver will verbalize understanding of the [place] Date Initiated: 10/16/2015 Goal Status: Active Interventions: Provide education on orientation to the wound center Notes: Pain, Acute or Chronic Nursing Diagnoses: Pain, acute or  chronic: actual or potential Potential alteration in comfort, pain Goals: Patient will verbalize adequate pain control and receive pain control interventions during procedures as needed Date Initiated: 10/16/2015 Goal Status: Active Interventions: Assess comfort goal upon admission Complete pain assessment as per visit requirements Notes: Wound/Skin Impairment Nursing Diagnoses: Impaired tissue integrity Goals: Ulcer/skin breakdown will have a volume reduction of 30% by week 4 Date Initiated: 10/16/2015 Goal Status: Active Ulcer/skin breakdown will have a volume reduction of 50% by week 8 Date Initiated: 10/16/2015 Goal Status: Active Ulcer/skin breakdown will have a volume reduction of 80% by week 12 Date Initiated: 10/16/2015 [name], [name] ([id number]) Goal Status: Active Interventions: Assess patient/caregiver ability to perform ulcer/skin care regimen upon admission and as needed Assess ulceration(s) every visit Notes: Electronic Signature(s) Signed: 11/17/2015 4:03:29 PM By: Alric Quan Entered By: Alric Quan on 11/17/2015 13:02:46 [name], [name] ([id number]) -------------------------------------------------------------------------------- Pain Assessment Details Patient Name: [name]. Date of Service:  11/17/2015 12:45 PM Medical Record Number: [id number] Patient Account Number: [id number] Date of Birth/Sex: 15-Sep-1940 (75 y.o. Male) Treating RN: Ahmed Prima Primary Care Physician: Jani Gravel Other Clinician: Referring Physician: Jani Gravel Treating Physician/Extender: Frann Rider in Treatment: 4 Active Problems Location of Pain Severity and Description of Pain Patient Has Paino No Site Locations With Dressing Change: No Pain Management and Medication Current Pain Management: Electronic Signature(s) Signed: 11/17/2015 4:03:29 PM By: Alric Quan Entered By: Alric Quan on 11/17/2015 12:45:16 Zegarra, [name]  ([id number]) -------------------------------------------------------------------------------- Patient/Caregiver Education Details Patient Name: [name] Date of Service: 11/17/2015 12:45 PM Medical Record Number: [id number] Patient Account Number: [id number] Date of Birth/Gender: 05-31-1940 (75 y.o. Male) Treating RN: Ahmed Prima Primary Care Physician: Jani Gravel Other Clinician: Referring Physician: Jani Gravel Treating Physician/Extender: Frann Rider in Treatment: 4 Education Assessment Education Provided To: Patient Education Topics Provided Wound/Skin Impairment: Handouts: Other: change dressing as ordered Methods: Demonstration, Explain/Verbal Responses: State content correctly Electronic Signature(s) Signed: 11/17/2015 4:03:29 PM By: Alric Quan Entered By: Alric Quan on 11/17/2015 12:59:18 [name], [name]. ([id number]) -------------------------------------------------------------------------------- Wound Assessment Details Patient Name: [name] Date of Service: 11/17/2015 12:45 PM Medical Record Number: [id number] Patient Account Number: [id number] Date of Birth/Sex: 05-Mar-1940 (75 y.o. Male) Treating RN: Ahmed Prima Primary Care Physician: Jani Gravel Other Clinician: Referring Physician: Jani Gravel Treating Physician/Extender: Frann Rider in Treatment: 4 Wound Status Wound Number: 1 Primary Diabetic Wound/Ulcer of the Lower Etiology: Extremity Wound Location: Left Lower Leg - Anterior Wound Open Wounding Event: Gradually Appeared Status: Date Acquired: 09/21/2015 Comorbid Cataracts, Anemia, Hypertension, Weeks Of Treatment: 4 History: Type II Diabetes, Osteoarthritis Clustered Wound: No Photos Photo Uploaded By: Alric Quan on 11/17/2015 13:01:01 Wound Measurements Length: (cm) 1.5 Width: (cm) 0.9 Depth: (cm) 0.3 Area: (cm) 1.06 Volume: (cm) 0.318 % Reduction in Area: 57.2% % Reduction in Volume:  -28.7% Epithelialization: None Tunneling: No Undermining: Yes Starting Position (o'clock): 11 Ending Position (o'clock): 8 Maximum Distance: (cm) 1 Wound Description Classification: Grade 1 Wound Margin: Distinct, outline attached Exudate Amount: Large Exudate Type: Serosanguineous Exudate Color: red, brown Foul Odor After Cleansing: No Wound Bed [name], [name] ([id number]) Granulation Amount: Medium (34-66%) Exposed Structure Granulation Quality: Red Fascia Exposed: No Necrotic Amount: Medium (34-66%) Fat Layer Exposed: No Necrotic Quality: Adherent Slough Tendon Exposed: No Muscle Exposed: No Joint Exposed: No Bone Exposed: No Limited to Skin Breakdown Periwound Skin Texture Texture Color No Abnormalities Noted: No No Abnormalities Noted: No Localized Edema: Yes Temperature / Pain Moisture Temperature: No Abnormality No Abnormalities Noted: No Tenderness on Palpation: Yes Moist: Yes Wound Preparation Ulcer Cleansing: Rinsed/Irrigated with Saline Topical Anesthetic Applied: Other: lidocaine 4%, Treatment Notes Wound #1 (Left, Anterior Lower Leg) 1. Cleansed with: Clean wound with Normal Saline 2. Anesthetic Topical Lidocaine 4% cream to wound bed prior to debridement 3. Peri-wound Care: Skin Prep 4. Dressing Applied: Prisma Ag 5. Secondary Dressing Applied Bordered Foam Dressing Dry Gauze Electronic Signature(s) Signed: 11/17/2015 4:03:29 PM By: Alric Quan Entered By: Alric Quan on 11/17/2015 12:52:31 Leever, Tyjai RMarland [name] ([id number]) -------------------------------------------------------------------------------- [place] Details Patient Name: [name] Date of Service: 11/17/2015 12:45 PM Medical Record Number: [id number] Patient Account Number: [id number] Date of Birth/Sex: 1940-06-22 (75 y.o. Male) Treating RN: Ahmed Prima Primary Care Physician: Jani Gravel Other Clinician: Referring Physician: Jani Gravel Treating  Physician/Extender: Frann Rider in Treatment: 4 Vital Signs Time Taken: 12:46 Temperature (F): 97.6 Height (in): 70 Pulse (bpm): 66 Weight (lbs): 165.7 Respiratory Rate (breaths/min): 16 Body Mass  Index (BMI): 23.8 Blood Pressure (mmHg): 136/47 Reference Range: 80 - 120 mg / dl Notes Made Dr. Con Memos aware of BP. Electronic Signature(s) Signed: 11/17/2015 4:03:29 PM By: Alric Quan Entered By: Alric Quan on 11/17/2015 12:48:08

## 2015-11-20 DIAGNOSIS — B9562 Methicillin resistant Staphylococcus aureus infection as the cause of diseases classified elsewhere: Secondary | ICD-10-CM | POA: Diagnosis not present

## 2015-11-20 DIAGNOSIS — S81809A Unspecified open wound, unspecified lower leg, initial encounter: Secondary | ICD-10-CM | POA: Diagnosis not present

## 2015-11-20 DIAGNOSIS — L03116 Cellulitis of left lower limb: Secondary | ICD-10-CM | POA: Diagnosis not present

## 2015-11-21 ENCOUNTER — Encounter: Payer: Self-pay | Admitting: Oncology

## 2015-11-21 ENCOUNTER — Ambulatory Visit (INDEPENDENT_AMBULATORY_CARE_PROVIDER_SITE_OTHER): Payer: Medicare Other | Admitting: Oncology

## 2015-11-21 VITALS — BP 148/59 | HR 64 | Temp 97.7°F | Ht 70.0 in | Wt 167.1 lb

## 2015-11-21 DIAGNOSIS — K766 Portal hypertension: Secondary | ICD-10-CM

## 2015-11-21 DIAGNOSIS — D649 Anemia, unspecified: Secondary | ICD-10-CM | POA: Diagnosis not present

## 2015-11-21 DIAGNOSIS — D696 Thrombocytopenia, unspecified: Secondary | ICD-10-CM

## 2015-11-21 DIAGNOSIS — D708 Other neutropenia: Secondary | ICD-10-CM | POA: Diagnosis not present

## 2015-11-21 DIAGNOSIS — K7460 Unspecified cirrhosis of liver: Secondary | ICD-10-CM | POA: Diagnosis not present

## 2015-11-21 DIAGNOSIS — D61818 Other pancytopenia: Secondary | ICD-10-CM | POA: Diagnosis not present

## 2015-11-21 DIAGNOSIS — N189 Chronic kidney disease, unspecified: Secondary | ICD-10-CM

## 2015-11-21 DIAGNOSIS — R161 Splenomegaly, not elsewhere classified: Secondary | ICD-10-CM

## 2015-11-21 DIAGNOSIS — I8510 Secondary esophageal varices without bleeding: Secondary | ICD-10-CM

## 2015-11-21 NOTE — Progress Notes (Signed)
Hematology and Oncology Follow Up Visit  [name] [id number] October 24, 1940 75 y.o. 11/21/2015 10:59 AM   Principle Diagnosis: Encounter Diagnoses  Name Primary?  . Other neutropenia ([place]) Yes  . Normochromic anemia   . Thrombocytopenia ([place])    Interim History:  Elderly gentleman I recently reevaluated on August 31 for a number of complex issues detailed in my office note. 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 This is all a consequence of a remote mesenteric vascular/portal vein thrombosis which occurred in 1996. He has had an extensive recent evaluation to look for other reasons for cirrhosis which has been negative. 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 At time of his last visit I obtained a blood to assess for a underlying myeloproliferative disorder. JAK-2 gene mutations have been associated with portal vein thrombosis. This technology was not available by the time of his clot. The test returned normal with no mutation. He has developed a normochromic anemia in the context of a mild pancytopenia which is also most likely secondary to developing cirrhosis. He does have mild chronic renal insufficiency with estimated GFR 55 mL/m, creatinine 1.2, but an erythropoietin level is elevated at 78.5 done 10/18/2015 which argues against renal insufficiency as a etiology of his anemia. He returns today with his wife to review recent data.  Medications:  reviewed  Allergies: No Known Allergies  Review of Systems: Persistent loss of taste. Remaining ROS negative:   Physical Exam: Blood pressure (!) 148/59, pulse 64, temperature 97.7 F (36.5 C), temperature source Oral, height 5' 10"  (1.778 m), weight 167 lb 1.6 oz (75.8 kg), SpO2 100 %. Wt Readings from Last 3 Encounters:  11/21/15 167 lb 1.6 oz (75.8 kg)  11/14/15 169 lb 0.2 oz (76.7 kg)  10/18/15 168 lb 11.2 oz (76.5 kg)     General appearance: Thin Caucasian man current weight 167 pounds. Compare with 169 pounds on August 30. HENNT: Pharynx no erythema, exudate, mass, or ulcer. No thyromegaly or thyroid nodules Lymph nodes: No cervical, supraclavicular, or axillary lymphadenopathy Breasts:  Lungs: Clear to auscultation, resonant to percussion throughout Heart: Regular rhythm, no murmur, no gallop, no rub, no click, no edema Abdomen: Soft, nontender, normal bowel sounds, no mass, spleen palpable 2-3 centimeters below left costal margin Extremities: 1+ edema, no calf tenderness, healing ulcer about 1.5 cm, mid left shin Musculoskeletal: no joint deformities GU:  Vascular: Carotid pulses 2+, no bruits, distal pulses: Dorsalis pedis 1+ symmetric Neurologic: Alert, oriented,  Skin: No rash or ecchymosis  Lab Results: CBC W/Diff    Component Value Date/Time   WBC 4.5 10/18/2015 1516   WBC 4.2 10/02/2015 0722   RBC 3.34 (L) 10/18/2015 1516   RBC 2.97 (L) 10/02/2015 0722   HGB 9.1 (L) 10/02/2015 0722   HGB 12.9 (L) 01/19/2013 0946   HCT 31.9 (L) 10/18/2015 1516   HCT 37.6 (L) 01/19/2013 0946   PLT 48 (LL) 10/18/2015 1516   MCV 96 10/18/2015 1516   MCV 95.3 01/19/2013 0946   MCH 30.5 10/18/2015 1516  MCH 30.6 10/02/2015 0722   MCHC 32.0 10/18/2015 1516   MCHC 32.2 10/02/2015 0722   RDW 17.1 (H) 10/18/2015 1516   RDW 14.7 (H) 01/19/2013 0946   LYMPHSABS 1.0 10/18/2015 1516   LYMPHSABS 1.2 01/19/2013 0946   MONOABS 0.4 09/25/2015 0806   MONOABS 0.5 01/19/2013 0946    EOSABS 0.1 10/18/2015 1516   BASOSABS 0.0 10/18/2015 1516   BASOSABS 0.0 01/19/2013 0946     Chemistry      Component Value Date/Time   NA 140 10/02/2015 0722   NA 140 09/01/2012 0933   K 4.0 10/02/2015 0722   K 4.6 09/01/2012 0933   CL 111 10/02/2015 0722   CO2 24 10/02/2015 0722   CO2 24 09/01/2012 0933   BUN 14 10/02/2015 0722   BUN 16.2 09/01/2012 0933   CREATININE 1.23 10/02/2015 0722   CREATININE 1.1 09/01/2012 0933      Component Value Date/Time   CALCIUM 8.4 (L) 10/02/2015 0722   CALCIUM 9.2 09/01/2012 0933   ALKPHOS 42 09/30/2015 0750   ALKPHOS 55 09/01/2012 0933   AST 25 09/30/2015 0750   AST 27 09/01/2012 0933   ALT 18 09/30/2015 0750   ALT 28 09/01/2012 0933   BILITOT 0.7 09/30/2015 0750   BILITOT 0.91 09/01/2012 0933       Radiological Studies: No results found.  Impression:  #1. Indolent development of cirrhosis approximately 20 years status post portal vein thrombosis. No obvious evidence for a myeloproliferative disorder.  #2. Portal hypertension secondary to #1 with associated esophageal varices and splenomegaly.  #3. Pancytopenia secondary to #1 and #2. I no longer feel that he has chronic ITP.  #4. Erroneous chart entry with respect to January 2015 right lower extremity DVT. No clear indication for      chronic anticoagulation.  Given his age, I'm going to go ahead and do a myeloma screen today although I think we do have a reasonable explanation for his normochromic anemia.   CC: Patient Care Team: Jani Gravel, MD as PCP - General (Internal Medicine)   Annia Belt, MD 10/3/201710:59 AM

## 2015-11-21 NOTE — Patient Instructions (Signed)
To lab today MD visit in 6 months

## 2015-11-22 ENCOUNTER — Ambulatory Visit (HOSPITAL_COMMUNITY)
Admission: RE | Admit: 2015-11-22 | Discharge: 2015-11-22 | Disposition: A | Discharge status: Home / Self Care | Payer: Medicare Other | Source: Ambulatory Visit | Attending: Gastroenterology | Admitting: Gastroenterology

## 2015-11-22 DIAGNOSIS — K824 Cholesterolosis of gallbladder: Secondary | ICD-10-CM | POA: Diagnosis not present

## 2015-11-22 DIAGNOSIS — D509 Iron deficiency anemia, unspecified: Secondary | ICD-10-CM | POA: Diagnosis not present

## 2015-11-22 DIAGNOSIS — R161 Splenomegaly, not elsewhere classified: Secondary | ICD-10-CM | POA: Insufficient documentation

## 2015-11-22 DIAGNOSIS — K7460 Unspecified cirrhosis of liver: Secondary | ICD-10-CM

## 2015-11-22 LAB — CBC WITH DIFFERENTIAL/PLATELET
BASOS ABS: 0 10*3/uL (ref 0.0–0.2)
Basos: 0 %
EOS (ABSOLUTE): 0.1 10*3/uL (ref 0.0–0.4)
EOS: 3 %
HEMATOCRIT: 32.9 % — AB (ref 37.5–51.0)
Hemoglobin: 11 g/dL — ABNORMAL LOW (ref 12.6–17.7)
IMMATURE GRANS (ABS): 0 10*3/uL (ref 0.0–0.1)
IMMATURE GRANULOCYTES: 0 %
LYMPHS: 22 %
Lymphocytes Absolute: 0.9 10*3/uL (ref 0.7–3.1)
MCH: 31.4 pg (ref 26.6–33.0)
MCHC: 33.4 g/dL (ref 31.5–35.7)
MCV: 94 fL (ref 79–97)
MONOCYTES: 11 %
Monocytes Absolute: 0.5 10*3/uL (ref 0.1–0.9)
NEUTROS ABS: 2.7 10*3/uL (ref 1.4–7.0)
Neutrophils: 64 %
PLATELETS: 48 10*3/uL — AB (ref 150–379)
RBC: 3.5 x10E6/uL — ABNORMAL LOW (ref 4.14–5.80)
RDW: 16.2 % — ABNORMAL HIGH (ref 12.3–15.4)
WBC: 4.2 10*3/uL (ref 3.4–10.8)

## 2015-11-22 LAB — KAPPA/LAMBDA LIGHT CHAINS
IG LAMBDA FREE LIGHT CHAIN: 26.4 mg/L — AB (ref 5.7–26.3)
Ig Kappa Free Light Chain: 35.1 mg/L — ABNORMAL HIGH (ref 3.3–19.4)
Kappa/Lambda FluidC Ratio: 1.33 (ref 0.26–1.65)

## 2015-11-22 LAB — IMMUNOFIXATION ELECTROPHORESIS
IGA/IMMUNOGLOBULIN A, SERUM: 410 mg/dL (ref 61–437)
IGG (IMMUNOGLOBIN G), SERUM: 1041 mg/dL (ref 700–1600)
IgM (Immunoglobulin M), Srm: 88 mg/dL (ref 15–143)
Total Protein: 6.2 g/dL (ref 6.0–8.5)

## 2015-11-23 ENCOUNTER — Telehealth: Payer: Self-pay | Admitting: *Deleted

## 2015-11-23 DIAGNOSIS — R238 Other skin changes: Secondary | ICD-10-CM | POA: Diagnosis not present

## 2015-11-23 DIAGNOSIS — Z7901 Long term (current) use of anticoagulants: Secondary | ICD-10-CM | POA: Diagnosis not present

## 2015-11-23 DIAGNOSIS — Z5181 Encounter for therapeutic drug level monitoring: Secondary | ICD-10-CM | POA: Diagnosis not present

## 2015-11-23 DIAGNOSIS — I10 Essential (primary) hypertension: Secondary | ICD-10-CM | POA: Diagnosis not present

## 2015-11-23 DIAGNOSIS — K7460 Unspecified cirrhosis of liver: Secondary | ICD-10-CM | POA: Diagnosis not present

## 2015-11-23 DIAGNOSIS — Z7984 Long term (current) use of oral hypoglycemic drugs: Secondary | ICD-10-CM | POA: Diagnosis not present

## 2015-11-23 DIAGNOSIS — Z792 Long term (current) use of antibiotics: Secondary | ICD-10-CM | POA: Diagnosis not present

## 2015-11-23 DIAGNOSIS — D693 Immune thrombocytopenic purpura: Secondary | ICD-10-CM | POA: Diagnosis not present

## 2015-11-23 DIAGNOSIS — E119 Type 2 diabetes mellitus without complications: Secondary | ICD-10-CM | POA: Diagnosis not present

## 2015-11-23 DIAGNOSIS — D649 Anemia, unspecified: Secondary | ICD-10-CM | POA: Diagnosis not present

## 2015-11-23 DIAGNOSIS — L03116 Cellulitis of left lower limb: Secondary | ICD-10-CM | POA: Diagnosis not present

## 2015-11-23 DIAGNOSIS — Z79891 Long term (current) use of opiate analgesic: Secondary | ICD-10-CM | POA: Diagnosis not present

## 2015-11-23 NOTE — Telephone Encounter (Signed)
Pt called - spoke to his wife, informed "platelet count no change from his baseline @ 48,000. Red count has improved." per Dr Beryle Beams. Stated thank-you for calling and will let pt know of results.

## 2015-11-23 NOTE — Telephone Encounter (Signed)
-----   Message from Annia Belt, MD sent at 11/22/2015  9:59 AM EDT ----- Call pt: platelet count no change from his baseline @ 48,000. Red count has improved.

## 2015-11-24 ENCOUNTER — Encounter: Payer: Medicare Other | Attending: Surgery | Admitting: Nurse Practitioner

## 2015-11-24 DIAGNOSIS — E11622 Type 2 diabetes mellitus with other skin ulcer: Secondary | ICD-10-CM | POA: Diagnosis not present

## 2015-11-24 DIAGNOSIS — Z8673 Personal history of transient ischemic attack (TIA), and cerebral infarction without residual deficits: Secondary | ICD-10-CM | POA: Diagnosis not present

## 2015-11-24 DIAGNOSIS — E785 Hyperlipidemia, unspecified: Secondary | ICD-10-CM | POA: Insufficient documentation

## 2015-11-24 DIAGNOSIS — Z7984 Long term (current) use of oral hypoglycemic drugs: Secondary | ICD-10-CM | POA: Insufficient documentation

## 2015-11-24 DIAGNOSIS — Z7901 Long term (current) use of anticoagulants: Secondary | ICD-10-CM | POA: Insufficient documentation

## 2015-11-24 DIAGNOSIS — I10 Essential (primary) hypertension: Secondary | ICD-10-CM | POA: Insufficient documentation

## 2015-11-24 DIAGNOSIS — Z86718 Personal history of other venous thrombosis and embolism: Secondary | ICD-10-CM | POA: Diagnosis not present

## 2015-11-24 DIAGNOSIS — Z79899 Other long term (current) drug therapy: Secondary | ICD-10-CM | POA: Insufficient documentation

## 2015-11-24 DIAGNOSIS — I6523 Occlusion and stenosis of bilateral carotid arteries: Secondary | ICD-10-CM | POA: Insufficient documentation

## 2015-11-24 DIAGNOSIS — L97222 Non-pressure chronic ulcer of left calf with fat layer exposed: Secondary | ICD-10-CM | POA: Insufficient documentation

## 2015-11-24 DIAGNOSIS — M1990 Unspecified osteoarthritis, unspecified site: Secondary | ICD-10-CM | POA: Diagnosis not present

## 2015-11-24 DIAGNOSIS — L97822 Non-pressure chronic ulcer of other part of left lower leg with fat layer exposed: Secondary | ICD-10-CM | POA: Diagnosis not present

## 2015-11-24 DIAGNOSIS — D649 Anemia, unspecified: Secondary | ICD-10-CM | POA: Insufficient documentation

## 2015-11-24 DIAGNOSIS — I890 Lymphedema, not elsewhere classified: Secondary | ICD-10-CM | POA: Diagnosis not present

## 2015-11-25 NOTE — Progress Notes (Addendum)
TEVEN, MITTMAN ([id number]) Visit Report for 11/24/2015 Chief Complaint Document Details Patient Name: [name], [name] Date of Service: 11/24/2015 1:30 PM Medical Record Number: [id number] Patient Account Number: [id number] Date of Birth/Sex: 1940/09/05 (75 y.o. Male) Treating RN: Ahmed Prima Primary Care Physician: Jani Gravel Other Clinician: Referring Physician: Jani Gravel Treating Physician/Extender: Loistine Chance in Treatment: 5 Information Obtained from: Patient Chief Complaint Patients presents for treatment of an open diabetic ulcer left lower extremity with swelling, cellulitis and ulceration for about a month Electronic Signature(s) Signed: 11/24/2015 4:21:25 PM By: Londell Moh FNP Entered By: Londell Moh on 11/24/2015 13:58:18 [place], [place]. ([id number]) -------------------------------------------------------------------------------- Debridement Details Patient Name: [name] Date of Service: 11/24/2015 1:30 PM Medical Record Number: [id number] Patient Account Number: [id number] Date of Birth/Sex: October 14, 1940 (75 y.o. Male) Treating RN: Cornell Barman Primary Care Physician: Jani Gravel Other Clinician: Referring Physician: Jani Gravel Treating Physician/Extender: Loistine Chance in Treatment: 5 Debridement Performed for Wound #1 Left,Anterior Lower Leg Assessment: Performed By: Physician Londell Moh, NP Debridement: Debridement Pre-procedure Yes - 13:42 Verification/Time Out Taken: Start Time: 13:43 Pain Control: Other : lidicane 4% Level: Skin/Subcutaneous Tissue Total Area Debrided (L x 1.2 (cm) x 0.8 (cm) = 0.96 (cm) W): Tissue and other Viable, Non-Viable, Eschar, Exudate, Fibrin/Slough, Subcutaneous material debrided: Instrument: Curette Bleeding: Minimum Hemostasis Achieved: Pressure End Time: 13:45 Procedural Pain: 1 Post Procedural Pain: 1 Response to Treatment: Procedure was tolerated well Post  Debridement Measurements of Total Wound Length: (cm) 1.2 Width: (cm) 0.8 Depth: (cm) 0.2 Volume: (cm) 0.151 Character of Wound/Ulcer Post Requires Further Debridement Debridement: Severity of Tissue Post Debridement: Fat layer exposed Post Procedure Diagnosis Same as Pre-procedure Electronic Signature(s) Signed: 11/24/2015 4:21:25 PM By: Londell Moh FNP Signed: 11/24/2015 4:52:48 PM By: Gretta Cool, RN, BSN, Kim RN, BSN Entered By: Gretta Cool, RN, BSN, Kim on 11/24/2015 13:45:27 Maya, Tramel R. ([id number]) [place], [place] Kitchen ([id number]) -------------------------------------------------------------------------------- HPI Details Patient Name: [name] Date of Service: 11/24/2015 1:30 PM Medical Record Number: [id number] Patient Account Number: [id number] Date of Birth/Sex: Jun 07, 1940 (75 y.o. Male) Treating RN: Ahmed Prima Primary Care Physician: Jani Gravel Other Clinician: Referring Physician: Jani Gravel Treating Physician/Extender: Loistine Chance in Treatment: 5 History of Present Illness Location: left lower extremity Quality: Patient reports experiencing a dull pain to affected area(s). Severity: Patient states wound are getting worse. Duration: Patient has had the wound for < 4 weeks prior to presenting for treatment Timing: Pain in wound is Intermittent (comes and goes Context: The wound appeared gradually over time Modifying Factors: Other treatment(s) tried include:hospital admission with IV antibiotics and workup Associated Signs and Symptoms: Patient reports having increase swelling. HPI Description: 75 year old gentleman who was been having his primary care taken care of by Dr. Maudie Mercury at [facility] has had recurrent cellulitis the left lower extremity and so far has had 3 attacks. First started in August of this year and was admitted to the ER and sent home with Keflex and. He saw his primary care doctor on August 9 and was referred to  [facility] where they began intravenous antibiotics and was admitted to August 15. He at present on doxycycline 100 mg twice a day. 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 Never been a smoker. During the admission he had a ultrasound done to look for a abscess and none was found except for subcutaneous edema and there was no focal muscle fluid collection. Xray of the left lower extremity --IMPRESSION:Soft tissue swelling and stranding. No subcutaneous gas or acute osseous abnormality identified about the left tib-fib. oOf note there was a question of Coumadin induced skin necrosis and has been on Coumadin since the late 90's. Last hemoglobin A1c was 6.7. 10/26/2015 -- arterial and venous duplex studies were done but report is still pending. 11/02/2015 -- a lower extremity venous duplex reflux evaluation -- there was no evidence of DVT or SVT in the left lower extremity. 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 No further Vascular cosultation was recommended for this. Arterial studies done showed he had noncompressible vessels bilaterally and hence ABI was not reliable. The right toe brachial index was 0.56 which was abnormal and the left was 0.81 which was normal. Doppler signals were triphasic bilaterally both posterior tibial and dorsalis pedis. 11/24/15: f/u today. no new complaints or concerns. easily palpable peripheral pulses bilaterally. there is development of hypergranulated tissue within the wound bed. denies fever, chills or  body aches. WATARU, MCCOWEN ([id number]) Electronic Signature(s) Signed: 11/24/2015 4:21:25 PM By: Londell Moh FNP Entered By: Londell Moh on 11/24/2015 13:59:25 Wirthlin, Benjamin RMarland Kitchen ([id number]) -------------------------------------------------------------------------------- Otelia Sergeant TISS Details Patient Name: [name] Date of Service: 11/24/2015 1:30 PM Medical Record Number: [id number] Patient Account Number: [id number] Date of Birth/Sex: 1940/07/07 (75 y.o. Male) Treating RN: Cornell Barman Primary Care Physician: Jani Gravel Other Clinician: Referring Physician: Jani Gravel Treating Physician/Extender: Loistine Chance in Treatment: 5 Procedure Performed for: Wound #1 Left,Anterior Lower Leg Performed By: Physician Londell Moh, NP Post Procedure Diagnosis Same as Pre-procedure Electronic Signature(s) Signed: 11/24/2015 4:52:48 PM By: Gretta Cool RN, BSN, Kim RN, BSN Entered By: Gretta Cool, RN, BSN, Kim on 11/24/2015 13:45:47 Leland, [name] ([id number]) -------------------------------------------------------------------------------- Physician Orders Details Patient Name: [name] Date of Service: 11/24/2015 1:30 PM Medical Record Number: [id number] Patient Account Number: [id number] Date of Birth/Sex: 05-09-1940 (75 y.o. Male) Treating RN: Cornell Barman Primary Care Physician: Jani Gravel Other Clinician: Referring Physician: Jani Gravel Treating Physician/Extender: Loistine Chance in Treatment: 5 Verbal / Phone Orders: Yes Clinician: Cornell Barman Read Back and Verified: Yes Diagnosis Coding Wound Cleansing Wound #1 Left,Anterior Lower Leg o Clean wound with wound cleanser. o Cleanse wound with mild soap and water o May Shower, gently pat wound dry prior to applying new dressing. Anesthetic Wound #1 Left,Anterior Lower Leg o Topical Lidocaine 4% cream applied to wound bed prior to debridement Skin Barriers/Peri-Wound  Care Wound #1 Left,Anterior Lower Leg o Skin Prep Primary Wound Dressing Wound #1 Left,Anterior Lower Leg o Hydrafera Blue Secondary Dressing Wound #1 Left,Anterior Lower Leg o Boardered Foam Dressing - telfa island Dressing Change Frequency Wound #1 Left,Anterior Lower Leg o Three times weekly Follow-up Appointments Wound #1 Left,Anterior Lower Leg o Return Appointment in 1 week. Edema Control Wound #1 Left,Anterior Lower Leg o Elevate legs to the level of the heart and pump ankles as often as possible o Other: - wear compression stockings Ben, Fawzi R. ([id number]) Additional Orders / Instructions Wound #1 Left,Anterior Lower Leg o Increase protein intake. Home Health Wound #1 [place] Visits - [facility] ([facility] Nurse may  visit PRN to address patientos wound care needs. o FACE TO FACE ENCOUNTER: MEDICARE and MEDICAID PATIENTS: I certify that this patient is under my care and that I had a face-to-face encounter that meets the physician face-to-face encounter requirements with this patient on this date. The encounter with the patient was in whole or in part for the following MEDICAL CONDITION: (primary reason for [place]) MEDICAL NECESSITY: I certify, that based on my findings, NURSING services are a medically necessary home health service. HOME BOUND STATUS: I certify that my clinical findings support that this patient is homebound (i.e., Due to illness or injury, pt requires aid of supportive devices such as crutches, cane, wheelchairs, walkers, the use of special transportation or the assistance of another person to leave their place of residence. There is a normal inability to leave the home and doing so requires considerable and taxing effort. Other absences are for medical reasons / religious services and are infrequent or of short duration when for other reasons). o If current  dressing causes regression in wound condition, may D/C ordered dressing product/s and apply Normal Saline Moist Dressing daily until next [place] / Other MD appointment. [place] of regression in wound condition at [phone]. o Please direct any NON-WOUND related issues/requests for orders to patient's Primary Care Physician Medications-please add to medication list. Wound #1 Left,Anterior Lower Leg o Other: - Vitamin A, Vitamin C, Zinc Electronic Signature(s) Signed: 11/24/2015 4:21:25 PM By: Londell Moh FNP Signed: 11/24/2015 4:52:48 PM By: Gretta Cool RN, BSN, Kim RN, BSN Entered By: Gretta Cool, RN, BSN, Kim on 11/24/2015 13:52:15 Mergen, Dhairya Alfonso Patten ([id number]) -------------------------------------------------------------------------------- Problem List Details Patient Name: [name] Date of Service: 11/24/2015 1:30 PM Medical Record Number: [id number] Patient Account Number: [id number] Date of Birth/Sex: 1940/04/29 (75 y.o. 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 Male) Treating RN: Ahmed Prima Primary Care Physician: Jani Gravel Other Clinician: Referring Physician: Jani Gravel Treating Physician/Extender: Loistine Chance in Treatment: 5 Subjective Chief Complaint Information obtained from Patient Patients presents for treatment of an open diabetic ulcer left lower extremity with swelling, cellulitis and ulceration for about a month History of Present Illness (HPI) The following HPI elements were documented for the patient's wound: Location: left lower extremity Quality: Patient reports experiencing a dull pain to affected area(s). Severity: Patient states wound are getting worse. Duration: Patient has had the wound for < 4 weeks prior to presenting for treatment Timing: Pain in wound is Intermittent (comes and goes Context: The wound appeared gradually over time Modifying Factors: Other treatment(s) tried include:hospital admission with IV antibiotics and workup Associated Signs and Symptoms: Patient reports having increase swelling. 75 year old gentleman who was been having his primary care taken care of by Dr. Maudie Mercury at [facility] has had recurrent cellulitis the left lower extremity and so far has had  3 attacks. First started in August of this year and was admitted to the ER and sent home with Keflex and. He saw his primary care doctor on August 9 and was referred to [facility] where they began intravenous antibiotics and was admitted to August 15. He at present on doxycycline 100 mg twice a day. during his inpatient stay he was prepped with a DVT study which was negative and was treated with vancomycin and Zosyn and discharged home onoral doxycycline for 10 days medical history significant for diabetes mellitus type 2 is controlled by diet, hypertension, hyperlipidemia, bilateral carotid stenosis, mild mitral regurgitation, thrombocytopenia, superior mesenteric wait and portal vein thrombosis,  colitis of the left knee, actinic keratosis, right popliteal DVT, right colon AVM,. Never been a smoker. During the admission he had a ultrasound done to look for a abscess and none was found except for subcutaneous edema and there was no focal muscle fluid collection. Xray of the left lower extremity --IMPRESSION:Soft tissue swelling and stranding. No subcutaneous gas or acute osseous abnormality identified about the left tib-fib. Of note there was a question of Coumadin induced skin necrosis and has been on Coumadin since the late 90's. Last hemoglobin A1c was 6.7. 10/26/2015 -- arterial and venous duplex studies were done but report is still pending. 11/02/2015 -- a lower extremity venous duplex reflux evaluation -- there was no evidence of DVT or SVT in Meggett, Iann R. ([id number]) the left lower extremity. 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 No further Vascular cosultation was recommended for this. Arterial studies done showed he had noncompressible vessels bilaterally and hence ABI was not reliable. The right toe brachial index was 0.56 which was abnormal and the left was 0.81 which was normal. Doppler signals were triphasic bilaterally both posterior tibial and dorsalis pedis. 11/24/15: f/u today. no new complaints or concerns. easily palpable peripheral pulses bilaterally. there is development of hypergranulated tissue within the wound bed. denies fever, chills or body aches. Objective Constitutional Vitals Time Taken: 1:31 PM, Height: 70 in, Weight: 165.7 lbs, BMI: 23.8, Temperature: 98.0 F, Pulse: 67 bpm, Respiratory Rate: 16 breaths/min, Blood Pressure: 150/48 mmHg. Integumentary (Hair, Skin) Wound #1 status is Open. Original cause of wound was Gradually Appeared. The wound is located on the Left,Anterior Lower Leg. The wound measures 1.2cm length x 0.8cm width x 0.2cm depth; 0.754cm^2 area and 0.151cm^3 volume. The wound is limited to skin breakdown. There  is no tunneling or undermining noted. There is a large amount of serosanguineous drainage noted. The wound margin is distinct with the outline attached to the wound base. There is medium (34-66%) red granulation within the wound bed. There is a medium (34-66%) amount of necrotic tissue within the wound bed including Adherent Slough. The periwound skin appearance exhibited: Induration, Ecchymosis. The periwound skin appearance did not exhibit: Callus, Crepitus, Excoriation, Fluctuance, Friable, Localized Edema, Rash, Scarring, Dry/Scaly, Maceration, Moist, Atrophie Blanche, Cyanosis, Hemosiderin Staining, Mottled, Pallor, Rubor, Erythema. Periwound temperature was noted as No Abnormality. The periwound has tenderness on palpation. Assessment Active Problems ICD-10 E11.622 - Type 2 diabetes mellitus with other skin ulcer L97.222 - Non-pressure chronic ulcer of left calf with fat layer exposed I89.0 - Lymphedema, not elsewhere classified Diagnoses ICD-10 E11.622: Type 2 diabetes mellitus with other skin ulcer L97.222: Non-pressure chronic ulcer of left calf with fat layer exposed Hotard, Crispin R. ([id number]) I89.0: Lymphedema, not elsewhere classified Procedures Wound #1 Wound #1 is a Diabetic Wound/Ulcer of the Lower Extremity located on the Left,Anterior  Lower Leg . There was a Skin/Subcutaneous Tissue Debridement ([id number]) debridement with total area of 0.96 sq cm performed by Londell Moh, NP. with the following instrument(s): Curette to remove Viable and Non- Viable tissue/material including Exudate, Fibrin/Slough, Eschar, and Subcutaneous after achieving pain control using Other (lidicane 4%). A time out was conducted at 13:42, prior to the start of the procedure. A Minimum amount of bleeding was controlled with Pressure. The procedure was tolerated well with a pain level of 1 throughout and a pain level of 1 following the procedure. Post Debridement Measurements:  1.2cm length x 0.8cm width x 0.2cm depth; 0.151cm^3 volume. Character of Wound/Ulcer Post Debridement requires further debridement. Severity of Tissue Post Debridement is: Fat layer exposed. Post procedure Diagnosis Wound #1: Same as Pre-Procedure Wound #1 is a Diabetic Wound/Ulcer of the Lower Extremity located on the Left,Anterior Lower Leg . An CHEM CAUT GRANULATION TISS procedure was performed by Londell Moh, NP. Post procedure Diagnosis Wound #1: Same as Pre-Procedure Plan Wound Cleansing: Wound #1 Left,Anterior Lower Leg: Clean wound with wound cleanser. Cleanse wound with mild soap and water May Shower, gently pat wound dry prior to applying new dressing. Anesthetic: Wound #1 Left,Anterior Lower Leg: Topical Lidocaine 4% cream applied to wound bed prior to debridement Skin Barriers/Peri-Wound Care: Wound #1 Left,Anterior Lower Leg: Skin Prep Primary Wound Dressing: Wound #1 Left,Anterior Lower Leg: Hydrafera Blue Secondary Dressing: Wound #1 Left,Anterior Lower Leg: Boardered Foam Dressing - telfa island Lill, Sherrod R. ([id number]) Dressing Change Frequency: Wound #1 Left,Anterior Lower Leg: Three times weekly Follow-up Appointments: Wound #1 Left,Anterior Lower Leg: Return Appointment in 1 week. Edema Control: Wound #1 Left,Anterior Lower Leg: Elevate legs to the level of the heart and pump ankles as often as possible Other: - wear compression stockings Additional Orders / Instructions: Wound #1 Left,Anterior Lower Leg: Increase protein intake. Home Health: Wound #1 Left,Anterior Lower Leg: Continue Home Health Visits - [facility] ([facility] [place] Nurse may visit PRN to address patient s wound care needs. FACE TO FACE ENCOUNTER: MEDICARE and MEDICAID PATIENTS: I certify that this patient is under my care and that I had a face-to-face encounter that meets the physician face-to-face encounter requirements with this patient on this date. The  encounter with the patient was in whole or in part for the following MEDICAL CONDITION: (primary reason for [place]) MEDICAL NECESSITY: I certify, that based on my findings, NURSING services are a medically necessary home health service. HOME BOUND STATUS: I certify that my clinical findings support that this patient is homebound (i.e., Due to illness or injury, pt requires aid of supportive devices such as crutches, cane, wheelchairs, walkers, the use of special transportation or the assistance of another person to leave their place of residence. There is a normal inability to leave the home and doing so requires considerable and taxing effort. Other absences are for medical reasons / religious services and are infrequent or of short duration when for other reasons). If current dressing causes regression in wound condition, may D/C ordered dressing product/s and apply Normal Saline Moist Dressing daily until next [place] / Other MD appointment. [place] of regression in wound condition at [phone]. Please direct any NON-WOUND related issues/requests for orders to patient's Primary Care Physician Medications-please add to medication list.: Wound #1 Left,Anterior Lower Leg: Other: - Vitamin A, Vitamin C, Zinc Follow-Up Appointments: A follow-up appointment should be scheduled. Medication Reconciliation completed and provided to Patient/Care Provider. A Patient Clinical Summary of  Care was provided to bp 1. discussed clinical findings and implications with pt and wife. all questions were answered. Kroon, Evaristo R. ([id number]) 2. Level 2 debridement performed today revealing 100% red hypergranulated tissue. this was chemically cauterized for management. 3. d/c previous dressing orders. Hydrofera blue to wound. see orders above. Electronic Signature(s) Signed: 12/13/2015 3:57:14 PM By: Londell Moh FNP Previous Signature: 11/24/2015 4:21:25 PM  Version By: Londell Moh FNP Entered By: Londell Moh on 12/13/2015 15:57:14 Jantz, Daisy RMarland Kitchen ([id number]) -------------------------------------------------------------------------------- SuperBill Details Patient Name: [name] Date of Service: 11/24/2015 Medical Record Number: [id number] Patient Account Number: [id number] Date of Birth/Sex: 1940/04/10 (75 y.o. Male) Treating RN: Ahmed Prima Primary Care Physician: Jani Gravel Other Clinician: Referring Physician: Jani Gravel Treating Physician/Extender: Loistine Chance in Treatment: 5 Diagnosis Coding ICD-10 Codes Code Description E11.622 Type 2 diabetes mellitus with other skin ulcer L97.222 Non-pressure chronic ulcer of left calf with fat layer exposed I89.0 Lymphedema, not elsewhere classified Facility Procedures CPT4 Code: 41030131 Description: 43888 - DEB SUBQ TISSUE 20 SQ CM/< ICD-10 Description Diagnosis E11.622 Type 2 diabetes mellitus with other skin ulcer L97.222 Non-pressure chronic ulcer of left calf with fat l Modifier: ayer exposed Quantity: 1 CPT4 Code: 75797282 Description: 06015 - CHEM CAUT GRANULATION TISS ICD-10 Description Diagnosis E11.622 Type 2 diabetes mellitus with other skin ulcer L97.222 Non-pressure chronic ulcer of left calf with fat l Modifier: ayer exposed Quantity: 2 Physician Procedures CPT4 Code: 6153794 Description: [facility] - WC PHYS SUBQ TISS 20 SQ CM ICD-10 Description Diagnosis E11.622 Type 2 diabetes mellitus with other skin ulcer L97.222 Non-pressure chronic ulcer of left calf with fat la Modifier: yer exposed Quantity: 1 CPT4 Code: [id number] Kotter, BOB Description: 17250 - WC PHYS CHEM CAUT GRAN TISSUE ICD-10 Description Diagnosis E11.622 Type 2 diabetes mellitus with other skin ulcer L97.222 Non-pressure chronic ulcer of left calf with fat la BY R. ([id number]) Modifier: yer exposed Quantity: 2 Electronic Signature(s) Signed: 11/24/2015 4:21:25 PM By: Londell Moh FNP Entered By: Londell Moh on 11/24/2015 14:01:49

## 2015-11-25 NOTE — Progress Notes (Signed)
[name], Joshua ([id number]) Visit Report for 11/24/2015 Arrival Information Details Patient Name: [name], [name] Date of Service: 11/24/2015 1:30 PM Medical Record Number: [id number] Patient Account Number: [id number] Date of Birth/Sex: Dec 07, 1940 (75 y.o. Male) Treating RN: Cornell Barman Primary Care Physician: Jani Gravel Other Clinician: Referring Physician: Jani Gravel Treating Physician/Extender: Loistine Chance in Treatment: 5 Visit Information History Since Last Visit Added or deleted any medications: No Patient Arrived: Ambulatory Any new allergies or adverse reactions: No Arrival Time: 13:31 Had a fall or experienced change in No Transfer Assistance: None activities of daily living that may affect Patient Identification Verified: Yes risk of falls: Secondary Verification Process Yes Signs or symptoms of abuse/neglect since last No Completed: visito Patient Requires No Has Dressing in Place as Prescribed: Yes Transmission-Based Pain Present Now: No Precautions: Patient Has Alerts: Yes Patient Alerts: Patient on Blood Thinner Warfarin DM II ABIs noncompressible Electronic Signature(s) Signed: 11/24/2015 4:52:48 PM By: Gretta Cool, RN, BSN, Kim RN, BSN Entered By: Gretta Cool, RN, BSN, Kim on 11/24/2015 13:31:40 Kocian, [name] ([id number]) -------------------------------------------------------------------------------- Encounter Discharge Information Details Patient Name: [name] Date of Service: 11/24/2015 1:30 PM Medical Record Number: [id number] Patient Account Number: [id number] Date of Birth/Sex: [birth year] (75 y.o. Male) Treating RN: Cornell Barman Primary Care Physician: Jani Gravel Other Clinician: Referring Physician: Jani Gravel Treating Physician/Extender: Loistine Chance in Treatment: 5 Encounter Discharge Information Items Discharge Pain Level: 1 Discharge Condition: Stable Ambulatory Status: Ambulatory Discharge Destination:  Home Transportation: Private Auto Accompanied By: wife Schedule Follow-up Appointment: Yes Medication Reconciliation completed and provided to Patient/Care Yes Aries Kasa: Provided on Clinical Summary of Care: 11/24/2015 Form Type Recipient Paper Patient bp Electronic Signature(s) Signed: 11/24/2015 1:54:14 PM By: Lorine Bears RCP, RRT, CHT Entered By: Becky Sax, Amado Nash on 11/24/2015 13:54:14 Fines, Keymon RMarland Kitchen ([id number]) -------------------------------------------------------------------------------- Lower Extremity Assessment Details Patient Name: [name] Date of Service: 11/24/2015 1:30 PM Medical Record Number: [id number] Patient Account Number: [id number] Date of Birth/Sex: 11-May-1940 (75 y.o. Male) Treating RN: Cornell Barman Primary Care Physician: Jani Gravel Other Clinician: Referring Physician: Jani Gravel Treating Physician/Extender: Loistine Chance in Treatment: 5 Vascular Assessment Pulses: Posterior Tibial Dorsalis Pedis Palpable: [Left:Yes] Extremity colors, hair growth, and conditions: Extremity Color: [Left:Normal] Hair Growth on Extremity: [Left:No] Temperature of Extremity: [Left:Warm] Dependent Rubor: [Left:No] Blanched when Elevated: [Left:No] Lipodermatosclerosis: [Left:No] Notes Patient kept compression sock on foot. Electronic Signature(s) Signed: 11/24/2015 4:52:48 PM By: Gretta Cool, RN, BSN, Kim RN, BSN Entered By: Gretta Cool, RN, BSN, Kim on 11/24/2015 13:33:02 Lovern, [name] ([id number]) -------------------------------------------------------------------------------- Multi Wound Chart Details Patient Name: [name] Date of Service: 11/24/2015 1:30 PM Medical Record Number: [id number] Patient Account Number: [id number] Date of Birth/Sex: [birth year] (75 y.o. Male) Treating RN: Cornell Barman Primary Care Physician: Jani Gravel Other Clinician: Referring Physician: Jani Gravel Treating Physician/Extender:  Loistine Chance in Treatment: 5 Vital Signs Height(in): 70 Pulse(bpm): 67 Weight(lbs): 165.7 Blood Pressure 150/48 (mmHg): Body Mass Index(BMI): 24 Temperature(F): 98.0 Respiratory Rate 16 (breaths/min): Photos: [N/A:N/A] Wound Location: Left Lower Leg - Anterior N/A N/A Wounding Event: Gradually Appeared N/A N/A Primary Etiology: Diabetic Wound/Ulcer of N/A N/A the Lower Extremity Comorbid History: Cataracts, Anemia, N/A N/A Hypertension, Type II Diabetes, Osteoarthritis Date Acquired: 09/21/2015 N/A N/A Weeks of Treatment: 5 N/A N/A Wound Status: Open N/A N/A Measurements L x W x D 1.2x0.8x0.2 N/A N/A (cm) Area (cm) : 0.754 N/A N/A Volume (cm) : 0.151 N/A N/A % Reduction in Area: 69.50% N/A N/A % Reduction in Volume: 38.90% N/A N/A  Classification: Grade 1 N/A N/A Exudate Amount: Large N/A N/A Exudate Type: Serosanguineous N/A N/A Exudate Color: red, brown N/A N/A Wound Margin: Distinct, outline attached N/A N/A Granulation Amount: Medium (34-66%) N/A N/A Granulation Quality: Red N/A N/A Necrotic Amount: Medium (34-66%) N/A N/A Croswell, Seung R. ([id number]) Exposed Structures: Fascia: No N/A N/A Fat: No Tendon: No Muscle: No Joint: No Bone: No Limited to Skin Breakdown Epithelialization: Small (1-33%) N/A N/A Periwound Skin Texture: Induration: Yes N/A N/A Edema: No Excoriation: No Callus: No Crepitus: No Fluctuance: No Friable: No Rash: No Scarring: No Periwound Skin Maceration: No N/A N/A Moisture: Moist: No Dry/Scaly: No Periwound Skin Color: Ecchymosis: Yes N/A N/A Atrophie Blanche: No Cyanosis: No Erythema: No Hemosiderin Staining: No Mottled: No Pallor: No Rubor: No Temperature: No Abnormality N/A N/A Tenderness on Yes N/A N/A Palpation: Wound Preparation: Ulcer Cleansing: N/A N/A Rinsed/Irrigated with Saline Topical Anesthetic Applied: Other: lidocaine 4% Treatment Notes Electronic Signature(s) Signed: 11/24/2015  4:52:48 PM By: Gretta Cool, RN, BSN, Kim RN, BSN Entered By: Gretta Cool, RN, BSN, Kim on 11/24/2015 13:42:36 Short, [name] ([id number]) -------------------------------------------------------------------------------- Multi-Disciplinary Care Plan Details Patient Name: [name] Date of Service: 11/24/2015 1:30 PM Medical Record Number: [id number] Patient Account Number: [id number] Date of Birth/Sex: 06/29/1940 (75 y.o. Male) Treating RN: Cornell Barman Primary Care Physician: Jani Gravel Other Clinician: Referring Physician: Jani Gravel Treating Physician/Extender: Loistine Chance in Treatment: 5 Active Inactive Abuse / Safety / Falls / Self Care Management Nursing Diagnoses: Potential for falls Goals: Patient will remain injury free Date Initiated: 10/16/2015 Goal Status: Active Interventions: Assess fall risk on admission and as needed Notes: Nutrition Nursing Diagnoses: Imbalanced nutrition Impaired glucose control: actual or potential Goals: Patient/caregiver agrees to and verbalizes understanding of need to use nutritional supplements and/or vitamins as prescribed Date Initiated: 10/16/2015 Goal Status: Active Patient/caregiver verbalizes understanding of need to maintain therapeutic glucose control per primary care physician Date Initiated: 10/16/2015 Goal Status: Active Interventions: Assess patient nutrition upon admission and as needed per policy Provide education on elevated blood sugars and impact on wound healing Notes: Noy, Sie R. ([id number]) Orientation to the Wound Care Program Nursing Diagnoses: Knowledge deficit related to the wound healing center program Goals: Patient/caregiver will verbalize understanding of the [facility] Date Initiated: 10/16/2015 Goal Status: Active Interventions: Provide education on orientation to the wound center Notes: Pain, Acute or Chronic Nursing Diagnoses: Pain, acute or chronic: actual or  potential Potential alteration in comfort, pain Goals: Patient will verbalize adequate pain control and receive pain control interventions during procedures as needed Date Initiated: 10/16/2015 Goal Status: Active Interventions: Assess comfort goal upon admission Complete pain assessment as per visit requirements Notes: Wound/Skin Impairment Nursing Diagnoses: Impaired tissue integrity Goals: Ulcer/skin breakdown will have a volume reduction of 30% by week 4 Date Initiated: 10/16/2015 Goal Status: Active Ulcer/skin breakdown will have a volume reduction of 50% by week 8 Date Initiated: 10/16/2015 Goal Status: Active Ulcer/skin breakdown will have a volume reduction of 80% by week 12 Date Initiated: 10/16/2015 QUEST, TAVENNER ([id number]) Goal Status: Active Interventions: Assess patient/caregiver ability to perform ulcer/skin care regimen upon admission and as needed Assess ulceration(s) every visit Notes: Electronic Signature(s) Signed: 11/24/2015 4:52:48 PM By: Gretta Cool, RN, BSN, Kim RN, BSN Entered By: Gretta Cool, RN, BSN, Kim on 11/24/2015 13:35:43 Hagemeister, Amardeep Alfonso Patten ([id number]) -------------------------------------------------------------------------------- Pain Assessment Details Patient Name: [name] Date of Service: 11/24/2015 1:30 PM Medical Record Number: [id number] Patient Account Number: [id number] Date of Birth/Sex: 1940-03-05 (75 y.o. Male) Treating  RN: Cornell Barman Primary Care Physician: Jani Gravel Other Clinician: Referring Physician: Jani Gravel Treating Physician/Extender: Loistine Chance in Treatment: 5 Active Problems Location of Pain Severity and Description of Pain Patient Has Paino No Site Locations With Dressing Change: No Pain Management and Medication Current Pain Management: Electronic Signature(s) Signed: 11/24/2015 4:52:48 PM By: Gretta Cool, RN, BSN, Kim RN, BSN Entered By: Gretta Cool, RN, BSN, Kim on 11/24/2015 13:31:48 Gunnerson, [name]  ([id number]) -------------------------------------------------------------------------------- Patient/Caregiver Education Details Patient Name: [name] Date of Service: 11/24/2015 1:30 PM Medical Record Number: [id number] Patient Account Number: [id number] Date of Birth/Gender: [birth year] (75 y.o. Male) Treating RN: Cornell Barman Primary Care Physician: Jani Gravel Other Clinician: Referring Physician: Jani Gravel Treating Physician/Extender: Loistine Chance in Treatment: 5 Education Assessment Education Provided To: Patient Education Topics Provided Wound/Skin Impairment: Handouts: Caring for Your Ulcer Methods: Demonstration, Explain/Verbal Responses: State content correctly Electronic Signature(s) Signed: 11/24/2015 4:52:48 PM By: Gretta Cool, RN, BSN, Kim RN, BSN Entered By: Gretta Cool, RN, BSN, Kim on 11/24/2015 13:53:49 Sholtz, [name] ([id number]) -------------------------------------------------------------------------------- Wound Assessment Details Patient Name: [name] Date of Service: 11/24/2015 1:30 PM Medical Record Number: [id number] Patient Account Number: [id number] Date of Birth/Sex: 03/24/1940 (75 y.o. Male) Treating RN: Cornell Barman Primary Care Physician: Jani Gravel Other Clinician: Referring Physician: Jani Gravel Treating Physician/Extender: Loistine Chance in Treatment: 5 Wound Status Wound Number: 1 Primary Diabetic Wound/Ulcer of the Lower Etiology: Extremity Wound Location: Left Lower Leg - Anterior Wound Open Wounding Event: Gradually Appeared Status: Date Acquired: 09/21/2015 Comorbid Cataracts, Anemia, Hypertension, Weeks Of Treatment: 5 History: Type II Diabetes, Osteoarthritis Clustered Wound: No Photos Wound Measurements Length: (cm) 1.2 Width: (cm) 0.8 Depth: (cm) 0.2 Area: (cm) 0.754 Volume: (cm) 0.151 % Reduction in Area: 69.5% % Reduction in Volume: 38.9% Epithelialization: Small (1-33%) Tunneling:  No Undermining: No Wound Description Classification: Grade 1 Wound Margin: Distinct, outline attached Exudate Amount: Large Exudate Type: Serosanguineous Exudate Color: red, brown Foul Odor After Cleansing: No Wound Bed Granulation Amount: Medium (34-66%) Exposed Structure Granulation Quality: Red Fascia Exposed: No Necrotic Amount: Medium (34-66%) Fat Layer Exposed: No Necrotic Quality: Adherent Slough Tendon Exposed: No Muscle Exposed: No Joint Exposed: No Bone Exposed: No Patella, Johathan R. ([id number]) Limited to Skin Breakdown Periwound Skin Texture Texture Color No Abnormalities Noted: No No Abnormalities Noted: No Callus: No Atrophie Blanche: No Crepitus: No Cyanosis: No Excoriation: No Ecchymosis: Yes Fluctuance: No Erythema: No Friable: No Hemosiderin Staining: No Induration: Yes Mottled: No Localized Edema: No Pallor: No Rash: No Rubor: No Scarring: No Temperature / Pain Moisture Temperature: No Abnormality No Abnormalities Noted: No Tenderness on Palpation: Yes Dry / Scaly: No Maceration: No Moist: No Wound Preparation Ulcer Cleansing: Rinsed/Irrigated with Saline Topical Anesthetic Applied: Other: lidocaine 4%, Treatment Notes Wound #1 (Left, Anterior Lower Leg) 1. Cleansed with: Clean wound with Normal Saline 2. Anesthetic Topical Lidocaine 4% cream to wound bed prior to debridement 3. Peri-wound Care: Barrier cream 4. Dressing Applied: Hydrafera Blue 5. Secondary Garvin 7. Secured with Patient to wear own compression stockings Electronic Signature(s) Signed: 11/24/2015 4:52:48 PM By: Gretta Cool, RN, BSN, Kim RN, BSN Entered By: Gretta Cool, RN, BSN, Kim on 11/24/2015 13:37:46 Risdon, [name] ([id number]) -------------------------------------------------------------------------------- [place] Details Patient Name: [name] Date of Service: 11/24/2015 1:30 PM Medical Record Number: [id number] Patient Account  Number: [id number] Date of Birth/Sex: Mar 17, 1940 (75 y.o. Male) Treating RN: Cornell Barman Primary Care Physician: Jani Gravel Other Clinician: Referring Physician: Jani Gravel Treating Physician/Extender: Loistine Chance in  Treatment: 5 Vital Signs Time Taken: 13:31 Temperature (F): 98.0 Height (in): 70 Pulse (bpm): 67 Weight (lbs): 165.7 Respiratory Rate (breaths/min): 16 Body Mass Index (BMI): 23.8 Blood Pressure (mmHg): 150/48 Reference Range: 80 - 120 mg / dl Electronic Signature(s) Signed: 11/24/2015 4:52:48 PM By: Gretta Cool, RN, BSN, Kim RN, BSN Entered By: Gretta Cool, RN, BSN, Kim on 11/24/2015 13:32:07

## 2015-11-28 DIAGNOSIS — Z5181 Encounter for therapeutic drug level monitoring: Secondary | ICD-10-CM | POA: Diagnosis not present

## 2015-11-28 DIAGNOSIS — R238 Other skin changes: Secondary | ICD-10-CM | POA: Diagnosis not present

## 2015-11-28 DIAGNOSIS — D693 Immune thrombocytopenic purpura: Secondary | ICD-10-CM | POA: Diagnosis not present

## 2015-11-28 DIAGNOSIS — D649 Anemia, unspecified: Secondary | ICD-10-CM | POA: Diagnosis not present

## 2015-11-28 DIAGNOSIS — K7460 Unspecified cirrhosis of liver: Secondary | ICD-10-CM | POA: Diagnosis not present

## 2015-11-28 DIAGNOSIS — Z7984 Long term (current) use of oral hypoglycemic drugs: Secondary | ICD-10-CM | POA: Diagnosis not present

## 2015-11-28 DIAGNOSIS — Z79891 Long term (current) use of opiate analgesic: Secondary | ICD-10-CM | POA: Diagnosis not present

## 2015-11-28 DIAGNOSIS — E119 Type 2 diabetes mellitus without complications: Secondary | ICD-10-CM | POA: Diagnosis not present

## 2015-11-28 DIAGNOSIS — I10 Essential (primary) hypertension: Secondary | ICD-10-CM | POA: Diagnosis not present

## 2015-11-28 DIAGNOSIS — L03116 Cellulitis of left lower limb: Secondary | ICD-10-CM | POA: Diagnosis not present

## 2015-11-28 DIAGNOSIS — Z792 Long term (current) use of antibiotics: Secondary | ICD-10-CM | POA: Diagnosis not present

## 2015-11-28 DIAGNOSIS — Z7901 Long term (current) use of anticoagulants: Secondary | ICD-10-CM | POA: Diagnosis not present

## 2015-11-30 DIAGNOSIS — L03116 Cellulitis of left lower limb: Secondary | ICD-10-CM | POA: Diagnosis not present

## 2015-11-30 DIAGNOSIS — D649 Anemia, unspecified: Secondary | ICD-10-CM | POA: Diagnosis not present

## 2015-11-30 DIAGNOSIS — K7460 Unspecified cirrhosis of liver: Secondary | ICD-10-CM | POA: Diagnosis not present

## 2015-11-30 DIAGNOSIS — Z7984 Long term (current) use of oral hypoglycemic drugs: Secondary | ICD-10-CM | POA: Diagnosis not present

## 2015-11-30 DIAGNOSIS — E119 Type 2 diabetes mellitus without complications: Secondary | ICD-10-CM | POA: Diagnosis not present

## 2015-11-30 DIAGNOSIS — I10 Essential (primary) hypertension: Secondary | ICD-10-CM | POA: Diagnosis not present

## 2015-11-30 DIAGNOSIS — D693 Immune thrombocytopenic purpura: Secondary | ICD-10-CM | POA: Diagnosis not present

## 2015-11-30 DIAGNOSIS — Z79891 Long term (current) use of opiate analgesic: Secondary | ICD-10-CM | POA: Diagnosis not present

## 2015-11-30 DIAGNOSIS — Z5181 Encounter for therapeutic drug level monitoring: Secondary | ICD-10-CM | POA: Diagnosis not present

## 2015-11-30 DIAGNOSIS — R238 Other skin changes: Secondary | ICD-10-CM | POA: Diagnosis not present

## 2015-11-30 DIAGNOSIS — Z792 Long term (current) use of antibiotics: Secondary | ICD-10-CM | POA: Diagnosis not present

## 2015-11-30 DIAGNOSIS — Z7901 Long term (current) use of anticoagulants: Secondary | ICD-10-CM | POA: Diagnosis not present

## 2015-12-01 ENCOUNTER — Encounter: Payer: Medicare Other | Admitting: Nurse Practitioner

## 2015-12-01 DIAGNOSIS — I890 Lymphedema, not elsewhere classified: Secondary | ICD-10-CM | POA: Diagnosis not present

## 2015-12-01 DIAGNOSIS — Z7901 Long term (current) use of anticoagulants: Secondary | ICD-10-CM | POA: Diagnosis not present

## 2015-12-01 DIAGNOSIS — L97822 Non-pressure chronic ulcer of other part of left lower leg with fat layer exposed: Secondary | ICD-10-CM | POA: Diagnosis not present

## 2015-12-01 DIAGNOSIS — L97222 Non-pressure chronic ulcer of left calf with fat layer exposed: Secondary | ICD-10-CM | POA: Diagnosis not present

## 2015-12-01 DIAGNOSIS — E785 Hyperlipidemia, unspecified: Secondary | ICD-10-CM | POA: Diagnosis not present

## 2015-12-01 DIAGNOSIS — I10 Essential (primary) hypertension: Secondary | ICD-10-CM | POA: Diagnosis not present

## 2015-12-01 DIAGNOSIS — Z7984 Long term (current) use of oral hypoglycemic drugs: Secondary | ICD-10-CM | POA: Diagnosis not present

## 2015-12-01 DIAGNOSIS — Z79899 Other long term (current) drug therapy: Secondary | ICD-10-CM | POA: Diagnosis not present

## 2015-12-01 DIAGNOSIS — M1990 Unspecified osteoarthritis, unspecified site: Secondary | ICD-10-CM | POA: Diagnosis not present

## 2015-12-01 DIAGNOSIS — Z86718 Personal history of other venous thrombosis and embolism: Secondary | ICD-10-CM | POA: Diagnosis not present

## 2015-12-01 DIAGNOSIS — I6523 Occlusion and stenosis of bilateral carotid arteries: Secondary | ICD-10-CM | POA: Diagnosis not present

## 2015-12-01 DIAGNOSIS — D649 Anemia, unspecified: Secondary | ICD-10-CM | POA: Diagnosis not present

## 2015-12-01 DIAGNOSIS — E11622 Type 2 diabetes mellitus with other skin ulcer: Secondary | ICD-10-CM | POA: Diagnosis not present

## 2015-12-01 DIAGNOSIS — Z8673 Personal history of transient ischemic attack (TIA), and cerebral infarction without residual deficits: Secondary | ICD-10-CM | POA: Diagnosis not present

## 2015-12-02 NOTE — Progress Notes (Signed)
[name], [name] ([id number]) Visit Report for 12/01/2015 Chief Complaint Document Details Patient Name: [name], [name] Date of Service: 12/01/2015 1:30 PM Medical Record Number: [id number] Patient Account Number: [id number] Date of Birth/Sex: [birth year] (75 y.o. Male) Treating RN: Carole Civil Primary Care Physician: Jani Gravel Other Clinician: Referring Physician: Jani Gravel Treating Physician/Extender: Loistine Chance in Treatment: 6 Information Obtained from: Patient Chief Complaint Patients presents for treatment of an open diabetic ulcer left lower extremity with swelling, cellulitis and ulceration for about a month Electronic Signature(s) Signed: 12/01/2015 5:31:30 PM By: Londell Moh FNP Entered By: Londell Moh on 12/01/2015 14:13:14 Rimmer, [name] Kitchen ([id number]) -------------------------------------------------------------------------------- HPI Details Patient Name: [name] Date of Service: 12/01/2015 1:30 PM Medical Record Number: [id number] Patient Account Number: [id number] Date of Birth/Sex: [birth year] (75 y.o. Male) Treating RN: Carole Civil Primary Care Physician: Jani Gravel Other Clinician: Referring Physician: Jani Gravel Treating Physician/Extender: Loistine Chance in Treatment: 6 History of Present Illness Location: left lower extremity Quality: Patient reports experiencing a dull pain to affected area(s). Severity: Patient states wound are getting worse. Duration: Patient has had the wound for < 4 weeks prior to presenting for treatment Timing: Pain in wound is Intermittent (comes and goes Context: The wound appeared gradually over time Modifying Factors: Other treatment(s) tried include:hospital admission with IV antibiotics and workup Associated Signs and Symptoms: Patient reports having increase swelling. HPI Description: 75 year old gentleman who was been having his primary care taken care of by Dr. Maudie Mercury  at [facility] has had recurrent cellulitis the left lower extremity and so far has had 3 attacks. First started in August of this year and was admitted to the ER and sent home with Keflex and. He saw his primary care doctor on August 9 and was referred to [facility] where they began intravenous antibiotics and was admitted to August 15. He at present on doxycycline 100 mg twice a day. 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 Never been a smoker. During the admission he had a ultrasound done to look for a abscess and none was found except for subcutaneous edema and there was no focal muscle fluid collection. Xray of the left lower extremity --IMPRESSION:Soft tissue swelling and stranding. No subcutaneous gas or acute osseous abnormality identified about the left tib-fib. oOf note there was a question of Coumadin induced skin necrosis and has been on Coumadin since the late 90's. Last hemoglobin A1c was 6.7. 10/26/2015 -- arterial and venous duplex studies were done but report is still pending. 11/02/2015 -- a lower extremity venous duplex reflux evaluation -- there was no evidence of DVT or SVT in the left lower extremity. 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 No further Vascular cosultation was recommended for this. Arterial studies done showed he had noncompressible vessels bilaterally and hence ABI was not reliable. The right toe brachial index was 0.56 which was abnormal and the left was 0.81 which was normal.  Doppler signals were triphasic bilaterally both posterior tibial and dorsalis pedis. 11/24/15: f/u today. no new complaints or concerns. easily palpable peripheral pulses bilaterally. there is development of hypergranulated tissue within  the wound bed. denies fever, chills or body aches. NESTA, KIMPLE ([id number]) 12/01/15: returns to clinic today without new wounds or skin breakdown. he reports wound is looking better. denies fever, chills, body aches or malaise. Electronic Signature(s) Signed: 12/01/2015 5:31:30 PM By: Londell Moh FNP Entered By: Londell Moh on 12/01/2015 14:14:15 Adcock, [name] ([id number]) -------------------------------------------------------------------------------- Physical Exam Details Patient Name: [name] Date of Service: 12/01/2015 1:30 PM Medical Record Number: [id number] Patient Account Number: [id number] Date of Birth/Sex: Jul 01, 1940 (75 y.o. Male) Treating RN: Carole Civil Primary Care Physician: Jani Gravel Other Clinician: Referring Physician: Jani Gravel Treating Physician/Extender: Loistine Chance in Treatment: 6 Constitutional Patient's appearance is neat and clean. Appears in no acute distress. Well nourished and well developed.. Ears, Nose, Mouth, and Throat Patient can hear normal speaking tones without difficulty.. Cardiovascular Extremities are free of varicosities, clubbing or edema.[name] Psychiatric Judgement and insight intact.. Alert and oriented times 3.. No evidence of depression, anxiety, or agitation. Calm, cooperative, and communicative. Appropriate interactions and affect.. Electronic Signature(s) Signed: 12/01/2015 5:31:30 PM By: Londell Moh FNP Entered By: Londell Moh on 12/01/2015 14:14:59 [name], [name] ([id number]) -------------------------------------------------------------------------------- Physician Orders Details Patient Name: [name] Date of Service: 12/01/2015  1:30 PM Medical Record Number: [id number] Patient Account Number: [id number] Date of Birth/Sex: [birth year] (75 y.o. Male) Treating RN: Carole Civil Primary Care Physician: Jani Gravel Other Clinician: Referring Physician: Jani Gravel Treating Physician/Extender: Loistine Chance in Treatment: 6 Verbal / Phone Orders: No Diagnosis Coding Wound Cleansing Wound #1 Left,Anterior Lower Leg o Clean wound with wound cleanser. o Cleanse wound with mild soap and water o May Shower, gently pat wound dry prior to applying new dressing. Anesthetic Wound #1 Left,Anterior Lower Leg o Topical Lidocaine 4% cream applied to wound bed prior to debridement Skin Barriers/Peri-Wound Care Wound #1 Left,Anterior Lower Leg o Skin Prep Primary Wound Dressing Wound #1 Left,Anterior Lower Leg o Hydrafera Blue Secondary Dressing Wound #1 Left,Anterior Lower Leg o Boardered Foam Dressing - telfa island Dressing Change Frequency Wound #1 Left,Anterior Lower Leg o Three times weekly Follow-up Appointments Wound #1 Left,Anterior Lower Leg o Return Appointment in 1 week. Edema Control Wound #1 Left,Anterior Lower Leg o Elevate legs to the level of the heart and pump ankles as often as possible o Other: - wear compression stockings Zumwalt, Jani R. ([id number]) Additional Orders / Instructions Wound #1 Left,Anterior Lower Leg o Increase protein intake. Home Health Wound #1 [place] Visits - [facility] ([facility] Nurse may visit PRN to address patientos wound care needs. o FACE TO FACE ENCOUNTER: MEDICARE and MEDICAID PATIENTS: I certify that this patient is under my care and that I had a face-to-face encounter that meets the physician face-to-face encounter requirements with this patient on this date. The encounter with the patient was in whole or in part for the following MEDICAL CONDITION: (primary reason for  [place]) MEDICAL NECESSITY: I certify, that based on my findings, NURSING services are a medically necessary home health service. HOME BOUND STATUS: I certify that my clinical findings support that this patient is homebound (i.e., Due to illness or injury, pt requires aid of supportive devices such as crutches, cane, wheelchairs, walkers, the use of special transportation or the assistance of another person to leave their place of residence. There is a normal inability to leave the home and doing so requires considerable and taxing effort. Other absences are for medical reasons / religious services and  are infrequent or of short duration when for other reasons). o If current dressing causes regression in wound condition, may D/C ordered dressing product/s and apply Normal Saline Moist Dressing daily until next [place] / Other MD appointment. [place] of regression in wound condition at [phone]. o Please direct any NON-WOUND related issues/requests for orders to patient's Primary Care Physician Medications-please add to medication list. Wound #1 Left,Anterior Lower Leg o Other: - Vitamin A, Vitamin C, Zinc Electronic Signature(s) Signed: 12/01/2015 5:31:30 PM By: Londell Moh FNP Signed: 12/01/2015 5:52:12 PM By: Carole Civil Entered By: Carole Civil on 12/01/2015 14:08:05 [name], [name] Kitchen ([id number]) -------------------------------------------------------------------------------- Problem List Details Patient Name: [name] Date of Service: 12/01/2015 1:30 PM Medical Record Number: [id number] Patient Account Number: [id number] Date of Birth/Sex: September 03, 1940 (75 y.o. 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 Male) Treating RN: Carole Civil Primary Care Physician: Jani Gravel Other Clinician: Referring Physician: Jani Gravel Treating Physician/Extender: Loistine Chance in Treatment: 6 Subjective Chief Complaint Information obtained from Patient Patients presents for treatment of an open diabetic ulcer left lower extremity with swelling, cellulitis and ulceration for about a month History of Present Illness (HPI) The following HPI elements were documented for the patient's wound: Location: left lower extremity Quality: Patient reports experiencing a dull pain to affected area(s). Severity: Patient states wound are getting worse. Duration: Patient has had the wound for < 4 weeks prior to presenting for treatment Timing: Pain in wound is Intermittent (comes and goes Context: The wound appeared gradually over time Modifying Factors: Other treatment(s) tried include:hospital admission with IV antibiotics and workup Associated Signs and Symptoms: Patient reports having increase swelling. 75 year old gentleman who was been having his primary care taken care of by Dr. Maudie Mercury at [facility] has had recurrent cellulitis the left lower extremity and so far has had 3 attacks.  First started in August of this year and was admitted to the ER and sent home with Keflex and. He saw his primary care doctor on August 9 and was referred to [facility] where they began intravenous antibiotics and was admitted to August 15. He at present on doxycycline 100 mg twice a day. 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 Never been a smoker. During the admission he had a ultrasound done to look for a abscess and none was found except for subcutaneous edema and there was no focal muscle fluid collection. Xray of the left lower extremity --IMPRESSION:Soft tissue  swelling and stranding. No subcutaneous gas or acute osseous abnormality identified about the left tib-fib. Of note there was a question of Coumadin induced skin necrosis and has been on Coumadin since the late 90's. Last hemoglobin A1c was 6.7. 10/26/2015 -- arterial and venous duplex studies were done but report is still pending. 11/02/2015 -- a lower extremity venous duplex reflux evaluation -- there was no evidence of DVT or SVT in [name], [name] ([id number]) the left lower extremity. 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 No further Vascular cosultation was recommended for this. Arterial studies done showed he had noncompressible vessels bilaterally and hence ABI was not reliable. The right toe brachial index was 0.56 which was abnormal and the left was 0.81 which was normal. Doppler signals were triphasic bilaterally both posterior tibial and dorsalis  pedis. 11/24/15: f/u today. no new complaints or concerns. easily palpable peripheral pulses bilaterally. there is development of hypergranulated tissue within the wound bed. denies fever, chills or body aches. 12/01/15: returns to clinic today without new wounds or skin breakdown. he reports wound is looking better. denies fever, chills, body aches or malaise. Objective Constitutional Patient's appearance is neat and clean. Appears in no acute distress. Well nourished and well developed.. Vitals Time Taken: 1:44 PM, Height: 70 in, Weight: 165.7 lbs, BMI: 23.8, Temperature: 98 F, Pulse: 66 bpm, Respiratory Rate: 16 breaths/min, Blood Pressure: 130/58 mmHg, Capillary Blood Glucose: 102 mg/dl. Ears, Nose, Mouth, and Throat Patient can hear normal speaking tones without difficulty.. Cardiovascular Extremities are free of varicosities, clubbing or edema.[name] Psychiatric Judgement and insight intact.. Alert and oriented times 3.. No evidence of depression, anxiety, or agitation. Calm, cooperative, and communicative. Appropriate interactions and affect.. Integumentary (Hair, Skin) Wound #1 status is Open. Original cause of wound was Gradually Appeared. The wound is located on the Left,Anterior Lower Leg. The wound measures 1.1cm length x 0.8cm width x 0.1cm depth; 0.691cm^2 area and 0.069cm^3 volume. The wound is limited to skin breakdown. There is no tunneling or undermining noted. There is a small amount of serosanguineous drainage noted. The wound margin is distinct with the outline attached to the wound base. There is large (67-100%) red granulation within the wound bed. There is no necrotic tissue within the wound bed. The periwound skin appearance did not exhibit: Callus, Crepitus, Excoriation, Fluctuance, Friable, Induration, Localized Edema, Rash, Scarring, Dry/Scaly, Maceration, Moist, Atrophie Blanche, Cyanosis, Ecchymosis, Hemosiderin Staining, Mottled, Pallor, Rubor,  Erythema. Periwound temperature was noted as No Abnormality. The periwound has tenderness on palpation. [name], [name] ([id number]) the left lower leg wound shows improvement. 100% healthy red granulation tissue noted across the wound bed. no s/s of infection. significant improvement regarding wound appearance since initation of hydrofera blue last week. debridment not required today. Assessment Active Problems ICD-10 E11.622 - Type 2 diabetes mellitus with other skin ulcer L97.222 - Non-pressure chronic ulcer of left calf with fat layer exposed I89.0 - Lymphedema, not elsewhere classified Plan Wound Cleansing: Wound #1 Left,Anterior Lower Leg: Clean wound with wound cleanser. Cleanse wound with mild soap and water May Shower, gently pat wound dry prior to applying new dressing. Anesthetic: Wound #1 Left,Anterior Lower Leg: Topical Lidocaine 4% cream applied to wound bed prior to debridement Skin Barriers/Peri-Wound Care: Wound #1 Left,Anterior Lower Leg: Skin Prep Primary Wound Dressing: Wound #1 Left,Anterior Lower Leg: Hydrafera Blue Secondary Dressing: Wound #1 Left,Anterior Lower Leg: Boardered Foam Dressing - telfa island Dressing Change Frequency: Wound #1 Left,Anterior Lower Leg: Three times weekly Follow-up Appointments: Wound #  1 Left,Anterior Lower Leg: Return Appointment in 1 week. Edema Control: Wound #1 Left,Anterior Lower Leg: Elevate legs to the level of the heart and pump ankles as often as possible Other: - wear compression stockings [name], [name] ([id number]) Additional Orders / Instructions: Wound #1 Left,Anterior Lower Leg: Increase protein intake. Home Health: Wound #1 Left,Anterior Lower Leg: Continue Home Health Visits - [facility] ([facility] [place] Nurse may visit PRN to address patient s wound care needs. FACE TO FACE ENCOUNTER: MEDICARE and MEDICAID PATIENTS: I certify that this patient is under my care and that I had a  face-to-face encounter that meets the physician face-to-face encounter requirements with this patient on this date. The encounter with the patient was in whole or in part for the following MEDICAL CONDITION: (primary reason for [name]) MEDICAL NECESSITY: I certify, that based on my findings, NURSING services are a medically necessary home health service. HOME BOUND STATUS: I certify that my clinical findings support that this patient is homebound (i.e., Due to illness or injury, pt requires aid of supportive devices such as crutches, cane, wheelchairs, walkers, the use of special transportation or the assistance of another person to leave their place of residence. There is a normal inability to leave the home and doing so requires considerable and taxing effort. Other absences are for medical reasons / religious services and are infrequent or of short duration when for other reasons). If current dressing causes regression in wound condition, may D/C ordered dressing product/s and apply Normal Saline Moist Dressing daily until next [place] / Other MD appointment. [place] of regression in wound condition at [phone]. Please direct any NON-WOUND related issues/requests for orders to patient's Primary Care Physician Medications-please add to medication list.: Wound #1 Left,Anterior Lower Leg: Other: - Vitamin A, Vitamin C, Zinc 1. discussed clinical findings and implications with pt and wife. all questions were answered. Electronic Signature(s) Signed: 12/01/2015 5:31:30 PM By: Londell Moh FNP Entered By: Londell Moh on 12/01/2015 14:16:41 [place], [place] ParkMarland Kitchen ([id number]) -------------------------------------------------------------------------------- [place] Details Patient Name: [name] Date of Service: 12/01/2015 Medical Record Number: [id number] Patient Account Number: [id number] Date of Birth/Sex: Mar 07, 1940 (75 y.o.  Male) Treating RN: Carole Civil Primary Care Physician: Jani Gravel Other Clinician: Referring Physician: Jani Gravel Treating Physician/Extender: Loistine Chance in Treatment: 6 Diagnosis Coding ICD-10 Codes Code Description E11.622 Type 2 diabetes mellitus with other skin ulcer L97.222 Non-pressure chronic ulcer of left calf with fat layer exposed I89.0 Lymphedema, not elsewhere classified Facility Procedures CPT4 Code: 29528413 Description: 99213 - WOUND CARE VISIT-LEV 3 EST PT Modifier: Quantity: 1 Physician Procedures CPT4 Code: 2440102 Description: 72536 - WC PHYS LEVEL 2 - EST PT ICD-10 Description Diagnosis E11.622 Type 2 diabetes mellitus with other skin ulcer L97.222 Non-pressure chronic ulcer of left calf with fat Modifier: layer exposed Quantity: 1 Electronic Signature(s) Signed: 12/01/2015 5:31:30 PM By: Londell Moh FNP Entered By: Londell Moh on 12/01/2015 14:16:56

## 2015-12-02 NOTE — Progress Notes (Signed)
[name] ([id number]) Visit Report for 12/01/2015 Arrival Information Details Patient Name: [name], [name] Date of Service: 12/01/2015 1:30 PM Medical Record Number: [id number] Patient Account Number: [id number] Date of Birth/Sex: 1940-04-07 (75 y.o. Male) Treating RN: Carole Civil Primary Care Physician: Jani Gravel Other Clinician: Referring Physician: Jani Gravel Treating Physician/Extender: Loistine Chance in Treatment: 6 Visit Information History Since Last Visit All ordered tests and consults were No Patient Arrived: Ambulatory completed: Arrival Time: 13:37 Added or deleted any medications: No Accompanied By: wife Any new allergies or adverse reactions: No Transfer Assistance: None Had a fall or experienced change in No Patient Identification Verified: Yes activities of daily living that may affect Secondary Verification Yes risk of falls: Process Completed: Signs or symptoms of abuse/neglect since last No Patient Requires No visito Transmission-Based Hospitalized since last visit: No Precautions: Has Dressing in Place as Prescribed: Yes Patient Has Alerts: Yes Pain Present Now: No Patient Alerts: Patient on Blood Thinner Warfarin DM II ABIs noncompressible Electronic Signature(s) Signed: 12/01/2015 5:52:12 PM By: Carole Civil Entered By: Carole Civil on 12/01/2015 13:43:50 Tung, Abdulkareem RMarland Kitchen ([id number]) -------------------------------------------------------------------------------- Clinic Level of Care Assessment Details Patient Name: [name] Date of Service: 12/01/2015 1:30 PM Medical Record Number: [id number] Patient Account Number: [id number] Date of Birth/Sex: 07/10/1940 (75 y.o. Male) Treating RN: Carole Civil Primary Care Physician: Jani Gravel Other Clinician: Referring Physician: Jani Gravel Treating Physician/Extender: Loistine Chance in Treatment: 6 Clinic Level of Care Assessment Items TOOL 4 Quantity  Score X - Use when only an EandM is performed on FOLLOW-UP visit 1 0 ASSESSMENTS - Nursing Assessment / Reassessment X - Reassessment of Co-morbidities (includes updates in patient status) 1 10 X - Reassessment of Adherence to Treatment Plan 1 5 ASSESSMENTS - Wound and Skin Assessment / Reassessment X - Simple Wound Assessment / Reassessment - one wound 1 5 []  - Complex Wound Assessment / Reassessment - multiple wounds 0 []  - Dermatologic / Skin Assessment (not related to wound area) 0 ASSESSMENTS - Focused Assessment []  - Circumferential Edema Measurements - multi extremities 0 []  - Nutritional Assessment / Counseling / Intervention 0 X - Lower Extremity Assessment (monofilament, tuning fork, pulses) 1 5 []  - Peripheral Arterial Disease Assessment (using hand held doppler) 0 ASSESSMENTS - Ostomy and/or Continence Assessment and Care []  - Incontinence Assessment and Management 0 []  - Ostomy Care Assessment and Management (repouching, etc.) 0 PROCESS - Coordination of Care X - Simple Patient / Family Education for ongoing care 1 15 []  - Complex (extensive) Patient / Family Education for ongoing care 0 X - Staff obtains Programmer, systems, Records, Test Results / Process Orders 1 10 X - Staff telephones HHA, Nursing Homes / Clarify orders / etc 1 10 []  - Routine Transfer to another Facility (non-emergent condition) 0 Faughnan, Riki R. ([id number]) []  - Routine Hospital Admission (non-emergent condition) 0 []  - New Admissions / Biomedical engineer / Ordering NPWT, Apligraf, etc. 0 []  - Emergency Hospital Admission (emergent condition) 0 X - Simple Discharge Coordination 1 10 []  - Complex (extensive) Discharge Coordination 0 PROCESS - Special Needs []  - Pediatric / Minor Patient Management 0 []  - Isolation Patient Management 0 []  - Hearing / Language / Visual special needs 0 []  - Assessment of Community assistance (transportation, D/C planning, etc.) 0 []  - Additional assistance / Altered  mentation 0 []  - Support Surface(s) Assessment (bed, cushion, seat, etc.) 0 INTERVENTIONS - Wound Cleansing / Measurement X - Simple Wound Cleansing - one wound 1 5 []  -  Complex Wound Cleansing - multiple wounds 0 []  - Wound Imaging (photographs - any number of wounds) 0 X - Wound Tracing (instead of photographs) 1 5 X - Simple Wound Measurement - one wound 1 5 []  - Complex Wound Measurement - multiple wounds 0 INTERVENTIONS - Wound Dressings X - Small Wound Dressing one or multiple wounds 1 10 []  - Medium Wound Dressing one or multiple wounds 0 []  - Large Wound Dressing one or multiple wounds 0 []  - Application of Medications - topical 0 []  - Application of Medications - injection 0 INTERVENTIONS - Miscellaneous []  - External ear exam 0 Bamberg, Treyven R. ([id number]) []  - Specimen Collection (cultures, biopsies, blood, body fluids, etc.) 0 []  - Specimen(s) / Culture(s) sent or taken to Lab for analysis 0 []  - Patient Transfer (multiple staff / [name] / Similar devices) 0 []  - Simple Staple / Suture removal (25 or less) 0 []  - Complex Staple / Suture removal (26 or more) 0 []  - Hypo / Hyperglycemic Management (close monitor of Blood Glucose) 0 []  - Ankle / Brachial Index (ABI) - do not check if billed separately 0 X - Vital Signs 1 5 Has the patient been seen at the hospital within the last three years: Yes Total Score: 100 Level Of Care: New/Established - Level 3 Electronic Signature(s) Signed: 12/01/2015 5:52:12 PM By: Carole Civil Entered By: Carole Civil on 12/01/2015 14:10:11 Axley, Kekai RMarland Kitchen ([id number]) -------------------------------------------------------------------------------- Encounter Discharge Information Details Patient Name: [name] Date of Service: 12/01/2015 1:30 PM Medical Record Number: [id number] Patient Account Number: [id number] Date of Birth/Sex: Feb 03, 1941 (75 y.o. Male) Treating RN: Carole Civil Primary Care Physician: Jani Gravel Other Clinician: Referring Physician: Jani Gravel Treating Physician/Extender: Loistine Chance in Treatment: 6 Encounter Discharge Information Items Discharge Pain Level: 0 Discharge Condition: Stable Ambulatory Status: Ambulatory Discharge Destination: Home Transportation: Private Auto Accompanied By: wife Schedule Follow-up Appointment: Yes Medication Reconciliation completed and provided to Patient/Care Yes Dreux Mcgroarty: Provided on Clinical Summary of Care: 12/01/2015 Form Type Recipient Paper Patient BP Electronic Signature(s) Signed: 12/01/2015 2:21:26 PM By: Ruthine Dose Entered By: Ruthine Dose on 12/01/2015 14:21:26 Griffitts, Eulon RMarland Kitchen ([id number]) -------------------------------------------------------------------------------- Lower Extremity Assessment Details Patient Name: [name] Date of Service: 12/01/2015 1:30 PM Medical Record Number: [id number] Patient Account Number: [id number] Date of Birth/Sex: 23-Jan-1941 (75 y.o. Male) Treating RN: Carole Civil Primary Care Physician: Jani Gravel Other Clinician: Referring Physician: Jani Gravel Treating Physician/Extender: Loistine Chance in Treatment: 6 Edema Assessment Assessed: Shirlyn Goltz: No] [Right: No] E[Left: dema] [Right: :] Calf Left: Right: Point of Measurement: cm From Medial Instep 20 cm cm Ankle Left: Right: Point of Measurement: cm From Medial Instep 32.8 cm cm Vascular Assessment Pulses: Posterior Tibial Dorsalis Pedis Palpable: [Left:Yes] Extremity colors, hair growth, and conditions: Extremity Color: [Left:Normal] Temperature of Extremity: [Left:Warm] Capillary Refill: [Left:< 3 seconds] Dependent Rubor: [Left:No] Lipodermatosclerosis: [Left:No] Toe Nail Assessment Left: Right: Thick: No Discolored: No Deformed: No Improper Length and Hygiene: No Electronic Signature(s) Signed: 12/01/2015 5:52:12 PM By: Carole Civil Entered By: Carole Civil on 12/01/2015  13:53:10 Runkel, [place] R. ([id number]) ZHION, PEVEHOUSE ([id number]) -------------------------------------------------------------------------------- [place] Details Patient Name: VISHRUTH, SEOANE Date of Service: 12/01/2015 1:30 PM Medical Record Number: [id number] Patient Account Number: [id number] Date of Birth/Sex: 24-Dec-1940 (75 y.o. Male) Treating RN: Carole Civil Primary Care Physician: Jani Gravel Other Clinician: Referring Physician: Jani Gravel Treating Physician/Extender: Loistine Chance in Treatment: 6 Active Inactive Abuse / Safety / Falls / Self Care Management Nursing  Diagnoses: Potential for falls Goals: Patient will remain injury free Date Initiated: 10/16/2015 Goal Status: Active Interventions: Assess fall risk on admission and as needed Notes: Nutrition Nursing Diagnoses: Imbalanced nutrition Impaired glucose control: actual or potential Goals: Patient/caregiver agrees to and verbalizes understanding of need to use nutritional supplements and/or vitamins as prescribed Date Initiated: 10/16/2015 Goal Status: Active Patient/caregiver verbalizes understanding of need to maintain therapeutic glucose control per primary care physician Date Initiated: 10/16/2015 Goal Status: Active Interventions: Assess patient nutrition upon admission and as needed per policy Provide education on elevated blood sugars and impact on wound healing Notes: Miler, Ray R. ([id number]) Orientation to the Wound Care Program Nursing Diagnoses: Knowledge deficit related to the wound healing center program Goals: Patient/caregiver will verbalize understanding of the [place] Program Date Initiated: 10/16/2015 Goal Status: Active Interventions: Provide education on orientation to the wound center Notes: Pain, Acute or Chronic Nursing Diagnoses: Pain, acute or chronic: actual or potential Potential alteration in comfort,  pain Goals: Patient will verbalize adequate pain control and receive pain control interventions during procedures as needed Date Initiated: 10/16/2015 Goal Status: Active Interventions: Assess comfort goal upon admission Complete pain assessment as per visit requirements Notes: Wound/Skin Impairment Nursing Diagnoses: Impaired tissue integrity Goals: Ulcer/skin breakdown will have a volume reduction of 30% by week 4 Date Initiated: 10/16/2015 Goal Status: Active Ulcer/skin breakdown will have a volume reduction of 50% by week 8 Date Initiated: 10/16/2015 Goal Status: Active Ulcer/skin breakdown will have a volume reduction of 80% by week 12 Date Initiated: 10/16/2015 OMERO, KOWAL ([id number]) Goal Status: Active Interventions: Assess patient/caregiver ability to perform ulcer/skin care regimen upon admission and as needed Assess ulceration(s) every visit Notes: Electronic Signature(s) Signed: 12/01/2015 5:52:12 PM By: Carole Civil Entered By: Carole Civil on 12/01/2015 14:07:20 Scalzo, Sesar R. ([id number]) -------------------------------------------------------------------------------- Pain Assessment Details Patient Name: [name] Date of Service: 12/01/2015 1:30 PM Medical Record Number: [id number] Patient Account Number: [id number] Date of Birth/Sex: 05/06/1940 (75 y.o. Male) Treating RN: Carole Civil Primary Care Physician: Jani Gravel Other Clinician: Referring Physician: Jani Gravel Treating Physician/Extender: Loistine Chance in Treatment: 6 Active Problems Location of Pain Severity and Description of Pain Patient Has Paino No Site Locations Pain Management and Medication Current Pain Management: Electronic Signature(s) Signed: 12/01/2015 5:52:12 PM By: Carole Civil Entered By: Carole Civil on 12/01/2015 13:59:40 Lyall, Zaydin RMarland Kitchen  ([id number]) -------------------------------------------------------------------------------- Patient/Caregiver Education Details Patient Name: [name] Date of Service: 12/01/2015 1:30 PM Medical Record Number: [id number] Patient Account Number: [id number] Date of Birth/Gender: May 29, 1940 (75 y.o. Male) Treating RN: Carole Civil Primary Care Physician: Jani Gravel Other Clinician: Referring Physician: Jani Gravel Treating Physician/Extender: Loistine Chance in Treatment: 6 Education Assessment Education Provided To: Patient and Caregiver Education Topics Provided Wound/Skin Impairment: Methods: Explain/Verbal Responses: State content correctly Electronic Signature(s) Signed: 12/01/2015 5:52:12 PM By: Carole Civil Entered By: Carole Civil on 12/01/2015 14:20:49 Coles, Javione RMarland Kitchen ([id number]) -------------------------------------------------------------------------------- Wound Assessment Details Patient Name: [name] Date of Service: 12/01/2015 1:30 PM Medical Record Number: [id number] Patient Account Number: [id number] Date of Birth/Sex: [birth year] (75 y.o. Male) Treating RN: Carole Civil Primary Care Physician: Jani Gravel Other Clinician: Referring Physician: Jani Gravel Treating Physician/Extender: Loistine Chance in Treatment: 6 Wound Status Wound Number: 1 Primary Diabetic Wound/Ulcer of the Lower Etiology: Extremity Wound Location: Left Lower Leg - Anterior Wound Open Wounding Event: Gradually Appeared Status: Date Acquired: 09/21/2015 Comorbid Cataracts, Anemia, Hypertension, Weeks Of Treatment: 6 History: Type II Diabetes, Osteoarthritis Clustered Wound: No Wound  Measurements Length: (cm) 1.1 Width: (cm) 0.8 Depth: (cm) 0.1 Area: (cm) 0.691 Volume: (cm) 0.069 % Reduction in Area: 72.1% % Reduction in Volume: 72.1% Epithelialization: Small (1-33%) Tunneling: No Undermining: No Wound Description Classification: Grade  1 Wound Margin: Distinct, outline attached Exudate Amount: Small Exudate Type: Serosanguineous Exudate Color: red, brown Foul Odor After Cleansing: No Wound Bed Granulation Amount: Large (67-100%) Exposed Structure Granulation Quality: Red Fascia Exposed: No Necrotic Amount: None Present (0%) Fat Layer Exposed: No Tendon Exposed: No Muscle Exposed: No Joint Exposed: No Bone Exposed: No Limited to Skin Breakdown Periwound Skin Texture Texture Color No Abnormalities Noted: No No Abnormalities Noted: No Callus: No Atrophie Blanche: No Crepitus: No Cyanosis: No Excoriation: No Ecchymosis: No Fluctuance: No Erythema: No Liby, Kimarion R. ([id number]) Friable: No Hemosiderin Staining: No Induration: No Mottled: No Localized Edema: No Pallor: No Rash: No Rubor: No Scarring: No Temperature / Pain Moisture Temperature: No Abnormality No Abnormalities Noted: No Tenderness on Palpation: Yes Dry / Scaly: No Maceration: No Moist: No Wound Preparation Ulcer Cleansing: Rinsed/Irrigated with Saline Topical Anesthetic Applied: Other: lidocaine 4%, Treatment Notes Wound #1 (Left, Anterior Lower Leg) 1. Cleansed with: Clean wound with Normal Saline 2. Anesthetic Liquid 4% Topical Lidocaine Solution 3. Peri-wound Care: Skin Prep 4. Dressing Applied: Hydrafera Blue 5. Secondary Dressing Applied Bordered Foam Dressing 7. Secured with Patient to wear own compression stockings Electronic Signature(s) Signed: 12/01/2015 5:52:12 PM By: Carole Civil Entered By: Carole Civil on 12/01/2015 [place], [place] Kitchen ([id number]) -------------------------------------------------------------------------------- Vitals Details Patient Name: [name] Date of Service: 12/01/2015 1:30 PM Medical Record Number: [id number] Patient Account Number: [id number] Date of Birth/Sex: [birth year] (75 y.o. Male) Treating RN: Carole Civil Primary Care Physician: Jani Gravel Other  Clinician: Referring Physician: Jani Gravel Treating Physician/Extender: Loistine Chance in Treatment: 6 Vital Signs Time Taken: 13:44 Temperature (F): 98 Height (in): 70 Pulse (bpm): 66 Weight (lbs): 165.7 Respiratory Rate (breaths/min): 16 Body Mass Index (BMI): 23.8 Blood Pressure (mmHg): 130/58 Capillary Blood Glucose (mg/dl): 102 Reference Range: 80 - 120 mg / dl Electronic Signature(s) Signed: 12/01/2015 5:52:12 PM By: Carole Civil Entered By: Carole Civil on 12/01/2015 13:47:39

## 2015-12-04 DIAGNOSIS — Z4800 Encounter for change or removal of nonsurgical wound dressing: Secondary | ICD-10-CM | POA: Diagnosis not present

## 2015-12-04 DIAGNOSIS — D693 Immune thrombocytopenic purpura: Secondary | ICD-10-CM | POA: Diagnosis not present

## 2015-12-04 DIAGNOSIS — L97222 Non-pressure chronic ulcer of left calf with fat layer exposed: Secondary | ICD-10-CM | POA: Diagnosis not present

## 2015-12-04 DIAGNOSIS — D649 Anemia, unspecified: Secondary | ICD-10-CM | POA: Diagnosis not present

## 2015-12-04 DIAGNOSIS — Z7901 Long term (current) use of anticoagulants: Secondary | ICD-10-CM | POA: Diagnosis not present

## 2015-12-04 DIAGNOSIS — Z79891 Long term (current) use of opiate analgesic: Secondary | ICD-10-CM | POA: Diagnosis not present

## 2015-12-04 DIAGNOSIS — I10 Essential (primary) hypertension: Secondary | ICD-10-CM | POA: Diagnosis not present

## 2015-12-04 DIAGNOSIS — E11622 Type 2 diabetes mellitus with other skin ulcer: Secondary | ICD-10-CM | POA: Diagnosis not present

## 2015-12-04 DIAGNOSIS — K7460 Unspecified cirrhosis of liver: Secondary | ICD-10-CM | POA: Diagnosis not present

## 2015-12-04 DIAGNOSIS — Z7984 Long term (current) use of oral hypoglycemic drugs: Secondary | ICD-10-CM | POA: Diagnosis not present

## 2015-12-05 DIAGNOSIS — Z7901 Long term (current) use of anticoagulants: Secondary | ICD-10-CM | POA: Diagnosis not present

## 2015-12-05 DIAGNOSIS — Z86718 Personal history of other venous thrombosis and embolism: Secondary | ICD-10-CM | POA: Diagnosis not present

## 2015-12-06 DIAGNOSIS — Z7984 Long term (current) use of oral hypoglycemic drugs: Secondary | ICD-10-CM | POA: Diagnosis not present

## 2015-12-06 DIAGNOSIS — D649 Anemia, unspecified: Secondary | ICD-10-CM | POA: Diagnosis not present

## 2015-12-06 DIAGNOSIS — D693 Immune thrombocytopenic purpura: Secondary | ICD-10-CM | POA: Diagnosis not present

## 2015-12-06 DIAGNOSIS — Z7901 Long term (current) use of anticoagulants: Secondary | ICD-10-CM | POA: Diagnosis not present

## 2015-12-06 DIAGNOSIS — I10 Essential (primary) hypertension: Secondary | ICD-10-CM | POA: Diagnosis not present

## 2015-12-06 DIAGNOSIS — K7460 Unspecified cirrhosis of liver: Secondary | ICD-10-CM | POA: Diagnosis not present

## 2015-12-06 DIAGNOSIS — L97222 Non-pressure chronic ulcer of left calf with fat layer exposed: Secondary | ICD-10-CM | POA: Diagnosis not present

## 2015-12-06 DIAGNOSIS — Z79891 Long term (current) use of opiate analgesic: Secondary | ICD-10-CM | POA: Diagnosis not present

## 2015-12-06 DIAGNOSIS — E11622 Type 2 diabetes mellitus with other skin ulcer: Secondary | ICD-10-CM | POA: Diagnosis not present

## 2015-12-06 DIAGNOSIS — Z4800 Encounter for change or removal of nonsurgical wound dressing: Secondary | ICD-10-CM | POA: Diagnosis not present

## 2015-12-08 ENCOUNTER — Encounter: Payer: Medicare Other | Admitting: Surgery

## 2015-12-08 DIAGNOSIS — Z7984 Long term (current) use of oral hypoglycemic drugs: Secondary | ICD-10-CM | POA: Diagnosis not present

## 2015-12-08 DIAGNOSIS — M1990 Unspecified osteoarthritis, unspecified site: Secondary | ICD-10-CM | POA: Diagnosis not present

## 2015-12-08 DIAGNOSIS — Z7901 Long term (current) use of anticoagulants: Secondary | ICD-10-CM | POA: Diagnosis not present

## 2015-12-08 DIAGNOSIS — L97222 Non-pressure chronic ulcer of left calf with fat layer exposed: Secondary | ICD-10-CM | POA: Diagnosis not present

## 2015-12-08 DIAGNOSIS — I890 Lymphedema, not elsewhere classified: Secondary | ICD-10-CM | POA: Diagnosis not present

## 2015-12-08 DIAGNOSIS — I6523 Occlusion and stenosis of bilateral carotid arteries: Secondary | ICD-10-CM | POA: Diagnosis not present

## 2015-12-08 DIAGNOSIS — Z79899 Other long term (current) drug therapy: Secondary | ICD-10-CM | POA: Diagnosis not present

## 2015-12-08 DIAGNOSIS — E785 Hyperlipidemia, unspecified: Secondary | ICD-10-CM | POA: Diagnosis not present

## 2015-12-08 DIAGNOSIS — D649 Anemia, unspecified: Secondary | ICD-10-CM | POA: Diagnosis not present

## 2015-12-08 DIAGNOSIS — Z8673 Personal history of transient ischemic attack (TIA), and cerebral infarction without residual deficits: Secondary | ICD-10-CM | POA: Diagnosis not present

## 2015-12-08 DIAGNOSIS — Z86718 Personal history of other venous thrombosis and embolism: Secondary | ICD-10-CM | POA: Diagnosis not present

## 2015-12-08 DIAGNOSIS — E11622 Type 2 diabetes mellitus with other skin ulcer: Secondary | ICD-10-CM | POA: Diagnosis not present

## 2015-12-08 DIAGNOSIS — I10 Essential (primary) hypertension: Secondary | ICD-10-CM | POA: Diagnosis not present

## 2015-12-09 NOTE — Progress Notes (Signed)
[name] ([id number]) Visit Report for 12/08/2015 Arrival Information Details Patient Name: Joshua, [name] Date of Service: 12/08/2015 2:15 PM Medical Record Number: [id number] Patient Account Number: [id number] Date of Birth/Sex: May 18, 1940 (75 y.o. Male) Treating RN: [name] Primary Care Physician: [name] Other Clinician: Referring Physician: Jani [name] Treating Physician/Extender: [name] in Treatment: 7 Visit Information History Since Last Visit All ordered tests and consults were completed: No Patient Arrived: Ambulatory Added or deleted any medications: No Arrival Time: 14:22 Any new allergies or adverse reactions: No Accompanied By: wife Had a fall or experienced change in No Transfer Assistance: None activities of daily living that may affect Patient Identification Verified: Yes risk of falls: Secondary Verification Process Yes Signs or symptoms of abuse/neglect since last No Completed: visito Patient Requires No Hospitalized since last visit: No Transmission-Based Pain Present Now: No Precautions: Patient Has Alerts: Yes Patient Alerts: Patient on Blood Thinner Warfarin DM II ABIs noncompressible Electronic Signature(s) Signed: 12/08/2015 4:59:27 PM By: [name] Entered By: [name] on 12/08/2015 14:22:37 [name] Kitchen ([id number]) -------------------------------------------------------------------------------- Clinic Level of Care Assessment Details Patient Name: [name] Date of Service: 12/08/2015 2:15 PM Medical Record Number: [id number] Patient Account Number: [id number] Date of Birth/Sex: [birth year] (75 y.o. Male) Treating RN: [name] Primary Care Physician: [name] Other Clinician: Referring Physician: Jani [name] Treating Physician/Extender: [name] in Treatment: 7 Clinic Level of Care Assessment Items TOOL 4 Quantity Score []  - Use when only an EandM is  performed on FOLLOW-UP visit 0 ASSESSMENTS - Nursing Assessment / Reassessment X - Reassessment of Co-morbidities (includes updates in patient status) 1 10 X - Reassessment of Adherence to Treatment Plan 1 5 ASSESSMENTS - Wound and Skin Assessment / Reassessment X - Simple Wound Assessment / Reassessment - one wound 1 5 []  - Complex Wound Assessment / Reassessment - multiple wounds 0 []  - Dermatologic / Skin Assessment (not related to wound area) 0 ASSESSMENTS - Focused Assessment []  - Circumferential Edema Measurements - multi extremities 0 []  - Nutritional Assessment / Counseling / Intervention 0 X - Lower Extremity Assessment (monofilament, tuning fork, pulses) 1 5 []  - Peripheral Arterial Disease Assessment (using hand held doppler) 0 ASSESSMENTS - Ostomy and/or Continence Assessment and Care []  - Incontinence Assessment and Management 0 []  - Ostomy Care Assessment and Management (repouching, etc.) 0 PROCESS - Coordination of Care X - Simple Patient / Family Education for ongoing care 1 15 []  - Complex (extensive) Patient / Family Education for ongoing care 0 []  - Staff obtains Programmer, systems, Records, Test Results / Process Orders 0 []  - Staff telephones HHA, Nursing Homes / Clarify orders / etc 0 []  - Routine Transfer to another Facility (non-emergent condition) 0 [name], [name] ([id number]) []  - Routine Hospital Admission (non-emergent condition) 0 []  - New Admissions / Biomedical engineer / Ordering NPWT, Apligraf, etc. 0 []  - Emergency Hospital Admission (emergent condition) 0 X - Simple Discharge Coordination 1 10 []  - Complex (extensive) Discharge Coordination 0 PROCESS - Special Needs []  - Pediatric / Minor Patient Management 0 []  - Isolation Patient Management 0 []  - Hearing / Language / Visual special needs 0 []  - Assessment of Community assistance (transportation, D/C planning, etc.) 0 []  - Additional assistance / Altered mentation 0 []  - Support Surface(s)  Assessment (bed, cushion, seat, etc.) 0 INTERVENTIONS - Wound Cleansing / Measurement X - Simple Wound Cleansing - one wound 1 5 []  - Complex Wound Cleansing - multiple wounds 0 X -  Wound Imaging (photographs - any number of wounds) 1 5 []  - Wound Tracing (instead of photographs) 0 X - Simple Wound Measurement - one wound 1 5 []  - Complex Wound Measurement - multiple wounds 0 INTERVENTIONS - Wound Dressings X - Small Wound Dressing one or multiple wounds 1 10 []  - Medium Wound Dressing one or multiple wounds 0 []  - Large Wound Dressing one or multiple wounds 0 []  - Application of Medications - topical 0 []  - Application of Medications - injection 0 INTERVENTIONS - Miscellaneous []  - External ear exam 0 [name], [name] ([id number]) []  - Specimen Collection (cultures, biopsies, blood, body fluids, etc.) 0 []  - Specimen(s) / Culture(s) sent or taken to Lab for analysis 0 []  - Patient Transfer (multiple staff / [name] / Similar devices) 0 []  - Simple Staple / Suture removal (25 or less) 0 []  - Complex Staple / Suture removal (26 or more) 0 []  - Hypo / Hyperglycemic Management (close monitor of Blood Glucose) 0 []  - Ankle / Brachial Index (ABI) - do not check if billed separately 0 X - Vital Signs 1 5 Has the patient been seen at the hospital within the last three years: Yes Total Score: 80 Level Of Care: New/Established - Level 3 Electronic Signature(s) Signed: 12/08/2015 4:59:11 PM By: [name] Entered By: [name] on 12/08/2015 14:45:04 [name] ([id number]) -------------------------------------------------------------------------------- Encounter Discharge Information Details Patient Name: [name] Date of Service: 12/08/2015 2:15 PM Medical Record Number: [id number] Patient Account Number: [id number] Date of Birth/Sex: August 29, 1940 (75 y.o. Male) Treating RN: [name] Primary Care Physician: [name] Other Clinician: Referring  Physician: Jani [name] Treating Physician/Extender: [name] in Treatment: 7 Encounter Discharge Information Items Discharge Pain Level: 0 Discharge Condition: Stable Ambulatory Status: Ambulatory Discharge Destination: Home Transportation: Private Auto Accompanied By: wife Schedule Follow-up Appointment: Yes Medication Reconciliation completed and provided to Patient/Care Yes Mahina Salatino: Provided on Clinical Summary of Care: 12/08/2015 Form Type Recipient Paper Patient BP Electronic Signature(s) Signed: 12/08/2015 2:57:43 PM By: Ruthine Dose Entered By: Ruthine Dose on 12/08/2015 14:57:43 Siemen, [name] Kitchen ([id number]) -------------------------------------------------------------------------------- Lower Extremity Assessment Details Patient Name: [name] Date of Service: 12/08/2015 2:15 PM Medical Record Number: [id number] Patient Account Number: [id number] Date of Birth/Sex: [birth year] (75 y.o. Male) Treating RN: [name] Primary Care Physician: [name] Other Clinician: Referring Physician: Jani [name] Treating Physician/Extender: [name] in Treatment: 7 Vascular Assessment Pulses: Posterior Tibial Dorsalis Pedis Palpable: [Left:Yes] Extremity colors, hair growth, and conditions: Extremity Color: [Left:Normal] Temperature of Extremity: [Left:Warm] Capillary Refill: [Left:< 3 seconds] Electronic Signature(s) Signed: 12/08/2015 4:59:27 PM By: [name] Entered By: [name] on 12/08/2015 14:26:32 [place], [name] Kitchen ([id number]) -------------------------------------------------------------------------------- Multi Wound Chart Details Patient Name: [name] Date of Service: 12/08/2015 2:15 PM Medical Record Number: [id number] Patient Account Number: [id number] Date of Birth/Sex: 1940/09/30 (75 y.o. Male) Treating RN: [name] Primary Care Physician: [name] Other Clinician: Referring Physician:  Jani [name] Treating Physician/Extender: [name] in Treatment: 7 Vital Signs Height(in): 70 Pulse(bpm): 63 Weight(lbs): 165.7 Blood Pressure 120/60 (mmHg): Body Mass Index(BMI): 24 Temperature(F): 98.3 Respiratory Rate 16 (breaths/min): Photos: [1:No Photos] [N/A:N/A] Wound Location: [1:Left Lower Leg - Anterior] [N/A:N/A] Wounding Event: [1:Gradually Appeared] [N/A:N/A] Primary Etiology: [1:Diabetic Wound/Ulcer of the Lower Extremity] [N/A:N/A] Comorbid History: [1:Cataracts, Anemia, Hypertension, Type II Diabetes, Osteoarthritis] [N/A:N/A] Date Acquired: [1:09/21/2015] [N/A:N/A] Weeks of Treatment: [1:7] [N/A:N/A] Wound Status: [1:Open] [N/A:N/A] Measurements L x W x D 1x0.6x0.1 [N/A:N/A] (cm) Area (cm) : [  1:0.471] [N/A:N/A] Volume (cm) : [1:0.047] [N/A:N/A] % Reduction in Area: [1:81.00%] [N/A:N/A] % Reduction in Volume: 81.00% [N/A:N/A] Classification: [1:Grade 1] [N/A:N/A] Exudate Amount: [1:Medium] [N/A:N/A] Exudate Type: [1:Serosanguineous] [N/A:N/A] Exudate Color: [1:red, brown] [N/A:N/A] Wound Margin: [1:Distinct, outline attached] [N/A:N/A] Granulation Amount: [1:Large (67-100%)] [N/A:N/A] Granulation Quality: [1:Red] [N/A:N/A] Necrotic Amount: [1:None Present (0%)] [N/A:N/A] Exposed Structures: [1:Fascia: No Fat: No Tendon: No Muscle: No Joint: No] [N/A:N/A] Bone: No Limited to Skin Breakdown Epithelialization: Small (1-33%) N/A N/A Periwound Skin Texture: Edema: No N/A N/A Excoriation: No Induration: No Callus: No Crepitus: No Fluctuance: No Friable: No Rash: No Scarring: No Periwound Skin Maceration: No N/A N/A Moisture: Moist: No Dry/Scaly: No Periwound Skin Color: Atrophie Blanche: No N/A N/A Cyanosis: No Ecchymosis: No Erythema: No Hemosiderin Staining: No Mottled: No Pallor: No Rubor: No Temperature: No Abnormality N/A N/A Tenderness on Yes N/A N/A Palpation: Wound Preparation: Ulcer Cleansing: N/A  N/A Rinsed/Irrigated with Saline Topical Anesthetic Applied: Other: lidocaine 4% Treatment Notes Electronic Signature(s) Signed: 12/08/2015 4:59:11 PM By: [name] Entered By: [name] on 12/08/2015 14:43:52 [place], Tuckahoe[place]land Kitchen ([id number]) -------------------------------------------------------------------------------- Multi-Disciplinary Care Plan Details Patient Name: [name] Date of Service: 12/08/2015 2:15 PM Medical Record Number: [id number] Patient Account Number: [id number] Date of Birth/Sex: [birth year] (75 y.o. Male) Treating RN: [name] Primary Care Physician: [name] Other Clinician: Referring Physician: Jani [name] Treating Physician/Extender: [name] in Treatment: 7 Active Inactive Abuse / Safety / Falls / Self Care Management Nursing Diagnoses: Potential for falls Goals: Patient will remain injury free Date Initiated: 10/16/2015 Goal Status: Active Interventions: Assess fall risk on admission and as needed Notes: Nutrition Nursing Diagnoses: Imbalanced nutrition Impaired glucose control: actual or potential Goals: Patient/caregiver agrees to and verbalizes understanding of need to use nutritional supplements and/or vitamins as prescribed Date Initiated: 10/16/2015 Goal Status: Active Patient/caregiver verbalizes understanding of need to maintain therapeutic glucose control per primary care physician Date Initiated: 10/16/2015 Goal Status: Active Interventions: Assess patient nutrition upon admission and as needed per policy Provide education on elevated blood sugars and impact on wound healing Notes: [place], Tarrin R. ([id number]) Orientation to the Wound Care Program Nursing Diagnoses: Knowledge deficit related to the wound healing center program Goals: Patient/caregiver will verbalize understanding of the [facility] Date Initiated: 10/16/2015 Goal Status: Active Interventions: Provide  education on orientation to the wound center Notes: Pain, Acute or Chronic Nursing Diagnoses: Pain, acute or chronic: actual or potential Potential alteration in comfort, pain Goals: Patient will verbalize adequate pain control and receive pain control interventions during procedures as needed Date Initiated: 10/16/2015 Goal Status: Active Interventions: Assess comfort goal upon admission Complete pain assessment as per visit requirements Notes: Wound/Skin Impairment Nursing Diagnoses: Impaired tissue integrity Goals: Ulcer/skin breakdown will have a volume reduction of 30% by week 4 Date Initiated: 10/16/2015 Goal Status: Active Ulcer/skin breakdown will have a volume reduction of 50% by week 8 Date Initiated: 10/16/2015 Goal Status: Active Ulcer/skin breakdown will have a volume reduction of 80% by week 12 Date Initiated: 10/16/2015 GABREIL, [name] ([id number]) Goal Status: Active Interventions: Assess patient/caregiver ability to perform ulcer/skin care regimen upon admission and as needed Assess ulceration(s) every visit Notes: Electronic Signature(s) Signed: 12/08/2015 4:59:11 PM By: [name] Entered By: [name] on 12/08/2015 14:43:44 Dang, [name] Kitchen ([id number]) -------------------------------------------------------------------------------- Pain Assessment Details Patient Name: [name] Date of Service: 12/08/2015 2:15 PM Medical Record Number: [id number] Patient Account Number: [id number] Date of Birth/Sex: Oct 23, 1940 (75 y.o. Male) Treating RN: [name] Primary Care  Physician: [name] Other Clinician: Referring Physician: Jani [name] Treating Physician/Extender: [name] in Treatment: 7 Active Problems Location of Pain Severity and Description of Pain Patient Has Paino No Site Locations With Dressing Change: No Pain Management and Medication Current Pain Management: Electronic Signature(s) Signed: 12/08/2015  4:59:27 PM By: [name] Entered By: [name] on 12/08/2015 14:22:47 [place], [name] ([id number]) -------------------------------------------------------------------------------- Patient/Caregiver Education Details Patient Name: [name] Date of Service: 12/08/2015 2:15 PM Medical Record Number: [id number] Patient Account Number: [id number] Date of Birth/Gender: [birth year] (75 y.o. Male) Treating RN: [name] Primary Care Physician: [name] Other Clinician: Referring Physician: Jani [name] Treating Physician/Extender: [name] in Treatment: 7 Education Assessment Education Provided To: Patient Education Topics Provided Wound/Skin Impairment: Handouts: Other: change dressing as ordered Methods: Demonstration, Explain/Verbal Responses: State content correctly Electronic Signature(s) Signed: 12/08/2015 4:59:27 PM By: [name] Entered By: [name] on 12/08/2015 14:37:20 [place], [place]. ([id number]) -------------------------------------------------------------------------------- Wound Assessment Details Patient Name: [name] Date of Service: 12/08/2015 2:15 PM Medical Record Number: [id number] Patient Account Number: [id number] Date of Birth/Sex: 12/17/1940 (75 y.o. Male) Treating RN: [name] Primary Care Physician: [name] Other Clinician: Referring Physician: Jani [name] Treating Physician/Extender: [name] in Treatment: 7 Wound Status Wound Number: 1 Primary Diabetic Wound/Ulcer of the Lower Etiology: Extremity Wound Location: Left Lower Leg - Anterior Wound Open Wounding Event: Gradually Appeared Status: Date Acquired: 09/21/2015 Comorbid Cataracts, Anemia, Hypertension, Weeks Of Treatment: 7 History: Type II Diabetes, Osteoarthritis Clustered Wound: No Photos Photo Uploaded By: [name] on 12/08/2015 15:11:58 Wound Measurements Length: (cm) 1 Width: (cm)  0.6 Depth: (cm) 0.1 Area: (cm) 0.471 Volume: (cm) 0.047 % Reduction in Area: 81% % Reduction in Volume: 81% Epithelialization: Small (1-33%) Tunneling: No Undermining: No Wound Description Classification: Grade 1 Wound Margin: Distinct, outline attached Exudate Amount: Medium Exudate Type: Serosanguineous Exudate Color: red, brown Foul Odor After Cleansing: No Wound Bed Granulation Amount: Large (67-100%) Exposed Structure Granulation Quality: Red Fascia Exposed: No Necrotic Amount: None Present (0%) Fat Layer Exposed: No Tendon Exposed: No [place], [name] ([id number]) Muscle Exposed: No Joint Exposed: No Bone Exposed: No Limited to Skin Breakdown Periwound Skin Texture Texture Color No Abnormalities Noted: No No Abnormalities Noted: No Callus: No Atrophie Blanche: No Crepitus: No Cyanosis: No Excoriation: No Ecchymosis: No Fluctuance: No Erythema: No Friable: No Hemosiderin Staining: No Induration: No Mottled: No Localized Edema: No Pallor: No Rash: No Rubor: No Scarring: No Temperature / Pain Moisture Temperature: No Abnormality No Abnormalities Noted: No Tenderness on Palpation: Yes Dry / Scaly: No Maceration: No Moist: No Wound Preparation Ulcer Cleansing: Rinsed/Irrigated with Saline Topical Anesthetic Applied: Other: lidocaine 4%, Treatment Notes Wound #1 (Left, Anterior Lower Leg) 1. Cleansed with: Clean wound with Normal Saline 2. Anesthetic Topical Lidocaine 4% cream to wound bed prior to debridement 3. Peri-wound Care: Skin Prep 4. Dressing Applied: Hydrafera Blue 5. Secondary Dressing Applied Bordered Foam Dressing Electronic Signature(s) Signed: 12/08/2015 4:59:27 PM By: [name] Entered By: [name] on 12/08/2015 14:31:28 [place], [name] Kitchen ([id number]) -------------------------------------------------------------------------------- Vitals Details Patient Name: [name] Date of Service:  12/08/2015 2:15 PM Medical Record Number: [id number] Patient Account Number: [id number] Date of Birth/Sex: May 08, 1940 (75 y.o. Male) Treating RN: [name] Primary Care Physician: [name] Other Clinician: Referring Physician: Jani [name] Treating Physician/Extender: [name] in Treatment: 7 Vital Signs Time Taken: 14:22 Temperature (F): 98.3 Height (in): 70 Pulse (bpm): 63 Weight (lbs): 165.7 Respiratory Rate (breaths/min): 16 Body Mass  Index (BMI): 23.8 Blood Pressure (mmHg): 120/60 Reference Range: 80 - 120 mg / dl Electronic Signature(s) Signed: 12/08/2015 4:59:27 PM By: [name] Entered By: [name] on 12/08/2015 14:25:43

## 2015-12-09 NOTE — Progress Notes (Signed)
ISMAR, YABUT ([id number]) Visit Report for 12/08/2015 Chief Complaint Document Details Patient Name: [name], [name] Date of Service: 12/08/2015 2:15 PM Medical Record Number: [id number] Patient Account Number: [id number] Date of Birth/Sex: 17-Jun-1940 (75 y.o. Male) Treating RN: Ahmed Prima Primary Care Physician: Jani Gravel Other Clinician: Referring Physician: Jani Gravel Treating Physician/Extender: Frann Rider in Treatment: 7 Information Obtained from: Patient Chief Complaint Patients presents for treatment of an open diabetic ulcer left lower extremity with swelling, cellulitis and ulceration for about a month Electronic Signature(s) Signed: 12/08/2015 3:04:08 PM By: Christin Fudge MD, FACS Entered By: Christin Fudge on 12/08/2015 15:04:08 Geurin, [name] ([id number]) -------------------------------------------------------------------------------- HPI Details Patient Name: [name] Date of Service: 12/08/2015 2:15 PM Medical Record Number: [id number] Patient Account Number: [id number] Date of Birth/Sex: 06-19-1940 (75 y.o. Male) Treating RN: Ahmed Prima Primary Care Physician: Jani Gravel Other Clinician: Referring Physician: Jani Gravel Treating Physician/Extender: Frann Rider in Treatment: 7 History of Present Illness Location: left lower extremity Quality: Patient reports experiencing a dull pain to affected area(s). Severity: Patient states wound are getting worse. Duration: Patient has had the wound for < 4 weeks prior to presenting for treatment Timing: Pain in wound is Intermittent (comes and goes Context: The wound appeared gradually over time Modifying Factors: Other treatment(s) tried include:hospital admission with IV antibiotics and workup Associated Signs and Symptoms: Patient reports having increase swelling. HPI Description: 75 year old gentleman who was been having his primary care taken care of by Dr. Maudie Mercury at [facility] has had recurrent cellulitis the left lower extremity and so far has had 3 attacks. First started in August of this year and was admitted to the ER and sent home with Keflex and. He saw his primary care doctor on August 9 and was referred to [facility] where they began intravenous antibiotics and was admitted to August 15. He at present on doxycycline 100 mg twice a day. 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 Never been a smoker. During the admission he had a ultrasound done to look for a abscess and none was found except for subcutaneous edema and there was no focal muscle fluid collection. Xray of the left lower extremity --IMPRESSION:Soft tissue swelling and stranding. No subcutaneous gas or acute osseous abnormality identified about the left tib-fib. oOf note there was a question of Coumadin induced skin necrosis and has been on Coumadin since the late 90's. Last hemoglobin A1c was 6.7. 10/26/2015 -- arterial and venous duplex studies were done but report is still pending. 11/02/2015 -- a lower extremity venous duplex reflux evaluation -- there was no evidence of DVT or SVT in the left lower extremity. 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 No further Vascular cosultation was recommended for this. Arterial studies done showed he had noncompressible vessels bilaterally and hence ABI was not reliable. The right toe brachial index was 0.56 which was abnormal and the left was 0.81 which was normal. Doppler signals  were triphasic bilaterally both posterior tibial and dorsalis pedis. 11/24/15: f/u today. no new complaints or concerns. easily palpable peripheral pulses bilaterally. there is development of hypergranulated tissue  within the wound bed. denies fever, chills or body aches. DINNIS, ROG ([id number]) 12/01/15: returns to clinic today without new wounds or skin breakdown. he reports wound is looking better. denies fever, chills, body aches or malaise. Electronic Signature(s) Signed: 12/08/2015 3:04:15 PM By: Christin Fudge MD, FACS Entered By: Christin Fudge on 12/08/2015 15:04:15 ADE, STMARIE ([id number]) -------------------------------------------------------------------------------- Physical Exam Details Patient Name: [name] Date of Service: 12/08/2015 2:15 PM Medical Record Number: [id number] Patient Account Number: [id number] Date of Birth/Sex: June 17, 1940 (75 y.o. Male) Treating RN: Ahmed Prima Primary Care Physician: Jani Gravel Other Clinician: Referring Physician: Jani Gravel Treating Physician/Extender: Frann Rider in Treatment: 7 Constitutional . Pulse regular. Respirations normal and unlabored. Afebrile. . Eyes Nonicteric. Reactive to light. Ears, Nose, Mouth, and Throat Lips, teeth, and gums WNL.[name] Moist mucosa without lesions. Neck supple and nontender. No palpable supraclavicular or cervical adenopathy. Normal sized without goiter. Respiratory WNL. No retractions.. Cardiovascular Pedal Pulses WNL. No clubbing, cyanosis or edema. Lymphatic No adneopathy. No adenopathy. No adenopathy. Musculoskeletal Adexa without tenderness or enlargement.. Digits and nails w/o clubbing, cyanosis, infection, petechiae, ischemia, or inflammatory conditions.. Integumentary (Hair, Skin) No suspicious lesions. No crepitus or fluctuance. No peri-wound warmth or erythema. No masses.[name] Psychiatric Judgement and insight Intact.. No evidence of depression, anxiety, or  agitation.. Notes wound looks very good his much smaller in size and there is healthy epithelization. The ulcer base looks clean and no sharp debridement was required today. Electronic Signature(s) Signed: 12/08/2015 3:04:48 PM By: Christin Fudge MD, FACS Entered By: Christin Fudge on 12/08/2015 15:04:48 Holleran, [name] ([id number]) -------------------------------------------------------------------------------- Physician Orders Details Patient Name: [name] Date of Service: 12/08/2015 2:15 PM Medical Record Number: [id number] Patient Account Number: [id number] Date of Birth/Sex: February 24, 1940 (75 y.o. Male) Treating RN: Montey Hora Primary Care Physician: Jani Gravel Other Clinician: Referring Physician: Jani Gravel Treating Physician/Extender: Frann Rider in Treatment: 7 Verbal / Phone Orders: Yes Clinician: Montey Hora Read Back and Verified: Yes Diagnosis Coding Wound Cleansing Wound #1 Left,Anterior Lower Leg o Clean wound with wound cleanser. o Cleanse wound with mild soap and water o May Shower, gently pat wound dry prior to applying new dressing. Anesthetic Wound #1 Left,Anterior Lower Leg o Topical Lidocaine 4% cream applied to wound bed prior to debridement Skin Barriers/Peri-Wound Care Wound #1 Left,Anterior Lower Leg o Skin Prep Primary Wound Dressing Wound #1 Left,Anterior Lower Leg o Hydrafera Blue Secondary Dressing Wound #1 Left,Anterior Lower Leg o Boardered Foam Dressing - telfa island Dressing Change Frequency Wound #1 Left,Anterior Lower Leg o Three times weekly Follow-up Appointments Wound #1 Left,Anterior Lower Leg o Return Appointment in 1 week. Edema Control Wound #1 Left,Anterior Lower Leg o Elevate legs to the level of the heart and pump ankles as often as possible o Other: - wear compression stockings Parkin, Octave R. ([id number]) Additional Orders / Instructions Wound #1 Left,Anterior Lower  Leg o Increase protein intake. Home Health Wound #1 Left,Anterior Lower Leg o D/C Home Health Services Medications-please add to medication list. Wound #1 Left,Anterior Lower Leg o Other: - Vitamin A, Vitamin C, Zinc Electronic Signature(s) Signed: 12/08/2015 4:40:13 PM By: Christin Fudge MD, FACS Signed: 12/08/2015 4:59:11 PM By: Montey Hora Entered By: Montey Hora on 12/08/2015 14:59:22 Lukens, Selma RMarland Kitchen ([id number]) -------------------------------------------------------------------------------- Problem List Details Patient Name: [name] Date of Service: 12/08/2015 2:15 PM Medical Record Number: [id number] Patient Account Number: [id number] Date of Birth/Sex: Dec 02, 1940 (75 y.o. 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 Male) Treating RN: Ahmed Prima Primary Care Physician: Jani Gravel Other Clinician: Referring Physician: Jani Gravel Treating Physician/Extender: Frann Rider in Treatment: 7 Subjective Chief Complaint Information obtained from  Patient Patients presents for treatment of an open diabetic ulcer left lower extremity with swelling, cellulitis and ulceration for about a month History of Present Illness (HPI) The following HPI elements were documented for the patient's wound: Location: left lower extremity Quality: Patient reports experiencing a dull pain to affected area(s). Severity: Patient states wound are getting worse. Duration: Patient has had the wound for < 4 weeks prior to presenting for treatment Timing: Pain in wound is Intermittent (comes and goes Context: The wound appeared gradually over time Modifying Factors: Other treatment(s) tried include:hospital admission with IV antibiotics and workup Associated Signs and Symptoms: Patient reports having increase swelling. 75 year old gentleman who was been having his primary care taken care of by Dr. Maudie Mercury at [facility] has had recurrent cellulitis the left lower extremity and so far has had 3 attacks. First started in August of this year and was admitted to the ER and sent home with Keflex and. He saw his primary care doctor on August 9 and was referred to [facility] where they began intravenous antibiotics and was admitted to August 15. He at present on doxycycline 100 mg twice a day. 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 Never been a smoker. During the admission he had a ultrasound done to look for a abscess and none was found except for subcutaneous edema and there was no focal muscle fluid  collection. Xray of the left lower extremity --IMPRESSION:Soft tissue swelling and stranding. No subcutaneous gas or acute osseous abnormality identified about the left tib-fib. Of note there was a question of Coumadin induced skin necrosis and has been on Coumadin since the late 90's. Last hemoglobin A1c was 6.7. 10/26/2015 -- arterial and venous duplex studies were done but report is still pending. 11/02/2015 -- a lower extremity venous duplex reflux evaluation -- there was no evidence of DVT or SVT in Rammel, Cezar R. ([id number]) the left lower extremity. 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 No further Vascular cosultation was recommended for this. Arterial studies done showed he had noncompressible vessels bilaterally and hence ABI was not reliable. The right toe brachial index was 0.56 which was abnormal and the left was 0.81 which was normal. Doppler signals were triphasic bilaterally both posterior tibial and dorsalis pedis. 11/24/15: f/u today. no new complaints or concerns. easily  palpable peripheral pulses bilaterally. there is development of hypergranulated tissue within the wound bed. denies fever, chills or body aches. 12/01/15: returns to clinic today without new wounds or skin breakdown. he reports wound is looking better. denies fever, chills, body aches or malaise. Objective Constitutional Pulse regular. Respirations normal and unlabored. Afebrile. Vitals Time Taken: 2:22 PM, Height: 70 in, Weight: 165.7 lbs, BMI: 23.8, Temperature: 98.3 F, Pulse: 63 bpm, Respiratory Rate: 16 breaths/min, Blood Pressure: 120/60 mmHg. Eyes Nonicteric. Reactive to light. Ears, Nose, Mouth, and Throat Lips, teeth, and gums WNL.[name] Moist mucosa without lesions. Neck supple and nontender. No palpable supraclavicular or cervical adenopathy. Normal sized without goiter. Respiratory WNL. No retractions.. Cardiovascular Pedal Pulses WNL. No clubbing, cyanosis or  edema. Lymphatic No adneopathy. No adenopathy. No adenopathy. Musculoskeletal Adexa without tenderness or enlargement.. Digits and nails w/o clubbing, cyanosis, infection, petechiae, ischemia, or inflammatory conditions.[name] Psychiatric Judgement and insight Intact.. No evidence of depression, anxiety, or agitation.[name] Stroh, Masyn R. ([id number]) General Notes: wound looks very good his much smaller in size and there is healthy epithelization. The ulcer base looks clean and no sharp debridement was required today. Integumentary (Hair, Skin) No suspicious lesions. No crepitus or fluctuance. No peri-wound warmth or erythema. No masses.. Wound #1 status is Open. Original cause of wound was Gradually Appeared. The wound is located on the Left,Anterior Lower Leg. The wound measures 1cm length x 0.6cm width x 0.1cm depth; 0.471cm^2 area and 0.047cm^3 volume. The wound is limited to skin breakdown. There is no tunneling or undermining noted. There is a medium amount of serosanguineous drainage noted. The wound margin is distinct with the outline attached to the wound base. There is large (67-100%) red granulation within the wound bed. There is no necrotic tissue within the wound bed. The periwound skin appearance did not exhibit: Callus, Crepitus, Excoriation, Fluctuance, Friable, Induration, Localized Edema, Rash, Scarring, Dry/Scaly, Maceration, Moist, Atrophie Blanche, Cyanosis, Ecchymosis, Hemosiderin Staining, Mottled, Pallor, Rubor, Erythema. Periwound temperature was noted as No Abnormality. The periwound has tenderness on palpation. Assessment Active Problems ICD-10 E11.622 - Type 2 diabetes mellitus with other skin ulcer L97.222 - Non-pressure chronic ulcer of left calf with fat layer exposed I89.0 - Lymphedema, not elsewhere classified Plan Wound Cleansing: Wound #1 Left,Anterior Lower Leg: Clean wound with wound cleanser. Cleanse wound with mild soap and water May Shower, gently  pat wound dry prior to applying new dressing. Anesthetic: Wound #1 Left,Anterior Lower Leg: Topical Lidocaine 4% cream applied to wound bed prior to debridement Skin Barriers/Peri-Wound Care: Wound #1 Left,Anterior Lower Leg: Skin Prep Primary Wound Dressing: Wound #1 Left,Anterior Lower Leg: Ghee, Rock R. ([id number]) Hydrafera Blue Secondary Dressing: Wound #1 Left,Anterior Lower Leg: Boardered Foam Dressing - telfa island Dressing Change Frequency: Wound #1 Left,Anterior Lower Leg: Three times weekly Follow-up Appointments: Wound #1 Left,Anterior Lower Leg: Return Appointment in 1 week. Edema Control: Wound #1 Left,Anterior Lower Leg: Elevate legs to the level of the heart and pump ankles as often as possible Other: - wear compression stockings Additional Orders / Instructions: Wound #1 Left,Anterior Lower Leg: Increase protein intake. Home Health: Wound #1 Left,Anterior Lower Leg: D/C Home Health Services Medications-please add to medication list.: Wound #1 Left,Anterior Lower Leg: Other: - Vitamin A, Vitamin C, Zinc have recommended: 1. to use hydrofera blueand a bordered foam, to be changed every other day 2. light compression stockings 20-30 mm of Hg to be worn all day 3. protein, vitamin A, vitamin C and zinc 4. regular visits to the wound center  He and his wife have had all QUESTIONS answered and will be compliant Electronic Signature(s) Signed: 12/08/2015 3:05:41 PM By: Christin Fudge MD, FACS Entered By: Christin Fudge on 12/08/2015 15:05:41 Garman, [name] ([id number]) -------------------------------------------------------------------------------- SuperBill Details Patient Name: [name] Date of Service: 12/08/2015 Medical Record Number: [id number] Patient Account Number: [id number] Date of Birth/Sex: 1940-07-23 (75 y.o. Male) Treating RN: Ahmed Prima Primary Care Physician: Jani Gravel Other Clinician: Referring Physician: Jani Gravel Treating Physician/Extender: Frann Rider in Treatment: 7 Diagnosis Coding ICD-10 Codes Code Description [phone] Type 2 diabetes mellitus with other skin ulcer L97.222 Non-pressure chronic ulcer of left calf with fat layer exposed I89.0 Lymphedema, not elsewhere classified Facility Procedures CPT4 Code: [id number] Description: 99213 - WOUND CARE VISIT-LEV 3 EST PT Modifier: Quantity: 1 Physician Procedures CPT4 Code: [id number] Description: 12787 - WC PHYS LEVEL 3 - EST PT ICD-10 Description Diagnosis E11.622 Type 2 diabetes mellitus with other skin ulcer L97.222 Non-pressure chronic ulcer of left calf with fat I89.0 Lymphedema, not elsewhere classified Modifier: layer exposed Quantity: 1 Electronic Signature(s) Signed: 12/08/2015 3:05:52 PM By: Christin Fudge MD, FACS Entered By: Christin Fudge on 12/08/2015 15:05:52

## 2015-12-11 DIAGNOSIS — E11622 Type 2 diabetes mellitus with other skin ulcer: Secondary | ICD-10-CM | POA: Diagnosis not present

## 2015-12-11 DIAGNOSIS — L97222 Non-pressure chronic ulcer of left calf with fat layer exposed: Secondary | ICD-10-CM | POA: Diagnosis not present

## 2015-12-11 DIAGNOSIS — D693 Immune thrombocytopenic purpura: Secondary | ICD-10-CM | POA: Diagnosis not present

## 2015-12-11 DIAGNOSIS — K7460 Unspecified cirrhosis of liver: Secondary | ICD-10-CM | POA: Diagnosis not present

## 2015-12-11 DIAGNOSIS — Z4800 Encounter for change or removal of nonsurgical wound dressing: Secondary | ICD-10-CM | POA: Diagnosis not present

## 2015-12-11 DIAGNOSIS — I10 Essential (primary) hypertension: Secondary | ICD-10-CM | POA: Diagnosis not present

## 2015-12-11 DIAGNOSIS — Z79891 Long term (current) use of opiate analgesic: Secondary | ICD-10-CM | POA: Diagnosis not present

## 2015-12-11 DIAGNOSIS — Z7984 Long term (current) use of oral hypoglycemic drugs: Secondary | ICD-10-CM | POA: Diagnosis not present

## 2015-12-11 DIAGNOSIS — Z7901 Long term (current) use of anticoagulants: Secondary | ICD-10-CM | POA: Diagnosis not present

## 2015-12-11 DIAGNOSIS — D649 Anemia, unspecified: Secondary | ICD-10-CM | POA: Diagnosis not present

## 2015-12-15 ENCOUNTER — Encounter: Payer: Medicare Other | Admitting: Surgery

## 2015-12-15 DIAGNOSIS — Z86718 Personal history of other venous thrombosis and embolism: Secondary | ICD-10-CM | POA: Diagnosis not present

## 2015-12-15 DIAGNOSIS — L97222 Non-pressure chronic ulcer of left calf with fat layer exposed: Secondary | ICD-10-CM | POA: Diagnosis not present

## 2015-12-15 DIAGNOSIS — E785 Hyperlipidemia, unspecified: Secondary | ICD-10-CM | POA: Diagnosis not present

## 2015-12-15 DIAGNOSIS — I6523 Occlusion and stenosis of bilateral carotid arteries: Secondary | ICD-10-CM | POA: Diagnosis not present

## 2015-12-15 DIAGNOSIS — I10 Essential (primary) hypertension: Secondary | ICD-10-CM | POA: Diagnosis not present

## 2015-12-15 DIAGNOSIS — M1990 Unspecified osteoarthritis, unspecified site: Secondary | ICD-10-CM | POA: Diagnosis not present

## 2015-12-15 DIAGNOSIS — Z7984 Long term (current) use of oral hypoglycemic drugs: Secondary | ICD-10-CM | POA: Diagnosis not present

## 2015-12-15 DIAGNOSIS — Z7901 Long term (current) use of anticoagulants: Secondary | ICD-10-CM | POA: Diagnosis not present

## 2015-12-15 DIAGNOSIS — L97821 Non-pressure chronic ulcer of other part of left lower leg limited to breakdown of skin: Secondary | ICD-10-CM | POA: Diagnosis not present

## 2015-12-15 DIAGNOSIS — I890 Lymphedema, not elsewhere classified: Secondary | ICD-10-CM | POA: Diagnosis not present

## 2015-12-15 DIAGNOSIS — E11622 Type 2 diabetes mellitus with other skin ulcer: Secondary | ICD-10-CM | POA: Diagnosis not present

## 2015-12-15 DIAGNOSIS — Z8673 Personal history of transient ischemic attack (TIA), and cerebral infarction without residual deficits: Secondary | ICD-10-CM | POA: Diagnosis not present

## 2015-12-15 DIAGNOSIS — D649 Anemia, unspecified: Secondary | ICD-10-CM | POA: Diagnosis not present

## 2015-12-15 DIAGNOSIS — Z79899 Other long term (current) drug therapy: Secondary | ICD-10-CM | POA: Diagnosis not present

## 2015-12-18 NOTE — Progress Notes (Signed)
Joshua, [name] ([id number]) Visit Report for 12/15/2015 Arrival Information Details Patient Name: [name], [name] Date of Service: 12/15/2015 12:45 PM Medical Record Number: [id number] Patient Account Number: [id number] Date of Birth/Sex: [birth year] (75 y.o. Male) Treating RN: Cornell Barman Primary Care Physician: Jani Gravel Other Clinician: Referring Physician: Jani Gravel Treating Physician/Extender: Frann Rider in Treatment: 8 Visit Information History Since Last Visit Added or deleted any medications: No Patient Arrived: Ambulatory Any new allergies or adverse reactions: No Arrival Time: 12:44 Had a fall or experienced change in No Accompanied By: wife activities of daily living that may affect Transfer Assistance: None risk of falls: Patient Identification Verified: Yes Signs or symptoms of abuse/neglect since last No Secondary Verification Process Yes visito Completed: Hospitalized since last visit: No Patient Requires No Has Dressing in Place as Prescribed: Yes Transmission-Based Pain Present Now: No Precautions: Patient Has Alerts: Yes Patient Alerts: Patient on Blood Thinner Warfarin DM II ABIs noncompressible Electronic Signature(s) Signed: 12/18/2015 3:33:39 PM By: Gretta Cool, RN, BSN, Kim RN, BSN Entered By: Gretta Cool, RN, BSN, Kim on 12/15/2015 12:45:22 Mclennan, [name] ([id number]) -------------------------------------------------------------------------------- Clinic Level of Care Assessment Details Patient Name: [name] Date of Service: 12/15/2015 12:45 PM Medical Record Number: [id number] Patient Account Number: [id number] Date of Birth/Sex: 1940/06/12 (75 y.o. Male) Treating RN: Cornell Barman Primary Care Physician: Jani Gravel Other Clinician: Referring Physician: Jani Gravel Treating Physician/Extender: Frann Rider in Treatment: 8 Clinic Level of Care Assessment Items TOOL 4 Quantity Score []  - Use when only an EandM is  performed on FOLLOW-UP visit 0 ASSESSMENTS - Nursing Assessment / Reassessment []  - Reassessment of Co-morbidities (includes updates in patient status) 0 X - Reassessment of Adherence to Treatment Plan 1 5 ASSESSMENTS - Wound and Skin Assessment / Reassessment X - Simple Wound Assessment / Reassessment - one wound 1 5 []  - Complex Wound Assessment / Reassessment - multiple wounds 0 []  - Dermatologic / Skin Assessment (not related to wound area) 0 ASSESSMENTS - Focused Assessment []  - Circumferential Edema Measurements - multi extremities 0 []  - Nutritional Assessment / Counseling / Intervention 0 []  - Lower Extremity Assessment (monofilament, tuning fork, pulses) 0 []  - Peripheral Arterial Disease Assessment (using hand held doppler) 0 ASSESSMENTS - Ostomy and/or Continence Assessment and Care []  - Incontinence Assessment and Management 0 []  - Ostomy Care Assessment and Management (repouching, etc.) 0 PROCESS - Coordination of Care X - Simple Patient / Family Education for ongoing care 1 15 []  - Complex (extensive) Patient / Family Education for ongoing care 0 []  - Staff obtains Programmer, systems, Records, Test Results / Process Orders 0 []  - Staff telephones HHA, Nursing Homes / Clarify orders / etc 0 []  - Routine Transfer to another Facility (non-emergent condition) 0 Cecchi, [place] R. ([id number]) []  - Routine Hospital Admission (non-emergent condition) 0 []  - New Admissions / Biomedical engineer / Ordering NPWT, Apligraf, etc. 0 []  - Emergency Hospital Admission (emergent condition) 0 X - Simple Discharge Coordination 1 10 []  - Complex (extensive) Discharge Coordination 0 PROCESS - Special Needs []  - Pediatric / Minor Patient Management 0 []  - Isolation Patient Management 0 []  - Hearing / Language / Visual special needs 0 []  - Assessment of Community assistance (transportation, D/C planning, etc.) 0 []  - Additional assistance / Altered mentation 0 []  - Support Surface(s) Assessment  (bed, cushion, seat, etc.) 0 INTERVENTIONS - Wound Cleansing / Measurement X - Simple Wound Cleansing - one wound 1 5 []  - Complex Wound Cleansing - multiple wounds  0 X - Wound Imaging (photographs - any number of wounds) 1 5 []  - Wound Tracing (instead of photographs) 0 X - Simple Wound Measurement - one wound 1 5 []  - Complex Wound Measurement - multiple wounds 0 INTERVENTIONS - Wound Dressings X - Small Wound Dressing one or multiple wounds 1 10 []  - Medium Wound Dressing one or multiple wounds 0 []  - Large Wound Dressing one or multiple wounds 0 []  - Application of Medications - topical 0 []  - Application of Medications - injection 0 INTERVENTIONS - Miscellaneous []  - External ear exam 0 [place]land, Haig R. ([id number]) []  - Specimen Collection (cultures, biopsies, blood, body fluids, etc.) 0 []  - Specimen(s) / Culture(s) sent or taken to Lab for analysis 0 []  - Patient Transfer (multiple staff / [name] / Similar devices) 0 []  - Simple Staple / Suture removal (25 or less) 0 []  - Complex Staple / Suture removal (26 or more) 0 []  - Hypo / Hyperglycemic Management (close monitor of Blood Glucose) 0 []  - Ankle / Brachial Index (ABI) - do not check if billed separately 0 X - Vital Signs 1 5 Has the patient been seen at the hospital within the last three years: Yes Total Score: 65 Level Of Care: New/Established - Level 2 Electronic Signature(s) Signed: 12/18/2015 3:33:39 PM By: Gretta Cool, RN, BSN, Kim RN, BSN Entered By: Gretta Cool, RN, BSN, Kim on 12/15/2015 12:59:19 [name] [name] ([id number]) -------------------------------------------------------------------------------- Encounter Discharge Information Details Patient Name: [name] Date of Service: 12/15/2015 12:45 PM Medical Record Number: [id number] Patient Account Number: [id number] Date of Birth/Sex: 01/14/1941 (75 y.o. Male) Treating RN: Montey Hora Primary Care Physician: Jani Gravel Other Clinician: Referring  Physician: Jani Gravel Treating Physician/Extender: Frann Rider in Treatment: 8 Encounter Discharge Information Items Discharge Pain Level: 0 Discharge Condition: Stable Ambulatory Status: Ambulatory Discharge Destination: Home Transportation: Private Auto Accompanied By: wife Schedule Follow-up Appointment: Yes Medication Reconciliation completed and provided to Patient/Care Yes Jocelyne Reinertsen: Provided on Clinical Summary of Care: 12/15/2015 Form Type Recipient Paper Patient BP Electronic Signature(s) Signed: 12/18/2015 3:33:39 PM By: Gretta Cool, RN, BSN, Kim RN, BSN Previous Signature: 12/15/2015 12:59:12 PM Version By: Ruthine Dose Entered By: Gretta Cool RN, BSN, Kim on 12/15/2015 13:00:06 [place], [name] ([id number]) -------------------------------------------------------------------------------- Lower Extremity Assessment Details Patient Name: [name] Date of Service: 12/15/2015 12:45 PM Medical Record Number: [id number] Patient Account Number: [id number] Date of Birth/Sex: November 26, 1940 (75 y.o. Male) Treating RN: Cornell Barman Primary Care Physician: Jani Gravel Other Clinician: Referring Physician: Jani Gravel Treating Physician/Extender: Frann Rider in Treatment: 8 Vascular Assessment Pulses: Posterior Tibial Dorsalis Pedis Palpable: [Left:Yes] Extremity colors, hair growth, and conditions: Extremity Color: [Left:Normal] Hair Growth on Extremity: [Left:No] Temperature of Extremity: [Left:Warm] Capillary Refill: [Left:< 3 seconds] Dependent Rubor: [Left:No] Blanched when Elevated: [Left:No] Lipodermatosclerosis: [Left:No] Toe Nail Assessment Left: Right: Thick: No Discolored: No Deformed: No Improper Length and Hygiene: No Electronic Signature(s) Signed: 12/18/2015 3:33:39 PM By: Gretta Cool, RN, BSN, Kim RN, BSN Entered By: Gretta Cool, RN, BSN, Kim on 12/15/2015 12:49:48 Hausner, Mychael Alfonso Patten  ([id number]) -------------------------------------------------------------------------------- Multi Wound Chart Details Patient Name: [name] Date of Service: 12/15/2015 12:45 PM Medical Record Number: [id number] Patient Account Number: [id number] Date of Birth/Sex: [birth year] (75 y.o. Male) Treating RN: Cornell Barman Primary Care Physician: Jani Gravel Other Clinician: Referring Physician: Jani Gravel Treating Physician/Extender: Frann Rider in Treatment: 8 Vital Signs Height(in): 70 Pulse(bpm): 66 Weight(lbs): 165.7 Blood Pressure 148/72 (mmHg): Body Mass Index(BMI): 24 Temperature(F): 97.8 Respiratory Rate 16 (breaths/min): Photos: [  N/A:N/A] Wound Location: Left Lower Leg - Anterior N/A N/A Wounding Event: Gradually Appeared N/A N/A Primary Etiology: Diabetic Wound/Ulcer of N/A N/A the Lower Extremity Comorbid History: Cataracts, Anemia, N/A N/A Hypertension, Type II Diabetes, Osteoarthritis Date Acquired: 09/21/2015 N/A N/A Weeks of Treatment: 8 N/A N/A Wound Status: Open N/A N/A Measurements L x W x D 0.4x0.5x0.1 N/A N/A (cm) Area (cm) : 0.157 N/A N/A Volume (cm) : 0.016 N/A N/A % Reduction in Area: 93.70% N/A N/A % Reduction in Volume: 93.50% N/A N/A Classification: Grade 1 N/A N/A Exudate Amount: Medium N/A N/A Exudate Type: Serosanguineous N/A N/A Exudate Color: red, brown N/A N/A Wound Margin: Distinct, outline attached N/A N/A Granulation Amount: Large (67-100%) N/A N/A Granulation Quality: Red N/A N/A Lias, [place] R. ([id number]) Necrotic Amount: None Present (0%) N/A N/A Exposed Structures: Fascia: No N/A N/A Fat: No Tendon: No Muscle: No Joint: No Bone: No Limited to Skin Breakdown Epithelialization: Medium (34-66%) N/A N/A Periwound Skin Texture: Edema: No N/A N/A Excoriation: No Induration: No Callus: No Crepitus: No Fluctuance: No Friable: No Rash: No Scarring: No Periwound Skin Maceration: No N/A  N/A Moisture: Moist: No Dry/Scaly: No Periwound Skin Color: Atrophie Blanche: No N/A N/A Cyanosis: No Ecchymosis: No Erythema: No Hemosiderin Staining: No Mottled: No Pallor: No Rubor: No Temperature: No Abnormality N/A N/A Tenderness on Yes N/A N/A Palpation: Wound Preparation: Ulcer Cleansing: N/A N/A Rinsed/Irrigated with Saline Topical Anesthetic Applied: None Treatment Notes Electronic Signature(s) Signed: 12/18/2015 3:33:39 PM By: Gretta Cool, RN, BSN, Kim RN, BSN Entered By: Gretta Cool, RN, BSN, Kim on 12/15/2015 12:58:10 Heavin, [name] ([id number]) -------------------------------------------------------------------------------- Multi-Disciplinary Care Plan Details Patient Name: [name] Date of Service: 12/15/2015 12:45 PM Medical Record Number: [id number] Patient Account Number: [id number] Date of Birth/Sex: 1940-09-27 (75 y.o. Male) Treating RN: Cornell Barman Primary Care Physician: Jani Gravel Other Clinician: Referring Physician: Jani Gravel Treating Physician/Extender: Frann Rider in Treatment: 8 Active Inactive Abuse / Safety / Falls / Self Care Management Nursing Diagnoses: Potential for falls Goals: Patient will remain injury free Date Initiated: 10/16/2015 Goal Status: Active Interventions: Assess fall risk on admission and as needed Notes: Nutrition Nursing Diagnoses: Imbalanced nutrition Impaired glucose control: actual or potential Goals: Patient/caregiver agrees to and verbalizes understanding of need to use nutritional supplements and/or vitamins as prescribed Date Initiated: 10/16/2015 Goal Status: Active Patient/caregiver verbalizes understanding of need to maintain therapeutic glucose control per primary care physician Date Initiated: 10/16/2015 Goal Status: Active Interventions: Assess patient nutrition upon admission and as needed per policy Provide education on elevated blood sugars and impact on wound  healing Notes: [place], Dian R. ([id number]) Orientation to the Wound Care Program Nursing Diagnoses: Knowledge deficit related to the wound healing center program Goals: Patient/caregiver will verbalize understanding of the [place] Program Date Initiated: 10/16/2015 Goal Status: Active Interventions: Provide education on orientation to the wound center Notes: Pain, Acute or Chronic Nursing Diagnoses: Pain, acute or chronic: actual or potential Potential alteration in comfort, pain Goals: Patient will verbalize adequate pain control and receive pain control interventions during procedures as needed Date Initiated: 10/16/2015 Goal Status: Active Interventions: Assess comfort goal upon admission Complete pain assessment as per visit requirements Notes: Wound/Skin Impairment Nursing Diagnoses: Impaired tissue integrity Goals: Ulcer/skin breakdown will have a volume reduction of 30% by week 4 Date Initiated: 10/16/2015 Goal Status: Active Ulcer/skin breakdown will have a volume reduction of 50% by week 8 Date Initiated: 10/16/2015 Goal Status: Active Ulcer/skin breakdown will have a volume reduction of 80% by  week 12 Date Initiated: 10/16/2015 ROALD, LUKACS ([id number]) Goal Status: Active Interventions: Assess patient/caregiver ability to perform ulcer/skin care regimen upon admission and as needed Assess ulceration(s) every visit Notes: Electronic Signature(s) Signed: 12/18/2015 3:33:39 PM By: Gretta Cool, RN, BSN, Kim RN, BSN Entered By: Gretta Cool, RN, BSN, Kim on 12/15/2015 12:58:02 [place]min, [name] ([id number]) -------------------------------------------------------------------------------- Pain Assessment Details Patient Name: [name] Date of Service: 12/15/2015 12:45 PM Medical Record Number: [id number] Patient Account Number: [id number] Date of Birth/Sex: [birth year] (75 y.o. Male) Treating RN: Cornell Barman Primary Care Physician: Jani Gravel Other  Clinician: Referring Physician: Jani Gravel Treating Physician/Extender: Frann Rider in Treatment: 8 Active Problems Location of Pain Severity and Description of Pain Patient Has Paino No Site Locations With Dressing Change: No Pain Management and Medication Current Pain Management: Electronic Signature(s) Signed: 12/18/2015 3:33:39 PM By: Gretta Cool, RN, BSN, Kim RN, BSN Entered By: Gretta Cool, RN, BSN, Kim on 12/15/2015 12:45:30 [place], [name] ([id number]) -------------------------------------------------------------------------------- Patient/Caregiver Education Details Patient Name: [name] Date of Service: 12/15/2015 12:45 PM Medical Record Number: [id number] Patient Account Number: [id number] Date of Birth/Gender: 03-Apr-1940 (75 y.o. Male) Treating RN: Cornell Barman Primary Care Physician: Jani Gravel Other Clinician: Referring Physician: Jani Gravel Treating Physician/Extender: Frann Rider in Treatment: 8 Education Assessment Education Provided To: Patient and Caregiver Education Topics Provided Elevated Blood Sugar/ Impact on Healing: Handouts: Elevated Blood Sugars: How Do They Affect Wound Healing Methods: Explain/Verbal Responses: State content correctly Venous: Handouts: Controlling Swelling with Compression Stockings Methods: Explain/Verbal Responses: State content correctly Wound/Skin Impairment: Handouts: Caring for Your Ulcer Methods: Demonstration, Explain/Verbal Responses: State content correctly Electronic Signature(s) Signed: 12/18/2015 3:33:39 PM By: Gretta Cool, RN, BSN, Kim RN, BSN Entered By: Gretta Cool, RN, BSN, Kim on 12/15/2015 13:00:37 [place], [name] ([id number]) -------------------------------------------------------------------------------- Wound Assessment Details Patient Name: [name] Date of Service: 12/15/2015 12:45 PM Medical Record Number: [id number] Patient Account Number: [id number] Date of Birth/Sex: Apr 20, 1940 (75  y.o. Male) Treating RN: Cornell Barman Primary Care Physician: Jani Gravel Other Clinician: Referring Physician: Jani Gravel Treating Physician/Extender: Frann Rider in Treatment: 8 Wound Status Wound Number: 1 Primary Diabetic Wound/Ulcer of the Lower Etiology: Extremity Wound Location: Left Lower Leg - Anterior Wound Open Wounding Event: Gradually Appeared Status: Date Acquired: 09/21/2015 Comorbid Cataracts, Anemia, Hypertension, Weeks Of Treatment: 8 History: Type II Diabetes, Osteoarthritis Clustered Wound: No Photos Photo Uploaded By: Gretta Cool, RN, BSN, Kim on 12/15/2015 12:52:57 Wound Measurements Length: (cm) 0.4 Width: (cm) 0.5 Depth: (cm) 0.1 Area: (cm) 0.157 Volume: (cm) 0.016 % Reduction in Area: 93.7% % Reduction in Volume: 93.5% Epithelialization: Medium (34-66%) Tunneling: No Undermining: No Wound Description Classification: Grade 1 Wound Margin: Distinct, outline attached Exudate Amount: Medium Exudate Type: Serosanguineous Exudate Color: red, brown Foul Odor After Cleansing: No Wound Bed Granulation Amount: Large (67-100%) Exposed Structure Granulation Quality: Red Fascia Exposed: No Necrotic Amount: None Present (0%) Fat Layer Exposed: No Tendon Exposed: No Mazzocco, Denzal R. ([id number]) Muscle Exposed: No Joint Exposed: No Bone Exposed: No Limited to Skin Breakdown Periwound Skin Texture Texture Color No Abnormalities Noted: No No Abnormalities Noted: No Callus: No Atrophie Blanche: No Crepitus: No Cyanosis: No Excoriation: No Ecchymosis: No Fluctuance: No Erythema: No Friable: No Hemosiderin Staining: No Induration: No Mottled: No Localized Edema: No Pallor: No Rash: No Rubor: No Scarring: No Temperature / Pain Moisture Temperature: No Abnormality No Abnormalities Noted: No Tenderness on Palpation: Yes Dry / Scaly: No Maceration: No Moist: No Wound Preparation Ulcer Cleansing: Rinsed/Irrigated with Saline Topical  Anesthetic Applied:  None Treatment Notes Wound #1 (Left, Anterior Lower Leg) 1. Cleansed with: Clean wound with Normal Saline 2. Anesthetic Topical Lidocaine 4% cream to wound bed prior to debridement 4. Dressing Applied: Prisma Ag 5. Secondary Dressing Applied Bordered Foam Dressing Electronic Signature(s) Signed: 12/18/2015 3:33:39 PM By: Gretta Cool, RN, BSN, Kim RN, BSN Entered By: Gretta Cool, RN, BSN, Kim on 12/15/2015 12:50:24 Hunger, [name] ([id number]) -------------------------------------------------------------------------------- [place] Details Patient Name: [name] Date of Service: 12/15/2015 12:45 PM Medical Record Number: [id number] Patient Account Number: [id number] Date of Birth/Sex: [birth year] (75 y.o. Male) Treating RN: Cornell Barman Primary Care Physician: Jani Gravel Other Clinician: Referring Physician: Jani Gravel Treating Physician/Extender: Frann Rider in Treatment: 8 Vital Signs Time Taken: 12:45 Temperature (F): 97.8 Height (in): 70 Pulse (bpm): 66 Weight (lbs): 165.7 Respiratory Rate (breaths/min): 16 Body Mass Index (BMI): 23.8 Blood Pressure (mmHg): 148/72 Reference Range: 80 - 120 mg / dl Electronic Signature(s) Signed: 12/18/2015 3:33:39 PM By: Gretta Cool, RN, BSN, Kim RN, BSN Entered By: Gretta Cool, RN, BSN, Kim on 12/15/2015 12:46:06

## 2015-12-18 NOTE — Progress Notes (Signed)
Joshua, [name] ([id number]) Visit Report for 12/15/2015 Chief Complaint Document Details Patient Name: [name], [name] Date of Service: 12/15/2015 12:45 PM Medical Record Number: [id number] Patient Account Number: [id number] Date of Birth/Sex: [birth year] (75 y.o. Male) Treating RN: Montey Hora Primary Care Physician: Jani Gravel Other Clinician: Referring Physician: Jani Gravel Treating Physician/Extender: Frann Rider in Treatment: 8 Information Obtained from: Patient Chief Complaint Patients presents for treatment of an open diabetic ulcer left lower extremity with swelling, cellulitis and ulceration for about a month Electronic Signature(s) Signed: 12/15/2015 1:29:43 PM By: Christin Fudge MD, FACS Entered By: Christin Fudge on 12/15/2015 13:29:43 Kakos, [name] ([id number]) -------------------------------------------------------------------------------- HPI Details Patient Name: [name] Date of Service: 12/15/2015 12:45 PM Medical Record Number: [id number] Patient Account Number: [id number] Date of Birth/Sex: [birth year] (75 y.o. Male) Treating RN: Montey Hora Primary Care Physician: Jani Gravel Other Clinician: Referring Physician: Jani Gravel Treating Physician/Extender: Frann Rider in Treatment: 8 History of Present Illness Location: left lower extremity Quality: Patient reports experiencing a dull pain to affected area(s). Severity: Patient states wound are getting worse. Duration: Patient has had the wound for < 4 weeks prior to presenting for treatment Timing: Pain in wound is Intermittent (comes and goes Context: The wound appeared gradually over time Modifying Factors: Other treatment(s) tried include:hospital admission with IV antibiotics and workup Associated Signs and Symptoms: Patient reports having increase swelling. HPI Description: 75 year old gentleman who was been having his primary care taken care of by Dr. Maudie Mercury at [facility] has had recurrent cellulitis the left lower extremity and so far has had 3 attacks. First started in August of this year and was admitted to the ER and sent home with Keflex and. He saw his primary care doctor on August 9 and was referred to [facility] where they began intravenous antibiotics and was admitted to August 15. He at present on doxycycline 100 mg twice a day. 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 Never been a smoker. During the admission he had a ultrasound done to look for a abscess and none was found except for subcutaneous edema and there was no focal muscle fluid collection. Xray of the left lower extremity --IMPRESSION:Soft tissue swelling and stranding. No subcutaneous gas or acute osseous abnormality identified about the left tib-fib. oOf note there was a question of Coumadin induced skin necrosis and has been on Coumadin since the late 90's. Last hemoglobin A1c was 6.7. 10/26/2015 -- arterial and venous duplex studies were done but report is still pending. 11/02/2015 -- a lower extremity venous duplex reflux evaluation -- there was no evidence of DVT or SVT in the left lower extremity. 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 No further Vascular cosultation was recommended for this. Arterial studies done showed he had noncompressible vessels bilaterally and hence ABI was not reliable. The right toe brachial index was 0.56 which was abnormal and the left was 0.81 which was normal. Doppler signals  were triphasic bilaterally both posterior tibial and dorsalis pedis. 11/24/15: f/u today. no new complaints or concerns. easily palpable peripheral pulses bilaterally. there is development of hypergranulated tissue  within the wound bed. denies fever, chills or body aches. KENDON, SEDENO ([id number]) 12/01/15: returns to clinic today without new wounds or skin breakdown. he reports wound is looking better. denies fever, chills, body aches or malaise. Electronic Signature(s) Signed: 12/15/2015 1:30:01 PM By: Christin Fudge MD, FACS Entered By: Christin Fudge on 12/15/2015 13:30:01 ANUP, BRIGHAM ([id number]) -------------------------------------------------------------------------------- Physical Exam Details Patient Name: [name] Date of Service: 12/15/2015 12:45 PM Medical Record Number: [id number] Patient Account Number: [id number] Date of Birth/Sex: [birth year] (75 y.o. Male) Treating RN: Montey Hora Primary Care Physician: Jani Gravel Other Clinician: Referring Physician: Jani Gravel Treating Physician/Extender: Frann Rider in Treatment: 8 Constitutional . Pulse regular. Respirations normal and unlabored. Afebrile. . Eyes Nonicteric. Reactive to light. Ears, Nose, Mouth, and Throat Lips, teeth, and gums WNL.[name] Moist mucosa without lesions. Neck supple and nontender. No palpable supraclavicular or cervical adenopathy. Normal sized without goiter. Respiratory WNL. No retractions.. Breath sounds WNL, No rubs, rales, rhonchi, or wheeze.. Cardiovascular Heart rhythm and rate regular, no murmur or gallop.. Pedal Pulses WNL. No clubbing, cyanosis or edema. Lymphatic No adneopathy. No adenopathy. No adenopathy. Musculoskeletal Adexa without tenderness or enlargement.. Digits and nails w/o clubbing, cyanosis, infection, petechiae, ischemia, or inflammatory conditions.. Integumentary (Hair, Skin) No suspicious lesions. No crepitus or fluctuance. No peri-wound warmth  or erythema. No masses.[name] Psychiatric Judgement and insight Intact.. No evidence of depression, anxiety, or agitation.. Notes wound looks excellent and except for a few millimeters in the center everything else is healing nicely. Electronic Signature(s) Signed: 12/15/2015 1:30:55 PM By: Christin Fudge MD, FACS Entered By: Christin Fudge on 12/15/2015 13:30:55 Macaulay, [name] ([id number]) -------------------------------------------------------------------------------- Physician Orders Details Patient Name: [name] Date of Service: 12/15/2015 12:45 PM Medical Record Number: [id number] Patient Account Number: [id number] Date of Birth/Sex: 1940/10/17 (75 y.o. Male) Treating RN: Cornell Barman Primary Care Physician: Jani Gravel Other Clinician: Referring Physician: Jani Gravel Treating Physician/Extender: Frann Rider in Treatment: 8 Verbal / Phone Orders: Yes Clinician: Cornell Barman Read Back and Verified: Yes Diagnosis Coding Wound Cleansing Wound #1 Left,Anterior Lower Leg o Clean wound with wound cleanser. o Cleanse wound with mild soap and water o May Shower, gently pat wound dry prior to applying new dressing. Anesthetic Wound #1 Left,Anterior Lower Leg o Topical Lidocaine 4% cream applied to wound bed prior to debridement Skin Barriers/Peri-Wound Care Wound #1 Left,Anterior Lower Leg o Skin Prep Primary Wound Dressing Wound #1 Left,Anterior Lower Leg o Prisma Ag Secondary Dressing Wound #1 Left,Anterior Lower Leg o Boardered Foam Dressing - telfa island Dressing Change Frequency Wound #1 Left,Anterior Lower Leg o Three times weekly Follow-up Appointments Wound #1 Left,Anterior Lower Leg o Return Appointment in 1 week. Edema Control Wound #1 Left,Anterior Lower Leg o Elevate legs to the level of the heart and pump ankles as often as possible o Other: - wear compression stockings Vendetti, Leona R. ([id number]) Additional Orders /  Instructions Wound #1 Left,Anterior Lower Leg o Increase protein intake. Home Health Wound #1 Left,Anterior Lower Leg o D/C Home Health Services Medications-please add to medication list. Wound #1 Left,Anterior Lower Leg o Other: - Vitamin A, Vitamin C, Zinc Electronic Signature(s) Signed: 12/15/2015 4:11:36 PM By: Christin Fudge MD, FACS Signed: 12/18/2015 3:33:39 PM By: Gretta Cool RN, BSN, Kim RN, BSN Entered By: Gretta Cool, RN, BSN, Kim on 12/15/2015 12:58:40 Abbs, [name] ([id number]) -------------------------------------------------------------------------------- Problem List Details Patient Name: [name] Date of Service: 12/15/2015 12:45 PM Medical Record Number: [id number] Patient Account Number: [id number] Date of Birth/Sex:  [birth year] (75 y.o. 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 Male) Treating RN: Montey Hora Primary Care Physician: Jani Gravel Other Clinician: Referring Physician: Jani Gravel Treating Physician/Extender: Frann Rider in Treatment:  8 Subjective Chief Complaint Information obtained from Patient Patients presents for treatment of an open diabetic ulcer left lower extremity with swelling, cellulitis and ulceration for about a month History of Present Illness (HPI) The following HPI elements were documented for the patient's wound: Location: left lower extremity Quality: Patient reports experiencing a dull pain to affected area(s). Severity: Patient states wound are getting worse. Duration: Patient has had the wound for < 4 weeks prior to presenting for treatment Timing: Pain in wound is Intermittent (comes and goes Context: The wound appeared gradually over time Modifying Factors: Other treatment(s) tried include:hospital admission with IV antibiotics and workup Associated Signs and Symptoms: Patient reports having increase swelling. 75 year old gentleman who was been having his primary care taken care of by Dr. Maudie Mercury at [facility] has had recurrent cellulitis the left lower extremity and so far has had 3 attacks. First started in August of this year and was admitted to the ER and sent home with Keflex and. He saw his primary care doctor on August 9 and was referred to [facility] where they began intravenous antibiotics and was admitted to August 15. He at present on doxycycline 100 mg twice a day. 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 Never been a smoker. During the admission he had a ultrasound done to look for a abscess and none was found except  for subcutaneous edema and there was no focal muscle fluid collection. Xray of the left lower extremity --IMPRESSION:Soft tissue swelling and stranding. No subcutaneous gas or acute osseous abnormality identified about the left tib-fib. Of note there was a question of Coumadin induced skin necrosis and has been on Coumadin since the late 90's. Last hemoglobin A1c was 6.7. 10/26/2015 -- arterial and venous duplex studies were done but report is still pending. 11/02/2015 -- a lower extremity venous duplex reflux evaluation -- there was no evidence of DVT or SVT in Buhman, Forrest R. ([id number]) the left lower extremity. 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 No further Vascular cosultation was recommended for this. Arterial studies done showed he had noncompressible vessels bilaterally and hence ABI was not reliable. The right toe brachial index was 0.56 which was abnormal and the left was 0.81 which was normal. Doppler signals were triphasic bilaterally  both posterior tibial and dorsalis pedis. 11/24/15: f/u today. no new complaints or concerns. easily palpable peripheral pulses bilaterally. there is development of hypergranulated tissue within the wound bed. denies fever, chills or body aches. 12/01/15: returns to clinic today without new wounds or skin breakdown. he reports wound is looking better. denies fever, chills, body aches or malaise. Objective Constitutional Pulse regular. Respirations normal and unlabored. Afebrile. Vitals Time Taken: 12:45 PM, Height: 70 in, Weight: 165.7 lbs, BMI: 23.8, Temperature: 97.8 F, Pulse: 66 bpm, Respiratory Rate: 16 breaths/min, Blood Pressure: 148/72 mmHg. Eyes Nonicteric. Reactive to light. Ears, Nose, Mouth, and Throat Lips, teeth, and gums WNL.[name] Moist mucosa without lesions. Neck supple and nontender. No palpable supraclavicular or cervical adenopathy. Normal sized without goiter. Respiratory WNL. No retractions.. Breath  sounds WNL, No rubs, rales, rhonchi, or wheeze.. Cardiovascular Heart rhythm and rate regular, no murmur or gallop.. Pedal Pulses WNL. No clubbing, cyanosis or edema. Lymphatic No adneopathy. No adenopathy. No adenopathy. Musculoskeletal Adexa without tenderness or enlargement.. Digits and nails w/o clubbing, cyanosis, infection, petechiae, ischemia, or inflammatory conditions.[name] Psychiatric Judgement and insight Intact.. No evidence of depression, anxiety, or agitation.[name] Hanneman, Jann R. ([id number]) General Notes: wound looks excellent and except for a few millimeters in the center everything else is healing nicely. Integumentary (Hair, Skin) No suspicious lesions. No crepitus or fluctuance. No peri-wound warmth or erythema. No masses.. Wound #1 status is Open. Original cause of wound was Gradually Appeared. The wound is located on the Left,Anterior Lower Leg. The wound measures 0.4cm length x 0.5cm width x 0.1cm depth; 0.157cm^2 area and 0.016cm^3 volume. The wound is limited to skin breakdown. There is no tunneling or undermining noted. There is a medium amount of serosanguineous drainage noted. The wound margin is distinct with the outline attached to the wound base. There is large (67-100%) red granulation within the wound bed. There is no necrotic tissue within the wound bed. The periwound skin appearance did not exhibit: Callus, Crepitus, Excoriation, Fluctuance, Friable, Induration, Localized Edema, Rash, Scarring, Dry/Scaly, Maceration, Moist, Atrophie Blanche, Cyanosis, Ecchymosis, Hemosiderin Staining, Mottled, Pallor, Rubor, Erythema. Periwound temperature was noted as No Abnormality. The periwound has tenderness on palpation. Assessment Active Problems ICD-10 E11.622 - Type 2 diabetes mellitus with other skin ulcer L97.222 - Non-pressure chronic ulcer of left calf with fat layer exposed I89.0 - Lymphedema, not elsewhere classified Plan Wound Cleansing: Wound #1  Left,Anterior Lower Leg: Clean wound with wound cleanser. Cleanse wound with mild soap and water May Shower, gently pat wound dry prior to applying new dressing. Anesthetic: Wound #1 Left,Anterior Lower Leg: Topical Lidocaine 4% cream applied to wound bed prior to debridement Skin Barriers/Peri-Wound Care: Wound #1 Left,Anterior Lower Leg: Skin Prep Primary Wound Dressing: Wound #1 Left,Anterior Lower Leg: Deskin, Tomoki R. ([id number]) Prisma Ag Secondary Dressing: Wound #1 Left,Anterior Lower Leg: Boardered Foam Dressing - telfa island Dressing Change Frequency: Wound #1 Left,Anterior Lower Leg: Three times weekly Follow-up Appointments: Wound #1 Left,Anterior Lower Leg: Return Appointment in 1 week. Edema Control: Wound #1 Left,Anterior Lower Leg: Elevate legs to the level of the heart and pump ankles as often as possible Other: - wear compression stockings Additional Orders / Instructions: Wound #1 Left,Anterior Lower Leg: Increase protein intake. Home Health: Wound #1 Left,Anterior Lower Leg: D/C Home Health Services Medications-please add to medication list.: Wound #1 Left,Anterior Lower Leg: Other: - Vitamin A, Vitamin C, Zinc I have recommended: 1. to use Prisma Ag and a bordered foam, to be changed every other day 2. light  compression stockings 20-30 mm of Hg to be worn all day 3. protein, vitamin A, vitamin C and zinc 4. regular visits to the wound center He and his wife have had all QUESTIONS answered and will be compliant Electronic Signature(s) Signed: 12/15/2015 1:31:51 PM By: Christin Fudge MD, FACS Entered By: Christin Fudge on 12/15/2015 13:31:51 Gassman, [name] ([id number]) -------------------------------------------------------------------------------- SuperBill Details Patient Name: [name] Date of Service: 12/15/2015 Medical Record Number: [id number] Patient Account Number: [id number] Date of Birth/Sex: 23-Nov-1940 (75 y.o.  Male) Treating RN: Montey Hora Primary Care Physician: Jani Gravel Other Clinician: Referring Physician: Jani Gravel Treating Physician/Extender: Frann Rider in Treatment: 8 Diagnosis Coding ICD-10 Codes Code Description [id number] Type 2 diabetes mellitus with other skin ulcer L97.222 Non-pressure chronic ulcer of left calf with fat layer exposed I89.0 Lymphedema, not elsewhere classified Facility Procedures CPT4 Code: [id number] Description: [id number] - WOUND CARE VISIT-LEV 2 EST PT Modifier: Quantity: 1 Physician Procedures CPT4 Code: 5993570 Description: 17793 - WC PHYS LEVEL 3 - EST PT ICD-10 Description Diagnosis E11.622 Type 2 diabetes mellitus with other skin ulcer L97.222 Non-pressure chronic ulcer of left calf with fat I89.0 Lymphedema, not elsewhere classified Modifier: layer exposed Quantity: 1 Electronic Signature(s) Signed: 12/15/2015 1:32:02 PM By: Christin Fudge MD, FACS Entered By: Christin Fudge on 12/15/2015 13:32:02

## 2015-12-21 ENCOUNTER — Encounter: Payer: Medicare Other | Attending: Surgery | Admitting: Surgery

## 2015-12-21 DIAGNOSIS — L97222 Non-pressure chronic ulcer of left calf with fat layer exposed: Secondary | ICD-10-CM | POA: Diagnosis not present

## 2015-12-21 DIAGNOSIS — I890 Lymphedema, not elsewhere classified: Secondary | ICD-10-CM | POA: Insufficient documentation

## 2015-12-21 DIAGNOSIS — M1990 Unspecified osteoarthritis, unspecified site: Secondary | ICD-10-CM | POA: Diagnosis not present

## 2015-12-21 DIAGNOSIS — E11622 Type 2 diabetes mellitus with other skin ulcer: Secondary | ICD-10-CM | POA: Diagnosis not present

## 2015-12-21 DIAGNOSIS — I10 Essential (primary) hypertension: Secondary | ICD-10-CM | POA: Diagnosis not present

## 2015-12-21 DIAGNOSIS — E785 Hyperlipidemia, unspecified: Secondary | ICD-10-CM | POA: Insufficient documentation

## 2015-12-21 DIAGNOSIS — Z7984 Long term (current) use of oral hypoglycemic drugs: Secondary | ICD-10-CM | POA: Insufficient documentation

## 2015-12-21 DIAGNOSIS — D649 Anemia, unspecified: Secondary | ICD-10-CM | POA: Insufficient documentation

## 2015-12-21 DIAGNOSIS — I6523 Occlusion and stenosis of bilateral carotid arteries: Secondary | ICD-10-CM | POA: Diagnosis not present

## 2015-12-21 DIAGNOSIS — Z7901 Long term (current) use of anticoagulants: Secondary | ICD-10-CM | POA: Insufficient documentation

## 2015-12-21 DIAGNOSIS — Z8673 Personal history of transient ischemic attack (TIA), and cerebral infarction without residual deficits: Secondary | ICD-10-CM | POA: Insufficient documentation

## 2015-12-21 DIAGNOSIS — Z79899 Other long term (current) drug therapy: Secondary | ICD-10-CM | POA: Insufficient documentation

## 2015-12-21 DIAGNOSIS — Z86718 Personal history of other venous thrombosis and embolism: Secondary | ICD-10-CM | POA: Insufficient documentation

## 2015-12-21 DIAGNOSIS — D693 Immune thrombocytopenic purpura: Secondary | ICD-10-CM | POA: Diagnosis not present

## 2015-12-21 DIAGNOSIS — Z4800 Encounter for change or removal of nonsurgical wound dressing: Secondary | ICD-10-CM | POA: Diagnosis not present

## 2015-12-21 DIAGNOSIS — K7460 Unspecified cirrhosis of liver: Secondary | ICD-10-CM | POA: Diagnosis not present

## 2015-12-21 DIAGNOSIS — Z79891 Long term (current) use of opiate analgesic: Secondary | ICD-10-CM | POA: Diagnosis not present

## 2015-12-21 DIAGNOSIS — Z09 Encounter for follow-up examination after completed treatment for conditions other than malignant neoplasm: Secondary | ICD-10-CM | POA: Diagnosis not present

## 2015-12-21 DIAGNOSIS — Z872 Personal history of diseases of the skin and subcutaneous tissue: Secondary | ICD-10-CM | POA: Diagnosis not present

## 2015-12-22 NOTE — Progress Notes (Signed)
MARIAH, HARN ([id number]) Visit Report for 12/21/2015 Arrival Information Details Patient Name: [name], [name] Date of Service: 12/21/2015 3:45 PM Medical Record Number: [id number] Patient Account Number: [id number] Date of Birth/Sex: 1940-03-12 (75 y.o. Male) Treating RN: Baruch Gouty, RN, BSN, Velva Harman Primary Care Physician: Jani Gravel Other Clinician: Referring Physician: Jani Gravel Treating Physician/Extender: Frann Rider in Treatment: 9 Visit Information History Since Last Visit All ordered tests and consults were completed: No Patient Arrived: Ambulatory Added or deleted any medications: No Arrival Time: 15:43 Any new allergies or adverse reactions: No Accompanied By: wife Had a fall or experienced change in No Transfer Assistance: None activities of daily living that may affect Patient Requires No risk of falls: Transmission-Based Signs or symptoms of abuse/neglect since last No Precautions: visito Patient Has Alerts: Yes Hospitalized since last visit: No Patient Alerts: Patient on Blood Pain Present Now: No Thinner Warfarin DM II ABIs noncompressible Electronic Signature(s) Signed: 12/21/2015 5:20:58 PM By: Regan Lemming BSN, RN Entered By: Regan Lemming on 12/21/2015 15:44:10 [name], [name] ([id number]) -------------------------------------------------------------------------------- Clinic Level of Care Assessment Details Patient Name: [name] Date of Service: 12/21/2015 3:45 PM Medical Record Number: [id number] Patient Account Number: [id number] Date of Birth/Sex: 01-21-1941 (75 y.o. Male) Treating RN: Baruch Gouty, RN, BSN, [place] Primary Care Physician: Jani Gravel Other Clinician: Referring Physician: Jani Gravel Treating Physician/Extender: Frann Rider in Treatment: 9 Clinic Level of Care Assessment Items TOOL 4 Quantity Score []  - Use when only an EandM is performed on FOLLOW-UP visit 0 ASSESSMENTS - Nursing Assessment / Reassessment X -  Reassessment of Co-morbidities (includes updates in patient status) 1 10 X - Reassessment of Adherence to Treatment Plan 1 5 ASSESSMENTS - Wound and Skin Assessment / Reassessment []  - Simple Wound Assessment / Reassessment - one wound 0 []  - Complex Wound Assessment / Reassessment - multiple wounds 0 []  - Dermatologic / Skin Assessment (not related to wound area) 0 ASSESSMENTS - Focused Assessment []  - Circumferential Edema Measurements - multi extremities 0 []  - Nutritional Assessment / Counseling / Intervention 0 X - Lower Extremity Assessment (monofilament, tuning fork, pulses) 1 5 []  - Peripheral Arterial Disease Assessment (using hand held doppler) 0 ASSESSMENTS - Ostomy and/or Continence Assessment and Care []  - Incontinence Assessment and Management 0 []  - Ostomy Care Assessment and Management (repouching, etc.) 0 PROCESS - Coordination of Care X - Simple Patient / Family Education for ongoing care 1 15 []  - Complex (extensive) Patient / Family Education for ongoing care 0 []  - Staff obtains Programmer, systems, Records, Test Results / Process Orders 0 []  - Staff telephones HHA, Nursing Homes / Clarify orders / etc 0 []  - Routine Transfer to another Facility (non-emergent condition) 0 [name], [name] ([id number]) []  - Routine Hospital Admission (non-emergent condition) 0 []  - New Admissions / Biomedical engineer / Ordering NPWT, Apligraf, etc. 0 []  - Emergency Hospital Admission (emergent condition) 0 []  - Simple Discharge Coordination 0 []  - Complex (extensive) Discharge Coordination 0 PROCESS - Special Needs []  - Pediatric / Minor Patient Management 0 []  - Isolation Patient Management 0 []  - Hearing / Language / Visual special needs 0 []  - Assessment of Community assistance (transportation, D/C planning, etc.) 0 []  - Additional assistance / Altered mentation 0 []  - Support Surface(s) Assessment (bed, cushion, seat, etc.) 0 INTERVENTIONS - Wound Cleansing / Measurement []  -  Simple Wound Cleansing - one wound 0 []  - Complex Wound Cleansing - multiple wounds 0 X - Wound Imaging (photographs - any number  of wounds) 1 5 []  - Wound Tracing (instead of photographs) 0 []  - Simple Wound Measurement - one wound 0 []  - Complex Wound Measurement - multiple wounds 0 INTERVENTIONS - Wound Dressings []  - Small Wound Dressing one or multiple wounds 0 []  - Medium Wound Dressing one or multiple wounds 0 []  - Large Wound Dressing one or multiple wounds 0 []  - Application of Medications - topical 0 []  - Application of Medications - injection 0 INTERVENTIONS - Miscellaneous []  - External ear exam 0 [name], [name] ([id number]) []  - Specimen Collection (cultures, biopsies, blood, body fluids, etc.) 0 []  - Specimen(s) / Culture(s) sent or taken to Lab for analysis 0 []  - Patient Transfer (multiple staff / [name] / Similar devices) 0 []  - Simple Staple / Suture removal (25 or less) 0 []  - Complex Staple / Suture removal (26 or more) 0 []  - Hypo / Hyperglycemic Management (close monitor of Blood Glucose) 0 []  - Ankle / Brachial Index (ABI) - do not check if billed separately 0 X - Vital Signs 1 5 Has the patient been seen at the hospital within the last three years: Yes Total Score: 45 Level Of Care: New/Established - Level 2 Electronic Signature(s) Signed: 12/21/2015 5:20:58 PM By: Regan Lemming BSN, RN Entered By: Regan Lemming on 12/21/2015 [name], [name] ([id number]) -------------------------------------------------------------------------------- Encounter Discharge Information Details Patient Name: [name] Date of Service: 12/21/2015 3:45 PM Medical Record Number: [id number] Patient Account Number: [id number] Date of Birth/Sex: 1940-07-29 (75 y.o. Male) Treating RN: Ahmed Prima Primary Care Physician: Jani Gravel Other Clinician: Referring Physician: Jani Gravel Treating Physician/Extender: Frann Rider in Treatment: 9 Encounter  Discharge Information Items Discharge Pain Level: 0 Discharge Condition: Stable Ambulatory Status: Ambulatory Discharge Destination: Home Transportation: Private Auto Accompanied By: wife Schedule Follow-up Appointment: No Medication Reconciliation completed and provided to Patient/Care Yes Jurell Basista: Provided on Clinical Summary of Care: 12/21/2015 Form Type Recipient Paper Patient BP Electronic Signature(s) Signed: 12/21/2015 4:07:01 PM By: Ruthine Dose Entered By: Ruthine Dose on 12/21/2015 16:07:01 [name], [name] Kitchen ([id number]) -------------------------------------------------------------------------------- Lower Extremity Assessment Details Patient Name: [name] Date of Service: 12/21/2015 3:45 PM Medical Record Number: [id number] Patient Account Number: [id number] Date of Birth/Sex: 10/20/1940 (75 y.o. Male) Treating RN: Baruch Gouty, RN, BSN, Velva Harman Primary Care Physician: Jani Gravel Other Clinician: Referring Physician: Jani Gravel Treating Physician/Extender: Frann Rider in Treatment: 9 Vascular Assessment Pulses: Posterior Tibial Dorsalis Pedis Palpable: [Left:Yes] Extremity colors, hair growth, and conditions: Extremity Color: [Left:Normal] Temperature of Extremity: [Left:Warm] Capillary Refill: [Left:< 3 seconds] Toe Nail Assessment Left: Right: Thick: No Discolored: No Deformed: No Improper Length and Hygiene: No Electronic Signature(s) Signed: 12/21/2015 5:20:58 PM By: Regan Lemming BSN, RN Entered By: Regan Lemming on 12/21/2015 15:46:30 [name], [name] Kitchen ([id number]) -------------------------------------------------------------------------------- Multi Wound Chart Details Patient Name: [name] Date of Service: 12/21/2015 3:45 PM Medical Record Number: [id number] Patient Account Number: [id number] Date of Birth/Sex: April 20, 1940 (75 y.o. Male) Treating RN: Baruch Gouty, RN, BSN, Velva Harman Primary Care Physician: Jani Gravel Other Clinician: Referring  Physician: Jani Gravel Treating Physician/Extender: Frann Rider in Treatment: 9 Vital Signs Height(in): 70 Pulse(bpm): 64 Weight(lbs): 165.7 Blood Pressure 131/55 (mmHg): Body Mass Index(BMI): 24 Temperature(F): 97.9 Respiratory Rate 16 (breaths/min): Photos: [1:No Photos] [N/A:N/A] Wound Location: [1:Left Lower Leg - Anterior] [N/A:N/A] Wounding Event: [1:Gradually Appeared] [N/A:N/A] Primary Etiology: [1:Diabetic Wound/Ulcer of the Lower Extremity] [N/A:N/A] Comorbid History: [1:Cataracts, Anemia, Hypertension, Type II Diabetes, Osteoarthritis] [N/A:N/A] Date Acquired: [1:09/21/2015] [N/A:N/A] Weeks of Treatment: [1:9] [N/A:N/A]  Wound Status: [1:Healed - Epithelialized] [N/A:N/A] Measurements L x W x D 0x0x0 [N/A:N/A] (cm) Area (cm) : [1:0] [N/A:N/A] Volume (cm) : [1:0] [N/A:N/A] % Reduction in Area: [1:100.00%] [N/A:N/A] % Reduction in Volume: 100.00% [N/A:N/A] Classification: [1:Grade 1] [N/A:N/A] Exudate Amount: [1:None Present] [N/A:N/A] Wound Margin: [1:Distinct, outline attached] [N/A:N/A] Granulation Amount: [1:None Present (0%)] [N/A:N/A] Necrotic Amount: [1:None Present (0%)] [N/A:N/A] Exposed Structures: [1:Fascia: No Fat: No Tendon: No Muscle: No Joint: No Bone: No Limited to Skin Breakdown] [N/A:N/A] Epithelialization: Large (67-100%) N/A N/A Periwound Skin Texture: Edema: No N/A N/A Excoriation: No Induration: No Callus: No Crepitus: No Fluctuance: No Friable: No Rash: No Scarring: No Periwound Skin Dry/Scaly: Yes N/A N/A Moisture: Maceration: No Moist: No Periwound Skin Color: Atrophie Blanche: No N/A N/A Cyanosis: No Ecchymosis: No Erythema: No Hemosiderin Staining: No Mottled: No Pallor: No Rubor: No Temperature: No Abnormality N/A N/A Tenderness on No N/A N/A Palpation: Wound Preparation: Ulcer Cleansing: N/A N/A Rinsed/Irrigated with Saline Topical Anesthetic Applied: None Treatment Notes Electronic  Signature(s) Signed: 12/21/2015 4:04:07 PM By: Regan Lemming BSN, RN Entered By: Regan Lemming on 12/21/2015 16:04:07 [name], [name] ([id number]) -------------------------------------------------------------------------------- [place] Details Patient Name: [name] Date of Service: 12/21/2015 3:45 PM Medical Record Number: [id number] Patient Account Number: [id number] Date of Birth/Sex: [birth year] (75 y.o. Male) Treating RN: Baruch Gouty, RN, BSN, [place] Primary Care Physician: Jani Gravel Other Clinician: Referring Physician: Jani Gravel Treating Physician/Extender: Frann Rider in Treatment: 9 Active Inactive Electronic Signature(s) Signed: 12/21/2015 4:03:58 PM By: Regan Lemming BSN, RN Entered By: Regan Lemming on 12/21/2015 16:03:57 [name], [name] Kitchen ([id number]) -------------------------------------------------------------------------------- Pain Assessment Details Patient Name: [name] Date of Service: 12/21/2015 3:45 PM Medical Record Number: [id number] Patient Account Number: [id number] Date of Birth/Sex: 1940-10-29 (75 y.o. Male) Treating RN: Baruch Gouty, RN, BSN, [place] Primary Care Physician: Jani Gravel Other Clinician: Referring Physician: Jani Gravel Treating Physician/Extender: Frann Rider in Treatment: 9 Active Problems Location of Pain Severity and Description of Pain Patient Has Paino No Site Locations With Dressing Change: No Pain Management and Medication Current Pain Management: Electronic Signature(s) Signed: 12/21/2015 5:20:58 PM By: Regan Lemming BSN, RN Entered By: Regan Lemming on 12/21/2015 15:44:16 [name], [name] ([id number]) -------------------------------------------------------------------------------- Patient/Caregiver Education Details Patient Name: [name] Date of Service: 12/21/2015 3:45 PM Medical Record Number: [id number] Patient Account Number: [id number] Date of Birth/Gender: 08-16-1940 (75 y.o.  Male) Treating RN: Ahmed Prima Primary Care Physician: Jani Gravel Other Clinician: Referring Physician: Jani Gravel Treating Physician/Extender: Frann Rider in Treatment: 9 Education Assessment Education Provided To: Patient Education Topics Provided Wound/Skin Impairment: Handouts: Other: call oujr office if you have any questions or concerns. Methods: Explain/Verbal Responses: State content correctly Electronic Signature(s) Signed: 12/21/2015 4:59:43 PM By: Alric Quan Entered By: Alric Quan on 12/21/2015 16:05:26 [name], [name] Kitchen ([id number]) -------------------------------------------------------------------------------- Wound Assessment Details Patient Name: [name] Date of Service: 12/21/2015 3:45 PM Medical Record Number: [id number] Patient Account Number: [id number] Date of Birth/Sex: 1940-12-10 (75 y.o. Male) Treating RN: Baruch Gouty, RN, BSN, Velva Harman Primary Care Physician: Jani Gravel Other Clinician: Referring Physician: Jani Gravel Treating Physician/Extender: Frann Rider in Treatment: 9 Wound Status Wound Number: 1 Primary Diabetic Wound/Ulcer of the Lower Etiology: Extremity Wound Location: Left Lower Leg - Anterior Wound Healed - Epithelialized Wounding Event: Gradually Appeared Status: Date Acquired: 09/21/2015 Comorbid Cataracts, Anemia, Hypertension, Weeks Of Treatment: 9 History: Type II Diabetes, Osteoarthritis Clustered Wound: No Photos Photo Uploaded By: Alric Quan on 12/21/2015 17:14:30 Wound Measurements Length: (cm) 0 % Reductio  Width: (cm) 0 % Reductio Depth: (cm) 0 Epithelial Area: (cm) 0 Tunneling Volume: (cm) 0 Undermini n in Area: 100% n in Volume: 100% ization: Large (67-100%) : No ng: No Wound Description Classification: Grade 1 Wound Margin: Distinct, outline attached Exudate Amount: None Present Foul Odor After Cleansing: No Wound Bed Granulation Amount: None Present (0%) Exposed  Structure Necrotic Amount: None Present (0%) Fascia Exposed: No Fat Layer Exposed: No Tendon Exposed: No Muscle Exposed: No Joint Exposed: No Seay, Altair R. ([id number]) Bone Exposed: No Limited to Skin Breakdown Periwound Skin Texture Texture Color No Abnormalities Noted: No No Abnormalities Noted: No Callus: No Atrophie Blanche: No Crepitus: No Cyanosis: No Excoriation: No Ecchymosis: No Fluctuance: No Erythema: No Friable: No Hemosiderin Staining: No Induration: No Mottled: No Localized Edema: No Pallor: No Rash: No Rubor: No Scarring: No Temperature / Pain Moisture Temperature: No Abnormality No Abnormalities Noted: No Dry / Scaly: Yes Maceration: No Moist: No Wound Preparation Ulcer Cleansing: Rinsed/Irrigated with Saline Topical Anesthetic Applied: None Electronic Signature(s) Signed: 12/21/2015 5:20:58 PM By: Regan Lemming BSN, RN Entered By: Regan Lemming on 12/21/2015 16:01:29 Shelden, [name] ([id number]) -------------------------------------------------------------------------------- Vitals Details Patient Name: [name] Date of Service: 12/21/2015 3:45 PM Medical Record Number: [id number] Patient Account Number: [id number] Date of Birth/Sex: 01-02-1941 (75 y.o. Male) Treating RN: Baruch Gouty, RN, BSN, [place] Primary Care Physician: Jani Gravel Other Clinician: Referring Physician: Jani Gravel Treating Physician/Extender: Frann Rider in Treatment: 9 Vital Signs Time Taken: 15:44 Temperature (F): 97.9 Height (in): 70 Pulse (bpm): 64 Weight (lbs): 165.7 Respiratory Rate (breaths/min): 16 Body Mass Index (BMI): 23.8 Blood Pressure (mmHg): 131/55 Reference Range: 80 - 120 mg / dl Electronic Signature(s) Signed: 12/21/2015 5:20:58 PM By: Regan Lemming BSN, RN Entered By: Regan Lemming on 12/21/2015 15:46:10

## 2015-12-22 NOTE — Progress Notes (Signed)
ODIN, MARIANI ([id number]) Visit Report for 12/21/2015 Chief Complaint Document Details Patient Name: [name], [name] Date of Service: 12/21/2015 3:45 PM Medical Record Number: [id number] Patient Account Number: [id number] Date of Birth/Sex: 04/11/1940 (75 y.o. Male) Treating RN: Ahmed Prima Primary Care Physician: Jani Gravel Other Clinician: Referring Physician: Jani Gravel Treating Physician/Extender: Frann Rider in Treatment: 9 Information Obtained from: Patient Chief Complaint Patients presents for treatment of an open diabetic ulcer left lower extremity with swelling, cellulitis and ulceration for about a month Electronic Signature(s) Signed: 12/21/2015 4:02:49 PM By: Christin Fudge MD, FACS Entered By: Christin Fudge on 12/21/2015 16:02:49 Mcadory, [name] ([id number]) -------------------------------------------------------------------------------- HPI Details Patient Name: [name] Date of Service: 12/21/2015 3:45 PM Medical Record Number: [id number] Patient Account Number: [id number] Date of Birth/Sex: [birth year] (75 y.o. Male) Treating RN: Ahmed Prima Primary Care Physician: Jani Gravel Other Clinician: Referring Physician: Jani Gravel Treating Physician/Extender: Frann Rider in Treatment: 9 History of Present Illness Location: left lower extremity Quality: Patient reports experiencing a dull pain to affected area(s). Severity: Patient states wound are getting worse. Duration: Patient has had the wound for < 4 weeks prior to presenting for treatment Timing: Pain in wound is Intermittent (comes and goes Context: The wound appeared gradually over time Modifying Factors: Other treatment(s) tried include:hospital admission with IV antibiotics and workup Associated Signs and Symptoms: Patient reports having increase swelling. HPI Description: 75 year old gentleman who was been having his primary care taken care of by Dr. Maudie Mercury at [facility] has had recurrent cellulitis the left lower extremity and so far has had 3 attacks. First started in August of this year and was admitted to the ER and sent home with Keflex and. He saw his primary care doctor on August 9 and was referred to [facility] where they began intravenous antibiotics and was admitted to August 15. He at present on doxycycline 100 mg twice a day. 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 Never been a smoker. During the admission he had a ultrasound done to look for a abscess and none was found except for subcutaneous edema and there was no focal muscle fluid collection. Xray of the left lower extremity --IMPRESSION:Soft tissue swelling and stranding. No subcutaneous gas or acute osseous abnormality identified about the left tib-fib. oOf note there was a question of Coumadin induced skin necrosis and has been on Coumadin since the late 90's. Last hemoglobin A1c was 6.7. 10/26/2015 -- arterial and venous duplex studies were done but report is still pending. 11/02/2015 -- a lower extremity venous duplex reflux evaluation -- there was no evidence of DVT or SVT in the left lower extremity. 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 No further Vascular cosultation was recommended for this. Arterial studies done showed he had noncompressible vessels bilaterally and hence ABI was not reliable. The right toe brachial index was 0.56 which was abnormal and the left was 0.81 which was normal. Doppler signals  were triphasic bilaterally both posterior tibial and dorsalis pedis. 11/24/15: f/u today. no new complaints or concerns. easily palpable peripheral pulses bilaterally. there is development of hypergranulated tissue  within the wound bed. denies fever, chills or body aches. LINLEY, MOXLEY ([id number]) 12/01/15: returns to clinic today without new wounds or skin breakdown. he reports wound is looking better. denies fever, chills, body aches or malaise. Electronic Signature(s) Signed: 12/21/2015 4:02:59 PM By: Christin Fudge MD, FACS Entered By: Christin Fudge on 12/21/2015 16:02:58 Wojtkiewicz, [name] ([id number]) -------------------------------------------------------------------------------- Physical Exam Details Patient Name: [name] Date of Service: 12/21/2015 3:45 PM Medical Record Number: [id number] Patient Account Number: [id number] Date of Birth/Sex: 04/25/1940 (75 y.o. Male) Treating RN: Ahmed Prima Primary Care Physician: Jani Gravel Other Clinician: Referring Physician: Jani Gravel Treating Physician/Extender: Frann Rider in Treatment: 9 Constitutional . Pulse regular. Respirations normal and unlabored. Afebrile. . Eyes Nonicteric. Reactive to light. Ears, Nose, Mouth, and Throat Lips, teeth, and gums WNL.[name] Moist mucosa without lesions. Neck supple and nontender. No palpable supraclavicular or cervical adenopathy. Normal sized without goiter. Respiratory WNL. No retractions.. Breath sounds WNL, No rubs, rales, rhonchi, or wheeze.. Cardiovascular Heart rhythm and rate regular, no murmur or gallop.. Pedal Pulses WNL. No clubbing, cyanosis or edema. Chest Breasts symmetical and no nipple discharge.. Breast tissue WNL, no masses, lumps, or tenderness.. Lymphatic No adneopathy. No adenopathy. No adenopathy. Musculoskeletal Adexa without tenderness or enlargement.. Digits and nails w/o clubbing, cyanosis, infection, petechiae, ischemia, or inflammatory  conditions.. Integumentary (Hair, Skin) No suspicious lesions. No crepitus or fluctuance. No peri-wound warmth or erythema. No masses.[name] Psychiatric Judgement and insight Intact.. No evidence of depression, anxiety, or agitation.. Notes the wound is completely healed. Electronic Signature(s) Signed: 12/21/2015 4:03:16 PM By: Christin Fudge MD, FACS Entered By: Christin Fudge on 12/21/2015 16:03:15 [place], [name] ([id number]) -------------------------------------------------------------------------------- Physician Orders Details Patient Name: [name] Date of Service: 12/21/2015 3:45 PM Medical Record Number: [id number] Patient Account Number: [id number] Date of Birth/Sex: [birth year] (75 y.o. Male) Treating RN: Baruch Gouty, RN, BSN, Velva Harman Primary Care Physician: Jani Gravel Other Clinician: Referring Physician: Jani Gravel Treating Physician/Extender: Frann Rider in Treatment: 9 Verbal / Phone Orders: Yes Clinician: Afful, RN, BSN, Rita Read Back and Verified: Yes Diagnosis Coding Edema Control o Patient to wear own compression stockings o Elevate legs to the level of the heart and pump ankles as often as possible Discharge From [facility] o Discharge from [place] Signature(s) Signed: 12/21/2015 4:11:15 PM By: Christin Fudge MD, FACS Signed: 12/21/2015 5:20:58 PM By: Regan Lemming BSN, RN Entered By: Regan Lemming on 12/21/2015 16:00:51 Heine, [name] ([id number]) -------------------------------------------------------------------------------- Problem List Details Patient Name: [name] Date of Service: 12/21/2015 3:45 PM Medical Record Number: [id number] Patient Account Number: [id number] Date of Birth/Sex: [birth year] (75 y.o. Male) Treating RN: Ahmed Prima Primary Care Physician: Jani Gravel Other Clinician: Referring Physician: Jani Gravel Treating Physician/Extender: Frann Rider in Treatment:  9 Active Problems ICD-10 Encounter Code Description Active Date Diagnosis E11.622 Type 2 diabetes mellitus with other skin ulcer 10/16/2015 Yes L97.222 Non-pressure chronic ulcer of left calf with fat layer 10/16/2015 Yes exposed I89.0 Lymphedema, not elsewhere classified 10/16/2015 Yes Inactive Problems Resolved Problems Electronic Signature(s) Signed: 12/21/2015 4:02:40 PM By: Christin Fudge MD, FACS Entered By: Christin Fudge on 12/21/2015 16:02:40 Gloss, [name] ([id number]) -------------------------------------------------------------------------------- Progress Note Details Patient Name: [name] Date of Service: 12/21/2015 3:45 PM Medical Record Number: [id number] Patient Account Number: [id number] Date of Birth/Sex: Feb 26, 1940 (75 y.o. Male) Treating RN: Ahmed Prima Primary Care Physician: Jani Gravel Other Clinician: Referring Physician: Jani Gravel Treating Physician/Extender: Frann Rider in Treatment: 9 Subjective  Chief Complaint Information obtained from Patient Patients presents for treatment of an open diabetic ulcer left lower extremity with swelling, cellulitis and ulceration for about a month History of Present Illness (HPI) The following HPI elements were documented for the patient's wound: Location: left lower extremity Quality: Patient reports experiencing a dull pain to affected area(s). Severity: Patient states wound are getting worse. Duration: Patient has had the wound for < 4 weeks prior to presenting for treatment Timing: Pain in wound is Intermittent (comes and goes Context: The wound appeared gradually over time Modifying Factors: Other treatment(s) tried include:hospital admission with IV antibiotics and workup Associated Signs and Symptoms: Patient reports having increase swelling. 75 year old gentleman who was been having his primary care taken care of by Dr. Maudie Mercury at [facility] has had recurrent cellulitis the  left lower extremity and so far has had 3 attacks. First started in August of this year and was admitted to the ER and sent home with Keflex and. He saw his primary care doctor on August 9 and was referred to [facility] where they began intravenous antibiotics and was admitted to August 15. He at present on doxycycline 100 mg twice a day. 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 Never been a smoker. During the admission he had a ultrasound done to look for a abscess and none was found except for subcutaneous edema and there was no focal muscle fluid collection. Xray of the left lower extremity --IMPRESSION:Soft tissue swelling and stranding. No subcutaneous gas or acute osseous abnormality identified about the left tib-fib. Of note there was a question of Coumadin induced skin necrosis and has been on Coumadin since the late 90's. Last hemoglobin A1c was 6.7. 10/26/2015 -- arterial and venous duplex studies were done but report is still pending. 11/02/2015 -- a lower extremity venous duplex reflux evaluation -- there was no evidence of DVT or SVT in Brugh, Humberto R. ([id number]) the left lower extremity. 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 No further Vascular cosultation was recommended for this. Arterial studies done showed he had noncompressible vessels bilaterally and hence ABI was not reliable. The right toe brachial index was 0.56 which was abnormal and the left was 0.81 which was normal. Doppler signals were triphasic  bilaterally both posterior tibial and dorsalis pedis. 11/24/15: f/u today. no new complaints or concerns. easily palpable peripheral pulses bilaterally. there is development of hypergranulated tissue within the wound bed. denies fever, chills or body aches. 12/01/15: returns to clinic today without new wounds or skin breakdown. he reports wound is looking better. denies fever, chills, body aches or malaise. Objective Constitutional Pulse regular. Respirations normal and unlabored. Afebrile. Vitals Time Taken: 3:44 PM, Height: 70 in, Weight: 165.7 lbs, BMI: 23.8, Temperature: 97.9 F, Pulse: 64 bpm, Respiratory Rate: 16 breaths/min, Blood Pressure: 131/55 mmHg. Eyes Nonicteric. Reactive to light. Ears, Nose, Mouth, and Throat Lips, teeth, and gums WNL.[name] Moist mucosa without lesions. Neck supple and nontender. No palpable supraclavicular or cervical adenopathy. Normal sized without goiter. Respiratory WNL. No retractions.. Breath sounds WNL, No rubs, rales, rhonchi, or wheeze.. Cardiovascular Heart rhythm and rate regular,  no murmur or gallop.. Pedal Pulses WNL. No clubbing, cyanosis or edema. Chest Breasts symmetical and no nipple discharge.. Breast tissue WNL, no masses, lumps, or tenderness.. Lymphatic No adneopathy. No adenopathy. No adenopathy. Musculoskeletal Adexa without tenderness or enlargement.. Digits and nails w/o clubbing, cyanosis, infection, petechiae, ischemia, or inflammatory conditions.[name] Leonor, Bradely R. ([id number]) Psychiatric Judgement and insight Intact.. No evidence of depression, anxiety, or agitation.. General Notes: the wound is completely healed. Integumentary (Hair, Skin) No suspicious lesions. No crepitus or fluctuance. No peri-wound warmth or erythema. No masses.. Wound #1 status is Healed - Epithelialized. Original cause of wound was Gradually Appeared. The wound is located on the Left,Anterior Lower Leg. The wound measures 0cm length x 0cm width x 0cm  depth; 0cm^2 area and 0cm^3 volume. The wound is limited to skin breakdown. There is no tunneling or undermining noted. There is a none present amount of drainage noted. The wound margin is distinct with the outline attached to the wound base. There is no granulation within the wound bed. There is no necrotic tissue within the wound bed. The periwound skin appearance exhibited: Dry/Scaly. The periwound skin appearance did not exhibit: Callus, Crepitus, Excoriation, Fluctuance, Friable, Induration, Localized Edema, Rash, Scarring, Maceration, Moist, Atrophie Blanche, Cyanosis, Ecchymosis, Hemosiderin Staining, Mottled, Pallor, Rubor, Erythema. Periwound temperature was noted as No Abnormality. Assessment Active Problems ICD-10 E11.622 - Type 2 diabetes mellitus with other skin ulcer L97.222 - Non-pressure chronic ulcer of left calf with fat layer exposed I89.0 - Lymphedema, not elsewhere classified Plan Edema Control: Patient to wear own compression stockings Elevate legs to the level of the heart and pump ankles as often as possible Discharge From [facility] Services: Discharge from [place] ([id number]) Thewound is completely healed and I have asked him to use a bordered foam for a few days and observe carefully to see if there is any drainage. He will continue to wear his compression stockings and we will discuss this in detail He is discharged on the wound care services and will be seen back only if needed Electronic Signature(s) Signed: 12/21/2015 4:04:23 PM By: Christin Fudge MD, FACS Entered By: Christin Fudge on 12/21/2015 16:04:23 [place], [name] ([id number]) -------------------------------------------------------------------------------- SuperBill Details Patient Name: [name] Date of Service: 12/21/2015 Medical Record Number: [id number] Patient Account Number: [id number] Date of Birth/Sex: Jul 10, 1940 (75 y.o. Male) Treating  RN: Ahmed Prima Primary Care Physician: Jani Gravel Other Clinician: Referring Physician: Jani Gravel Treating Physician/Extender: Frann Rider in Treatment: 9 Diagnosis Coding ICD-10 Codes Code Description E11.622 Type 2 diabetes mellitus with other skin ulcer L97.222 Non-pressure chronic ulcer of left calf with fat layer exposed I89.0 Lymphedema, not elsewhere classified Facility Procedures CPT4 Code: [id number] Description: [id number] - WOUND CARE VISIT-LEV 2 EST PT Modifier: Quantity: 1 Physician Procedures CPT4 Code: [id number] Description: 93790 - WC PHYS LEVEL 2 - EST PT ICD-10 Description Diagnosis E11.622 Type 2 diabetes mellitus with other skin ulcer L97.222 Non-pressure chronic ulcer of left calf with fat I89.0 Lymphedema, not elsewhere classified Modifier: layer exposed Quantity: 1 Electronic Signature(s) Signed: 12/21/2015 4:04:36 PM By: Christin Fudge MD, FACS Entered By: Christin Fudge on 12/21/2015 16:04:36

## 2015-12-26 ENCOUNTER — Encounter: Payer: Self-pay | Admitting: Vascular Surgery

## 2015-12-27 ENCOUNTER — Ambulatory Visit (INDEPENDENT_AMBULATORY_CARE_PROVIDER_SITE_OTHER): Payer: Medicare Other | Admitting: Vascular Surgery

## 2015-12-27 ENCOUNTER — Ambulatory Visit (HOSPITAL_COMMUNITY)
Admission: RE | Admit: 2015-12-27 | Discharge: 2015-12-27 | Disposition: A | Discharge status: Home / Self Care | Payer: Medicare Other | Source: Ambulatory Visit | Attending: Vascular Surgery | Admitting: Vascular Surgery

## 2015-12-27 ENCOUNTER — Encounter: Payer: Self-pay | Admitting: Vascular Surgery

## 2015-12-27 VITALS — BP 136/62 | HR 58 | Temp 97.5°F | Resp 16 | Ht 70.0 in | Wt 167.0 lb

## 2015-12-27 DIAGNOSIS — I6523 Occlusion and stenosis of bilateral carotid arteries: Secondary | ICD-10-CM | POA: Insufficient documentation

## 2015-12-27 LAB — VAS US CAROTID
LCCAPSYS: 165 cm/s
LEFT ECA DIAS: -12 cm/s
LEFT VERTEBRAL DIAS: -8 cm/s
Left CCA dist dias: 19 cm/s
Left CCA dist sys: 118 cm/s
Left CCA prox dias: 25 cm/s
RCCADSYS: -120 cm/s
RIGHT CCA MID DIAS: 16 cm/s
RIGHT ECA DIAS: 16 cm/s
RIGHT VERTEBRAL DIAS: -17 cm/s
Right CCA prox dias: 14 cm/s
Right CCA prox sys: 144 cm/s

## 2015-12-27 NOTE — Progress Notes (Signed)
Patient name: [name] MRN: [id number] DOB: 07/31/1940 Sex: male  REASON FOR VISIT: Follow up of carotid disease.  HPI: [name] is a 75 y.o. male who I last saw on 06/14/2015. At that time, his duplex scan showed a 60-79% right carotid stenosis with no significant stenosis on the left. He was asymptomatic. I set him up for a 6 month follow up duplex and office visit.  Since I saw him last, he denies any history of stroke, TIAs, expressive or receptive aphasia, or amaurosis fugax. He is on Coumadin, however he is not sure why. He does not take aspirin because of the Coumadin. Based on his CT scan which is discussed below he does have evidence of chronic thrombosis of the portal vein.  I did reviewed his notes from [facility]. He did undergo a CT scan to workup hematuria and was found to have evidence of cirrhosis of the liver with evidence of portal hypertension. No explanation for his microhematuria was found.  Past Medical History:  Diagnosis Date  . Anemia   . Arthritis    "hands" (09/27/2015)  . Carotid artery occlusion   . Cellulitis and abscess of leg 03/19/2013  . Cirrhosis of liver ([place])   . Clotting disorder ([place])   . Colon polyps   . CVA (cerebral vascular accident) ([place]) ~ 2000   "minor stroke", denies residual on 09/27/2015  . DVT (deep venous thrombosis) ([place]) 03/20/2013  . DVT (deep venous thrombosis) ([place]) 02/2013   RLE  . Esophageal varices ([place])   . Essential hypertension, benign 03/20/2013  . History of blood transfusion 1998   "blood clot began bleeding"  . Hyperlipidemia   . 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 Leukopenia 08/11/2012  . Mesenteric venous thrombosis 1996  . Mitral regurgitation   . Splenomegaly   . Type II diabetes mellitus ([place]) dx'd in the 1990s    Family History  Problem Relation Age of Onset  . Alzheimer's disease Father   . Dementia Father   . Cancer Sister     unknown type  . Diabetes Sister   .  Deep vein thrombosis Sister     Varicose vein  . Heart disease Mother     After age 93  . Hypertension Mother   . Pancreatic cancer Son     SOCIAL HISTORY: Social History  Substance Use Topics  . Smoking status: Never Smoker  . Smokeless tobacco: Never Used  . Alcohol use No    No Known Allergies  Current Outpatient Prescriptions  Medication Sig Dispense Refill  . atorvastatin (LIPITOR) 40 MG tablet Take 40 mg by mouth every evening.     . feeding supplement, GLUCERNA SHAKE, (GLUCERNA SHAKE) LIQD Take 237 mLs by mouth 2 (two) times daily between meals. 30 Can 0  . ferrous sulfate 325 (65 FE) MG EC tablet Take 1 tablet (325 mg total) by mouth 2 (two) times daily with a meal. 60 tablet 3  . glipiZIDE (GLUCOTROL) 10 MG tablet Take 10 mg by mouth 2 (two) times daily before a meal.      . losartan (COZAAR) 50 MG tablet Take 50 mg by mouth daily.     . metFORMIN (GLUCOPHAGE) 500 MG tablet Take 500 mg by mouth 2 (two) times daily with a meal.     . saxagliptin HCl (ONGLYZA) 5 MG TABS tablet Take 5 mg by mouth every evening.     . warfarin (COUMADIN) 2 MG tablet Take 2 mg  by mouth 2 (two) times daily.      No current facility-administered medications for this visit.     REVIEW OF SYSTEMS:  [X]  denotes positive finding, [ ]  denotes negative finding Cardiac  Comments:  Chest pain or chest pressure:    Shortness of breath upon exertion:    Short of breath when lying flat:    Irregular heart rhythm:        Vascular    Pain in calf, thigh, or hip brought on by ambulation:    Pain in feet at night that wakes you up from your sleep:     Blood clot in your veins:    Leg swelling:         Pulmonary    Oxygen at home:    Productive cough:     Wheezing:         Neurologic    Sudden weakness in arms or legs:     Sudden numbness in arms or legs:     Sudden onset of difficulty speaking or slurred speech:    Temporary loss of vision in one eye:     Problems with dizziness:           Gastrointestinal    Blood in stool:     Vomited blood:         Genitourinary    Burning when urinating:     Blood in urine: X Microscopic hematuria. Followed by urology.       Psychiatric    Major depression:         Hematologic    Bleeding problems:    Problems with blood clotting too easily:        Skin    Rashes or ulcers:        Constitutional    Fever or chills:      PHYSICAL EXAM: Vitals:   12/27/15 0952 12/27/15 0957  BP: (!) 140/58 136/62  Pulse: 60 (!) 58  Resp: 16   Temp: 97.5 F (36.4 C)   TempSrc: Oral   SpO2: 100%   Weight: 167 lb (75.8 kg)   Height: 5' 10"  (1.778 m)     GENERAL: The patient is a well-nourished male, in no acute distress. The vital signs are documented above. CARDIAC: There is a regular rate and rhythm.  VASCULAR: He has bilateral carotid bruits. Both feet are warm and well-perfused. He has varicose veins and hyperpigmentation bilaterally. PULMONARY: There is good air exchange bilaterally without wheezing or rales. ABDOMEN: Soft and non-tender with normal pitched bowel sounds.  MUSCULOSKELETAL: There are no major deformities or cyanosis. NEUROLOGIC: No focal weakness or paresthesias are detected. SKIN: There are no ulcers or rashes noted. PSYCHIATRIC: The patient has a normal affect.  DATA:   CAROTID DUPLEX: I have reviewed his carotid duplex scan which shows a 60-79% right carotid stenosis with less than 40% left carotid stenosis. The velocities in the right carotid have not changed in the last 6 months.  Both vertebral arteries are patent with antegrade flow.   MEDICAL ISSUES:  ASYMPTOMATIC 60-79% RIGHT CAROTID STENOSIS: He understands we would not consider carotid endarterectomy unless the right carotid stenosis progressed to greater than 80% or he develop new neurologic symptoms. He is not sure why he is on Coumadin and from my standpoint he does not need to be on Coumadin. If he is able to come off the Coumadin and given his  right carotid stenosis it would certainly be helpful for him to be  on aspirin. I have ordered a follow up carotid duplex scan and I'll see him back in 6 months. We have reviewed the potential symptoms of cerebrovascular disease and he knows to call if he develops such symptoms.  Deitra Mayo [facility] [phone]

## 2016-01-09 NOTE — Addendum Note (Signed)
Addended by: Lianne Cure A on: 01/09/2016 10:27 AM   Modules accepted: Orders

## 2016-01-16 DIAGNOSIS — Z7901 Long term (current) use of anticoagulants: Secondary | ICD-10-CM | POA: Diagnosis not present

## 2016-01-16 DIAGNOSIS — Z86718 Personal history of other venous thrombosis and embolism: Secondary | ICD-10-CM | POA: Diagnosis not present

## 2016-02-20 DIAGNOSIS — Z0000 Encounter for general adult medical examination without abnormal findings: Secondary | ICD-10-CM | POA: Diagnosis not present

## 2016-02-20 DIAGNOSIS — E119 Type 2 diabetes mellitus without complications: Secondary | ICD-10-CM | POA: Diagnosis not present

## 2016-02-20 DIAGNOSIS — I10 Essential (primary) hypertension: Secondary | ICD-10-CM | POA: Diagnosis not present

## 2016-02-20 DIAGNOSIS — Z7901 Long term (current) use of anticoagulants: Secondary | ICD-10-CM | POA: Diagnosis not present

## 2016-02-27 DIAGNOSIS — I10 Essential (primary) hypertension: Secondary | ICD-10-CM | POA: Diagnosis not present

## 2016-02-27 DIAGNOSIS — E119 Type 2 diabetes mellitus without complications: Secondary | ICD-10-CM | POA: Diagnosis not present

## 2016-02-27 DIAGNOSIS — E7800 Pure hypercholesterolemia, unspecified: Secondary | ICD-10-CM | POA: Diagnosis not present

## 2016-02-27 DIAGNOSIS — Z0000 Encounter for general adult medical examination without abnormal findings: Secondary | ICD-10-CM | POA: Diagnosis not present

## 2016-03-22 LAB — PROTIME-INR

## 2016-04-09 DIAGNOSIS — Z86718 Personal history of other venous thrombosis and embolism: Secondary | ICD-10-CM | POA: Diagnosis not present

## 2016-04-09 DIAGNOSIS — Z7901 Long term (current) use of anticoagulants: Secondary | ICD-10-CM | POA: Diagnosis not present

## 2016-05-02 ENCOUNTER — Encounter: Payer: Self-pay | Admitting: Gastroenterology

## 2016-05-28 ENCOUNTER — Telehealth: Payer: Self-pay

## 2016-05-28 DIAGNOSIS — Z86718 Personal history of other venous thrombosis and embolism: Secondary | ICD-10-CM | POA: Diagnosis not present

## 2016-05-28 DIAGNOSIS — Z7901 Long term (current) use of anticoagulants: Secondary | ICD-10-CM | POA: Diagnosis not present

## 2016-05-28 NOTE — Telephone Encounter (Signed)
-----   Message from Marlon Pel, RN sent at 11/27/2015 10:12 AM EDT ----- Needs [place] see results 11/27/15-stark

## 2016-05-28 NOTE — Telephone Encounter (Signed)
Left message for patient to call back and discuss scheduling 6 month follow up US.

## 2016-05-31 NOTE — Telephone Encounter (Signed)
Left message for patient to call back  

## 2016-06-03 NOTE — Telephone Encounter (Signed)
No return call from the patient, I will mail a letter asking him to call and [place]

## 2016-06-26 ENCOUNTER — Ambulatory Visit: Payer: Medicare Other | Admitting: Vascular Surgery

## 2016-06-26 ENCOUNTER — Encounter (HOSPITAL_COMMUNITY): Payer: Medicare Other

## 2016-07-01 ENCOUNTER — Encounter: Payer: Self-pay | Admitting: Vascular Surgery

## 2016-07-01 ENCOUNTER — Telehealth: Payer: Self-pay

## 2016-07-01 ENCOUNTER — Ambulatory Visit (INDEPENDENT_AMBULATORY_CARE_PROVIDER_SITE_OTHER): Payer: Medicare Other | Admitting: Gastroenterology

## 2016-07-01 ENCOUNTER — Ambulatory Visit: Payer: Medicare Other | Admitting: Gastroenterology

## 2016-07-01 ENCOUNTER — Encounter: Payer: Self-pay | Admitting: Gastroenterology

## 2016-07-01 VITALS — BP 140/52 | HR 72 | Ht 69.5 in | Wt 168.4 lb

## 2016-07-01 DIAGNOSIS — Z7901 Long term (current) use of anticoagulants: Secondary | ICD-10-CM

## 2016-07-01 DIAGNOSIS — I8500 Esophageal varices without bleeding: Secondary | ICD-10-CM | POA: Diagnosis not present

## 2016-07-01 DIAGNOSIS — K7460 Unspecified cirrhosis of liver: Secondary | ICD-10-CM

## 2016-07-01 NOTE — Telephone Encounter (Signed)
   [name] [birth year] [id number]  Dear Dr. Maudie Mercury:  We have scheduled the above named patient for a(n) Upper Endoscopy procedure. Our records show that (s)he is on anticoagulation therapy.  Please advise as to whether the patient may come off their therapy of coumadin 5 days prior to their procedure which is scheduled for 09/03/16.  Please route your response to Marlon Pel, CMA or fax response to [phone].  Sincerely,    [facility]

## 2016-07-01 NOTE — Patient Instructions (Addendum)
You have been scheduled for an abdominal ultrasound at [facility] (1st floor of hospital) on 07/08/16 at 9:00am. Please arrive 15 minutes prior to your appointment for registration. Make certain not to have anything to eat or drink 6 hours prior to your appointment. Should you need to reschedule your appointment, please contact radiology at [phone]. This test typically takes about 30 minutes to perform.  You have been scheduled for an endoscopy. Please follow written instructions given to you at your visit today. If you use inhalers (even only as needed), please bring them with you on the day of your procedure. Your physician has requested that you go to www.startemmi.com and enter the access code given to you at your visit today. This web site gives a general overview about your procedure. However, you should still follow specific instructions given to you by our office regarding your preparation for the procedure.  Thank you for choosing me and [facility].  Pricilla Riffle. Dagoberto Ligas., MD., [facility] Regal

## 2016-07-01 NOTE — Progress Notes (Signed)
History of Present Illness: This is a 76 year old male returning for follow-up of cirrhosis. He is accompanied by his wife. He has no ongoing gastrointestinal complaints. Blood work performed by Dr. Maudie Mercury in January was reviewed.  CMP glucose = 116, albumin = 3.4 otherwise normal. CBC: Hemoglobin = 10.8 platelet count = 49k. INR on Coumadin =2.3.  No Known Allergies Outpatient Medications Prior to Visit  Medication Sig Dispense Refill  . atorvastatin (LIPITOR) 40 MG tablet Take 40 mg by mouth every evening.     . feeding supplement, GLUCERNA SHAKE, (GLUCERNA SHAKE) LIQD Take 237 mLs by mouth 2 (two) times daily between meals. 30 Can 0  . ferrous sulfate 325 (65 FE) MG EC tablet Take 1 tablet (325 mg total) by mouth 2 (two) times daily with a meal. 60 tablet 3  . glipiZIDE (GLUCOTROL) 10 MG tablet Take 10 mg by mouth 2 (two) times daily before a meal.      . losartan (COZAAR) 50 MG tablet Take 50 mg by mouth daily.     . metFORMIN (GLUCOPHAGE) 500 MG tablet Take 500 mg by mouth 2 (two) times daily with a meal.     . saxagliptin HCl (ONGLYZA) 5 MG TABS tablet Take 5 mg by mouth every evening.     . warfarin (COUMADIN) 2 MG tablet Take 2 mg by mouth 2 (two) times daily.      No facility-administered medications prior to visit.    Past Medical History:  Diagnosis Date  . Anemia   . Arthritis    "hands" (09/27/2015)  . Carotid artery occlusion   . Cellulitis and abscess of leg 03/19/2013  . Cirrhosis of liver ([place])   . Clotting disorder ([place])   . Colon polyps   . CVA (cerebral vascular accident) ([place]) ~ 2000   "minor stroke", denies residual on 09/27/2015  . DVT (deep venous thrombosis) ([place]) 03/20/2013  . DVT (deep venous thrombosis) ([place]) 02/2013   RLE  . Esophageal varices ([place])   . Essential hypertension, benign 03/20/2013  . History of blood transfusion 1998   "blood clot began bleeding"  . Hyperlipidemia   . 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 Leukopenia  08/11/2012  . Mesenteric venous thrombosis 1996  . Mitral regurgitation   . Splenomegaly   . Type II diabetes mellitus ([place]) dx'd in the 1990s   Past Surgical History:  Procedure Laterality Date  . CATARACT EXTRACTION W/ INTRAOCULAR LENS IMPLANT Right 06/26/2012  . CATARACT EXTRACTION W/ INTRAOCULAR LENS IMPLANT Left 07/27/2012  . COLONOSCOPY     Social History   Social History  . Marital status: Married    Spouse name: N/A  . Number of children: 2  . Years of education: 12   Occupational History  . retired    Social History Main Topics  . Smoking status: Never Smoker  . Smokeless tobacco: Never Used  . Alcohol use No  . Drug use: No  . Sexual activity: No   Other Topics Concern  . None   Social History Narrative   Patient drinks 1 cup of coffee daily.   Patient is left handed.   Family History  Problem Relation Age of Onset  . Alzheimer's disease Father   . Dementia Father   . Cancer Sister        unknown type  . Diabetes Sister   . Deep vein thrombosis Sister        Varicose vein  . Heart disease Mother  After age 54  . Hypertension Mother   . Pancreatic cancer Son       Physical Exam: General: Well developed, well nourished, no acute distress Head: Normocephalic and atraumatic Eyes:  sclerae anicteric, EOMI Ears: Normal auditory acuity Mouth: No deformity or lesions Lungs: Clear throughout to auscultation Heart: Regular rate and rhythm; no murmurs, rubs or bruits Abdomen: Soft, non tender and non distended. No masses, hepatosplenomegaly or hernias noted. Normal Bowel sounds Musculoskeletal: Symmetrical with no gross deformities  Pulses:  Normal pulses noted Extremities: No clubbing, cyanosis, edema or deformities noted Neurological: Alert oriented x 4, grossly nonfocal Psychological:  Alert and cooperative. Normal mood and affect  Assessment and Recommendations:  1. Cirrhosis, likely secondary to NASH. Grade I esophageal varices. 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 RUQ [place] for [facility] screening. EGD for surveillance of varcies. The risks (including bleeding, perforation, infection, missed lesions, medication reactions and possible hospitalization or surgery if complications occur), benefits, and alternatives to endoscopy with possible biopsy and possible dilation were discussed with the patient and they consent to proceed. REV in 6 months.   2. 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 Colonoscopy by Dr. Lajoyce Corners in 2010 and possibly [facility] since then. Hb = 10.8. Follow up with Dr. Beryle Beams.   3. History of ITP. Plts =49k in January. Followed by Dr. Beryle Beams and he is due for 6 month REV.   4. History of DVT on Coumadin. Last year Dr. Beryle Beams felt it was reasonable to stop Coumadin however the pt remains on Coumadin. 6 month REV with Dr. Beryle Beams is due. Coumadin will increase the risk of GI bleeding from varices and AVMs so it would be ideal if Coumadin could be safely discontinued. Hold Coumadin 5 days before procedure - will instruct when and how to resume after procedure. Low but real risk of cardiovascular event such as heart attack, stroke, embolism, thrombosis or ischemia/infarct of other organs off Coumadin explained and need to seek urgent help if this occurs. The patient consents to proceed. Will communicate by phone or EMR with patient's prescribing provider to confirm that holding Coumadin is reasonable in this case.

## 2016-07-02 DIAGNOSIS — S0502XA Injury of conjunctiva and corneal abrasion without foreign body, left eye, initial encounter: Secondary | ICD-10-CM | POA: Diagnosis not present

## 2016-07-02 DIAGNOSIS — H10412 Chronic giant papillary conjunctivitis, left eye: Secondary | ICD-10-CM | POA: Diagnosis not present

## 2016-07-02 DIAGNOSIS — H18413 Arcus senilis, bilateral: Secondary | ICD-10-CM | POA: Diagnosis not present

## 2016-07-02 DIAGNOSIS — H5712 Ocular pain, left eye: Secondary | ICD-10-CM | POA: Diagnosis not present

## 2016-07-02 DIAGNOSIS — H11423 Conjunctival edema, bilateral: Secondary | ICD-10-CM | POA: Diagnosis not present

## 2016-07-03 ENCOUNTER — Ambulatory Visit (INDEPENDENT_AMBULATORY_CARE_PROVIDER_SITE_OTHER): Payer: Medicare Other | Admitting: Vascular Surgery

## 2016-07-03 ENCOUNTER — Ambulatory Visit (HOSPITAL_COMMUNITY)
Admission: RE | Admit: 2016-07-03 | Discharge: 2016-07-03 | Disposition: A | Discharge status: Home / Self Care | Payer: Medicare Other | Source: Ambulatory Visit | Attending: Vascular Surgery | Admitting: Vascular Surgery

## 2016-07-03 ENCOUNTER — Other Ambulatory Visit: Payer: Self-pay | Admitting: Oncology

## 2016-07-03 ENCOUNTER — Encounter: Payer: Self-pay | Admitting: Vascular Surgery

## 2016-07-03 ENCOUNTER — Telehealth: Payer: Self-pay | Admitting: *Deleted

## 2016-07-03 VITALS — BP 117/66 | HR 58 | Temp 97.1°F | Resp 16 | Ht 70.0 in | Wt 167.0 lb

## 2016-07-03 DIAGNOSIS — I6523 Occlusion and stenosis of bilateral carotid arteries: Secondary | ICD-10-CM

## 2016-07-03 LAB — VAS US CAROTID
LEFT ECA DIAS: -20 cm/s
LEFT VERTEBRAL DIAS: -12 cm/s
LICADDIAS: -30 cm/s
Left CCA dist dias: 16 cm/s
Left CCA dist sys: 110 cm/s
Left CCA prox dias: 25 cm/s
Left CCA prox sys: 144 cm/s
Left ICA dist sys: -128 cm/s
RCCADSYS: -118 cm/s
RCCAPDIAS: 17 cm/s
RIGHT CCA MID DIAS: 10 cm/s
RIGHT ECA DIAS: -11 cm/s
RIGHT VERTEBRAL DIAS: 17 cm/s
Right CCA prox sys: 122 cm/s

## 2016-07-03 MED ORDER — ASPIRIN EC 81 MG PO TBEC: 81.0000 mg | DELAYED_RELEASE_TABLET | Freq: Every day | ORAL | 2 refills | Status: AC

## 2016-07-03 NOTE — Progress Notes (Signed)
Patient name: [name] MRN: [id number] DOB: [birth year] Sex: male  REASON FOR VISIT:    Follow up of carotid disease.  HPI:   [name] is a 76 y.o. male who I last saw on 12/27/2015. I have been following him with carotid disease. At the time of his last visit he had a 60-79% right carotid stenosis with a less than 40% left carotid stenosis. He comes in for six-month follow up visit.  Since I saw him last, he denies any history of stroke, TIAs, expressive or receptive aphasia, or amaurosis fugax.  He is on a statin. He does not take aspirin because he is on Coumadin for a remote history of mesenteric venous thrombosis. He does not have any clotting disorders that he is aware of.  He has been diagnosed with cirrhosis and the gastroenterologist was recommending discontinuing his Coumadin which I agree with.   Past Medical History:  Diagnosis Date  . Anemia   . Arthritis    "hands" (09/27/2015)  . Carotid artery occlusion   . Cellulitis and abscess of leg 03/19/2013  . Cirrhosis of liver ([place])   . Clotting disorder ([place])   . Colon polyps   . CVA (cerebral vascular accident) ([place]) ~ 2000   "minor stroke", denies residual on 09/27/2015  . DVT (deep venous thrombosis) ([place]) 03/20/2013  . DVT (deep venous thrombosis) ([place]) 02/2013   RLE  . Esophageal varices ([place])   . Essential hypertension, benign 03/20/2013  . History of blood transfusion 1998   "blood clot began bleeding"  . Hyperlipidemia   . 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 Leukopenia 08/11/2012  . Mesenteric venous thrombosis 1996  . Mitral regurgitation   . Splenomegaly   . Type II diabetes mellitus ([place]) dx'd in the 1990s    Family History  Problem Relation Age of Onset  . Alzheimer's disease Father   . Dementia Father   . Cancer Sister        unknown type  . Diabetes Sister   . Deep vein thrombosis Sister        Varicose vein  . Heart disease Mother        After age 31  .  Hypertension Mother   . Pancreatic cancer Son     SOCIAL HISTORY: Social History  Substance Use Topics  . Smoking status: Never Smoker  . Smokeless tobacco: Never Used  . Alcohol use No    No Known Allergies  Current Outpatient Prescriptions  Medication Sig Dispense Refill  . atorvastatin (LIPITOR) 40 MG tablet Take 40 mg by mouth every evening.     . feeding supplement, GLUCERNA SHAKE, (GLUCERNA SHAKE) LIQD Take 237 mLs by mouth 2 (two) times daily between meals. 30 Can 0  . ferrous sulfate 325 (65 FE) MG EC tablet Take 1 tablet (325 mg total) by mouth 2 (two) times daily with a meal. 60 tablet 3  . glipiZIDE (GLUCOTROL) 10 MG tablet Take 10 mg by mouth 2 (two) times daily before a meal.      . losartan (COZAAR) 50 MG tablet Take 50 mg by mouth daily.     . metFORMIN (GLUCOPHAGE) 500 MG tablet Take 500 mg by mouth 2 (two) times daily with a meal.     . saxagliptin HCl (ONGLYZA) 5 MG TABS tablet Take 5 mg by mouth every evening.     . warfarin (COUMADIN) 2 MG tablet Take 2 mg by mouth 2 (two) times daily.  No current facility-administered medications for this visit.     REVIEW OF SYSTEMS:  [X]  denotes positive finding, [ ]  denotes negative finding Cardiac  Comments:  Chest pain or chest pressure:    Shortness of breath upon exertion:    Short of breath when lying flat:    Irregular heart rhythm:        Vascular    Pain in calf, thigh, or hip brought on by ambulation:    Pain in feet at night that wakes you up from your sleep:     Blood clot in your veins:    Leg swelling:         Pulmonary    Oxygen at home:    Productive cough:     Wheezing:         Neurologic    Sudden weakness in arms or legs:     Sudden numbness in arms or legs:     Sudden onset of difficulty speaking or slurred speech:    Temporary loss of vision in one eye:     Problems with dizziness:         Gastrointestinal    Blood in stool:     Vomited blood:         Genitourinary    Burning  when urinating:     Blood in urine:        Psychiatric    Major depression:         Hematologic    Bleeding problems:    Problems with blood clotting too easily:        Skin    Rashes or ulcers:        Constitutional    Fever or chills:     PHYSICAL EXAM:   Vitals:   07/03/16 1223  BP: 117/66  Pulse: (!) 58  Resp: 16  Temp: 97.1 F (36.2 C)  TempSrc: Oral  SpO2: 100%  Weight: 167 lb (75.8 kg)  Height: 5' 10"  (1.778 m)    GENERAL: The patient is a well-nourished male, in no acute distress. The vital signs are documented above. CARDIAC: There is a regular rate and rhythm.  VASCULAR: He has a right carotid bruit. PULMONARY: There is good air exchange bilaterally without wheezing or rales. ABDOMEN: Soft and non-tender with normal pitched bowel sounds.  MUSCULOSKELETAL: There are no major deformities or cyanosis. NEUROLOGIC: No focal weakness or paresthesias are detected. SKIN: There are no ulcers or rashes noted. PSYCHIATRIC: The patient has a normal affect.  DATA:    CAROTID DUPLEX: I have independently interpreted his carotid duplex scan today.  On the right side he has a 60-79% right carotid stenosis. The velocities on the right have decreased compared to the study back in November 2017.  He has no significant stenosis on the left side.   Both vertebral arteries are patent with antegrade flow.  MEDICAL ISSUES:   RIGHT CAROTID STENOSIS: The patient has a stable 60-79% right carotid stenosis. The end-diastolic velocity is 79 cm/s suggesting this is in the mid range. He is asymptomatic. He is on a statin. I would favor discontinuing his Coumadin and starting him on aspirin 81 mg daily. I've ordered a follow up duplex can in 6 months and we will see him back at that time. Given that the plaque has been stable he will follow up with our nurse practitioner or physician's assistant. He knows to call sooner if he has problems.  Deitra [facility] [employer] [phone]

## 2016-07-03 NOTE — Telephone Encounter (Signed)
He can stop coumadin right away since it slowly gets out of his system over the next 4 days

## 2016-07-03 NOTE — Telephone Encounter (Signed)
Return call from pt's wife,Elsie. States he will start baby asa tomorrow. Wants to know if he can stop the Coumadin suddenly or should it be gradually? Thanks

## 2016-07-05 NOTE — Telephone Encounter (Signed)
Talked to [name] - informed "He can stop coumadin right away since it slowly gets out of his system over the next 4 days" per Dr Beryle Beams. She stated "thank-you so much".

## 2016-07-08 ENCOUNTER — Ambulatory Visit (HOSPITAL_COMMUNITY)
Admission: RE | Admit: 2016-07-08 | Discharge: 2016-07-08 | Disposition: A | Discharge status: Home / Self Care | Payer: Medicare Other | Source: Ambulatory Visit | Attending: Gastroenterology | Admitting: Gastroenterology

## 2016-07-08 DIAGNOSIS — K824 Cholesterolosis of gallbladder: Secondary | ICD-10-CM | POA: Diagnosis not present

## 2016-07-08 DIAGNOSIS — I8500 Esophageal varices without bleeding: Secondary | ICD-10-CM | POA: Diagnosis not present

## 2016-07-08 DIAGNOSIS — K7460 Unspecified cirrhosis of liver: Secondary | ICD-10-CM | POA: Diagnosis not present

## 2016-07-09 DIAGNOSIS — Z86718 Personal history of other venous thrombosis and embolism: Secondary | ICD-10-CM | POA: Diagnosis not present

## 2016-07-09 DIAGNOSIS — Z7901 Long term (current) use of anticoagulants: Secondary | ICD-10-CM | POA: Diagnosis not present

## 2016-07-10 NOTE — Addendum Note (Signed)
Addended by: Lianne Cure A on: 07/10/2016 03:26 PM   Modules accepted: Orders

## 2016-07-22 ENCOUNTER — Telehealth: Payer: Self-pay

## 2016-07-22 NOTE — Telephone Encounter (Signed)
Injection scheduled for 07/25/15

## 2016-07-22 NOTE — Telephone Encounter (Signed)
-----   Message from [place] sent at 08/18/2015  9:05 AM EDT ----- Needs yearly booster Twinrix injection.

## 2016-07-22 NOTE — Telephone Encounter (Signed)
Spoke with patient's wife and informed her that her husband is due for her last Twinrix injection. Patient's wife states she will have him call me back or she will with date options to make the appt.

## 2016-07-22 NOTE — Telephone Encounter (Deleted)
-----   Message from [place] sent at 08/18/2015  9:05 AM EDT ----- Needs yearly booster Twinrix injection.

## 2016-07-24 ENCOUNTER — Ambulatory Visit (INDEPENDENT_AMBULATORY_CARE_PROVIDER_SITE_OTHER): Payer: Medicare Other | Admitting: Gastroenterology

## 2016-07-24 DIAGNOSIS — Z23 Encounter for immunization: Secondary | ICD-10-CM

## 2016-07-26 NOTE — Telephone Encounter (Signed)
See phone note from 07/03/16 and office visit note from Dr. Scot Dock on 07/03/16. Spoke with patient and he has confirmed that he has stopped coumadin per Dr. Scot Dock.

## 2016-07-29 DIAGNOSIS — H353111 Nonexudative age-related macular degeneration, right eye, early dry stage: Secondary | ICD-10-CM | POA: Diagnosis not present

## 2016-07-29 DIAGNOSIS — H1851 Endothelial corneal dystrophy: Secondary | ICD-10-CM | POA: Diagnosis not present

## 2016-07-29 DIAGNOSIS — H35371 Puckering of macula, right eye: Secondary | ICD-10-CM | POA: Diagnosis not present

## 2016-07-29 DIAGNOSIS — H35361 Drusen (degenerative) of macula, right eye: Secondary | ICD-10-CM | POA: Diagnosis not present

## 2016-07-29 DIAGNOSIS — H40003 Preglaucoma, unspecified, bilateral: Secondary | ICD-10-CM | POA: Diagnosis not present

## 2016-08-16 DIAGNOSIS — H11423 Conjunctival edema, bilateral: Secondary | ICD-10-CM | POA: Diagnosis not present

## 2016-08-16 DIAGNOSIS — H40013 Open angle with borderline findings, low risk, bilateral: Secondary | ICD-10-CM | POA: Diagnosis not present

## 2016-08-19 DIAGNOSIS — I10 Essential (primary) hypertension: Secondary | ICD-10-CM | POA: Diagnosis not present

## 2016-08-19 DIAGNOSIS — E119 Type 2 diabetes mellitus without complications: Secondary | ICD-10-CM | POA: Diagnosis not present

## 2016-08-28 DIAGNOSIS — I10 Essential (primary) hypertension: Secondary | ICD-10-CM | POA: Diagnosis not present

## 2016-08-28 DIAGNOSIS — E7800 Pure hypercholesterolemia, unspecified: Secondary | ICD-10-CM | POA: Diagnosis not present

## 2016-08-28 DIAGNOSIS — E119 Type 2 diabetes mellitus without complications: Secondary | ICD-10-CM | POA: Diagnosis not present

## 2016-08-29 ENCOUNTER — Encounter (HOSPITAL_COMMUNITY): Payer: Self-pay | Admitting: *Deleted

## 2016-09-02 ENCOUNTER — Encounter: Payer: Medicare Other | Admitting: Oncology

## 2016-09-03 ENCOUNTER — Ambulatory Visit (HOSPITAL_COMMUNITY): Payer: Medicare Other | Admitting: Anesthesiology

## 2016-09-03 ENCOUNTER — Encounter (HOSPITAL_COMMUNITY)
Admission: RE | Disposition: A | Discharge status: Home / Self Care | Payer: Self-pay | Source: Ambulatory Visit | Attending: Gastroenterology

## 2016-09-03 ENCOUNTER — Encounter (HOSPITAL_COMMUNITY): Payer: Self-pay | Admitting: *Deleted

## 2016-09-03 ENCOUNTER — Ambulatory Visit (HOSPITAL_COMMUNITY)
Admission: RE | Admit: 2016-09-03 | Discharge: 2016-09-03 | Disposition: A | Discharge status: Home / Self Care | Payer: Medicare Other | Source: Ambulatory Visit | Attending: Gastroenterology | Admitting: Gastroenterology

## 2016-09-03 DIAGNOSIS — K3189 Other diseases of stomach and duodenum: Secondary | ICD-10-CM | POA: Diagnosis not present

## 2016-09-03 DIAGNOSIS — Z8673 Personal history of transient ischemic attack (TIA), and cerebral infarction without residual deficits: Secondary | ICD-10-CM | POA: Insufficient documentation

## 2016-09-03 DIAGNOSIS — Z833 Family history of diabetes mellitus: Secondary | ICD-10-CM | POA: Diagnosis not present

## 2016-09-03 DIAGNOSIS — Z818 Family history of other mental and behavioral disorders: Secondary | ICD-10-CM | POA: Diagnosis not present

## 2016-09-03 DIAGNOSIS — Z9842 Cataract extraction status, left eye: Secondary | ICD-10-CM | POA: Insufficient documentation

## 2016-09-03 DIAGNOSIS — Z7901 Long term (current) use of anticoagulants: Secondary | ICD-10-CM | POA: Diagnosis not present

## 2016-09-03 DIAGNOSIS — M13842 Other specified arthritis, left hand: Secondary | ICD-10-CM | POA: Insufficient documentation

## 2016-09-03 DIAGNOSIS — Z79899 Other long term (current) drug therapy: Secondary | ICD-10-CM | POA: Diagnosis not present

## 2016-09-03 DIAGNOSIS — Z86010 Personal history of colonic polyps: Secondary | ICD-10-CM | POA: Diagnosis not present

## 2016-09-03 DIAGNOSIS — M13841 Other specified arthritis, right hand: Secondary | ICD-10-CM | POA: Diagnosis not present

## 2016-09-03 DIAGNOSIS — Z9841 Cataract extraction status, right eye: Secondary | ICD-10-CM | POA: Insufficient documentation

## 2016-09-03 DIAGNOSIS — K210 Gastro-esophageal reflux disease with esophagitis: Secondary | ICD-10-CM | POA: Insufficient documentation

## 2016-09-03 DIAGNOSIS — E785 Hyperlipidemia, unspecified: Secondary | ICD-10-CM | POA: Diagnosis not present

## 2016-09-03 DIAGNOSIS — D508 Other iron deficiency anemias: Secondary | ICD-10-CM | POA: Diagnosis not present

## 2016-09-03 DIAGNOSIS — K7460 Unspecified cirrhosis of liver: Secondary | ICD-10-CM | POA: Diagnosis not present

## 2016-09-03 DIAGNOSIS — D693 Immune thrombocytopenic purpura: Secondary | ICD-10-CM | POA: Diagnosis not present

## 2016-09-03 DIAGNOSIS — I8510 Secondary esophageal varices without bleeding: Secondary | ICD-10-CM | POA: Diagnosis not present

## 2016-09-03 DIAGNOSIS — I8500 Esophageal varices without bleeding: Secondary | ICD-10-CM | POA: Insufficient documentation

## 2016-09-03 DIAGNOSIS — Z800 Family history of malignant neoplasm of digestive organs: Secondary | ICD-10-CM | POA: Insufficient documentation

## 2016-09-03 DIAGNOSIS — I81 Portal vein thrombosis: Secondary | ICD-10-CM | POA: Insufficient documentation

## 2016-09-03 DIAGNOSIS — Z7984 Long term (current) use of oral hypoglycemic drugs: Secondary | ICD-10-CM | POA: Insufficient documentation

## 2016-09-03 DIAGNOSIS — E119 Type 2 diabetes mellitus without complications: Secondary | ICD-10-CM | POA: Insufficient documentation

## 2016-09-03 DIAGNOSIS — K766 Portal hypertension: Secondary | ICD-10-CM | POA: Diagnosis not present

## 2016-09-03 DIAGNOSIS — K209 Esophagitis, unspecified: Secondary | ICD-10-CM | POA: Diagnosis not present

## 2016-09-03 DIAGNOSIS — Z8249 Family history of ischemic heart disease and other diseases of the circulatory system: Secondary | ICD-10-CM | POA: Insufficient documentation

## 2016-09-03 DIAGNOSIS — Z86718 Personal history of other venous thrombosis and embolism: Secondary | ICD-10-CM | POA: Diagnosis not present

## 2016-09-03 DIAGNOSIS — I340 Nonrheumatic mitral (valve) insufficiency: Secondary | ICD-10-CM | POA: Diagnosis not present

## 2016-09-03 DIAGNOSIS — Z832 Family history of diseases of the blood and blood-forming organs and certain disorders involving the immune mechanism: Secondary | ICD-10-CM | POA: Insufficient documentation

## 2016-09-03 DIAGNOSIS — I6529 Occlusion and stenosis of unspecified carotid artery: Secondary | ICD-10-CM | POA: Diagnosis not present

## 2016-09-03 HISTORY — PX: ESOPHAGOGASTRODUODENOSCOPY (EGD) WITH PROPOFOL: SHX5813

## 2016-09-03 LAB — GLUCOSE, CAPILLARY: GLUCOSE-CAPILLARY: 115 mg/dL — AB (ref 65–99)

## 2016-09-03 SURGERY — ESOPHAGOGASTRODUODENOSCOPY (EGD) WITH PROPOFOL
Anesthesia: Monitor Anesthesia Care

## 2016-09-03 MED ORDER — ONDANSETRON HCL 4 MG/2ML IJ SOLN
INTRAMUSCULAR | Status: DC | PRN
  Administered 2016-09-03: 4 mg via INTRAVENOUS

## 2016-09-03 MED ORDER — ONDANSETRON HCL 4 MG/2ML IJ SOLN
INTRAMUSCULAR | Status: AC
  Filled 2016-09-03: qty 2

## 2016-09-03 MED ORDER — LIDOCAINE 2% (20 MG/ML) 5 ML SYRINGE
INTRAMUSCULAR | Status: AC
  Filled 2016-09-03: qty 5

## 2016-09-03 MED ORDER — LACTATED RINGERS IV SOLN
INTRAVENOUS | Status: DC
  Administered 2016-09-03: 08:00:00 via INTRAVENOUS

## 2016-09-03 MED ORDER — LIDOCAINE 2% (20 MG/ML) 5 ML SYRINGE
INTRAMUSCULAR | Status: DC | PRN
  Administered 2016-09-03: 100 mg via INTRAVENOUS

## 2016-09-03 MED ORDER — PROPOFOL 10 MG/ML IV BOLUS
INTRAVENOUS | Status: AC
  Filled 2016-09-03: qty 40

## 2016-09-03 MED ORDER — PROPOFOL 10 MG/ML IV BOLUS
INTRAVENOUS | Status: DC | PRN
  Administered 2016-09-03 (×2): 20 mg via INTRAVENOUS

## 2016-09-03 MED ORDER — SODIUM CHLORIDE 0.9 % IV SOLN: INTRAVENOUS | Status: DC

## 2016-09-03 MED ORDER — PROPOFOL 500 MG/50ML IV EMUL
INTRAVENOUS | Status: DC | PRN
  Administered 2016-09-03: 200 ug/kg/min via INTRAVENOUS

## 2016-09-03 SURGICAL SUPPLY — 15 items

## 2016-09-03 NOTE — Anesthesia Preprocedure Evaluation (Signed)
Anesthesia Evaluation  Patient identified by MRN, date of birth, ID band Patient awake    Reviewed: Allergy & Precautions, NPO status , Patient's Chart, lab work & pertinent test results  Airway Mallampati: II  TM Distance: >3 FB     Dental   Pulmonary    breath sounds clear to auscultation       Cardiovascular hypertension, + Peripheral Vascular Disease   Rhythm:Regular Rate:Normal     Neuro/Psych    GI/Hepatic negative GI ROS, Neg liver ROS,   Endo/Other  diabetes  Renal/GU negative Renal ROS     Musculoskeletal  (+) Arthritis ,   Abdominal   Peds  Hematology  (+) anemia ,   Anesthesia Other Findings   Reproductive/Obstetrics                             Anesthesia Physical Anesthesia Plan  ASA: III  Anesthesia Plan: MAC   Post-op Pain Management:    Induction: Intravenous  PONV Risk Score and Plan: 1 and Ondansetron and Dexamethasone  Airway Management Planned: Simple Face Mask  Additional Equipment:   Intra-op Plan:   Post-operative Plan:   Informed Consent: I have reviewed the patients History and Physical, chart, labs and discussed the procedure including the risks, benefits and alternatives for the proposed anesthesia with the patient or authorized representative who has indicated his/her understanding and acceptance.   Dental advisory given  Plan Discussed with: CRNA and Anesthesiologist  Anesthesia Plan Comments:         Anesthesia Quick Evaluation

## 2016-09-03 NOTE — H&P (Signed)
History of Present Illness: This is a 76 year old male here for surveillance of esophageal varices. He has no ongoing gastrointestinal complaints. Blood work performed by Dr. Maudie Mercury in January was reviewed.  CMP glucose = 116, albumin = 3.4 otherwise normal. CBC: Hemoglobin = 10.8 platelet count = 49k. INR on Coumadin =2.3.  No Known Allergies       Outpatient Medications Prior to Visit  Medication Sig Dispense Refill  . atorvastatin (LIPITOR) 40 MG tablet Take 40 mg by mouth every evening.     . feeding supplement, GLUCERNA SHAKE, (GLUCERNA SHAKE) LIQD Take 237 mLs by mouth 2 (two) times daily between meals. 30 Can 0  . ferrous sulfate 325 (65 FE) MG EC tablet Take 1 tablet (325 mg total) by mouth 2 (two) times daily with a meal. 60 tablet 3  . glipiZIDE (GLUCOTROL) 10 MG tablet Take 10 mg by mouth 2 (two) times daily before a meal.      . losartan (COZAAR) 50 MG tablet Take 50 mg by mouth daily.     . metFORMIN (GLUCOPHAGE) 500 MG tablet Take 500 mg by mouth 2 (two) times daily with a meal.     . saxagliptin HCl (ONGLYZA) 5 MG TABS tablet Take 5 mg by mouth every evening.     . warfarin (COUMADIN) 2 MG tablet Take 2 mg by mouth 2 (two) times daily.      No facility-administered medications prior to visit.        Past Medical History:  Diagnosis Date  . Anemia   . Arthritis    "hands" (09/27/2015)  . Carotid artery occlusion   . Cellulitis and abscess of leg 03/19/2013  . Cirrhosis of liver ([place])   . Clotting disorder ([place])   . Colon polyps   . CVA (cerebral vascular accident) ([place]) ~ 2000   "minor stroke", denies residual on 09/27/2015  . DVT (deep venous thrombosis) ([place]) 03/20/2013  . DVT (deep venous thrombosis) ([place]) 02/2013   RLE  . Esophageal varices ([place])   . Essential hypertension, benign 03/20/2013  . History of blood transfusion 1998   "blood clot began bleeding"  . Hyperlipidemia   . 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 Leukopenia 08/11/2012  . Mesenteric venous thrombosis 1996  . Mitral regurgitation   . Splenomegaly   . Type II diabetes mellitus ([place]) dx'd in the 1990s        Past Surgical History:  Procedure Laterality Date  . CATARACT EXTRACTION W/ INTRAOCULAR LENS IMPLANT Right 06/26/2012  . CATARACT EXTRACTION W/ INTRAOCULAR LENS IMPLANT Left 07/27/2012  . COLONOSCOPY     Social History        Social History  . Marital status: Married    Spouse name: N/A  . Number of children: 2  . Years of education: 12       Occupational History  . retired        Social History Main Topics  . Smoking status: Never Smoker  . Smokeless tobacco: Never Used  . Alcohol use No  . Drug use: No  . Sexual activity: No       Other Topics Concern  . None      Social History Narrative   Patient drinks 1 cup of coffee daily.   Patient is left handed.        Family History  Problem Relation Age of Onset  . Alzheimer's disease Father   . Dementia Father   .  Cancer Sister        unknown type  . Diabetes Sister   . Deep vein thrombosis Sister        Varicose vein  . Heart disease Mother        After age 46  . Hypertension Mother   . Pancreatic cancer Son      Physical Exam: General: Well developed, well nourished, no acute distress Head: Normocephalic and atraumatic Eyes:  sclerae anicteric, EOMI Ears: Normal auditory acuity Mouth: No deformity or lesions Lungs: Clear throughout to auscultation Heart: Regular rate and rhythm; no murmurs, rubs or bruits Abdomen: Soft, non tender and non distended. No masses, hepatosplenomegaly or hernias noted. Normal Bowel sounds Musculoskeletal: Symmetrical with no gross deformities  Pulses:  Normal pulses noted Extremities: No clubbing, cyanosis, edema or deformities noted Neurological: Alert oriented x 4, grossly nonfocal Psychological:  Alert and cooperative. Normal mood and affect  Assessment and  Recommendations:  1. Cirrhosis, likely secondary to NASH. Grade I esophageal varices. 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 RUQ [place] for [facility] screening. EGD for surveillance of varcies. The risks (including bleeding, perforation, infection, missed lesions, medication reactions and possible hospitalization or surgery if complications occur), benefits, and alternatives to endoscopy with possible biopsy and possible dilation were discussed with the patient and they consent to proceed.   2. 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 Colonoscopy by Dr. Lajoyce Corners in 2010 and possibly [facility] since then. Hb = 10.8. Follow up with Dr. Beryle Beams.   3. History of ITP. Plts =49k in January. Followed by Dr. Beryle Beams.   4. History of DVT. Coumadin was discontinued.

## 2016-09-03 NOTE — Anesthesia Postprocedure Evaluation (Signed)
Anesthesia Post Note  Patient: [name]  Procedure(s) Performed: Procedure(s) (LRB): ESOPHAGOGASTRODUODENOSCOPY (EGD) WITH PROPOFOL (N/A)     Patient location during evaluation: PACU Anesthesia Type: MAC Level of consciousness: awake Pain management: pain level controlled Vital Signs Assessment: post-procedure vital signs reviewed and stable Respiratory status: spontaneous breathing Cardiovascular status: stable Anesthetic complications: no    Last Vitals:  Vitals:   09/03/16 1000 09/03/16 1005  BP: 131/60 (!) 135/47  Pulse: (!) 59 (!) 53  Resp: 20 18  Temp:      Last Pain:  Vitals:   09/03/16 0950  TempSrc: Oral                 Adebayo Ensminger

## 2016-09-03 NOTE — Discharge Instructions (Signed)
Esophagogastroduodenoscopy, Care After °Refer to this sheet in the next few weeks. These instructions provide you with information about caring for yourself after your procedure. Your health care provider may also give you more specific instructions. Your treatment has been planned according to current medical practices, but problems sometimes occur. Call your health care provider if you have any problems or questions after your procedure. °What can I expect after the procedure? °After the procedure, it is common to have: °· A sore throat. °· Nausea. °· Bloating. °· Dizziness. °· Fatigue. ° °Follow these instructions at home: °· Do not eat or drink anything until the numbing medicine (local anesthetic) has worn off and your gag reflex has returned. You will know that the local anesthetic has worn off when you can swallow comfortably. °· Do not drive for 24 hours if you received a medicine to help you relax (sedative). °· If your health care provider took a tissue sample for testing during the procedure, make sure to get your test results. This is your responsibility. Ask your health care provider or the department performing the test when your results will be ready. °· Keep all follow-up visits as told by your health care provider. This is important. °Contact a health care provider if: °· You cannot stop coughing. °· You are not urinating. °· You are urinating less than usual. °Get help right away if: °· You have trouble swallowing. °· You cannot eat or drink. °· You have throat or chest pain that gets worse. °· You are dizzy or light-headed. °· You faint. °· You have nausea or vomiting. °· You have chills. °· You have a fever. °· You have severe abdominal pain. °· You have black, tarry, or bloody stools. °This information is not intended to replace advice given to you by your health care provider. Make sure you discuss any questions you have with your health care provider. °Document Released: 01/22/2012 Document  Revised: 07/13/2015 Document Reviewed: 12/29/2014 °Elsevier Interactive Patient Education © 2018 [employer] ° °

## 2016-09-03 NOTE — Op Note (Signed)
[facility] Patient Name: [name] Procedure Date: 09/03/2016 MRN: [id number] Attending MD: Ladene Artist , MD Date of Birth: 1940-12-16 CSN: [id number] Age: 76 Admit Type: Outpatient Procedure:                Upper GI endoscopy Indications:              Surveillance procedure, Follow-up of esophageal                            varices Providers:                Pricilla Riffle. Fuller Plan, MD, Laverta Baltimore RN, RN, Tinnie Gens, Technician, Virgia Land, CRNA Referring MD:              Medicines:                Monitored Anesthesia Care Complications:            No immediate complications. Estimated Blood Loss:     Estimated blood loss: none. Procedure:                Pre-Anesthesia Assessment:                           - Prior to the procedure, a History and Physical                            was performed, and patient medications and                            allergies were reviewed. The patient's tolerance of                            previous anesthesia was also reviewed. The risks                            and benefits of the procedure and the sedation                            options and risks were discussed with the patient.                            All questions were answered, and informed consent                            was obtained. Prior Anticoagulants: The patient has                            taken no previous anticoagulant or antiplatelet                            agents. ASA Grade Assessment: III - A patient with  severe systemic disease. After reviewing the risks                            and benefits, the patient was deemed in                            satisfactory condition to undergo the procedure.                           After obtaining informed consent, the endoscope was                            passed under direct vision. Throughout the                            procedure, the  patient's blood pressure, pulse, and                            oxygen saturations were monitored continuously. The                            [id number]) scope was introduced through the                            mouth, and advanced to the second part of duodenum.                            The upper GI endoscopy was accomplished without                            difficulty. The patient tolerated the procedure                            well. Scope In: Scope Out: Findings:      LA Grade A (one or more mucosal breaks less than 5 mm, not extending       between tops of 2 mucosal folds) esophagitis with no bleeding was found       in the distal esophagus.      Two columns of grade I varices were found in the distal esophagus. They       were 3 mm in largest diameter. No stigmata of recent bleeding were       evident and no red wale signs were present. The varices appeared       unchanged in size from prior exam.      The exam of the esophagus was otherwise normal.      Mild portal hypertensive gastropathy was found in the gastric fundus and       in the gastric body.      The exam of the stomach was otherwise normal.      The duodenal bulb and second portion of the duodenum were normal. Impression:               - LA Grade A reflux esophagitis.                           -  Non-bleeding grade I esophageal varices.                           - Mild portal hypertensive gastropathy.                           - Normal duodenal bulb and second portion of the                            duodenum.                           - No specimens collected. Moderate Sedation:      N/A- Per Anesthesia Care Recommendation:           - Patient has a contact number available for                            emergencies. The signs and symptoms of potential                            delayed complications were discussed with the                            patient. Return to normal activities tomorrow.                             Written discharge instructions were provided to the                            patient.                           - Resume previous diet.                           - Continue present medications.                           - Repeat upper endoscopy in 1 year for surveillance.                           - Prilosec 20 mg OTC po qam long term                           - Antireflux measures. Procedure Code(s):        --- Professional ---                           [phone], Esophagogastroduodenoscopy, flexible,                            transoral; diagnostic, including collection of                            specimen(s) by brushing or washing, when performed                            (  separate procedure) Diagnosis Code(s):        --- Professional ---                           K21.0, Gastro-esophageal reflux disease with                            esophagitis                           I85.00, Esophageal varices without bleeding                           K76.6, Portal hypertension                           K31.89, Other diseases of stomach and duodenum CPT copyright 2016 [employer] Association. All rights reserved. The codes documented in this report are preliminary and upon coder review may  be revised to meet current compliance requirements. Ladene Artist, MD 09/03/2016 9:51:29 AM This report has been signed electronically. Number of Addenda: 0

## 2016-09-03 NOTE — Transfer of Care (Signed)
Immediate Anesthesia Transfer of Care Note  Patient: [name]  Procedure(s) Performed: Procedure(s): ESOPHAGOGASTRODUODENOSCOPY (EGD) WITH PROPOFOL (N/A)  Patient Location: PACU  Anesthesia Type:MAC  Level of Consciousness:  sedated, patient cooperative and responds to stimulation  Airway & Oxygen Therapy:Patient Spontanous Breathing and Patient connected to face mask oxgen  Post-op Assessment:  Report given to PACU RN and Post -op Vital signs reviewed and stable  Post vital signs:  Reviewed and stable  Last Vitals:  Vitals:   09/03/16 0753  BP: (!) 137/49  Resp: 19  Temp: 76.2 C    Complications: No apparent anesthesia complications

## 2016-09-04 ENCOUNTER — Encounter (HOSPITAL_COMMUNITY): Payer: Self-pay | Admitting: Gastroenterology

## 2016-09-17 ENCOUNTER — Ambulatory Visit (INDEPENDENT_AMBULATORY_CARE_PROVIDER_SITE_OTHER): Payer: Medicare Other | Admitting: Oncology

## 2016-09-17 ENCOUNTER — Encounter: Payer: Self-pay | Admitting: Oncology

## 2016-09-17 ENCOUNTER — Other Ambulatory Visit: Payer: Self-pay | Admitting: Oncology

## 2016-09-17 VITALS — BP 157/43 | HR 64 | Temp 97.9°F | Ht 70.0 in | Wt 168.6 lb

## 2016-09-17 DIAGNOSIS — K7460 Unspecified cirrhosis of liver: Secondary | ICD-10-CM | POA: Diagnosis not present

## 2016-09-17 DIAGNOSIS — Z8249 Family history of ischemic heart disease and other diseases of the circulatory system: Secondary | ICD-10-CM

## 2016-09-17 DIAGNOSIS — D732 Chronic congestive splenomegaly: Secondary | ICD-10-CM | POA: Diagnosis not present

## 2016-09-17 DIAGNOSIS — D61818 Other pancytopenia: Secondary | ICD-10-CM | POA: Diagnosis not present

## 2016-09-17 DIAGNOSIS — I8510 Secondary esophageal varices without bleeding: Secondary | ICD-10-CM | POA: Diagnosis not present

## 2016-09-17 DIAGNOSIS — K766 Portal hypertension: Secondary | ICD-10-CM

## 2016-09-17 HISTORY — DX: Chronic congestive splenomegaly: D73.2

## 2016-09-17 HISTORY — DX: Other pancytopenia: D61.818

## 2016-09-17 NOTE — Patient Instructions (Signed)
To lab today Return visit only as needed

## 2016-09-17 NOTE — Progress Notes (Signed)
Hematology and Oncology Follow Up Visit  LAEL PILCH [id number] 07-13-1940 76 y.o. 09/17/2016 9:23 AM   Principle Diagnosis: Encounter Diagnoses  Name Primary?  . Cirrhosis of liver without ascites, unspecified hepatic cirrhosis type ([place]) Yes  . Pancytopenia [facility])   Clinical summary: 76 year old man I  reevaluated in August 2017 for a number of complex issues detailed in my office note. 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 This is all a consequence of a remote mesenteric vascular/portal vein thrombosis which occurred in 1996. He has had an extensive recent evaluation to look for other reasons for cirrhosis which has been negative. I was able to locate the most recent CT scan of his abdomen done to evaluate hematuria which now shows a small shrunken, nodular liver, cavernous transformation in the porta hepatis, large distal esophageal varices, and splenomegaly with spleen 15.5 cm.  I obtained a blood to assess for a underlying myeloproliferative disorder. JAK-2 gene mutations have been associated with portal vein thrombosis. This technology was not available by the time of his clot. The test returned normal with no mutation. He has developed a normochromic anemia in the context of a mild pancytopenia which is also most likely secondary to developing cirrhosis. He does have mild chronic renal insufficiency with estimated GFR 55 mL/m, creatinine 1.2, but an erythropoietin level is elevated at 78.5 done 10/18/2015 which argues against renal insufficiency as a etiology of his anemia. Immunofixation electrophoresis shows normal immunoglobulin levels and no monoclonal protein.  Serum kappa/lambda free light chain ratio is normal which supports medical renal disease and not light chain myeloma or amyloidosis.  Interim History:  Overall he continues to do well. Ultrasound of the abdomen done at time of his last visit with me in October 2017 confirms splenomegaly and early cirrhotic changes in the liver. He had a recent follow-up upper endoscopy on 09/03/2016. He was found to have grade 1 esophageal varices which were not bleeding and unchanged compared with the prior study. Early grade gastroesophageal reflux. He has had no clinical bleeding. He just lost his 15 year old son to pancreatic cancer in April. He and his wife are still grieving.  Medications: reviewed  Allergies: No Known Allergies  Review of Systems: See interim history  Remaining ROS negative:   Physical Exam: Blood pressure (!) 157/43, pulse 64, temperature 97.9 F (36.6 C), temperature source Oral, height 5' 10"  (1.778 m), weight 168 lb 9.6 oz (76.5 kg), SpO2 100 %. Wt Readings from Last 3 Encounters:  09/17/16 168 lb 9.6 oz (76.5 kg)  09/03/16 168 lb (76.2 kg)  07/03/16 167 lb (75.8 kg)     General appearance: Well-nourished Caucasian man HENNT: Pharynx no erythema, exudate, mass, or ulcer. No thyromegaly or thyroid nodules Lymph nodes: No cervical, supraclavicular, or axillary lymphadenopathy Breasts:  Lungs: Clear to auscultation, resonant to percussion throughout Heart: Regular rhythm, no murmur, no gallop, no rub, no click, no edema Abdomen: Soft, nontender, normal bowel sounds, no mass, No hepatomegaly. Spleen palpable 4-5 centimeters below left costal margin unchanged from prior exam Extremities: No edema, no calf tenderness Musculoskeletal: no joint deformities GU:  Vascular: Carotid pulses 2+, no bruits, Neurologic: Alert, oriented, PERRLA,   cranial nerves  grossly normal, motor strength 5 over 5,  reflexes 1+ symmetric, upper body coordination normal, gait normal, Skin: No rash  or ecchymosis  Lab Results: CBC W/Diff    Component Value Date/Time   WBC 4.2 11/21/2015 1002   WBC 4.2 10/02/2015 0722   RBC 3.50 (L) 11/21/2015 1002   RBC 2.97 (L) 10/02/2015 0722   HGB 11.0 (L) 11/21/2015 1002   HGB 12.9 (L) 01/19/2013 0946   HCT 32.9 (L) 11/21/2015 1002   HCT 37.6 (L) 01/19/2013 0946   PLT 48 (LL) 11/21/2015 1002   MCV 94 11/21/2015 1002   MCV 95.3 01/19/2013 0946   MCH 31.4 11/21/2015 1002   MCH 30.6 10/02/2015 0722   MCHC 33.4 11/21/2015 1002   MCHC 32.2 10/02/2015 0722   RDW 16.2 (H) 11/21/2015 1002   RDW 14.7 (H) 01/19/2013 0946   LYMPHSABS 0.9 11/21/2015 1002   LYMPHSABS 1.2 01/19/2013 0946   MONOABS 0.4 09/25/2015 0806   MONOABS 0.5 01/19/2013 0946   EOSABS 0.1 11/21/2015 1002   BASOSABS 0.0 11/21/2015 1002   BASOSABS 0.0 01/19/2013 0946     Chemistry      Component Value Date/Time   NA 140 10/02/2015 0722   NA 140 09/01/2012 0933   K 4.0 10/02/2015 0722   K 4.6 09/01/2012 0933   CL 111 10/02/2015 0722   CO2 24 10/02/2015 0722   CO2 24 09/01/2012 0933   BUN 14 10/02/2015 0722   BUN 16.2 09/01/2012 0933   CREATININE 1.23 10/02/2015 0722   CREATININE 1.1 09/01/2012 0933      Component Value Date/Time   CALCIUM 8.4 (L) 10/02/2015 0722   CALCIUM 9.2 09/01/2012 0933   ALKPHOS 42 09/30/2015 0750   ALKPHOS 55 09/01/2012 0933   AST 25 09/30/2015 0750   AST 27 09/01/2012 0933   ALT 18 09/30/2015 0750   ALT 28 09/01/2012 0933   BILITOT 0.7 09/30/2015 0750   BILITOT 0.91 09/01/2012 0933     Today's lab pending  Radiological Studies: No results found.  Impression:  #1. Indolent development of cirrhosis approximately 20 years status post portal vein thrombosis. No obvious evidence for a myeloproliferative disorder.  #2. Portal hypertension secondary to #1 with associated esophageal varices and splenomegaly.  #3. Pancytopenia secondary to #1 and #2.  Platelet count overall stable in the 48-58,000 range  #4. Erroneous chart entry  with respect to January 2015 right lower extremity DVT. No clear indication for chronic anticoagulation.  I did not schedule a formal follow-up visit but I am happy to see him again if the need arises.  CC: Patient Care Team: Jani Gravel, MD as PCP - General (Internal Medicine) Christin Fudge, MD as Consulting Physician (Surgery) Annia Belt, MD as Consulting Physician (Oncology) Ladene Artist, MD as Consulting Physician (Gastroenterology)   Murriel Hopper, MD, [place]  Hematology-Oncology/Internal Medicine     7/31/20189:23 AM

## 2016-09-18 ENCOUNTER — Telehealth: Payer: Self-pay | Admitting: *Deleted

## 2016-09-18 LAB — COMPREHENSIVE METABOLIC PANEL
ALBUMIN: 3.6 g/dL (ref 3.5–4.8)
ALT: 23 IU/L (ref 0–44)
AST: 32 IU/L (ref 0–40)
Albumin/Globulin Ratio: 1.6 (ref 1.2–2.2)
Alkaline Phosphatase: 50 IU/L (ref 39–117)
BILIRUBIN TOTAL: 0.9 mg/dL (ref 0.0–1.2)
BUN / CREAT RATIO: 17 (ref 10–24)
BUN: 18 mg/dL (ref 8–27)
CALCIUM: 8.9 mg/dL (ref 8.6–10.2)
CO2: 20 mmol/L (ref 20–29)
CREATININE: 1.08 mg/dL (ref 0.76–1.27)
Chloride: 110 mmol/L — ABNORMAL HIGH (ref 96–106)
GFR, EST AFRICAN AMERICAN: 76 mL/min/{1.73_m2} (ref >59)
GFR, EST NON AFRICAN AMERICAN: 65 mL/min/{1.73_m2} (ref >59)
GLUCOSE: 168 mg/dL — AB (ref 65–99)
Globulin, Total: 2.2 g/dL (ref 1.5–4.5)
Potassium: 4.6 mmol/L (ref 3.5–5.2)
Sodium: 144 mmol/L (ref 134–144)
TOTAL PROTEIN: 5.8 g/dL — AB (ref 6.0–8.5)

## 2016-09-18 LAB — CBC WITH DIFFERENTIAL/PLATELET
BASOS ABS: 0 10*3/uL (ref 0.0–0.2)
Basos: 1 %
EOS (ABSOLUTE): 0.1 10*3/uL (ref 0.0–0.4)
Eos: 2 %
HEMATOCRIT: 31.6 % — AB (ref 37.5–51.0)
Hemoglobin: 10.6 g/dL — ABNORMAL LOW (ref 13.0–17.7)
Immature Grans (Abs): 0 10*3/uL (ref 0.0–0.1)
Immature Granulocytes: 0 %
LYMPHS ABS: 0.9 10*3/uL (ref 0.7–3.1)
Lymphs: 23 %
MCH: 32 pg (ref 26.6–33.0)
MCHC: 33.5 g/dL (ref 31.5–35.7)
MCV: 96 fL (ref 79–97)
Monocytes Absolute: 0.3 10*3/uL (ref 0.1–0.9)
Monocytes: 7 %
NEUTROS ABS: 2.7 10*3/uL (ref 1.4–7.0)
Neutrophils: 67 %
PLATELETS: 41 10*3/uL — AB (ref 150–379)
RBC: 3.31 x10E6/uL — ABNORMAL LOW (ref 4.14–5.80)
RDW: 15.2 % (ref 12.3–15.4)
WBC: 4.1 10*3/uL (ref 3.4–10.8)

## 2016-09-18 NOTE — Telephone Encounter (Signed)
-----   Message from Annia Belt, MD sent at 09/18/2016  9:18 AM EDT ----- Call pt: blood counts about the same - just a little lower than last time. Platelets 41,000; were 48,000 in October. Nothing needs to be done. We will send copy to his primary care.

## 2016-09-18 NOTE — Telephone Encounter (Signed)
Pt called / informed " blood counts about the same - just a little lower than last time. Platelets 41,000; were 48,000 in October. Nothing needs to be done. We will send copy to his primary care. " per Dr Beryle Beams. Stated ok and thanks for calling.

## 2016-12-30 DIAGNOSIS — I10 Essential (primary) hypertension: Secondary | ICD-10-CM | POA: Diagnosis not present

## 2016-12-30 DIAGNOSIS — E119 Type 2 diabetes mellitus without complications: Secondary | ICD-10-CM | POA: Diagnosis not present

## 2017-01-06 DIAGNOSIS — E119 Type 2 diabetes mellitus without complications: Secondary | ICD-10-CM | POA: Diagnosis not present

## 2017-01-06 DIAGNOSIS — I10 Essential (primary) hypertension: Secondary | ICD-10-CM | POA: Diagnosis not present

## 2017-01-06 DIAGNOSIS — E7800 Pure hypercholesterolemia, unspecified: Secondary | ICD-10-CM | POA: Diagnosis not present

## 2017-01-14 ENCOUNTER — Telehealth: Payer: Self-pay

## 2017-01-14 DIAGNOSIS — K7460 Unspecified cirrhosis of liver: Secondary | ICD-10-CM

## 2017-01-14 NOTE — Telephone Encounter (Signed)
Patient's wife notified that it is time for his 6 month [place]. Abdominal US has been scheduled for 01/27/17 9:30 at [facility].  Wife notified of the appt dates and time.

## 2017-01-14 NOTE — Telephone Encounter (Signed)
-----   Message from Marlon Pel, RN sent at 07/08/2016  1:20 PM EDT ----- Need [place] - see results 07/08/16

## 2017-01-17 ENCOUNTER — Ambulatory Visit (INDEPENDENT_AMBULATORY_CARE_PROVIDER_SITE_OTHER): Payer: Medicare Other | Admitting: Family

## 2017-01-17 ENCOUNTER — Encounter: Payer: Self-pay | Admitting: Family

## 2017-01-17 ENCOUNTER — Ambulatory Visit (HOSPITAL_COMMUNITY)
Admission: RE | Admit: 2017-01-17 | Discharge: 2017-01-17 | Disposition: A | Discharge status: Home / Self Care | Payer: Medicare Other | Source: Ambulatory Visit | Attending: Vascular Surgery | Admitting: Vascular Surgery

## 2017-01-17 VITALS — BP 143/64 | HR 100 | Temp 97.0°F | Resp 18 | Wt 171.0 lb

## 2017-01-17 DIAGNOSIS — I6523 Occlusion and stenosis of bilateral carotid arteries: Secondary | ICD-10-CM | POA: Diagnosis not present

## 2017-01-17 LAB — VAS US CAROTID
LCCADDIAS: -27 cm/s
LCCAPSYS: 160 cm/s
LEFT ECA DIAS: -25 cm/s
LEFT VERTEBRAL DIAS: -11 cm/s
LICADDIAS: -39 cm/s
LICAPDIAS: -43 cm/s
LICAPSYS: -178 cm/s
Left CCA dist sys: -120 cm/s
Left CCA prox dias: 29 cm/s
Left ICA dist sys: -140 cm/s
RIGHT CCA MID DIAS: -19 cm/s
RIGHT ECA DIAS: -22 cm/s
RIGHT VERTEBRAL DIAS: -18 cm/s
Right CCA prox dias: 20 cm/s
Right CCA prox sys: 151 cm/s
Right cca dist sys: -134 cm/s

## 2017-01-17 NOTE — Patient Instructions (Signed)

## 2017-01-17 NOTE — Progress Notes (Signed)
Chief Complaint: Follow up Extracranial Carotid Artery Stenosis   History of Present Illness  [name] is a 76 y.o. male patient whom Dr. 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 He has had no significant stenosis in the left ICA.   He had a "mild" stroke about 2005 which manifested as dizziness and bilateral arms weakness, but no stroke or TIA symptoms since then, states it was noted on either CT or MRI which also found the carotid artery stenosis.   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 Lajoyce Corners, he had no stents or surgeries for this.  Since he was on the anticoagulants, he has developed low platelets which is managed by Dr. Beryle Beams.  Patient states he has not had any stomach pain since treated for the mesenteric vein thrombosis and his weight has remained the same since treated for this.  He did lose weight before mesenteric vein thrombosis treatment, but gained it back afterward.  Patient denies any cardiac problems or dyspnea.  The patient denies a history of amaurosis fugax or monocular blindness, unilateral facial drooping, hemiplegia, or receptive or expressive aphasia.  He denies claudication symptoms with walking. He lost his sense of taste since early 2016, seeing Dr. Jannifer Franklin, neurologist, re this. He was hospitalized at [facility] January, 2015 for cellulitis and DVT in RLE and ulceration which wife states has healed.  He does not have any clotting disorders that he is aware of.  He has been diagnosed with cirrhosis and the gastroenterologist was recommending discontinuing his Coumadin which Dr. Scot Dock agrees with.  Dr. Scot Dock last evaluated pt on 07-03-16. At that time the patient had a stable 60-79% right carotid stenosis. The end-diastolic  velocity was 79 cm/s suggesting this was in the mid range. He was asymptomatic. He is on a statin. Dr. Scot Dock would favor discontinuing his Coumadin and starting him on aspirin 81 mg daily. Pt was to follow in 6 months with a carotid duplex and we will see him back at that time. Given that the plaque has been stable he was to follow up with our nurse practitioner or physician's assistant.  Pt states 120-127/68 is what his blood pressure runs at home.   Pt Diabetic: Yes, states his last A1C was 6.7 Pt smoker: non-smoker   Pt meds include:  Statin : Yes  Betablocker: No  ASA: 81 mg daily Other anticoagulants/antiplatelets: no    Past Medical History:  Diagnosis Date  . Anemia   . Arthritis    "hands" (09/27/2015)  . Carotid artery occlusion   . Cellulitis and abscess of leg 03/19/2013   RIGHT LEG HEALED  . 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 Cirrhosis of liver ([place])   . Clotting disorder ([place])   . Colon polyps   . CVA (cerebral vascular accident) ([place]) ~ 2000   "minor stroke", denies residual on 09/27/2015  . DVT (deep venous thrombosis) ([place]) 03/20/2013   RIGHT LEG AND LEFT LEG  . DVT (deep venous thrombosis) ([place]) 02/2013   RLE  . Esophageal varices ([place])   . Essential hypertension, benign 03/20/2013  . History of blood transfusion 1998   "blood clot began bleeding"  . Hyperlipidemia   . 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 Leukopenia 08/11/2012  . Mesenteric  venous thrombosis 1996  . Mitral regurgitation   . Pancytopenia, acquired ([place]) 09/17/2016   Due to non alcoholic cirhosis  . Splenomegaly   . Type II diabetes mellitus ([place]) dx'd in the 1990s    Social History Social History   Tobacco Use  . Smoking status: Never Smoker  . Smokeless tobacco: Never Used  Substance Use Topics  . Alcohol use: No    Alcohol/week: 0.0 oz  . Drug use: No    Family History Family History  Problem Relation Age of Onset  .  Alzheimer's disease Father   . Dementia Father   . Cancer Sister        unknown type  . Diabetes Sister   . Deep vein thrombosis Sister        Varicose vein  . Heart disease Mother        After age 63  . Hypertension Mother   . Pancreatic cancer Son     Surgical History Past Surgical History:  Procedure Laterality Date  . CATARACT EXTRACTION W/ INTRAOCULAR LENS IMPLANT Right 06/26/2012  . CATARACT EXTRACTION W/ INTRAOCULAR LENS IMPLANT Left 07/27/2012  . COLONOSCOPY    . ESOPHAGOGASTRODUODENOSCOPY (EGD) WITH PROPOFOL N/A 09/03/2016   Procedure: ESOPHAGOGASTRODUODENOSCOPY (EGD) WITH PROPOFOL;  Surgeon: Ladene Artist, MD;  Location: [facility] ENDOSCOPY;  Service: Endoscopy;  Laterality: N/A;    No Known Allergies  Current Outpatient Medications  Medication Sig Dispense Refill  . aspirin EC 81 MG tablet Take 1 tablet (81 mg total) by mouth daily. 150 tablet 2  . atorvastatin (LIPITOR) 40 MG tablet Take 40 mg by mouth every evening.     . calcium carbonate (TUMS - DOSED IN MG ELEMENTAL CALCIUM) 500 MG chewable tablet Chew 1 tablet by mouth daily as needed for indigestion or heartburn.    . ferrous sulfate 325 (65 FE) MG EC tablet Take 1 tablet (325 mg total) by mouth 2 (two) times daily with a meal. 60 tablet 3  . glipiZIDE (GLUCOTROL) 10 MG tablet Take 10 mg by mouth 2 (two) times daily.     [name] losartan (COZAAR) 50 MG tablet Take 50 mg by mouth daily.     . Lutein 6 MG TABS Take 6 mg by mouth daily.    . metFORMIN (GLUCOPHAGE) 1000 MG tablet Take 1,000 mg by mouth 2 (two) times daily with a meal.    . OVER THE COUNTER MEDICATION Take 6 mg by mouth daily. Zeaxanthin 9m otc supplement    . Polyvinyl Alcohol-Povidone (REFRESH OP) Apply 1 drop to eye daily as needed (dry eyes).    . saxagliptin HCl (ONGLYZA) 5 MG TABS tablet Take 5 mg by mouth every evening.      No current facility-administered medications for this visit.     Review of Systems : See HPI for pertinent positives and  negatives.  Physical Examination  Vitals:   01/17/17 1159 01/17/17 1318  BP: (!) 150/65 (!) 143/64  Pulse: 100   Resp: 18   Temp: (!) 97 F (36.1 C)   TempSrc: Oral   SpO2: 98%   Weight: 171 lb (77.6 kg)    Body mass index is 24.54 kg/m.  General: WDWN male in NAD  GAIT: slight limp  Eyes: PERRLA  Pulmonary: Respirations are non-labored, CTAB, no rales, no rhonchi, & no wheezing.  Cardiac: regular rhythm with frequent missed contractions and premature contractions, no detected murmur.   VASCULAR EXAM  Carotid Bruits  Left  Right    Positive  Positive   Abdominal aortic pulse is not palpable.  Radial pulses are 2+ palpable and equal.   LE Pulses  LEFT  RIGHT   POPLITEAL  not palpable  not palpable   POSTERIOR TIBIAL  palpable  not palpable   DORSALIS PEDIS palpable palpable   Gastrointestinal: soft, nontender, BS WNL, no r/g, no palpable masses .  Musculoskeletal: No muscle atrophy/wasting. M/S 5/5 throughout except 4/5 RLE, Extremities without ischemic changes. 1-2+ pitting edema in ankles and pretibial.   Skin: No rash, no ulcers, no cellulitis.   Neurologic: A&O X 3; Appropriate Affect,speech is normal, CN 2-12 intact, Pain and light touch intact in extremities, some hearing loss, Motor exam as listed above.    Assessment: KAELYN NAUTA is a 76 y.o. male who had a stroke in 2005, has no residual neurological deficits, and has had no subsequent stroke or TIA.  Cardiac ausculation: baseline regular rhythm with frequent missed contractions and premature contractions, but pt is asymptomatic of this: he denies any more dyspnea than usual, denies chest pain, denies feeling light headed, denies palpitations. He will discuss this with his PCP. He does not see a cardiologist.   DATA Carotid Duplex (01/17/17): Right ICA: 60-79% stenosis. Left ICA: 40-59% stenosis. Bilateral vertebral artery flow is antegrade.  Bilateral  subclavian artery waveforms are normal.  Slight increase in the left ICA stenosis since the prior exam on 07-03-16.   Plan: Follow-up in 6 months with Carotid Duplex scan.    I discussed in depth with the patient the nature of atherosclerosis, and emphasized the importance of maximal medical management including strict control of blood pressure, blood glucose, and lipid levels, obtaining regular exercise, and continued cessation of smoking.  The patient is aware that without maximal medical management the underlying atherosclerotic disease process will progress, limiting the benefit of any interventions. The patient was given information about stroke prevention and what symptoms should prompt the patient to seek immediate medical care. Thank you for allowing [place] to participate in this patient's care.  Clemon Chambers, RN, MSN, FNP-C [facility] Office: [phone]  Clinic Physician: Donzetta Matters on call  01/17/17 7:21 PM

## 2017-01-27 ENCOUNTER — Ambulatory Visit (HOSPITAL_COMMUNITY): Payer: Medicare Other

## 2017-01-30 NOTE — Addendum Note (Signed)
Addended by: Lianne Cure A on: 01/30/2017 04:28 PM   Modules accepted: Orders

## 2017-02-04 ENCOUNTER — Ambulatory Visit (HOSPITAL_COMMUNITY)
Admission: RE | Admit: 2017-02-04 | Discharge: 2017-02-04 | Disposition: A | Discharge status: Home / Self Care | Payer: Medicare Other | Source: Ambulatory Visit | Attending: Gastroenterology | Admitting: Gastroenterology

## 2017-02-04 ENCOUNTER — Encounter: Payer: Self-pay | Admitting: Gastroenterology

## 2017-02-04 DIAGNOSIS — K7460 Unspecified cirrhosis of liver: Secondary | ICD-10-CM | POA: Diagnosis not present

## 2017-02-04 DIAGNOSIS — K824 Cholesterolosis of gallbladder: Secondary | ICD-10-CM | POA: Insufficient documentation

## 2017-03-31 DIAGNOSIS — I10 Essential (primary) hypertension: Secondary | ICD-10-CM | POA: Diagnosis not present

## 2017-04-09 DIAGNOSIS — R5383 Other fatigue: Secondary | ICD-10-CM | POA: Diagnosis not present

## 2017-04-09 DIAGNOSIS — E119 Type 2 diabetes mellitus without complications: Secondary | ICD-10-CM | POA: Diagnosis not present

## 2017-04-09 DIAGNOSIS — E7800 Pure hypercholesterolemia, unspecified: Secondary | ICD-10-CM | POA: Diagnosis not present

## 2017-04-09 DIAGNOSIS — I10 Essential (primary) hypertension: Secondary | ICD-10-CM | POA: Diagnosis not present

## 2017-04-09 DIAGNOSIS — Z125 Encounter for screening for malignant neoplasm of prostate: Secondary | ICD-10-CM | POA: Diagnosis not present

## 2017-07-15 ENCOUNTER — Encounter: Payer: Self-pay | Admitting: Family

## 2017-07-15 ENCOUNTER — Ambulatory Visit: Payer: Medicare Other | Admitting: Family

## 2017-07-15 ENCOUNTER — Ambulatory Visit (HOSPITAL_COMMUNITY)
Admission: RE | Admit: 2017-07-15 | Discharge: 2017-07-15 | Disposition: A | Discharge status: Home / Self Care | Payer: Medicare Other | Source: Ambulatory Visit | Attending: Family | Admitting: Family

## 2017-07-15 VITALS — BP 132/60 | HR 61 | Temp 97.3°F | Resp 18 | Ht 70.0 in | Wt 165.7 lb

## 2017-07-15 DIAGNOSIS — Z8673 Personal history of transient ischemic attack (TIA), and cerebral infarction without residual deficits: Secondary | ICD-10-CM | POA: Diagnosis not present

## 2017-07-15 DIAGNOSIS — I6523 Occlusion and stenosis of bilateral carotid arteries: Secondary | ICD-10-CM | POA: Insufficient documentation

## 2017-07-15 NOTE — Patient Instructions (Signed)

## 2017-07-15 NOTE — Progress Notes (Signed)
Chief Complaint: Follow up Extracranial Carotid Artery Stenosis   History of Present Illness  [name] is a 77 y.o. male whom Dr. Scot Dock has been monitroing for extracranial carotid artery stenosis which is been relatively stable.   He had a "mild" stroke about 2005 which manifested as dizziness and bilateral arms weakness, but no stroke or TIA symptoms since then, states it was noted on either CT or MRI which also found the carotid artery stenosis.  He still has balance issues if he turns too quickly.   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 Lajoyce Corners, he had no stents or surgeries for this.  Since he was on the anticoagulants, he has developed low platelets which is managed by Dr. Beryle Beams.  Patient states he has not had any stomach pain since treated for the mesenteric vein thrombosis.[name]  He did lose weight before mesenteric vein thrombosis treatment, but gained it back afterward.  Patient denies any cardiac problems or dyspnea.  The patient denies a history of amaurosis fugax or monocular blindness, unilateral facial drooping, hemiplegia, or receptive or expressive aphasia.  He denies claudication symptoms with walking. He lost his sense of taste since early 2016, seeing Dr. Jannifer Franklin, neurologist, re this. Wife states he is not eating as well as he should due to this. Pt denies post prandial abdominal pain.   He was hospitalized at [facility] January, 2015 for cellulitis and DVT in RLE and ulceration which wife states has healed.  He does not have any clotting disorders that he is aware of.  He has been diagnosed with cirrhosis and his gastroenterologist was recommending discontinuing his Coumadin which Dr. Scot Dock agrees with.  Dr. Scot Dock last evaluated pt on 07-03-16. At that time the patient had a stable 60-79% right  carotid stenosis. The end-diastolic velocity was 79 cm/s suggesting this was in the mid range. He was asymptomatic. He is on a statin. Dr. Scot Dock would favor discontinuing his Coumadin and starting him on aspirin 81 mg daily. Pt was to follow in 6 months with a carotid duplex and we will see him back at that time.    Diabetic: Yes, states his last A1C was 6.? Tobacco use: non-smoker  Pt meds include:  Statin : Yes  Betablocker: No  ASA: 81 mg daily Other anticoagulants/antiplatelets: no    Past Medical History:  Diagnosis Date  . Anemia   . Arthritis    "hands" (09/27/2015)  . Carotid artery occlusion   . Cellulitis and abscess of leg 03/19/2013   RIGHT LEG HEALED  . 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 Cirrhosis of liver ([place])   . Clotting disorder ([place])   . Colon polyps   . CVA (cerebral vascular accident) ([place]) ~ 2000   "minor stroke", denies residual on 09/27/2015  . DVT (deep venous thrombosis) ([place]) 03/20/2013   RIGHT LEG AND LEFT LEG  . DVT (deep venous thrombosis) ([place]) 02/2013   RLE  . Esophageal varices ([place])   . Essential hypertension, benign 03/20/2013  . History of blood transfusion 1998   "blood clot began bleeding"  . Hyperlipidemia   . 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 Leukopenia 08/11/2012  . Mesenteric venous thrombosis 1996  . Mitral regurgitation   . Pancytopenia, acquired ([place]) 09/17/2016   Due to non alcoholic cirhosis  . Splenomegaly   .  Type II diabetes mellitus ([place]) dx'd in the 1990s    Social History Social History   Tobacco Use  . Smoking status: Never Smoker  . Smokeless tobacco: Never Used  Substance Use Topics  . Alcohol use: No    Alcohol/week: 0.0 oz  . Drug use: No    Family History Family History  Problem Relation Age of Onset  . Alzheimer's disease Father   . Dementia Father   . Cancer Sister        unknown type  . Diabetes Sister   . Deep vein thrombosis Sister         Varicose vein  . Heart disease Mother        After age 24  . Hypertension Mother   . Pancreatic cancer Son     Surgical History Past Surgical History:  Procedure Laterality Date  . CATARACT EXTRACTION W/ INTRAOCULAR LENS IMPLANT Right 06/26/2012  . CATARACT EXTRACTION W/ INTRAOCULAR LENS IMPLANT Left 07/27/2012  . COLONOSCOPY    . ESOPHAGOGASTRODUODENOSCOPY (EGD) WITH PROPOFOL N/A 09/03/2016   Procedure: ESOPHAGOGASTRODUODENOSCOPY (EGD) WITH PROPOFOL;  Surgeon: Ladene Artist, MD;  Location: [facility];  Service: Endoscopy;  Laterality: N/A;    No Known Allergies  Current Outpatient Medications  Medication Sig Dispense Refill  . atorvastatin (LIPITOR) 40 MG tablet Take 40 mg by mouth every evening.     . calcium carbonate (TUMS - DOSED IN MG ELEMENTAL CALCIUM) 500 MG chewable tablet Chew 1 tablet by mouth daily as needed for indigestion or heartburn.    . ferrous sulfate 325 (65 FE) MG EC tablet Take 1 tablet (325 mg total) by mouth 2 (two) times daily with a meal. 60 tablet 3  . glipiZIDE (GLUCOTROL) 10 MG tablet Take 10 mg by mouth 2 (two) times daily.     [name] losartan (COZAAR) 50 MG tablet Take 50 mg by mouth daily.     . Lutein 6 MG TABS Take 6 mg by mouth daily.    . metFORMIN (GLUCOPHAGE) 1000 MG tablet Take 1,000 mg by mouth 2 (two) times daily with a meal.    . OVER THE COUNTER MEDICATION Take 6 mg by mouth daily. Zeaxanthin 15m otc supplement    . Polyvinyl Alcohol-Povidone (REFRESH OP) Apply 1 drop to eye daily as needed (dry eyes).    . saxagliptin HCl (ONGLYZA) 5 MG TABS tablet Take 5 mg by mouth every evening.      No current facility-administered medications for this visit.     Review of Systems : See HPI for pertinent positives and negatives.  Physical Examination  Vitals:   07/15/17 0937 07/15/17 0940  BP: (!) 118/59 132/60  Pulse: 60 61  Resp: 18   Temp: (!) 97.3 F (36.3 C)   TempSrc: Oral   SpO2: 100%   Weight: 165 lb 11.2 oz (75.2 kg)    Height: 5' 10"  (1.778 m)    Body mass index is 23.78 kg/m.  General: WDWN male in NAD  GAIT:slight limp  HENT: no gross abnormalities  Eyes: PERRLA  Pulmonary: Respirations are non-labored, CTAB, no rales, rhonchi, or wheezing.  Cardiac: Irregular rhythm with controlled rate , no detected murmur.   VASCULAR EXAM Carotid Bruits Left Right   Positive negative   Abdominal aortic pulse is not palpable.  Radial pulses are 2+ palpable and equal.   LE Pulses  LEFT  RIGHT   POPLITEAL  not palpable  not palpable  POSTERIOR TIBIAL  Not palpable  not palpable  DORSALIS PEDIS palpable palpable   Gastrointestinal: soft, nontender, BS WNL, no r/g, no palpable masses Musculoskeletal: No muscle atrophy/wasting. M/S 5/5 throughout except 4/5 RLE, Extremities without ischemic changes.  No peripheral edema.  Skin: No rash, no ulcers, no cellulitis.  Neurologic: A&O X 3; Appropriate Affect,speech is normal, CN 2-12 intact, Pain and light touch intact in extremities, some hearing loss, Motor exam as listed above. Psychiatric: Normal thought content, mood appropriate to clinical situation.    Assessment: [name] is a 77 y.o. male who had a stroke in 2005, has mild residual neurological deficits, and has had no subsequent stroke or TIA.  Irregular cardiac rhythm. No dyspnea or chest pain. Pt had an irregular cardiac rhythm at his last visit and I advised him to discuss this with his PCP.  DATA Carotid Duplex (07-15-17): Right ICA: 60-79% stenosis. Left ICA: 40-59% stenosis. Bilateral vertebral artery flow is antegrade.  Bilateral subclavian artery waveforms are normal.  No change since the prior exam on 01-17-17.   Plan:  Follow-up in 28monthwith Carotid Duplex scan.    I discussed in depth with the patient the nature of atherosclerosis, and emphasized the importance of maximal medical management including strict control of  blood pressure, blood glucose, and lipid levels, obtaining regular exercise, and continued cessation of smoking.  The patient is aware that without maximal medical management the underlying atherosclerotic disease process will progress, limiting the benefit of any interventions. The patient was given information about stroke prevention and what symptoms should prompt the patient to seek immediate medical care. Thank you for allowing [facility] participate in this patient's care.  SClemon Chambers RN, MSN, FNP-C [facility] [place]iaOffice: [phone] Clinic Physician: Early  07/15/17 9:56 AM

## 2017-07-24 ENCOUNTER — Telehealth: Payer: Self-pay

## 2017-07-24 ENCOUNTER — Other Ambulatory Visit: Payer: Self-pay

## 2017-07-24 DIAGNOSIS — I8500 Esophageal varices without bleeding: Secondary | ICD-10-CM

## 2017-07-24 NOTE — Telephone Encounter (Signed)
Spoke with patient to inform him it was time to schedule his recall EGD at [facility] for esophageal varices screening. Patient verbalized understanding.

## 2017-07-24 NOTE — Telephone Encounter (Signed)
Informed patient EGD is scheduled for 10/14/17 at [facility] at 8:30am, arriving at 7:00am. Also, I am mailing them instructions regarding the procedure and if they did do not receive them or have questions to please call the office and ask for [facility]. Patient verbalized understanding.

## 2017-07-31 DIAGNOSIS — D649 Anemia, unspecified: Secondary | ICD-10-CM | POA: Diagnosis not present

## 2017-07-31 DIAGNOSIS — E119 Type 2 diabetes mellitus without complications: Secondary | ICD-10-CM | POA: Diagnosis not present

## 2017-07-31 DIAGNOSIS — D638 Anemia in other chronic diseases classified elsewhere: Secondary | ICD-10-CM | POA: Diagnosis not present

## 2017-07-31 DIAGNOSIS — I10 Essential (primary) hypertension: Secondary | ICD-10-CM | POA: Diagnosis not present

## 2017-07-31 DIAGNOSIS — R5383 Other fatigue: Secondary | ICD-10-CM | POA: Diagnosis not present

## 2017-08-01 ENCOUNTER — Other Ambulatory Visit: Payer: Self-pay

## 2017-08-01 DIAGNOSIS — I6523 Occlusion and stenosis of bilateral carotid arteries: Secondary | ICD-10-CM

## 2017-08-07 DIAGNOSIS — D649 Anemia, unspecified: Secondary | ICD-10-CM | POA: Diagnosis not present

## 2017-08-07 DIAGNOSIS — D696 Thrombocytopenia, unspecified: Secondary | ICD-10-CM | POA: Diagnosis not present

## 2017-08-07 DIAGNOSIS — E7800 Pure hypercholesterolemia, unspecified: Secondary | ICD-10-CM | POA: Diagnosis not present

## 2017-08-07 DIAGNOSIS — E119 Type 2 diabetes mellitus without complications: Secondary | ICD-10-CM | POA: Diagnosis not present

## 2017-08-07 DIAGNOSIS — I10 Essential (primary) hypertension: Secondary | ICD-10-CM | POA: Diagnosis not present

## 2017-10-14 ENCOUNTER — Encounter (HOSPITAL_COMMUNITY): Payer: Self-pay

## 2017-10-14 ENCOUNTER — Other Ambulatory Visit: Payer: Self-pay

## 2017-10-14 ENCOUNTER — Ambulatory Visit (HOSPITAL_COMMUNITY): Payer: Medicare Other | Admitting: Anesthesiology

## 2017-10-14 ENCOUNTER — Ambulatory Visit (HOSPITAL_COMMUNITY)
Admission: RE | Admit: 2017-10-14 | Discharge: 2017-10-14 | Disposition: A | Discharge status: Home / Self Care | Payer: Medicare Other | Source: Ambulatory Visit | Attending: Gastroenterology | Admitting: Gastroenterology

## 2017-10-14 ENCOUNTER — Encounter (HOSPITAL_COMMUNITY)
Admission: RE | Disposition: A | Discharge status: Home / Self Care | Payer: Self-pay | Source: Ambulatory Visit | Attending: Gastroenterology

## 2017-10-14 DIAGNOSIS — I81 Portal vein thrombosis: Secondary | ICD-10-CM | POA: Insufficient documentation

## 2017-10-14 DIAGNOSIS — R011 Cardiac murmur, unspecified: Secondary | ICD-10-CM | POA: Diagnosis not present

## 2017-10-14 DIAGNOSIS — K210 Gastro-esophageal reflux disease with esophagitis: Secondary | ICD-10-CM | POA: Insufficient documentation

## 2017-10-14 DIAGNOSIS — K766 Portal hypertension: Secondary | ICD-10-CM | POA: Insufficient documentation

## 2017-10-14 DIAGNOSIS — Z7984 Long term (current) use of oral hypoglycemic drugs: Secondary | ICD-10-CM | POA: Insufficient documentation

## 2017-10-14 DIAGNOSIS — I739 Peripheral vascular disease, unspecified: Secondary | ICD-10-CM | POA: Insufficient documentation

## 2017-10-14 DIAGNOSIS — E1151 Type 2 diabetes mellitus with diabetic peripheral angiopathy without gangrene: Secondary | ICD-10-CM | POA: Diagnosis not present

## 2017-10-14 DIAGNOSIS — K3189 Other diseases of stomach and duodenum: Secondary | ICD-10-CM | POA: Insufficient documentation

## 2017-10-14 DIAGNOSIS — R161 Splenomegaly, not elsewhere classified: Secondary | ICD-10-CM | POA: Insufficient documentation

## 2017-10-14 DIAGNOSIS — M19041 Primary osteoarthritis, right hand: Secondary | ICD-10-CM | POA: Diagnosis not present

## 2017-10-14 DIAGNOSIS — K7460 Unspecified cirrhosis of liver: Secondary | ICD-10-CM | POA: Diagnosis not present

## 2017-10-14 DIAGNOSIS — Z86718 Personal history of other venous thrombosis and embolism: Secondary | ICD-10-CM | POA: Diagnosis not present

## 2017-10-14 DIAGNOSIS — Z7982 Long term (current) use of aspirin: Secondary | ICD-10-CM | POA: Insufficient documentation

## 2017-10-14 DIAGNOSIS — K7581 Nonalcoholic steatohepatitis (NASH): Secondary | ICD-10-CM | POA: Diagnosis not present

## 2017-10-14 DIAGNOSIS — Z8249 Family history of ischemic heart disease and other diseases of the circulatory system: Secondary | ICD-10-CM | POA: Diagnosis not present

## 2017-10-14 DIAGNOSIS — Z8673 Personal history of transient ischemic attack (TIA), and cerebral infarction without residual deficits: Secondary | ICD-10-CM | POA: Insufficient documentation

## 2017-10-14 DIAGNOSIS — I8500 Esophageal varices without bleeding: Secondary | ICD-10-CM

## 2017-10-14 DIAGNOSIS — I340 Nonrheumatic mitral (valve) insufficiency: Secondary | ICD-10-CM | POA: Diagnosis not present

## 2017-10-14 DIAGNOSIS — I8510 Secondary esophageal varices without bleeding: Secondary | ICD-10-CM | POA: Diagnosis not present

## 2017-10-14 DIAGNOSIS — M19042 Primary osteoarthritis, left hand: Secondary | ICD-10-CM | POA: Diagnosis not present

## 2017-10-14 DIAGNOSIS — Z86010 Personal history of colonic polyps: Secondary | ICD-10-CM | POA: Diagnosis not present

## 2017-10-14 DIAGNOSIS — K219 Gastro-esophageal reflux disease without esophagitis: Secondary | ICD-10-CM | POA: Diagnosis present

## 2017-10-14 DIAGNOSIS — I10 Essential (primary) hypertension: Secondary | ICD-10-CM | POA: Insufficient documentation

## 2017-10-14 DIAGNOSIS — Z79899 Other long term (current) drug therapy: Secondary | ICD-10-CM | POA: Insufficient documentation

## 2017-10-14 HISTORY — PX: ESOPHAGOGASTRODUODENOSCOPY (EGD) WITH PROPOFOL: SHX5813

## 2017-10-14 HISTORY — PX: BIOPSY: SHX5522

## 2017-10-14 LAB — GLUCOSE, CAPILLARY: Glucose-Capillary: 108 mg/dL — ABNORMAL HIGH (ref 70–99)

## 2017-10-14 SURGERY — ESOPHAGOGASTRODUODENOSCOPY (EGD) WITH PROPOFOL
Anesthesia: Monitor Anesthesia Care

## 2017-10-14 MED ORDER — PROPOFOL 10 MG/ML IV BOLUS
INTRAVENOUS | Status: AC
  Filled 2017-10-14: qty 40

## 2017-10-14 MED ORDER — OMEPRAZOLE 40 MG PO CPDR: 40.0000 mg | DELAYED_RELEASE_CAPSULE | Freq: Every day | ORAL | 11 refills | Status: DC

## 2017-10-14 MED ORDER — LIDOCAINE 2% (20 MG/ML) 5 ML SYRINGE
INTRAMUSCULAR | Status: DC | PRN
  Administered 2017-10-14: 100 mg via INTRAVENOUS

## 2017-10-14 MED ORDER — PROPOFOL 10 MG/ML IV BOLUS
INTRAVENOUS | Status: DC | PRN
  Administered 2017-10-14: 40 mg via INTRAVENOUS

## 2017-10-14 MED ORDER — PROPOFOL 500 MG/50ML IV EMUL
INTRAVENOUS | Status: DC | PRN
  Administered 2017-10-14: 125 ug/kg/min via INTRAVENOUS

## 2017-10-14 MED ORDER — SODIUM CHLORIDE 0.9 % IV SOLN: INTRAVENOUS | Status: DC

## 2017-10-14 MED ORDER — LACTATED RINGERS IV SOLN
INTRAVENOUS | Status: DC
Start: 2017-10-14 — End: 2017-10-14
  Administered 2017-10-14: 1000 mL via INTRAVENOUS

## 2017-10-14 SURGICAL SUPPLY — 15 items

## 2017-10-14 NOTE — Discharge Instructions (Signed)
YOU HAD AN ENDOSCOPIC PROCEDURE TODAY: Refer to the procedure report and other information in the discharge instructions given to you for any specific questions about what was found during the examination. If this information does not answer your questions, please call [place] office at [phone] to clarify.   YOU SHOULD EXPECT: Some feelings of bloating in the abdomen. Passage of more gas than usual. Walking can help get rid of the air that was put into your GI tract during the procedure and reduce the bloating. If you had a lower endoscopy (such as a colonoscopy or flexible sigmoidoscopy) you may notice spotting of blood in your stool or on the toilet paper. Some abdominal soreness may be present for a day or two, also.  DIET: Your first meal following the procedure should be a light meal and then it is ok to progress to your normal diet. A half-sandwich or bowl of soup is an example of a good first meal. Heavy or fried foods are harder to digest and may make you feel nauseous or bloated. Drink plenty of fluids but you should avoid alcoholic beverages for 24 hours. If you had a esophageal dilation, please see attached instructions for diet.    ACTIVITY: Your care partner should take you home directly after the procedure. You should plan to take it easy, moving slowly for the rest of the day. You can resume normal activity the day after the procedure however YOU SHOULD NOT DRIVE, use power tools, machinery or perform tasks that involve climbing or major physical exertion for 24 hours (because of the sedation medicines used during the test).   SYMPTOMS TO REPORT IMMEDIATELY: A gastroenterologist can be reached at any hour. Please call [phone]  for any of the following symptoms:   Following upper endoscopy (EGD, EUS, ERCP, esophageal dilation) Vomiting of blood or coffee ground material  New, significant abdominal pain  New, significant chest pain or pain under the shoulder blades  Painful or  persistently difficult swallowing  New shortness of breath  Black, tarry-looking or red, bloody stools  FOLLOW UP:  If any biopsies were taken you will be contacted by phone or by letter within the next 1-3 weeks. Call [phone]  if you have not heard about the biopsies in 3 weeks.  Please also call with any specific questions about appointments or follow up tests.  

## 2017-10-14 NOTE — Transfer of Care (Signed)
Immediate Anesthesia Transfer of Care Note  Patient: [name]  Procedure(s) Performed: ESOPHAGOGASTRODUODENOSCOPY (EGD) WITH PROPOFOL (N/A ) BIOPSY  Patient Location: PACU and Endoscopy Unit  Anesthesia Type:MAC  Level of Consciousness: awake, alert  and oriented  Airway & Oxygen Therapy: Patient Spontanous Breathing and Patient connected to nasal cannula oxygen  Post-op Assessment: Report given to RN and Post -op Vital signs reviewed and stable  Post vital signs: Reviewed and stable  Last Vitals:  Vitals Value Taken Time  BP    Temp    Pulse 64 10/14/2017  8:45 AM  Resp 23 10/14/2017  8:45 AM  SpO2 100 % 10/14/2017  8:45 AM  Vitals shown include unvalidated device data.  Last Pain:  Vitals:   10/14/17 0720  TempSrc: Oral  PainSc: 0-No pain         Complications: No apparent anesthesia complications

## 2017-10-14 NOTE — Op Note (Addendum)
[facility] Patient Name: [name] Procedure Date: 10/14/2017 MRN: [id number] Attending MD: Ladene Artist , MD Date of Birth: 06-Feb-1941 CSN: [id number] Age: 77 Admit Type: Outpatient Procedure:                Upper GI endoscopy Indications:              Surveillance procedure, Suspected gastro-esophageal                            reflux disease Providers:                Pricilla Riffle. Fuller Plan, MD, Vista Lawman, RN, Cletis Athens, Technician, Cherylynn Ridges, Technician Referring MD:              Medicines:                Monitored Anesthesia Care Complications:            No immediate complications. Estimated Blood Loss:     Estimated blood loss was minimal. Procedure:                Pre-Anesthesia Assessment:                           - Prior to the procedure, a History and Physical                            was performed, and patient medications and                            allergies were reviewed. The patient's tolerance of                            previous anesthesia was also reviewed. The risks                            and benefits of the procedure and the sedation                            options and risks were discussed with the patient.                            All questions were answered, and informed consent                            was obtained. Prior Anticoagulants: The patient has                            taken no previous anticoagulant or antiplatelet                            agents. ASA Grade Assessment: III - A patient with  severe systemic disease. After reviewing the risks                            and benefits, the patient was deemed in                            satisfactory condition to undergo the procedure.                           After obtaining informed consent, the endoscope was                            passed under direct vision. Throughout the                             procedure, the patient's blood pressure, pulse, and                            oxygen saturations were monitored continuously. The                            GIF-H190 ([id number]) Olympus adult endoscope was                            introduced through the mouth, and advanced to the                            second part of duodenum. The upper GI endoscopy was                            accomplished without difficulty. The patient                            tolerated the procedure well. Scope In: Scope Out: Findings:      LA Grade A (one or more mucosal breaks less than 5 mm, not extending       between tops of 2 mucosal folds) esophagitis with no bleeding was found       at the gastroesophageal junction.      Grade I varices were found in the distal esophagus. They were 4 mm in       largest diameter.      The exam of the esophagus was otherwise normal.      Mild portal hypertensive gastropathy was found in the entire examined       stomach. A few erosion in the antrum Biopsies were taken with a cold       forceps for histology in the antrum and body.      The duodenal bulb and second portion of the duodenum were normal. Impression:               - LA Grade A reflux esophagitis.                           - Grade I esophageal varices.                           -  Portal hypertensive gastropathy. Biopsied.                           - Erosive gastritis. Biopsied.                           - Normal duodenal bulb and second portion of the                            duodenum. Moderate Sedation:      N/A- Per Anesthesia Care Recommendation:           - Patient has a contact number available for                            emergencies. The signs and symptoms of potential                            delayed complications were discussed with the                            patient. Return to normal activities tomorrow.                            Written discharge instructions were provided to the                             patient.                           - Resume previous diet.                           - Antireflux measures.                           - Continue present medications.                           - Await pathology results.                           - Prilosec (omeprazole) 40 mg PO daily.                           - Repeat upper endoscopy in 1 year for surveillance. Procedure Code(s):        --- Professional ---                           [phone], Esophagogastroduodenoscopy, flexible,                            transoral; with biopsy, single or multiple Diagnosis Code(s):        --- Professional ---                           K21.0, Gastro-esophageal reflux disease with  esophagitis                           I85.00, Esophageal varices without bleeding                           K76.6, Portal hypertension                           K31.89, Other diseases of stomach and duodenum CPT copyright 2017 [employer]. All rights reserved. The codes documented in this report are preliminary and upon coder review may  be revised to meet current compliance requirements. Ladene Artist, MD 10/14/2017 8:45:26 AM This report has been signed electronically. Number of Addenda: 0

## 2017-10-14 NOTE — H&P (Signed)
ADMISSION NOTE  Primary Care Physician:  Jani Gravel, MD Primary Gastroenterologist:    MD  CHIEF COMPLAINT:  cirrhosis  HPI: [name] is a 77 y.o. 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 He has chronic portal vein thrombosis. Here for surveillance of varices. He complains of sinus drainage and throat clearing.    Past Medical History:  Diagnosis Date  . Anemia   . Arthritis    "hands" (09/27/2015)  . Carotid artery occlusion   . Cellulitis and abscess of leg 03/19/2013   RIGHT LEG HEALED  . 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 Cirrhosis of liver ([place])   . Clotting disorder ([place])   . Colon polyps   . CVA (cerebral vascular accident) ([place]) ~ 2000   "minor stroke", denies residual on 09/27/2015  . DVT (deep venous thrombosis) ([place]) 03/20/2013   RIGHT LEG AND LEFT LEG  . DVT (deep venous thrombosis) ([place]) 02/2013   RLE  . Esophageal varices ([place])   . Essential hypertension, benign 03/20/2013  . History of blood transfusion 1998   "blood clot began bleeding"  . Hyperlipidemia   . 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 Leukopenia 08/11/2012  . Mesenteric venous thrombosis 1996  . Mitral regurgitation   . Pancytopenia, acquired ([place]) 09/17/2016   Due to non alcoholic cirhosis  . Splenomegaly   . Type II diabetes mellitus ([place]) dx'd in the 1990s    Past Surgical History:  Procedure Laterality Date  . CATARACT EXTRACTION W/ INTRAOCULAR LENS IMPLANT Right 06/26/2012  . CATARACT EXTRACTION W/ INTRAOCULAR LENS IMPLANT Left 07/27/2012  . COLONOSCOPY    . ESOPHAGOGASTRODUODENOSCOPY (EGD) WITH PROPOFOL N/A 09/03/2016   Procedure: ESOPHAGOGASTRODUODENOSCOPY (EGD) WITH PROPOFOL;  Surgeon: Ladene Artist, MD;  Location: [facility];  Service: Endoscopy;  Laterality: N/A;    Prior to Admission medications   Medication Sig Start Date End Date Taking? Authorizing Provider  aspirin EC 81 MG tablet Take 81  mg by mouth daily.   Yes [provider]  atorvastatin (LIPITOR) 40 MG tablet Take 40 mg by mouth every evening.    Yes [provider]  cycloSPORINE (RESTASIS) 0.05 % ophthalmic emulsion Place 1 drop into both eyes 2 (two) times daily as needed (for dry eyes).   Yes [provider]  ferrous sulfate 325 (65 FE) MG EC tablet Take 1 tablet (325 mg total) by mouth 2 (two) times daily with a meal. 07/07/15  Yes Ladene Artist, MD  glipiZIDE (GLUCOTROL) 10 MG tablet Take 10-20 mg by mouth See admin instructions. Take 20 mg by mouth in the morning and take 10 mg by mouth in the evening   Yes [provider]  losartan (COZAAR) 50 MG tablet Take 50 mg by mouth daily.  08/07/15  Yes [provider]  Lutein 6 MG TABS Take 6 mg by mouth daily.   Yes [provider]  metFORMIN (GLUCOPHAGE) 1000 MG tablet Take 1,000 mg by mouth 2 (two) times daily with a meal.   Yes [provider]  saxagliptin HCl (ONGLYZA) 5 MG TABS tablet Take 5 mg by mouth every evening.    Yes [provider]  calcium carbonate (TUMS - DOSED IN MG ELEMENTAL CALCIUM) 500 MG chewable tablet Chew 1 tablet by mouth daily as needed for indigestion or heartburn.    [provider]    Current Facility-Administered Medications  Medication Dose Route Frequency Provider Last Rate Last  Dose  . 0.9 %  sodium chloride infusion   Intravenous Continuous Ladene Artist, MD      . lactated ringers infusion   Intravenous Continuous Ladene Artist, MD 10 mL/hr at 10/14/17 0728 1,000 mL at 10/14/17 0728    Allergies as of 07/24/2017  . (No Known Allergies)    Family History  Problem Relation Age of Onset  . Alzheimer's disease Father   . Dementia Father   . Cancer Sister        unknown type  . Diabetes Sister   . Deep vein thrombosis Sister        Varicose vein  . Heart disease Mother        After age 16  . Hypertension Mother   . Pancreatic cancer Son      Social History   Socioeconomic History  . Marital status: Married    Spouse name: Not on file  . Number of children: 2  . Years of education: 52  . Highest education level: Not on file  Occupational History  . Occupation: retired  Scientific laboratory technician  . Financial resource strain: Not on file  . Food insecurity:    Worry: Not on file    Inability: Not on file  . Transportation needs:    Medical: Not on file    Non-medical: Not on file  Tobacco Use  . Smoking status: Never Smoker  . Smokeless tobacco: Never Used  Substance and Sexual Activity  . Alcohol use: No    Alcohol/week: 0.0 standard drinks  . Drug use: No  . Sexual activity: Never  Lifestyle  . Physical activity:    Days per week: Not on file    Minutes per session: Not on file  . Stress: Not on file  Relationships  . Social connections:    Talks on phone: Not on file    Gets together: Not on file    Attends religious service: Not on file    Active member of club or organization: Not on file    Attends meetings of clubs or organizations: Not on file    Relationship status: Not on file  . Intimate partner violence:    Fear of current or ex partner: Not on file    Emotionally abused: Not on file    Physically abused: Not on file    Forced sexual activity: Not on file  Other Topics Concern  . Not on file  Social History Narrative   Patient drinks 1 cup of coffee daily.   Patient is left handed.    Review of Systems:  All systems reviewed an negative except where noted in HPI.  Gen: Denies any fever, chills, sweats, anorexia, fatigue, weakness, malaise, weight loss, and sleep disorder CV: Denies chest pain, angina, palpitations, syncope, orthopnea, PND, peripheral edema, and claudication. Resp: Denies dyspnea at rest, dyspnea with exercise, cough, sputum, wheezing, coughing up blood, and pleurisy. GI: Denies vomiting blood, jaundice, and fecal incontinence.   Denies dysphagia or odynophagia. GU : Denies  urinary burning, blood in urine, urinary frequency, urinary hesitancy, nocturnal urination, and urinary incontinence. MS: Denies joint pain, limitation of movement, and swelling, stiffness, low back pain, extremity pain. Denies muscle weakness, cramps, atrophy.  Derm: Denies rash, itching, dry skin, hives, moles, warts, or unhealing ulcers.  Psych: Denies depression, anxiety, memory loss, suicidal ideation, hallucinations, paranoia, and confusion. Heme: Denies bruising, bleeding, and enlarged lymph nodes. Neuro:  Denies any headaches, dizziness, paresthesias. Endo:  Denies any problems  with DM, thyroid, adrenal function.  Physical Exam: Vital signs in last 24 hours: Temp:  [97.8 F (36.6 C)] 97.8 F (36.6 C) (08/27 0720) Pulse Rate:  [79] 79 (08/27 0720) Resp:  [14] 14 (08/27 0720) BP: (160)/(61) 160/61 (08/27 0720) SpO2:  [100 %] 100 % (08/27 0720) Weight:  [75.2 kg] 75.2 kg (08/27 0720)   General:  Alert, well-developed, in NAD Head:  Normocephalic and atraumatic. Eyes:  Sclera clear, no icterus.   Conjunctiva pink. Ears:  Normal auditory acuity. Mouth:  No deformity or lesions.  Neck:  Supple; no masses . Lungs:  Clear throughout to auscultation.   No wheezes, crackles, or rhonchi. No acute distress. Heart:  Regular rate and rhythm; no murmurs. Abdomen:  Soft, nondistended, nontender. No masses, hepatomegaly. No obvious masses.  Normal bowel .    Rectal:  Not done  Msk:  Symmetrical without gross deformities.. Pulses:  Normal pulses noted. Extremities:  Without edema. Neurologic:  Alert and  oriented x4;  grossly normal neurologically. Skin:  Intact without significant lesions or rashes. Cervical Nodes:  No significant cervical adenopathy. Psych:  Alert and cooperative. Normal mood and affect.   Impression / Plan:  Cirrhosis with esophageal varices. Chronic vein thrombosis. EGD for surveillance of varices. The risks (including bleeding, perforation, infection, missed  lesions, medication reactions and possible hospitalization or surgery if complications occur), benefits, and alternatives to endoscopy with possible biopsy and possible dilation were discussed with the patient and they consent to proceed.     LOS: 0 days   Tishie Altmann T. Fuller Plan MD 10/14/2017, 8:23 AM [phone] office

## 2017-10-14 NOTE — Anesthesia Preprocedure Evaluation (Signed)
Anesthesia Evaluation  Patient identified by MRN, date of birth, ID band Patient awake    Reviewed: Allergy & Precautions, NPO status , Patient's Chart, lab work & pertinent test results  Airway Mallampati: II  TM Distance: >3 FB Neck ROM: Full    Dental no notable dental hx.    Pulmonary neg pulmonary ROS,    Pulmonary exam normal breath sounds clear to auscultation       Cardiovascular hypertension, + Peripheral Vascular Disease and + DVT  + Valvular Problems/Murmurs MR  Rhythm:Regular Rate:Normal + Systolic murmurs    Neuro/Psych CVA, No Residual Symptoms negative psych ROS   GI/Hepatic negative GI ROS, (+) Cirrhosis       ,   Endo/Other  diabetes  Renal/GU negative Renal ROS  negative genitourinary   Musculoskeletal negative musculoskeletal ROS (+)   Abdominal   Peds negative pediatric ROS (+)  Hematology negative hematology ROS (+)   Anesthesia Other Findings   Reproductive/Obstetrics negative OB ROS                             Anesthesia Physical Anesthesia Plan  ASA: III  Anesthesia Plan: MAC   Post-op Pain Management:    Induction: Intravenous  PONV Risk Score and Plan: 0  Airway Management Planned: Simple Face Mask  Additional Equipment:   Intra-op Plan:   Post-operative Plan:   Informed Consent: I have reviewed the patients History and Physical, chart, labs and discussed the procedure including the risks, benefits and alternatives for the proposed anesthesia with the patient or authorized representative who has indicated his/her understanding and acceptance.   Dental advisory given  Plan Discussed with: CRNA and Surgeon  Anesthesia Plan Comments:         Anesthesia Quick Evaluation

## 2017-10-15 ENCOUNTER — Encounter (HOSPITAL_COMMUNITY): Payer: Self-pay | Admitting: Gastroenterology

## 2017-10-15 NOTE — Anesthesia Postprocedure Evaluation (Signed)
Anesthesia Post Note  Patient: [name]  Procedure(s) Performed: ESOPHAGOGASTRODUODENOSCOPY (EGD) WITH PROPOFOL (N/A ) BIOPSY     Patient location during evaluation: PACU Anesthesia Type: MAC Level of consciousness: awake and alert Pain management: pain level controlled Vital Signs Assessment: post-procedure vital signs reviewed and stable Respiratory status: spontaneous breathing, nonlabored ventilation, respiratory function stable and patient connected to nasal cannula oxygen Cardiovascular status: stable and blood pressure returned to baseline Postop Assessment: no apparent nausea or vomiting Anesthetic complications: no    Last Vitals:  Vitals:   10/14/17 0915 10/14/17 0920  BP:  (!) 162/51  Pulse: 63 64  Resp: 17 19  Temp:    SpO2: 100% 100%    Last Pain:  Vitals:   10/14/17 0915  TempSrc:   PainSc: 0-No pain                 Aliesha Dolata S

## 2017-10-21 ENCOUNTER — Encounter: Payer: Self-pay | Admitting: Gastroenterology

## 2017-11-11 DIAGNOSIS — Z7984 Long term (current) use of oral hypoglycemic drugs: Secondary | ICD-10-CM | POA: Diagnosis not present

## 2017-11-11 DIAGNOSIS — E119 Type 2 diabetes mellitus without complications: Secondary | ICD-10-CM | POA: Diagnosis not present

## 2017-11-11 DIAGNOSIS — H04123 Dry eye syndrome of bilateral lacrimal glands: Secondary | ICD-10-CM | POA: Diagnosis not present

## 2017-11-11 DIAGNOSIS — H3589 Other specified retinal disorders: Secondary | ICD-10-CM | POA: Diagnosis not present

## 2017-11-11 DIAGNOSIS — H40013 Open angle with borderline findings, low risk, bilateral: Secondary | ICD-10-CM | POA: Diagnosis not present

## 2017-11-21 ENCOUNTER — Other Ambulatory Visit: Payer: Self-pay

## 2017-11-21 NOTE — Patient Outreach (Signed)
[facility]) Care Management  11/21/2017  Sears Oran Norell [birth year] [id number]   Medication Adherence call to Mr. Norwood Swinford spoke with patient's wife she said patient still has medication on Losartan 50 mg and Atorvastatin 40 mg they did not want [place] to help them order the medication they will order it them self patient is showing past due under [place].   [place] Management Direct Dial [phone]  Fax [phone] [email or web address]

## 2017-12-01 DIAGNOSIS — E039 Hypothyroidism, unspecified: Secondary | ICD-10-CM | POA: Diagnosis not present

## 2017-12-01 DIAGNOSIS — E119 Type 2 diabetes mellitus without complications: Secondary | ICD-10-CM | POA: Diagnosis not present

## 2017-12-01 DIAGNOSIS — I10 Essential (primary) hypertension: Secondary | ICD-10-CM | POA: Diagnosis not present

## 2017-12-09 DIAGNOSIS — E039 Hypothyroidism, unspecified: Secondary | ICD-10-CM | POA: Diagnosis not present

## 2017-12-09 DIAGNOSIS — E119 Type 2 diabetes mellitus without complications: Secondary | ICD-10-CM | POA: Diagnosis not present

## 2017-12-09 DIAGNOSIS — I10 Essential (primary) hypertension: Secondary | ICD-10-CM | POA: Diagnosis not present

## 2017-12-09 DIAGNOSIS — D649 Anemia, unspecified: Secondary | ICD-10-CM | POA: Diagnosis not present

## 2017-12-09 DIAGNOSIS — D696 Thrombocytopenia, unspecified: Secondary | ICD-10-CM | POA: Diagnosis not present

## 2017-12-25 ENCOUNTER — Ambulatory Visit: Payer: Medicare Other | Admitting: Family

## 2017-12-25 ENCOUNTER — Ambulatory Visit (HOSPITAL_COMMUNITY): Payer: Medicare Other | Attending: Internal Medicine

## 2017-12-29 ENCOUNTER — Encounter: Payer: Self-pay | Admitting: Family

## 2018-02-16 ENCOUNTER — Other Ambulatory Visit: Payer: Self-pay

## 2018-02-16 ENCOUNTER — Ambulatory Visit (INDEPENDENT_AMBULATORY_CARE_PROVIDER_SITE_OTHER): Payer: Medicare Other | Admitting: Family

## 2018-02-16 ENCOUNTER — Ambulatory Visit (HOSPITAL_COMMUNITY)
Admission: RE | Admit: 2018-02-16 | Discharge: 2018-02-16 | Disposition: A | Discharge status: Home / Self Care | Payer: Medicare Other | Source: Ambulatory Visit | Attending: Family | Admitting: Family

## 2018-02-16 ENCOUNTER — Encounter: Payer: Self-pay | Admitting: Family

## 2018-02-16 VITALS — BP 135/67 | HR 66 | Temp 97.4°F | Resp 18 | Ht 70.0 in | Wt 165.0 lb

## 2018-02-16 DIAGNOSIS — I6523 Occlusion and stenosis of bilateral carotid arteries: Secondary | ICD-10-CM

## 2018-02-16 DIAGNOSIS — Z8673 Personal history of transient ischemic attack (TIA), and cerebral infarction without residual deficits: Secondary | ICD-10-CM

## 2018-02-16 NOTE — Patient Instructions (Signed)

## 2018-02-16 NOTE — Progress Notes (Signed)
Chief Complaint: Follow up Extracranial Carotid Artery Stenosis   History of Present Illness  [name] is a 77 y.o. male whom Dr. Scot Dock has been monitroing for extracranial carotid artery stenosis which is been relatively stable.   He had a "mild" stroke about 2005 which manifested as dizziness and bilateral arms weakness, but no stroke or TIA symptoms since then, states it was noted on either CT or MRI which also found the carotid artery stenosis.  He still has balance issues if he turns too quickly.   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 Lajoyce Corners, he had no stents or surgeries for this.  Since he was on the anticoagulants, he has developed low platelets which is managed by Dr. Beryle Beams.  Patient states he has not had any stomach pain since treated for the mesenteric vein thrombosis.[name]  He did lose weight before mesenteric vein thrombosis treatment, but gained it back afterward.  Patient denies any cardiac problems or dyspnea.  The patient denies a history of amaurosis fugax or monocular blindness, unilateral facial drooping, hemiplegia, or receptive or expressive aphasia.  He denies claudication symptomswith walking. He lost his sense of taste since early 2016, seeing Dr. Jannifer Franklin, neurologist, re this. Wife reported at a previous visit that he is not eating as well as he should due to this. Pt denies post prandial abdominal pain.   He was hospitalized at [facility] January, 2015 for cellulitis and DVT in RLE and ulceration which has healed.  He does not have any clotting disorders that he is aware of.  He has been diagnosed with cirrhosis and his gastroenterologist was recommending discontinuing his Coumadin whichDr. Golden Hurter.  Dr. Scot Dock last evaluated pt on 07-03-16. At that timethe patient hada stable  60-79% right carotid stenosis. The end-diastolic velocity was 79 cm/s suggesting thiswas in the mid range. Hewas asymptomatic. He is on a statin.Dr. Legrand Como favor discontinuing his Coumadin and starting him on aspirin 81 mg daily.Pt was tofollow in 6 months with a carotid duplexand we will see him back at that time.    Diabetic: Yes, 6.2 A1C on 12-01-17 (review of records), in good control  Tobacco use: non-smoker  Pt meds include:  Statin : Yes  Betablocker: No  ASA:81 mg daily Other anticoagulants/antiplatelets:no, he had previously been on coumadin for DVT in both legs   Past Medical History:  Diagnosis Date  . Anemia   . Arthritis    "hands" (09/27/2015)  . Carotid artery occlusion   . Cellulitis and abscess of leg 03/19/2013   RIGHT LEG HEALED  . 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 Cirrhosis of liver ([place])   . Clotting disorder ([place])   . Colon polyps   . CVA (cerebral vascular accident) ([place]) ~ 2000   "minor stroke", denies residual on 09/27/2015  . DVT (deep venous thrombosis) ([place]) 03/20/2013   RIGHT LEG AND LEFT LEG  . DVT (deep venous thrombosis) ([place]) 02/2013   RLE  . Esophageal varices ([place])   . Essential hypertension, benign 03/20/2013  . History of blood transfusion 1998   "blood clot began bleeding"  . Hyperlipidemia   . 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 Leukopenia 08/11/2012  . Mesenteric venous thrombosis 1996  . Mitral regurgitation   . Pancytopenia, acquired ([place]) 09/17/2016   Due to non alcoholic cirhosis  .  Splenomegaly   . Type II diabetes mellitus ([place]) dx'd in the 1990s    Social History Social History   Tobacco Use  . Smoking status: Never Smoker  . Smokeless tobacco: Never Used  Substance Use Topics  . Alcohol use: No    Alcohol/week: 0.0 standard drinks  . Drug use: No    Family History Family History  Problem Relation Age of Onset  . Alzheimer's disease Father   .  Dementia Father   . Cancer Sister        unknown type  . Diabetes Sister   . Deep vein thrombosis Sister        Varicose vein  . Heart disease Mother        After age 14  . Hypertension Mother   . Pancreatic cancer Son     Surgical History Past Surgical History:  Procedure Laterality Date  . BIOPSY  10/14/2017   Procedure: BIOPSY;  Surgeon: Ladene Artist, MD;  Location: [facility];  Service: Endoscopy;;  . CATARACT EXTRACTION W/ INTRAOCULAR LENS IMPLANT Right 06/26/2012  . CATARACT EXTRACTION W/ INTRAOCULAR LENS IMPLANT Left 07/27/2012  . COLONOSCOPY    . ESOPHAGOGASTRODUODENOSCOPY (EGD) WITH PROPOFOL N/A 09/03/2016   Procedure: ESOPHAGOGASTRODUODENOSCOPY (EGD) WITH PROPOFOL;  Surgeon: Ladene Artist, MD;  [facility];  Service: Endoscopy;  Laterality: N/A;  . ESOPHAGOGASTRODUODENOSCOPY (EGD) WITH PROPOFOL N/A 10/14/2017   Procedure: ESOPHAGOGASTRODUODENOSCOPY (EGD) WITH PROPOFOL;  Surgeon: Ladene Artist, MD;  [facility];  Service: Endoscopy;  Laterality: N/A;    No Known Allergies  Current Outpatient Medications  Medication Sig Dispense Refill  . aspirin EC 81 MG tablet Take 81 mg by mouth daily.    [name] atorvastatin (LIPITOR) 40 MG tablet Take 40 mg by mouth every evening.     . cycloSPORINE (RESTASIS) 0.05 % ophthalmic emulsion Place 1 drop into both eyes 2 (two) times daily as needed (for dry eyes).    . ferrous sulfate 325 (65 FE) MG EC tablet Take 1 tablet (325 mg total) by mouth 2 (two) times daily with a meal. 60 tablet 3  . glipiZIDE (GLUCOTROL) 10 MG tablet Take 10-20 mg by mouth See admin instructions. Take 20 mg by mouth in the morning and take 10 mg by mouth in the evening    . losartan (COZAAR) 50 MG tablet Take 50 mg by mouth daily.     . Lutein 6 MG TABS Take 6 mg by mouth daily.    . metFORMIN (GLUCOPHAGE) 1000 MG tablet Take 1,000 mg by mouth 2 (two) times daily with a meal.    . saxagliptin HCl (ONGLYZA) 5 MG TABS tablet Take 5 mg by  mouth every evening.     . calcium carbonate (TUMS - DOSED IN MG ELEMENTAL CALCIUM) 500 MG chewable tablet Chew 1 tablet by mouth daily as needed for indigestion or heartburn.    [name] omeprazole (PRILOSEC) 40 MG capsule Take 1 capsule (40 mg total) by mouth daily. (Patient not taking: Reported on 02/16/2018) 30 capsule 11   No current facility-administered medications for this visit.     Review of Systems : See HPI for pertinent positives and negatives.  Physical Examination  Vitals:   02/16/18 1130 02/16/18 1132  BP: 138/62 135/67  Pulse: 66   Resp: 18   Temp: (!) 97.4 F (36.3 C)   SpO2: 99%   Weight: 165 lb (74.8 kg)   Height: 5' 10"  (1.778 m)    Body mass index is  23.68 kg/m.  General: WDWN male in NAD  GAIT:slight limp  HENT: no gross abnormalities  Eyes: PERRLA  Pulmonary:Respirations are non-labored, CTAB, no rales, rhonchi, or wheezing.  Cardiac: Irregular rhythm with controlled rate, no detected murmur.   VASCULAR EXAM Carotid Bruits Left Right   Positive positive   Abdominal aortic pulse is not palpable. Radial pulses are 2+ palpable and equal.   LE Pulses  LEFT  RIGHT   POPLITEAL  not palpable  not palpable  POSTERIOR TIBIAL  Not palpable  not palpable   DORSALIS PEDIS palpable palpable   Gastrointestinal: soft, nontender, BS WNL, no r/g, no palpable masses Musculoskeletal: Nomuscle atrophy/wasting. M/S 5/5 throughout except 4/5 RLE, Extremities without ischemic changes.  No peripheral edema.  Skin:No rash, no ulcers, no cellulitis.  Neurologic: A&O X 3; Appropriate Affect,speech is normal, CN 2-12 intact, Pain and light touch intact in extremities, some hearing loss, Motor exam as listed above. Psychiatric: Normal thought content, mood appropriate to clinical situation.    Assessment: [facility] STFLEUR is a 77 y.o. male who who had an ischemic stroke in 2005, has mild residual neurological deficits, and  has had no subsequent stroke or TIA.  Fortunately his DM remains in good control and he has never used tobacco.  He takes a daily statin and 81 mg ASA.   Irregular cardiac rhythm. No dyspnea or chest pain. Pt had an irregular cardiac rhythm at his previous 2 visits and I advised him to discuss this with his PCP, states he forgot. He may not be able to take anticoagulants due to his cirrhosis.   DATA  Carotid Duplex (02-16-18): Right Carotid: Velocities in the right ICA are consistent with a 60-79%                stenosis. Left Carotid: Velocities in the left ICA are consistent with a 40-59% stenosis. Vertebrals:  Bilateral vertebral arteries demonstrate antegrade flow. Subclavians: Normal flow hemodynamics were seen in bilateral subclavian              arteries. No change compared to the exams on 01-17-17 and 07-15-17.    MRI of Brain, non contrast (11-09-14): IMPRESSION:  Abnormal MRI brain (without) demonstrating: 1. 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 2. No acute findings. 3. No significant change from MRI on 11/12/06.    Plan:  Follow-up in 79monthwith Carotid Duplex scan.  I discussed in depth with the patient the nature of atherosclerosis, and emphasized the importance of maximal medical management including strict control of blood pressure, blood glucose, and lipid levels, obtaining regular exercise, and continued cessation of smoking.  The patient is aware that without maximal medical management the underlying atherosclerotic disease process will progress, limiting the benefit of any interventions. The patient was given information about stroke prevention and what symptoms should prompt the patient to seek immediate medical care. Thank you for allowing [facility] participate in this patient's care.  SClemon Chambers RN, MSN, FNP-C [facility] GW[facility] PointOffice: [phone] Clinic Physician:  CDonzetta Matters 02/16/18 11:41 AM

## 2018-03-13 ENCOUNTER — Other Ambulatory Visit: Payer: Self-pay

## 2018-03-13 DIAGNOSIS — E119 Type 2 diabetes mellitus without complications: Secondary | ICD-10-CM | POA: Diagnosis not present

## 2018-03-13 DIAGNOSIS — I10 Essential (primary) hypertension: Secondary | ICD-10-CM | POA: Diagnosis not present

## 2018-03-13 DIAGNOSIS — E7800 Pure hypercholesterolemia, unspecified: Secondary | ICD-10-CM | POA: Diagnosis not present

## 2018-03-13 DIAGNOSIS — Z0000 Encounter for general adult medical examination without abnormal findings: Secondary | ICD-10-CM | POA: Diagnosis not present

## 2018-03-13 NOTE — Patient Outreach (Signed)
[facility]) Care Management  03/13/2018  [name] 03-27-1940 [id number]   Medication Adherence call to Mr. [name] patient did not answer patient is showing past due on Atorvastatin 40 mg and Onglyza  5 mg. pharmacy said patient only pick up [place] on 03/06/18 but not Atorvastatin.[name] is showing past due under [place].   [place] Management Direct Dial [phone]  Fax [phone] [name]

## 2018-03-20 DIAGNOSIS — R002 Palpitations: Secondary | ICD-10-CM | POA: Diagnosis not present

## 2018-03-20 DIAGNOSIS — E119 Type 2 diabetes mellitus without complications: Secondary | ICD-10-CM | POA: Diagnosis not present

## 2018-03-20 DIAGNOSIS — R5383 Other fatigue: Secondary | ICD-10-CM | POA: Diagnosis not present

## 2018-03-20 DIAGNOSIS — Z0000 Encounter for general adult medical examination without abnormal findings: Secondary | ICD-10-CM | POA: Diagnosis not present

## 2018-03-20 DIAGNOSIS — E7800 Pure hypercholesterolemia, unspecified: Secondary | ICD-10-CM | POA: Diagnosis not present

## 2018-03-20 DIAGNOSIS — I10 Essential (primary) hypertension: Secondary | ICD-10-CM | POA: Diagnosis not present

## 2018-04-02 DIAGNOSIS — I350 Nonrheumatic aortic (valve) stenosis: Secondary | ICD-10-CM | POA: Diagnosis not present

## 2018-04-02 DIAGNOSIS — E119 Type 2 diabetes mellitus without complications: Secondary | ICD-10-CM | POA: Diagnosis not present

## 2018-04-02 DIAGNOSIS — R002 Palpitations: Secondary | ICD-10-CM | POA: Diagnosis not present

## 2018-04-02 DIAGNOSIS — R5383 Other fatigue: Secondary | ICD-10-CM | POA: Diagnosis not present

## 2018-04-02 DIAGNOSIS — I10 Essential (primary) hypertension: Secondary | ICD-10-CM | POA: Diagnosis not present

## 2018-06-06 IMAGING — US US ABDOMEN COMPLETE
1 series · 13 of 25 positions shown · non-contrast
Comparison: Abdominal ultrasound November 24, 2008 and CT scan of
the abdomen and pelvis January 25, 2016.

CLINICAL DATA: Hepatic cirrhosis, hepatoma screening. History of
diabetes, hyperlipidemia, splenomegaly.

EXAM:
ABDOMEN ULTRASOUND COMPLETE

[Series 1: us abdomen complete · 0.19mm/px · 13 of 102 slices shown]
[im 1/102]
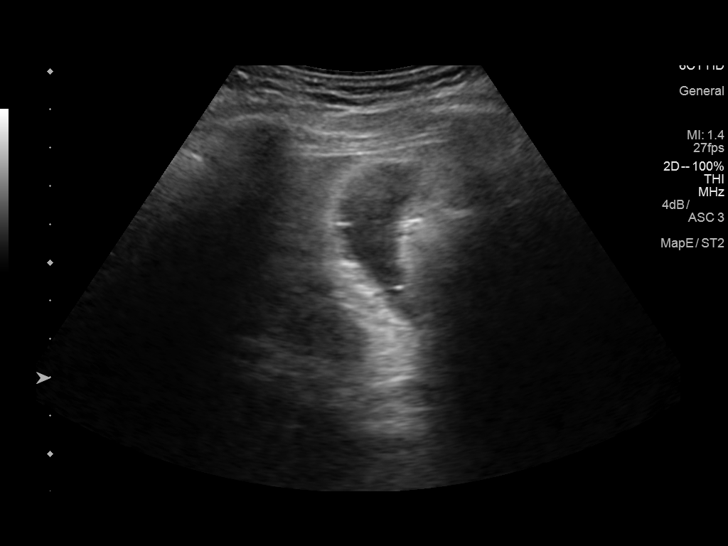
[im 9/102]
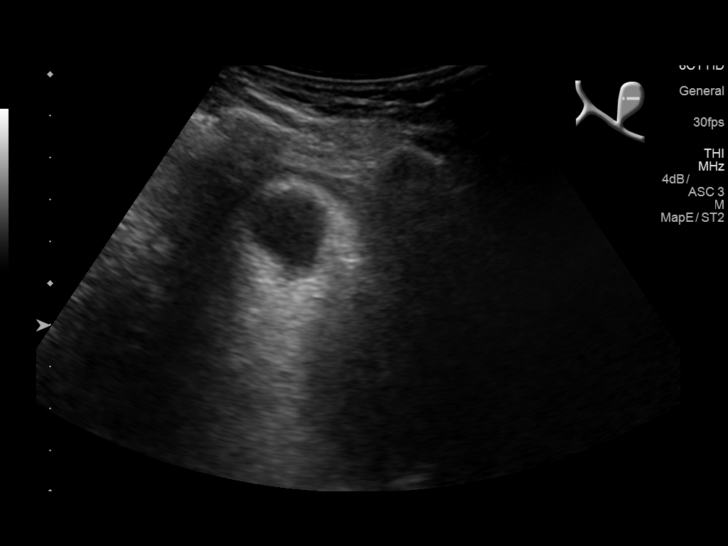
[im 17/102]
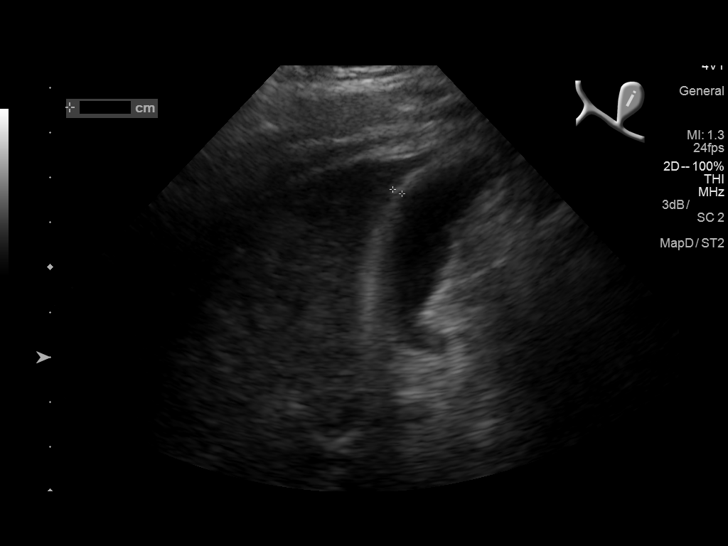
[im 26/102]
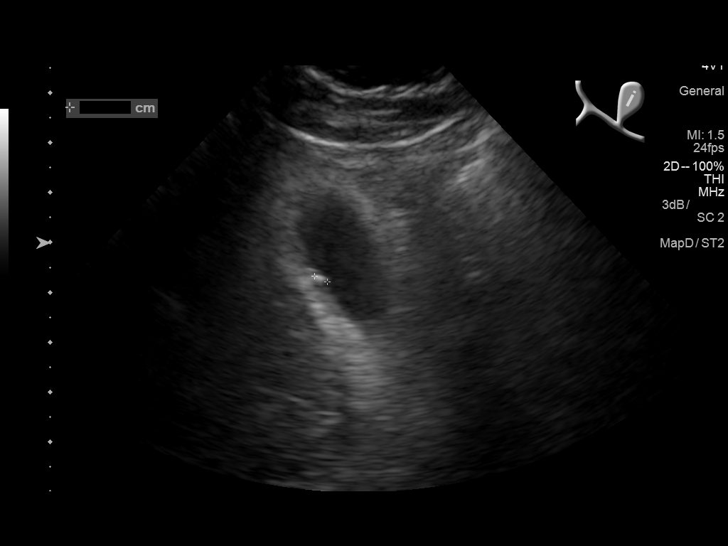
[im 34/102]
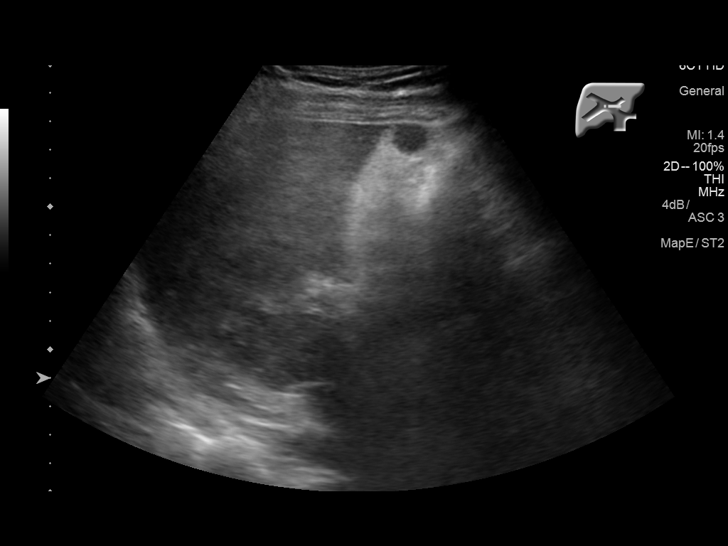
[im 43/102]
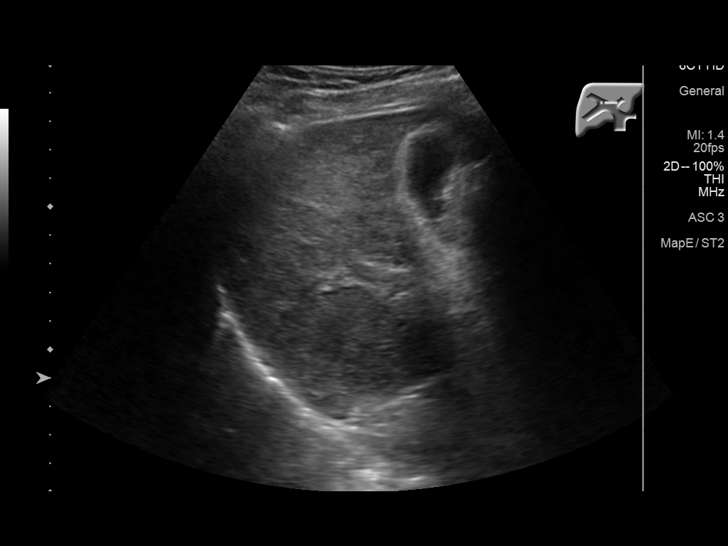
[im 51/102]
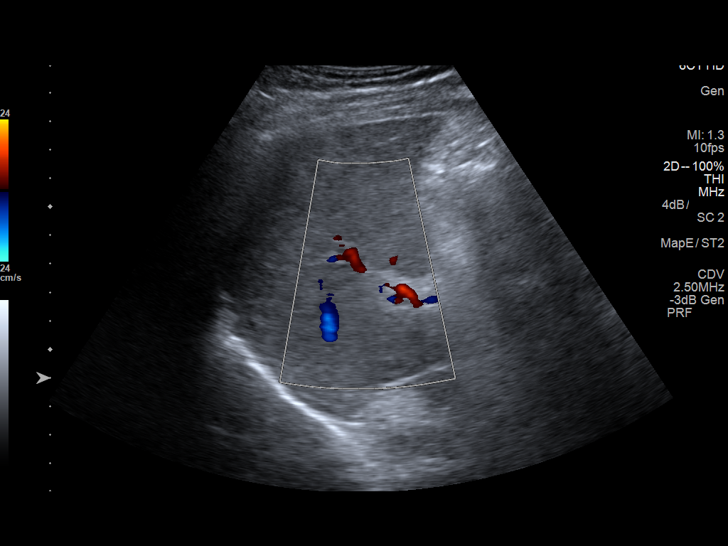
[im 59/102]
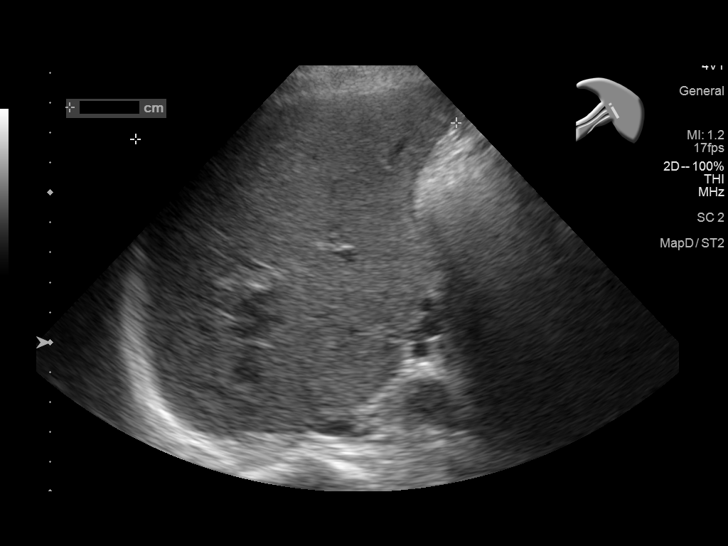
[im 68/102]
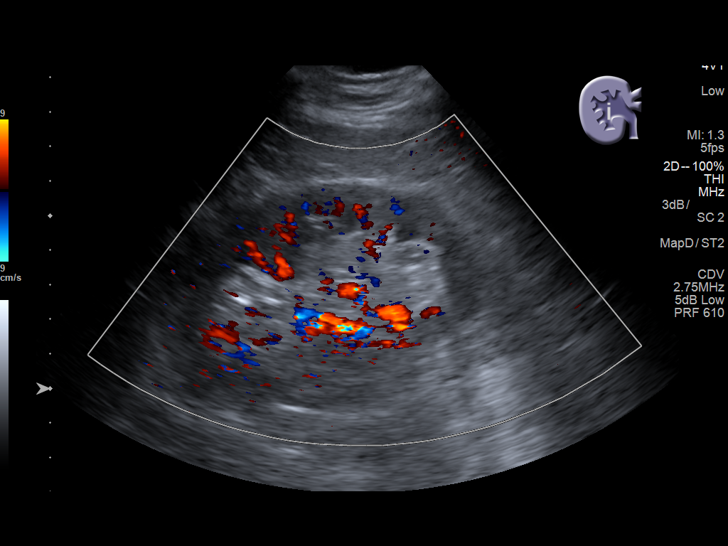
[im 76/102]
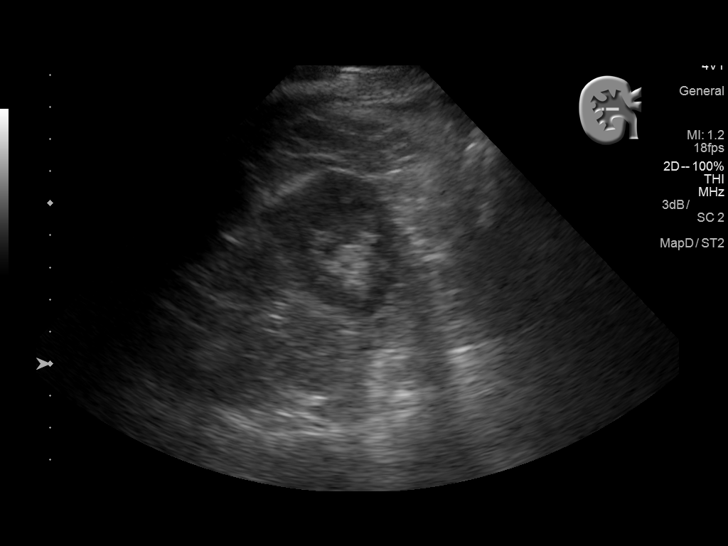
[im 85/102]
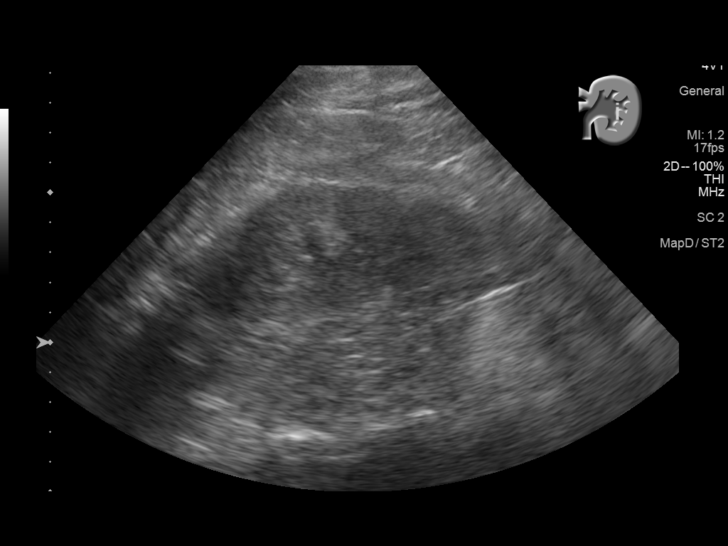
[im 93/102]
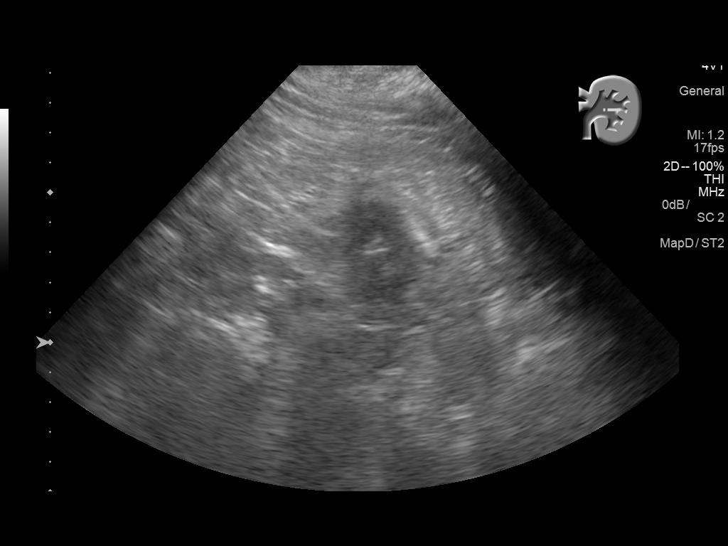
[im 102/102]
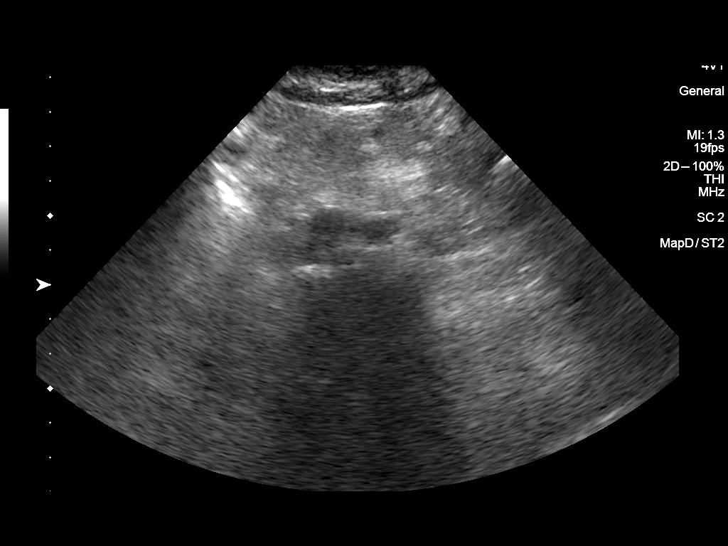

[13 of 25 positions shown; findings below may reference images not displayed]

FINDINGS: Gallbladder: The gallbladder is adequately distended. There is a 3
mm diameter polyp. No stones or sludge are observed. There is no
gallbladder wall thickening, pericholecystic fluid, or positive
sonographic Murphy's sign.

Common bile duct: Diameter: 3.6 mm

Liver: The hepatic echotexture is heterogeneously decreased. There
is prominence of the intrahepatic portal venous structures. There is
no discrete mass. The surface contour of the liver is smooth.

IVC: No abnormality visualized.

Pancreas: Bowel gas obscures the pancreas.

Spleen: There is splenomegaly with splenic length of 10.7 cm and
calculated volume of 633 cc.

Right Kidney: Length: 10.4 cm. Echogenicity within normal limits. No
mass or hydronephrosis visualized.

Left Kidney: Length: 11.5 cm. Echogenicity within normal limits. No
mass or hydronephrosis visualized.

Abdominal aorta: No aneurysm visualized.

Other findings: There is no ascites.
IMPRESSION: Decreased hepatic echotexture with prominence of intraparenchymal
vascularity consistent with known cirrhosis and portal hypertension
with collaterals. No discrete liver mass. Splenomegaly. No ascites.

Gallbladder polyps.  No evidence of stones or acute cholecystitis.

## 2018-06-11 DIAGNOSIS — I10 Essential (primary) hypertension: Secondary | ICD-10-CM | POA: Diagnosis not present

## 2018-06-11 DIAGNOSIS — E119 Type 2 diabetes mellitus without complications: Secondary | ICD-10-CM | POA: Diagnosis not present

## 2018-06-11 DIAGNOSIS — D696 Thrombocytopenia, unspecified: Secondary | ICD-10-CM | POA: Diagnosis not present

## 2018-06-17 ENCOUNTER — Telehealth: Payer: Self-pay | Admitting: Hematology and Oncology

## 2018-06-17 NOTE — Telephone Encounter (Signed)
Received a call from Pat at [facility] to schedule an urgent appt for low platelets. Pt has been scheduled to see Dr. Lindi Adie on 5/1 at [place] will notify the pt of the appt date and time.

## 2018-06-18 DIAGNOSIS — D693 Immune thrombocytopenic purpura: Secondary | ICD-10-CM | POA: Diagnosis not present

## 2018-06-18 DIAGNOSIS — E119 Type 2 diabetes mellitus without complications: Secondary | ICD-10-CM | POA: Diagnosis not present

## 2018-06-18 DIAGNOSIS — E7800 Pure hypercholesterolemia, unspecified: Secondary | ICD-10-CM | POA: Diagnosis not present

## 2018-06-18 DIAGNOSIS — I10 Essential (primary) hypertension: Secondary | ICD-10-CM | POA: Diagnosis not present

## 2018-06-18 DIAGNOSIS — Z86718 Personal history of other venous thrombosis and embolism: Secondary | ICD-10-CM | POA: Diagnosis not present

## 2018-06-18 NOTE — Progress Notes (Signed)
HEMATOLOGY-ONCOLOGY [place] VISIT PROGRESS NOTE  I connected with [name] on 06/19/2018 at 10:30 AM EDT by Webex video conference and verified that I am speaking with the correct person using two identifiers.  I discussed the limitations, risks, security and privacy concerns of performing an evaluation and management service by Webex and the availability of in person appointments.  I also discussed with the patient that there may be a patient responsible charge related to this service. The patient expressed understanding and agreed to proceed.  Patient's Location: Home Physician Location: [place]: Chronic thrombocytopenia  HISTORY OF PRESENTING ILLNESS:  [name] 78 y.o. male is here because of recent finding of low platelets. He was referred by [facility]. Platelet count from 03/13/18 was 40. His most recent lab work from 06/12/18 showed: Hg 10.5, hematocrit 31.6, WBC 3.6, platelets 36, creatinine 1.35. He presents to the clinic today for an initial consultation.   He was previously evaluated by Dr. Beryle Beams who determined that the cause of thrombocytopenia is related to cirrhosis and portal hypertension with splenomegaly.  The cause of the portal hypertension was felt to be related to portal vein thrombosis.  Patient has had chronic thrombocytopenia going back several years with the platelet count has been ranging between 35-45.  In 2014 he had a platelet count of 35.  He has not had any excessive bruising or bleeding issues over time.  Therefore the most recent platelet count of 36 is really no different than where he has been over the past 5 years.  REVIEW OF SYSTEMS:   Constitutional: Denies fevers, chills or abnormal weight loss Eyes: Denies blurriness of vision Ears, nose, mouth, throat, and face: Denies mucositis or sore throat Respiratory: Denies cough, dyspnea or wheezes Cardiovascular: Denies palpitation,  chest discomfort Gastrointestinal:  Denies nausea, heartburn or change in bowel habits Skin: Denies abnormal skin rashes Lymphatics: Denies new lymphadenopathy or easy bruising Neurological:Denies numbness, tingling or new weaknesses Behavioral/Psych: Mood is stable, no new changes  Extremities: No lower extremity edema   All other systems were reviewed with the patient and are negative.  Observations/Objective:  There were no vitals filed for this visit. There is no height or weight on file to calculate BMI.  I have reviewed the data as listed CMP Latest Ref Rng & Units 09/17/2016 11/21/2015 10/02/2015  Glucose 65 - 99 mg/dL 168(H) - 126(H)  BUN 8 - 27 mg/dL 18 - 14  Creatinine 0.76 - 1.27 mg/dL 1.08 - 1.23  Sodium 134 - 144 mmol/L 144 - 140  Potassium 3.5 - 5.2 mmol/L 4.6 - 4.0  Chloride 96 - 106 mmol/L 110(H) - 111  CO2 20 - 29 mmol/L 20 - 24  Calcium 8.6 - 10.2 mg/dL 8.9 - 8.4(L)  Total Protein 6.0 - 8.5 g/dL 5.8(L) 6.2 -  Total Bilirubin 0.0 - 1.2 mg/dL 0.9 - -  Alkaline Phos 39 - 117 IU/L 50 - -  AST 0 - 40 IU/L 32 - -  ALT 0 - 44 IU/L 23 - -    Lab Results  Component Value Date   WBC 4.1 09/17/2016   HGB 10.6 (L) 09/17/2016   HCT 31.6 (L) 09/17/2016   MCV 96 09/17/2016   PLT 41 (LL) 09/17/2016   NEUTROABS 2.7 09/17/2016      Assessment Plan:  Pancytopenia, acquired ([place]) Primarily thrombocytopenia: Previously worked up by Dr. 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  The cirrhosis was felt to be related to mesenteric vascular/portal vein thrombosis which occurred in 1996. Jak 2 mutation testing: Normal Normocytic anemia evaluation: Erythropoietin 78.5: Elevated, SPEP: Negative for M protein  Labs:  06/11/2018 at [place]: WBC 3.6 with normal differential, hemoglobin 10.5 with MCV of 99, platelets 36, creatinine 1.35 03/13/2018: WBC 4.1, hemoglobin  10.8, platelets 40 09/17/2016: WBC 4.1, hemoglobin 10.6, platelets 41 11/21/2015: WBC 4.2, hemoglobin 11, platelets 48  Longstanding chronic thrombocytopenia: Previously worked up and felt to be related to cirrhosis of the liver.  I do not recommend any further work-up because it is clearly related to the splenomegaly. Unless there is any bleeding issue, we do not need to transfuse platelets. There is no definite treatment for his condition and only watchful monitoring is recommended.  If he runs into bleeding problems please do not hesitate to contact us.  We are happy to see him on an as-needed basis.   I discussed the assessment and treatment plan with the patient. The patient was provided an opportunity to ask questions and all were answered. The patient agreed with the plan and demonstrated an understanding of the instructions. The patient was advised to call back or seek an in-person evaluation if the symptoms worsen or if the condition fails to improve as anticipated.   I provided 35 minutes of face-to-face Web Ex time during this encounter.    Harriette Ohara, MD  06/19/2018

## 2018-06-19 ENCOUNTER — Ambulatory Visit (HOSPITAL_BASED_OUTPATIENT_CLINIC_OR_DEPARTMENT_OTHER): Payer: Medicare Other | Admitting: Hematology and Oncology

## 2018-06-19 DIAGNOSIS — D61818 Other pancytopenia: Secondary | ICD-10-CM

## 2018-06-19 NOTE — Assessment & Plan Note (Signed)
Primarily thrombocytopenia: Previously worked up by Dr. 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  The cirrhosis was felt to be related to mesenteric vascular/portal vein thrombosis which occurred in 1996. Jak 2 mutation testing: Normal Normocytic anemia evaluation: Erythropoietin 78.5: Elevated, SPEP: Negative for M protein  Labs:  06/11/2018 at [place]: WBC 3.6 with normal differential, hemoglobin 10.5 with MCV of 99, platelets 36, creatinine 1.35 03/13/2018: WBC 4.1, hemoglobin 10.8, platelets 40 09/17/2016: WBC 4.1, hemoglobin 10.6, platelets 41 11/21/2015: WBC 4.2, hemoglobin 11, platelets 48  Longstanding chronic thrombocytopenia: Previously worked up and felt to be related to cirrhosis of the liver.  I do not recommend any further work-up because it is clearly related to the splenomegaly. Unless there is any bleeding issue, we do not need to transfuse platelets. There is no definite treatment for his condition and only watchful monitoring is recommended.  We can monitor his blood counts every 6 months for follow-up.

## 2018-07-02 DIAGNOSIS — D693 Immune thrombocytopenic purpura: Secondary | ICD-10-CM | POA: Diagnosis not present

## 2018-07-02 DIAGNOSIS — D696 Thrombocytopenia, unspecified: Secondary | ICD-10-CM | POA: Diagnosis not present

## 2018-07-02 DIAGNOSIS — D649 Anemia, unspecified: Secondary | ICD-10-CM | POA: Diagnosis not present

## 2018-07-02 DIAGNOSIS — I10 Essential (primary) hypertension: Secondary | ICD-10-CM | POA: Diagnosis not present

## 2018-07-02 DIAGNOSIS — E119 Type 2 diabetes mellitus without complications: Secondary | ICD-10-CM | POA: Diagnosis not present

## 2018-08-04 DIAGNOSIS — I10 Essential (primary) hypertension: Secondary | ICD-10-CM | POA: Diagnosis not present

## 2018-08-04 DIAGNOSIS — E119 Type 2 diabetes mellitus without complications: Secondary | ICD-10-CM | POA: Diagnosis not present

## 2018-08-10 ENCOUNTER — Other Ambulatory Visit: Payer: Self-pay

## 2018-08-10 DIAGNOSIS — I6523 Occlusion and stenosis of bilateral carotid arteries: Secondary | ICD-10-CM

## 2018-08-13 ENCOUNTER — Encounter: Payer: Self-pay | Admitting: Family

## 2018-08-13 ENCOUNTER — Ambulatory Visit: Payer: Medicare Other | Admitting: Family

## 2018-08-13 ENCOUNTER — Ambulatory Visit (HOSPITAL_COMMUNITY)
Admission: RE | Admit: 2018-08-13 | Discharge: 2018-08-13 | Disposition: A | Discharge status: Home / Self Care | Payer: Medicare Other | Source: Ambulatory Visit | Attending: Family | Admitting: Family

## 2018-08-13 ENCOUNTER — Other Ambulatory Visit: Payer: Self-pay

## 2018-08-13 VITALS — BP 139/58 | HR 63 | Temp 97.9°F | Resp 14 | Ht 70.0 in | Wt 162.5 lb

## 2018-08-13 DIAGNOSIS — Z8673 Personal history of transient ischemic attack (TIA), and cerebral infarction without residual deficits: Secondary | ICD-10-CM

## 2018-08-13 DIAGNOSIS — I6523 Occlusion and stenosis of bilateral carotid arteries: Secondary | ICD-10-CM

## 2018-08-13 NOTE — Progress Notes (Signed)
Chief Complaint: Follow up Extracranial Carotid Artery Stenosis   History of Present Illness  [name] is a 78 y.o. male whom Dr. Scot Dock has beenmonitroingfor extracranialcarotid artery stenosis which is been relatively stable.   He had a "mild" stroke about 2005 which manifested as dizziness and bilateral arms weakness, but no stroke or TIA symptoms since then, states it was noted on either CT or MRI which also found the carotid artery stenosis.  He still has balance issues if he turns too quickly.  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 Lajoyce Corners, he had no stents or surgeries for this.  Since he was on the anticoagulants, he has developed low platelets which is managed by Dr. Beryle Beams.  Patient states he has not had any stomach pain since treated for the mesenteric vein thrombosis.[name]  He did lose weight before mesenteric vein thrombosis treatment, but gained it back afterward.  Patient denies any cardiac problems or dyspnea.  The patient denies a history of amaurosis fugax or monocular blindness, unilateral facial drooping, hemiplegia, or receptive or expressive aphasia.  He denies claudication symptomswith walking. He lost his sense of taste since early 2016, seeing Dr. Jannifer Franklin, neurologist, re this.Wife reported at a previous visit that he is not eating as well as he should due to this. Pt denies post prandial abdominal pain.  He was hospitalized at [facility] January, 2015 for cellulitis and DVT in RLE and ulceration which has healed.  He does not have any clotting disorders that he is aware of.  He has been diagnosed with cirrhosis andhisgastroenterologist was recommending discontinuing his Coumadin whichDr. Golden Hurter.  Dr. Scot Dock last evaluated pt on 07-03-16. At that timethe patient hada stable  60-79% right carotid stenosis. The end-diastolic velocity was 79 cm/s suggesting thiswas in the mid range. Hewas asymptomatic. He is on a statin.Dr. Legrand Como favor discontinuing his Coumadin and starting him on aspirin 81 mg daily.Pt was tofollow in 6 months with a carotid duplexand we will see him back at that time.   He has been physically active lately working on his son's house.    Diabetic: Yes, 6.6 A1C on in April 2020 (review of records), in good control  Tobacco use: non-smoker  Pt meds include:  Statin : Yes  Betablocker: No  ASA:81 mg daily Other anticoagulants/antiplatelets:no, he had previously been on coumadin for DVT in both legs   Past Medical History:  Diagnosis Date  . Anemia   . Arthritis    "hands" (09/27/2015)  . Carotid artery occlusion   . Cellulitis and abscess of leg 03/19/2013   RIGHT LEG HEALED  . 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 Cirrhosis of liver ([place])   . Clotting disorder ([place])   . Colon polyps   . CVA (cerebral vascular accident) ([place]) ~ 2000   "minor stroke", denies residual on 09/27/2015  . DVT (deep venous thrombosis) ([place]) 03/20/2013   RIGHT LEG AND LEFT LEG  . DVT (deep venous thrombosis) ([place]) 02/2013   RLE  . Esophageal varices ([place])   . Essential hypertension, benign 03/20/2013  . History of blood transfusion 1998   "blood clot began bleeding"  . Hyperlipidemia   . 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 Leukopenia 08/11/2012  . Mesenteric venous thrombosis 1996  . Mitral regurgitation   . Pancytopenia, acquired ([place]) 09/17/2016  Due to non alcoholic cirhosis  . Splenomegaly   . Type II diabetes mellitus ([place]) dx'd in the 1990s    Social History Social History   Tobacco Use  . Smoking status: Never Smoker  . Smokeless tobacco: Never Used  Substance Use Topics  . Alcohol use: No    Alcohol/week: 0.0 standard drinks  . Drug use: No    Family History Family  History  Problem Relation Age of Onset  . Alzheimer's disease Father   . Dementia Father   . Cancer Sister        unknown type  . Diabetes Sister   . Deep vein thrombosis Sister        Varicose vein  . Heart disease Mother        After age 79  . Hypertension Mother   . Pancreatic cancer Son     Surgical History Past Surgical History:  Procedure Laterality Date  . BIOPSY  10/14/2017   Procedure: BIOPSY;  Surgeon: Ladene Artist, MD;  Location: [facility];  Service: Endoscopy;;  . CATARACT EXTRACTION W/ INTRAOCULAR LENS IMPLANT Right 06/26/2012  . CATARACT EXTRACTION W/ INTRAOCULAR LENS IMPLANT Left 07/27/2012  . COLONOSCOPY    . ESOPHAGOGASTRODUODENOSCOPY (EGD) WITH PROPOFOL N/A 09/03/2016   Procedure: ESOPHAGOGASTRODUODENOSCOPY (EGD) WITH PROPOFOL;  Surgeon: Ladene Artist, MD;  Location: [facility];  Service: Endoscopy;  Laterality: N/A;  . ESOPHAGOGASTRODUODENOSCOPY (EGD) WITH PROPOFOL N/A 10/14/2017   Procedure: ESOPHAGOGASTRODUODENOSCOPY (EGD) WITH PROPOFOL;  Surgeon: Ladene Artist, MD;  Location: [facility];  Service: Endoscopy;  Laterality: N/A;    No Known Allergies  Current Outpatient Medications  Medication Sig Dispense Refill  . aspirin EC 81 MG tablet Take 81 mg by mouth daily.    [name] atorvastatin (LIPITOR) 40 MG tablet Take 40 mg by mouth every evening.     . calcium carbonate (TUMS - DOSED IN MG ELEMENTAL CALCIUM) 500 MG chewable tablet Chew 1 tablet by mouth daily as needed for indigestion or heartburn.    . cycloSPORINE (RESTASIS) 0.05 % ophthalmic emulsion Place 1 drop into both eyes 2 (two) times daily as needed (for dry eyes).    . ferrous sulfate 325 (65 FE) MG EC tablet Take 1 tablet (325 mg total) by mouth 2 (two) times daily with a meal. 60 tablet 3  . glipiZIDE (GLUCOTROL) 10 MG tablet Take 10 mg by mouth 2 (two) times daily before a meal. Take 10 mg by mouth in the morning and take 10 mg by mouth in the evening    . linagliptin (TRADJENTA) 5 MG  TABS tablet Take 5 mg by mouth daily.    [name] losartan (COZAAR) 50 MG tablet Take 50 mg by mouth daily.     . Lutein 6 MG TABS Take 6 mg by mouth daily.    . metFORMIN (GLUCOPHAGE) 1000 MG tablet Take 1,000 mg by mouth 2 (two) times daily with a meal.    . omeprazole (PRILOSEC) 40 MG capsule Take 1 capsule (40 mg total) by mouth daily. 30 capsule 11   No current facility-administered medications for this visit.     Review of Systems : See HPI for pertinent positives and negatives.  Physical Examination  Vitals:   08/13/18 1042 08/13/18 1043  BP: (!) 143/61 (!) 139/58  Pulse: 63   Resp: 14   Temp: 97.9 F (36.6 C)   TempSrc: Temporal   SpO2: 99%   Weight: 162 lb 8 oz (73.7 kg)   Height: 5' 10"  (  1.778 m)    Body mass index is 23.32 kg/m.  General: WDWN male in NAD GAIT: normal Eyes: PERRLA HENT: No gross abnormalities.  Pulmonary:  Respirations are non-labored, good air movement in all fields, CTAB, no rales,  rhonchi, or wheezing. Cardiac: regular rhythm, no detected murmur.  VASCULAR EXAM Carotid Bruits Right Left   Positive Positive     Abdominal aortic pulse is not palpable. Radial pulses are 2+ palpable and equal.                                                                                                                            LE Pulses Right Left       POPLITEAL  not palpable   not palpable       POSTERIOR TIBIAL  1+ palpable   1+ palpable        DORSALIS PEDIS      ANTERIOR TIBIAL 1+ palpable  not palpable    Gastrointestinal: soft, nontender, BS WNL, no r/g, no palpable masses. Musculoskeletal: no muscle atrophy/wasting. M/S 5/5 throughout, extremities without ischemic changes Skin: No rashes, no ulcers, no cellulitis.   Neurologic:  A&O X 3; appropriate affect, sensation is normal; speech is normal, CN 2-12 intact except has some hearing loss, pain and light touch intact in extremities, motor exam as listed above. Psychiatric: Normal thought  content, mood appropriate to clinical situation.    Assessment: BERLIN MOKRY is a 78 y.o. male who whohad an ischemic stroke in 2005, has mildresidual neurological deficits, and has had no subsequent stroke or TIA.  Fortunately his DM remains in good control and he has never used tobacco.  He takes a daily statin and 81 mg ASA.    DATA Carotid Duplex (08-13-18): Right Carotid: Velocities in the right ICA are consistent with a 60-79% stenosis. Left Carotid: Velocities in the left ICA are consistent with a 40-59% stenosis. Vertebrals:  Bilateral vertebral arteries demonstrate antegrade flow. Subclavians: Normal flow hemodynamics were seen in bilateral subclavian arteries. No change compared to the exams on 01-17-17, 07-15-17, and 02-16-18.    MRI of Brain, non contrast (11-09-14): IMPRESSION:  Abnormal MRI brain (without) demonstrating: 1. 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 2. No acute findings. 3. No significant change from MRI on 11/12/06.   Plan: Follow-up in 6 months with Carotid Duplex scan.   I discussed in depth with the patient the nature of atherosclerosis, and emphasized the importance of maximal medical management including strict control of blood pressure, blood glucose, and lipid levels, obtaining regular exercise, and continued cessation of smoking.  The patient is aware that without maximal medical management the underlying atherosclerotic disease process will progress, limiting the benefit of any interventions. The patient was given information about stroke prevention and what symptoms should prompt the patient to seek immediate medical care. Thank you for allowing [place] to participate in this patient's care.  Vinnie Level Devonte Migues, RN, MSN, FNP-C [facility] Office: [phone]  Clinic Physician: Laqueta Due  08/13/18 11:36 AM

## 2018-08-13 NOTE — Patient Instructions (Signed)

## 2018-09-22 ENCOUNTER — Other Ambulatory Visit: Payer: Self-pay

## 2018-09-22 NOTE — Patient Outreach (Signed)
[facility]) Care Management  09/22/2018  [name] [birth year] [id number]   Medication Adherence call to Mr. [name] patient wants a call back pt is past due on Atorvastatin 40 mg under [place].   [place] Management Direct Dial [phone]  Fax [phone] Tip [email or web address]

## 2018-10-06 DIAGNOSIS — E039 Hypothyroidism, unspecified: Secondary | ICD-10-CM | POA: Diagnosis not present

## 2018-12-04 DIAGNOSIS — E039 Hypothyroidism, unspecified: Secondary | ICD-10-CM | POA: Diagnosis not present

## 2018-12-04 DIAGNOSIS — I10 Essential (primary) hypertension: Secondary | ICD-10-CM | POA: Diagnosis not present

## 2018-12-04 DIAGNOSIS — E119 Type 2 diabetes mellitus without complications: Secondary | ICD-10-CM | POA: Diagnosis not present

## 2018-12-24 DIAGNOSIS — I10 Essential (primary) hypertension: Secondary | ICD-10-CM | POA: Diagnosis not present

## 2018-12-24 DIAGNOSIS — E119 Type 2 diabetes mellitus without complications: Secondary | ICD-10-CM | POA: Diagnosis not present

## 2018-12-24 DIAGNOSIS — E7800 Pure hypercholesterolemia, unspecified: Secondary | ICD-10-CM | POA: Diagnosis not present

## 2018-12-31 DIAGNOSIS — I10 Essential (primary) hypertension: Secondary | ICD-10-CM | POA: Diagnosis not present

## 2018-12-31 DIAGNOSIS — E119 Type 2 diabetes mellitus without complications: Secondary | ICD-10-CM | POA: Diagnosis not present

## 2018-12-31 DIAGNOSIS — E7800 Pure hypercholesterolemia, unspecified: Secondary | ICD-10-CM | POA: Diagnosis not present

## 2018-12-31 DIAGNOSIS — I350 Nonrheumatic aortic (valve) stenosis: Secondary | ICD-10-CM | POA: Diagnosis not present

## 2019-01-01 DIAGNOSIS — H40013 Open angle with borderline findings, low risk, bilateral: Secondary | ICD-10-CM | POA: Diagnosis not present

## 2019-01-01 DIAGNOSIS — H353131 Nonexudative age-related macular degeneration, bilateral, early dry stage: Secondary | ICD-10-CM | POA: Diagnosis not present

## 2019-01-01 DIAGNOSIS — H18513 Endothelial corneal dystrophy, bilateral: Secondary | ICD-10-CM | POA: Diagnosis not present

## 2019-01-01 DIAGNOSIS — H1045 Other chronic allergic conjunctivitis: Secondary | ICD-10-CM | POA: Diagnosis not present

## 2019-01-12 ENCOUNTER — Other Ambulatory Visit: Payer: Self-pay

## 2019-01-12 NOTE — Patient Outreach (Signed)
[place]) Care Management  01/12/2019  Beverley Sherrard Yingst 12-11-1940 [id number]   Medication Adherence call to Mr. [place] Compliant Voice message left with a call back number. Mr. Rathbone is showing past due on Atorvastatin 40 mg under [employer].   [place] Management Direct Dial [phone]  Fax [phone] [email or web address]

## 2019-01-19 ENCOUNTER — Other Ambulatory Visit: Payer: Self-pay

## 2019-01-19 NOTE — Patient Outreach (Signed)
[facility]) Care Management  01/19/2019  [name] 03-05-1940 [id number]   Medication Adherence call to Mr. Warren Geissinger [place] Identifiers Verify spoke with patients wife,patient is past due on Atorvastatin 40 mg and Losartan 50 mg,wife explain he is taking both medication once daily,she explain on Losartan he has about 12 more days on Atorvastatin 40 mg he does not have any but will place an order thru the pharmacy. Mr. Glance is showing past due under [place].   [facility] Management Direct Dial [phone]  Fax [phone] [email or web address]

## 2019-06-02 DIAGNOSIS — I10 Essential (primary) hypertension: Secondary | ICD-10-CM | POA: Diagnosis not present

## 2019-06-02 DIAGNOSIS — E7800 Pure hypercholesterolemia, unspecified: Secondary | ICD-10-CM | POA: Diagnosis not present

## 2019-06-02 DIAGNOSIS — E039 Hypothyroidism, unspecified: Secondary | ICD-10-CM | POA: Diagnosis not present

## 2019-06-02 DIAGNOSIS — E119 Type 2 diabetes mellitus without complications: Secondary | ICD-10-CM | POA: Diagnosis not present

## 2019-06-24 DIAGNOSIS — E119 Type 2 diabetes mellitus without complications: Secondary | ICD-10-CM | POA: Diagnosis not present

## 2019-06-24 DIAGNOSIS — I10 Essential (primary) hypertension: Secondary | ICD-10-CM | POA: Diagnosis not present

## 2019-07-15 DIAGNOSIS — D693 Immune thrombocytopenic purpura: Secondary | ICD-10-CM | POA: Diagnosis not present

## 2019-07-15 DIAGNOSIS — I10 Essential (primary) hypertension: Secondary | ICD-10-CM | POA: Diagnosis not present

## 2019-07-15 DIAGNOSIS — Z0000 Encounter for general adult medical examination without abnormal findings: Secondary | ICD-10-CM | POA: Diagnosis not present

## 2019-07-15 DIAGNOSIS — E7800 Pure hypercholesterolemia, unspecified: Secondary | ICD-10-CM | POA: Diagnosis not present

## 2019-07-15 DIAGNOSIS — E039 Hypothyroidism, unspecified: Secondary | ICD-10-CM | POA: Diagnosis not present

## 2020-01-03 DIAGNOSIS — H524 Presbyopia: Secondary | ICD-10-CM | POA: Diagnosis not present

## 2020-01-03 DIAGNOSIS — H40013 Open angle with borderline findings, low risk, bilateral: Secondary | ICD-10-CM | POA: Diagnosis not present

## 2020-01-03 DIAGNOSIS — H353131 Nonexudative age-related macular degeneration, bilateral, early dry stage: Secondary | ICD-10-CM | POA: Diagnosis not present

## 2020-01-03 DIAGNOSIS — E119 Type 2 diabetes mellitus without complications: Secondary | ICD-10-CM | POA: Diagnosis not present

## 2020-01-03 DIAGNOSIS — H0102A Squamous blepharitis right eye, upper and lower eyelids: Secondary | ICD-10-CM | POA: Diagnosis not present

## 2020-01-04 DIAGNOSIS — E119 Type 2 diabetes mellitus without complications: Secondary | ICD-10-CM | POA: Diagnosis not present

## 2020-01-04 DIAGNOSIS — E039 Hypothyroidism, unspecified: Secondary | ICD-10-CM | POA: Diagnosis not present

## 2020-01-04 DIAGNOSIS — I10 Essential (primary) hypertension: Secondary | ICD-10-CM | POA: Diagnosis not present

## 2020-01-04 DIAGNOSIS — E7800 Pure hypercholesterolemia, unspecified: Secondary | ICD-10-CM | POA: Diagnosis not present

## 2020-01-04 DIAGNOSIS — D696 Thrombocytopenia, unspecified: Secondary | ICD-10-CM | POA: Diagnosis not present

## 2020-01-11 DIAGNOSIS — E039 Hypothyroidism, unspecified: Secondary | ICD-10-CM | POA: Diagnosis not present

## 2020-01-11 DIAGNOSIS — E7800 Pure hypercholesterolemia, unspecified: Secondary | ICD-10-CM | POA: Diagnosis not present

## 2020-01-11 DIAGNOSIS — D693 Immune thrombocytopenic purpura: Secondary | ICD-10-CM | POA: Diagnosis not present

## 2020-01-11 DIAGNOSIS — E119 Type 2 diabetes mellitus without complications: Secondary | ICD-10-CM | POA: Diagnosis not present

## 2020-01-11 DIAGNOSIS — I10 Essential (primary) hypertension: Secondary | ICD-10-CM | POA: Diagnosis not present

## 2020-01-20 ENCOUNTER — Other Ambulatory Visit: Payer: Self-pay

## 2020-01-20 DIAGNOSIS — I6523 Occlusion and stenosis of bilateral carotid arteries: Secondary | ICD-10-CM

## 2020-01-24 DIAGNOSIS — E039 Hypothyroidism, unspecified: Secondary | ICD-10-CM | POA: Diagnosis not present

## 2020-01-24 DIAGNOSIS — I10 Essential (primary) hypertension: Secondary | ICD-10-CM | POA: Diagnosis not present

## 2020-01-24 DIAGNOSIS — E119 Type 2 diabetes mellitus without complications: Secondary | ICD-10-CM | POA: Diagnosis not present

## 2020-01-24 DIAGNOSIS — E7800 Pure hypercholesterolemia, unspecified: Secondary | ICD-10-CM | POA: Diagnosis not present

## 2020-01-26 ENCOUNTER — Ambulatory Visit (INDEPENDENT_AMBULATORY_CARE_PROVIDER_SITE_OTHER): Payer: Medicare Other | Admitting: Physician Assistant

## 2020-01-26 ENCOUNTER — Ambulatory Visit (HOSPITAL_COMMUNITY)
Admission: RE | Admit: 2020-01-26 | Discharge: 2020-01-26 | Disposition: A | Discharge status: Home / Self Care | Payer: Medicare Other | Source: Ambulatory Visit | Attending: Physician Assistant | Admitting: Physician Assistant

## 2020-01-26 ENCOUNTER — Other Ambulatory Visit: Payer: Self-pay

## 2020-01-26 VITALS — BP 158/60 | HR 71 | Temp 98.3°F | Resp 20 | Ht 70.0 in | Wt 169.6 lb

## 2020-01-26 DIAGNOSIS — I6523 Occlusion and stenosis of bilateral carotid arteries: Secondary | ICD-10-CM

## 2020-01-26 NOTE — Progress Notes (Signed)
Office Note     CC:  follow up Requesting Provider:  Jani Gravel, MD  HPI: [name] is a 79 y.o. (Dec 29, 1940) male who presents for follow up of carotid stenosis. He has been followed for several years now by Dr. Scot Dock. He does have history of TIA in 2005 with transient dizziness and bilateral arm weakness. He has not had any symptoms since then. Today he denies any amaurosis fugax or monocular blindness, facial drooping, slurred speech, weakness or numbness of upper or lower extremities. He reports he just had his eye exam and that he had very minimal vision change. He reports he has had several episodes of dizziness when he bends over and sits up too quickly or if he bends over to pick something up and stands up to quickly but this does not occur frequently. He otherwise says he stays very active. He is still helping his son build a house. He reports no claudication symptoms, rest pain or non healing wounds. He does have some swelling of his lower extremities. This is improved with elevation and compression stockings  The pt is on a statin for cholesterol management.  The pt  on a daily aspirin.   Other AC: none The pt is on ARB for hypertension.   The pt is diabetic. Reports A1C of 6 Tobacco hx:  never  Past Medical History:  Diagnosis Date  . Anemia   . Arthritis    "hands" (09/27/2015)  . Carotid artery occlusion   . Cellulitis and abscess of leg 03/19/2013   RIGHT LEG HEALED  . 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 Cirrhosis of liver ([place])   . Clotting disorder ([place])   . Colon polyps   . CVA (cerebral vascular accident) ([place]) ~ 2000   "minor stroke", denies residual on 09/27/2015  . DVT (deep venous thrombosis) ([place]) 03/20/2013   RIGHT LEG AND LEFT LEG  . DVT (deep venous thrombosis) ([place]) 02/2013   RLE  . Esophageal varices ([place])   . Essential hypertension, benign 03/20/2013  . History of blood transfusion 1998   "blood clot began bleeding"   . Hyperlipidemia   . 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 Leukopenia 08/11/2012  . 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 Mitral regurgitation   . Pancytopenia, acquired ([place]) 09/17/2016   Due to non alcoholic cirhosis  . Splenomegaly   . Type II diabetes mellitus ([place]) dx'd in the 1990s    Past Surgical History:  Procedure Laterality Date  . BIOPSY  10/14/2017   Procedure: BIOPSY;  Surgeon: Ladene Artist, MD;  Location: [facility];  Service: Endoscopy;;  . CATARACT EXTRACTION W/ INTRAOCULAR LENS IMPLANT Right 06/26/2012  . CATARACT EXTRACTION W/ INTRAOCULAR LENS IMPLANT Left 07/27/2012  . COLONOSCOPY    . ESOPHAGOGASTRODUODENOSCOPY (EGD) WITH PROPOFOL N/A 09/03/2016   Procedure: ESOPHAGOGASTRODUODENOSCOPY (EGD) WITH PROPOFOL;  Surgeon: Ladene Artist, MD;  [facility];  Service: Endoscopy;  Laterality: N/A;  . ESOPHAGOGASTRODUODENOSCOPY (EGD) WITH PROPOFOL N/A 10/14/2017   Procedure: ESOPHAGOGASTRODUODENOSCOPY (EGD) WITH PROPOFOL;  Surgeon: Ladene Artist, MD;  [facility];  Service: Endoscopy;  Laterality: N/A;    Social History   Socioeconomic History  . Marital status: Married    Spouse name: Not on file  . Number of children: 2  . Years of education: 86  . Highest education level: Not on file  Occupational History  . Occupation: retired  Tobacco Use  . Smoking status: Never  Smoker  . Smokeless tobacco: Never Used  Vaping Use  . Vaping Use: Never used  Substance and Sexual Activity  . Alcohol use: No    Alcohol/week: 0.0 standard drinks  . Drug use: No  . Sexual activity: Never  Other Topics Concern  . Not on file  Social History Narrative   Patient drinks 1 cup of coffee daily.   Patient is left handed.   Social Determinants of Health   Financial Resource Strain:   . Difficulty of Paying Living Expenses: Not on file  Food Insecurity:   . Worried About Charity fundraiser in the Last Year: Not on file  . Ran  Out of Food in the Last Year: Not on file  Transportation Needs:   . Lack of Transportation (Medical): Not on file  . Lack of Transportation (Non-Medical): Not on file  Physical Activity:   . Days of Exercise per Week: Not on file  . Minutes of Exercise per Session: Not on file  Stress:   . Feeling of Stress : Not on file  Social Connections:   . Frequency of Communication with Friends and Family: Not on file  . Frequency of Social Gatherings with Friends and Family: Not on file  . Attends Religious Services: Not on file  . Active Member of Clubs or Organizations: Not on file  . Attends Archivist Meetings: Not on file  . Marital Status: Not on file  Intimate Partner Violence:   . Fear of Current or Ex-Partner: Not on file  . Emotionally Abused: Not on file  . Physically Abused: Not on file  . Sexually Abused: Not on file    Family History  Problem Relation Age of Onset  . Alzheimer's disease Father   . Dementia Father   . Cancer Sister        unknown type  . Diabetes Sister   . Deep vein thrombosis Sister        Varicose vein  . Heart disease Mother        After age 52  . Hypertension Mother   . Pancreatic cancer Son     Current Outpatient Medications  Medication Sig Dispense Refill  . aspirin EC 81 MG tablet Take 81 mg by mouth daily.    [name] atorvastatin (LIPITOR) 40 MG tablet Take 40 mg by mouth every evening.     . calcium carbonate (TUMS - DOSED IN MG ELEMENTAL CALCIUM) 500 MG chewable tablet Chew 1 tablet by mouth daily as needed for indigestion or heartburn.    . cycloSPORINE (RESTASIS) 0.05 % ophthalmic emulsion Place 1 drop into both eyes 2 (two) times daily as needed (for dry eyes).    . ferrous sulfate 325 (65 FE) MG EC tablet Take 1 tablet (325 mg total) by mouth 2 (two) times daily with a meal. 60 tablet 3  . glimepiride (AMARYL) 2 MG tablet Take 2 mg by mouth every morning.    . linagliptin (TRADJENTA) 5 MG TABS tablet Take 5 mg by mouth daily.     [name] losartan (COZAAR) 50 MG tablet Take 50 mg by mouth daily.     . Lutein 6 MG TABS Take 6 mg by mouth daily.    . metFORMIN (GLUCOPHAGE) 1000 MG tablet Take 1,000 mg by mouth 2 (two) times daily with a meal.    . ONETOUCH ULTRA test strip daily.     No current facility-administered medications for this visit.    No Known Allergies  REVIEW OF SYSTEMS:  [X]  denotes positive finding, [ ]  denotes negative finding Cardiac  Comments:  Chest pain or chest pressure:    Shortness of breath upon exertion:    Short of breath when lying flat:    Irregular heart rhythm:        Vascular    Pain in calf, thigh, or hip brought on by ambulation:    Pain in feet at night that wakes you up from your sleep:     Blood clot in your veins:    Leg swelling:         Pulmonary    Oxygen at home:    Productive cough:     Wheezing:         Neurologic    Sudden weakness in arms or legs:     Sudden numbness in arms or legs:     Sudden onset of difficulty speaking or slurred speech:    Temporary loss of vision in one eye:     Problems with dizziness:         Gastrointestinal    Blood in stool:     Vomited blood:         Genitourinary    Burning when urinating:     Blood in urine:        Psychiatric    Major depression:         Hematologic    Bleeding problems:    Problems with blood clotting too easily:        Skin    Rashes or ulcers:        Constitutional    Fever or chills:      PHYSICAL EXAMINATION:  Vitals:   01/26/20 1453 01/26/20 1455  BP: (!) 154/62 (!) 158/60  Pulse: 71   Resp: 20   Temp: 98.3 F (36.8 C)   TempSrc: Temporal   SpO2: 98%   Weight: 169 lb 9.6 oz (76.9 kg)   Height: 5' 10"  (1.778 m)     General:  WDWN in NAD; vital signs documented above Gait: Normal HENT: WNL, normocephalic Pulmonary: normal non-labored breathing , without wheezing Cardiac: regular HR, without  Murmurs with carotid bruit bilaterally Abdomen: soft, NT, no masses Vascular  Exam/Pulses:  Right Left  Radial 2+ (normal) 2+ (normal)  Femoral 2+ (normal) 2+ (normal)  Popliteal 2+ (normal) 2+ (normal)  DP 2+ (normal) 2+ (normal)  PT 2+ (normal) 2+ (normal)   Extremities: without ischemic changes, without Gangrene , without cellulitis; without open wounds;  Musculoskeletal: no muscle wasting or atrophy  Neurologic: A&O X 3;  No focal weakness or paresthesias are detected Psychiatric:  The pt has Normal affect.   Non-Invasive Vascular Imaging:   Right Carotid: Velocities in the right ICA are consistent with a 60-79% stenosis.   Left Carotid: Velocities in the left ICA are consistent with a 40-59% stenosis.   Vertebrals: Bilateral vertebral arteries demonstrate antegrade flow.    ASSESSMENT/PLAN:: 79 y.o. male here for follow up for carotid stenosis. He remains asymptomatic. His duplex today is stable with right ICA stenosis of 60-79% and left ICA stenosis of 40-59%. The velocities are essentially unchanged from prior imaging. He remains very active. He is managed on Aspirin and Statin. - Reviewed signs and symptoms of TIA/ stroke and he understands that should these occur he will seek immediate medical attention - he will follow up in 6 months with repeat carotid duplex   Karoline Caldwell, PA-C [facility] [phone]  Clinic MD:  Dr. Donzetta Matters

## 2020-01-27 ENCOUNTER — Other Ambulatory Visit: Payer: Self-pay

## 2020-01-27 DIAGNOSIS — I6523 Occlusion and stenosis of bilateral carotid arteries: Secondary | ICD-10-CM

## 2020-07-14 DIAGNOSIS — I10 Essential (primary) hypertension: Secondary | ICD-10-CM | POA: Diagnosis not present

## 2020-07-14 DIAGNOSIS — Z0000 Encounter for general adult medical examination without abnormal findings: Secondary | ICD-10-CM | POA: Diagnosis not present

## 2020-07-14 DIAGNOSIS — E118 Type 2 diabetes mellitus with unspecified complications: Secondary | ICD-10-CM | POA: Diagnosis not present

## 2020-07-14 DIAGNOSIS — E119 Type 2 diabetes mellitus without complications: Secondary | ICD-10-CM | POA: Diagnosis not present

## 2020-07-14 DIAGNOSIS — E7800 Pure hypercholesterolemia, unspecified: Secondary | ICD-10-CM | POA: Diagnosis not present

## 2020-07-19 DIAGNOSIS — I10 Essential (primary) hypertension: Secondary | ICD-10-CM | POA: Diagnosis not present

## 2020-07-19 DIAGNOSIS — D693 Immune thrombocytopenic purpura: Secondary | ICD-10-CM | POA: Diagnosis not present

## 2020-07-19 DIAGNOSIS — E7800 Pure hypercholesterolemia, unspecified: Secondary | ICD-10-CM | POA: Diagnosis not present

## 2020-07-19 DIAGNOSIS — E118 Type 2 diabetes mellitus with unspecified complications: Secondary | ICD-10-CM | POA: Diagnosis not present

## 2020-07-19 DIAGNOSIS — K7460 Unspecified cirrhosis of liver: Secondary | ICD-10-CM | POA: Diagnosis not present

## 2020-07-19 DIAGNOSIS — E039 Hypothyroidism, unspecified: Secondary | ICD-10-CM | POA: Diagnosis not present

## 2020-07-19 DIAGNOSIS — Z0000 Encounter for general adult medical examination without abnormal findings: Secondary | ICD-10-CM | POA: Diagnosis not present

## 2020-07-24 DIAGNOSIS — E7800 Pure hypercholesterolemia, unspecified: Secondary | ICD-10-CM | POA: Diagnosis not present

## 2020-07-24 DIAGNOSIS — I10 Essential (primary) hypertension: Secondary | ICD-10-CM | POA: Diagnosis not present

## 2020-07-24 DIAGNOSIS — E039 Hypothyroidism, unspecified: Secondary | ICD-10-CM | POA: Diagnosis not present

## 2020-07-24 DIAGNOSIS — E119 Type 2 diabetes mellitus without complications: Secondary | ICD-10-CM | POA: Diagnosis not present

## 2020-08-07 ENCOUNTER — Other Ambulatory Visit: Payer: Self-pay

## 2020-08-07 ENCOUNTER — Ambulatory Visit (HOSPITAL_COMMUNITY)
Admission: RE | Admit: 2020-08-07 | Discharge: 2020-08-07 | Disposition: A | Discharge status: Home / Self Care | Payer: Medicare Other | Source: Ambulatory Visit | Attending: Surgery | Admitting: Surgery

## 2020-08-07 ENCOUNTER — Ambulatory Visit: Payer: Medicare Other | Admitting: Physician Assistant

## 2020-08-07 VITALS — BP 128/61 | HR 63 | Temp 97.5°F | Resp 18 | Ht 70.0 in | Wt 160.0 lb

## 2020-08-07 DIAGNOSIS — I6523 Occlusion and stenosis of bilateral carotid arteries: Secondary | ICD-10-CM | POA: Insufficient documentation

## 2020-08-07 NOTE — Progress Notes (Signed)
Office Note     CC:  follow up Requesting Provider:  Jani Gravel, MD  HPI: [name] is a 80 y.o. (19-Nov-1940) male who presents for surveillance of carotid artery stenosis.  He has history of TIA in 2005 however has no diagnosis of TIA or CVA since that time.  Since last office visit 6 months ago he denies any neurological symptoms including slurring speech, changes in vision, or one-sided weakness.  He is on an aspirin and a statin daily.  He is ambulatory without assistance.  He lives with his wife.  He denies tobacco use.   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   Cirrhosis of liver ([facility])    Clotting disorder ([facility])    Colon polyps    CVA (cerebral vascular accident) ([place]) ~ 2000   "minor stroke", denies residual on 09/27/2015   DVT (deep venous thrombosis) ([place]) 03/20/2013   RIGHT LEG AND LEFT LEG   DVT (deep venous thrombosis) ([place]) 02/2013   RLE   Esophageal varices ([place])    Essential hypertension, benign 03/20/2013   History of blood transfusion 1998   "blood clot began bleeding"   Hyperlipidemia    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   Leukopenia 08/11/2012   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   Mitral regurgitation    Pancytopenia, acquired ([place]) 09/17/2016   Due to non alcoholic cirhosis   Splenomegaly    Type II diabetes mellitus ([place]) dx'd in the 1990s    Past Surgical History:  Procedure Laterality Date   BIOPSY  10/14/2017   Procedure: BIOPSY;  Surgeon: Ladene Artist, MD;  [facility];  Service: Endoscopy;;   CATARACT EXTRACTION W/ INTRAOCULAR LENS IMPLANT Right 06/26/2012   CATARACT EXTRACTION W/ INTRAOCULAR LENS IMPLANT Left 07/27/2012   COLONOSCOPY     ESOPHAGOGASTRODUODENOSCOPY (EGD) WITH PROPOFOL N/A 09/03/2016    Procedure: ESOPHAGOGASTRODUODENOSCOPY (EGD) WITH PROPOFOL;  Surgeon: Ladene Artist, MD;  [facility];  Service: Endoscopy;  Laterality: N/A;   ESOPHAGOGASTRODUODENOSCOPY (EGD) WITH PROPOFOL N/A 10/14/2017   Procedure: ESOPHAGOGASTRODUODENOSCOPY (EGD) WITH PROPOFOL;  Surgeon: Ladene Artist, MD;  [facility];  Service: Endoscopy;  Laterality: N/A;    Social History   Socioeconomic History   Marital status: Married    Spouse name: Not on file   Number of children: 2   Years of education: 12   Highest education level: Not on file  Occupational History   Occupation: retired  Tobacco Use   Smoking status: Never   Smokeless tobacco: Never  Vaping Use   Vaping Use: Never used  Substance and Sexual Activity   Alcohol use: No    Alcohol/week: 0.0 standard drinks   Drug use: No   Sexual activity: Never  Other Topics Concern   Not on file  Social History Narrative   Patient drinks 1 cup of coffee daily.   Patient is left handed.   Social Determinants of Health   Financial Resource Strain: Not on file  Food Insecurity: Not on file  Transportation Needs: Not on file  Physical Activity: Not on file  Stress: Not on file  Social Connections: Not on file  Intimate Partner Violence: Not on file    Family History  Problem Relation Age  of Onset   Alzheimer's disease Father    Dementia Father    Cancer Sister        unknown type   Diabetes Sister    Deep vein thrombosis Sister        Varicose vein   Heart disease Mother        After age 31   Hypertension Mother    Pancreatic cancer Son     Current Outpatient Medications  Medication Sig Dispense Refill   aspirin EC 81 MG tablet Take 81 mg by mouth daily.     atorvastatin (LIPITOR) 40 MG tablet Take 40 mg by mouth every evening.      calcium carbonate (TUMS - DOSED IN MG ELEMENTAL CALCIUM) 500 MG chewable tablet Chew 1 tablet by mouth daily as needed for indigestion or heartburn.     ferrous sulfate  325 (65 FE) MG EC tablet Take 1 tablet (325 mg total) by mouth 2 (two) times daily with a meal. 60 tablet 3   glimepiride (AMARYL) 2 MG tablet Take 2 mg by mouth every morning.     linagliptin (TRADJENTA) 5 MG TABS tablet Take 5 mg by mouth daily.     losartan (COZAAR) 50 MG tablet Take 50 mg by mouth daily.      metFORMIN (GLUCOPHAGE) 1000 MG tablet Take 1,000 mg by mouth 2 (two) times daily with a meal.     ONETOUCH ULTRA test strip daily.     cycloSPORINE (RESTASIS) 0.05 % ophthalmic emulsion Place 1 drop into both eyes 2 (two) times daily as needed (for dry eyes). (Patient not taking: Reported on 08/07/2020)     Lutein 6 MG TABS Take 6 mg by mouth daily. (Patient not taking: Reported on 08/07/2020)     No current facility-administered medications for this visit.    Allergies  Allergen Reactions   Pravastatin Sodium Other (See Comments)     REVIEW OF SYSTEMS:   [X]  denotes positive finding, [ ]  denotes negative finding Cardiac  Comments:  Chest pain or chest pressure:    Shortness of breath upon exertion:    Short of breath when lying flat:    Irregular heart rhythm:        Vascular    Pain in calf, thigh, or hip brought on by ambulation:    Pain in feet at night that wakes you up from your sleep:     Blood clot in your veins:    Leg swelling:         Pulmonary    Oxygen at home:    Productive cough:     Wheezing:         Neurologic    Sudden weakness in arms or legs:     Sudden numbness in arms or legs:     Sudden onset of difficulty speaking or slurred speech:    Temporary loss of vision in one eye:     Problems with dizziness:         Gastrointestinal    Blood in stool:     Vomited blood:         Genitourinary    Burning when urinating:     Blood in urine:        Psychiatric    Major depression:         Hematologic    Bleeding problems:    Problems with blood clotting too easily:        Skin    Rashes or ulcers:  Constitutional    Fever or  chills:      PHYSICAL EXAMINATION:  Vitals:   08/07/20 1045 08/07/20 1047  BP: (!) 137/57 128/61  Pulse: 63 63  Resp: 18   Temp: (!) 97.5 F (36.4 C)   TempSrc: Temporal   SpO2: 100%   Weight: 160 lb (72.6 kg)   Height: 5' 10"  (1.778 m)     General:  WDWN in NAD; vital signs documented above Gait: Not observed HENT: WNL, normocephalic Pulmonary: normal non-labored breathing Cardiac: regular HR Abdomen: soft, NT, no masses Skin: without rashes Vascular Exam/Pulses:  Right Left  Radial 2+ (normal) 2+ (normal)  DP 1+ (weak) 2+ (normal)  PT absent absent   Extremities: without ischemic changes, without Gangrene , without cellulitis; without open wounds;  Musculoskeletal: no muscle wasting or atrophy  Neurologic: A&O X 3;  CN grossly intact Psychiatric:  The pt has Normal affect.   Non-Invasive Vascular Imaging:   R ICA 60-79% stenosis L ICA 40-59% stenosis    ASSESSMENT/PLAN:: 80 y.o. male here for follow up for surveillance of carotid artery stenosis  -Subjectively, patient has not had any neurological symptoms since last office visit -Carotid duplex findings are stable with 60 to 70% stenosis of right ICA and 40 to 59% stenosis of left ICA; asymptomatic -No indication for revascularization right ICA currently -Continue aspirin and statin daily -Recheck carotid duplex in 6 months per protocol   Dagoberto Ligas, PA-C [facility] [phone]  Clinic MD:   Trula Slade

## 2020-08-08 ENCOUNTER — Other Ambulatory Visit: Payer: Self-pay

## 2020-08-08 DIAGNOSIS — I6523 Occlusion and stenosis of bilateral carotid arteries: Secondary | ICD-10-CM

## 2020-09-11 DIAGNOSIS — I10 Essential (primary) hypertension: Secondary | ICD-10-CM | POA: Diagnosis not present

## 2021-01-18 DIAGNOSIS — E118 Type 2 diabetes mellitus with unspecified complications: Secondary | ICD-10-CM | POA: Diagnosis not present

## 2021-01-18 DIAGNOSIS — D693 Immune thrombocytopenic purpura: Secondary | ICD-10-CM | POA: Diagnosis not present

## 2021-01-18 DIAGNOSIS — E7800 Pure hypercholesterolemia, unspecified: Secondary | ICD-10-CM | POA: Diagnosis not present

## 2021-01-18 DIAGNOSIS — E039 Hypothyroidism, unspecified: Secondary | ICD-10-CM | POA: Diagnosis not present

## 2021-01-24 DIAGNOSIS — I6523 Occlusion and stenosis of bilateral carotid arteries: Secondary | ICD-10-CM | POA: Diagnosis not present

## 2021-01-24 DIAGNOSIS — E039 Hypothyroidism, unspecified: Secondary | ICD-10-CM | POA: Diagnosis not present

## 2021-01-24 DIAGNOSIS — I10 Essential (primary) hypertension: Secondary | ICD-10-CM | POA: Diagnosis not present

## 2021-01-24 DIAGNOSIS — D693 Immune thrombocytopenic purpura: Secondary | ICD-10-CM | POA: Diagnosis not present

## 2021-01-24 DIAGNOSIS — E119 Type 2 diabetes mellitus without complications: Secondary | ICD-10-CM | POA: Diagnosis not present

## 2021-01-24 DIAGNOSIS — K7460 Unspecified cirrhosis of liver: Secondary | ICD-10-CM | POA: Diagnosis not present

## 2021-01-24 DIAGNOSIS — E7800 Pure hypercholesterolemia, unspecified: Secondary | ICD-10-CM | POA: Diagnosis not present

## 2021-01-24 DIAGNOSIS — D649 Anemia, unspecified: Secondary | ICD-10-CM | POA: Diagnosis not present

## 2021-01-29 DIAGNOSIS — E039 Hypothyroidism, unspecified: Secondary | ICD-10-CM | POA: Diagnosis not present

## 2021-01-29 DIAGNOSIS — E119 Type 2 diabetes mellitus without complications: Secondary | ICD-10-CM | POA: Diagnosis not present

## 2021-01-29 DIAGNOSIS — E7800 Pure hypercholesterolemia, unspecified: Secondary | ICD-10-CM | POA: Diagnosis not present

## 2021-01-29 DIAGNOSIS — I10 Essential (primary) hypertension: Secondary | ICD-10-CM | POA: Diagnosis not present

## 2021-02-15 ENCOUNTER — Ambulatory Visit: Payer: Medicare Other | Admitting: Physician Assistant

## 2021-02-15 ENCOUNTER — Other Ambulatory Visit: Payer: Self-pay

## 2021-02-15 ENCOUNTER — Encounter: Payer: Self-pay | Admitting: Physician Assistant

## 2021-02-15 ENCOUNTER — Ambulatory Visit (HOSPITAL_COMMUNITY)
Admission: RE | Admit: 2021-02-15 | Discharge: 2021-02-15 | Disposition: A | Discharge status: Home / Self Care | Payer: Medicare Other | Source: Ambulatory Visit | Attending: Physician Assistant | Admitting: Physician Assistant

## 2021-02-15 VITALS — BP 147/60 | HR 65 | Temp 98.3°F | Resp 18 | Ht 70.0 in | Wt 168.0 lb

## 2021-02-15 DIAGNOSIS — I6523 Occlusion and stenosis of bilateral carotid arteries: Secondary | ICD-10-CM

## 2021-02-15 NOTE — Progress Notes (Signed)
HISTORY AND PHYSICAL     CC:  follow up. Requesting Provider:  Jani Gravel, MD  HPI: This is a 80 y.o. male here for follow up for carotid artery stenosis.  He has history of TIA in 2005.    Pt was last seen June 2022 and at that time He was doing well without neurological sx.  He was compliant with his statin and asa.    Pt returns today for follow up.    Pt denies any amaurosis fugax, speech difficulties, weakness, numbness, paralysis or clumsiness or facial droop.    His wife recently had back surgery and is recovering.   The pt is on a statin for cholesterol management.  The pt is on a daily aspirin.   Other AC:  none The pt is on ARB for hypertension.   The pt is diabetic.   Tobacco hx:  never  Pt does not have family hx of AAA.  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   Cirrhosis of liver ([facility])    Clotting disorder ([facility])    Colon polyps    CVA (cerebral vascular accident) ([place]) ~ 2000   "minor stroke", denies residual on 09/27/2015   DVT (deep venous thrombosis) ([place]) 03/20/2013   RIGHT LEG AND LEFT LEG   DVT (deep venous thrombosis) ([place]) 02/2013   RLE   Esophageal varices ([place])    Essential hypertension, benign 03/20/2013   History of blood transfusion 1998   "blood clot began bleeding"   Hyperlipidemia    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   Leukopenia 08/11/2012   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   Mitral regurgitation    Pancytopenia, acquired ([place]) 09/17/2016   Due to non alcoholic cirhosis   Splenomegaly    Type II diabetes mellitus ([place]) dx'd in the 1990s    Past Surgical History:  Procedure Laterality Date   BIOPSY  10/14/2017   Procedure: BIOPSY;  Surgeon: Ladene Artist, MD;  [facility];  Service: Endoscopy;;    CATARACT EXTRACTION W/ INTRAOCULAR LENS IMPLANT Right 06/26/2012   CATARACT EXTRACTION W/ INTRAOCULAR LENS IMPLANT Left 07/27/2012   COLONOSCOPY     ESOPHAGOGASTRODUODENOSCOPY (EGD) WITH PROPOFOL N/A 09/03/2016   Procedure: ESOPHAGOGASTRODUODENOSCOPY (EGD) WITH PROPOFOL;  Surgeon: Ladene Artist, MD;  [facility];  Service: Endoscopy;  Laterality: N/A;   ESOPHAGOGASTRODUODENOSCOPY (EGD) WITH PROPOFOL N/A 10/14/2017   Procedure: ESOPHAGOGASTRODUODENOSCOPY (EGD) WITH PROPOFOL;  Surgeon: Ladene Artist, MD;  [facility];  Service: Endoscopy;  Laterality: N/A;    Allergies  Allergen Reactions   Pravastatin Sodium Other (See Comments)    Current Outpatient Medications  Medication Sig Dispense Refill   aspirin EC 81 MG tablet Take 81 mg by mouth daily.     atorvastatin (LIPITOR) 40 MG tablet Take 40 mg by mouth every evening.      calcium carbonate (TUMS - DOSED IN MG ELEMENTAL CALCIUM) 500 MG chewable tablet Chew 1 tablet by mouth daily as needed for indigestion or heartburn.     cycloSPORINE (RESTASIS) 0.05 % ophthalmic emulsion Place 1 drop into both eyes 2 (two) times daily as needed (for dry eyes). (Patient not taking: Reported on 08/07/2020)     ferrous sulfate 325 (65 FE) MG EC tablet  Take 1 tablet (325 mg total) by mouth 2 (two) times daily with a meal. 60 tablet 3   glimepiride (AMARYL) 2 MG tablet Take 2 mg by mouth every morning.     linagliptin (TRADJENTA) 5 MG TABS tablet Take 5 mg by mouth daily.     losartan (COZAAR) 50 MG tablet Take 50 mg by mouth daily.      Lutein 6 MG TABS Take 6 mg by mouth daily. (Patient not taking: Reported on 08/07/2020)     metFORMIN (GLUCOPHAGE) 1000 MG tablet Take 1,000 mg by mouth 2 (two) times daily with a meal.     ONETOUCH ULTRA test strip daily.     No current facility-administered medications for this visit.    Family History  Problem Relation Age of Onset   Alzheimer's disease Father    Dementia Father    Cancer  Sister        unknown type   Diabetes Sister    Deep vein thrombosis Sister        Varicose vein   Heart disease Mother        After age 63   Hypertension Mother    Pancreatic cancer Son     Social History   Socioeconomic History   Marital status: Married    Spouse name: Not on file   Number of children: 2   Years of education: 12   Highest education level: Not on file  Occupational History   Occupation: retired  Tobacco Use   Smoking status: Never   Smokeless tobacco: Never  Vaping Use   Vaping Use: Never used  Substance and Sexual Activity   Alcohol use: No    Alcohol/week: 0.0 standard drinks   Drug use: No   Sexual activity: Never  Other Topics Concern   Not on file  Social History Narrative   Patient drinks 1 cup of coffee daily.   Patient is left handed.   Social Determinants of Health   Financial Resource Strain: Not on file  Food Insecurity: Not on file  Transportation Needs: Not on file  Physical Activity: Not on file  Stress: Not on file  Social Connections: Not on file  Intimate Partner Violence: Not on file     REVIEW OF SYSTEMS:   [X]  denotes positive finding, [ ]  denotes negative finding Cardiac  Comments:  Chest pain or chest pressure:    Shortness of breath upon exertion:    Short of breath when lying flat:    Irregular heart rhythm:        Vascular    Pain in calf, thigh, or hip brought on by ambulation:    Pain in feet at night that wakes you up from your sleep:     Blood clot in your veins:    Leg swelling:         Pulmonary    Oxygen at home:    Productive cough:     Wheezing:         Neurologic    Sudden weakness in arms or legs:     Sudden numbness in arms or legs:     Sudden onset of difficulty speaking or slurred speech:    Temporary loss of vision in one eye:     Problems with dizziness:         Gastrointestinal    Blood in stool:     Vomited blood:         Genitourinary    Burning when  urinating:     Blood in  urine:        Psychiatric    Major depression:         Hematologic    Bleeding problems:    Problems with blood clotting too easily:        Skin    Rashes or ulcers:        Constitutional    Fever or chills:      PHYSICAL EXAMINATION:  Today's Vitals   02/15/21 1359 02/15/21 1402  BP: (!) 146/55 (!) 147/60  Pulse: 65 65  Resp: 18   Temp: 98.3 F (36.8 C)   SpO2: 100%   Weight: 168 lb (76.2 kg)   Height: 5' 10"  (1.778 m)    Body mass index is 24.11 kg/m.   General:  WDWN in NAD; vital signs documented above Gait: Not observed HENT: WNL, normocephalic Pulmonary: normal non-labored breathing Cardiac: regular HR, with carotid bruits bilaterally Abdomen: soft, NT; aortic pulse is not palpable Skin: without rashes Vascular Exam/Pulses: Palpable radial pulses bilaterally Extremities: without ischemic changes, without Gangrene , without cellulitis; without open wounds Musculoskeletal: no muscle wasting or atrophy  Neurologic: A&O X 3; moving all extremities equally; speech is fluent/normal Psychiatric:  The pt has Normal affect.   Non-Invasive Vascular Imaging:   Carotid Duplex on 02/15/2021: Right:  60-79% ICA stenosis Left:  40-59% ICA stenosis Vertebrals:  Bilateral vertebral arteries demonstrate antegrade flow.  Subclavians: Normal flow hemodynamics were seen in bilateral subclavian arteries.  Previous Carotid duplex on 08/07/2020: Right: 60-79% ICA stenosis Left:   40-59% ICA stenosis    ASSESSMENT/PLAN:: 80 y.o. male here for follow up carotid artery stenosis   -duplex today reveals his bilateral ICA stenosis is essentially unchanged with 60-79% on the right and 40-59% on the left.  -discussed s/s of stroke with pt and he understands should he develop any of these sx, he will go to the nearest ER or call 911. -pt will f/u in 6 months with carotid duplex -pt will call sooner should they have any issues. -continue statin/asa   Leontine Locket,  [facility] [phone]  Clinic MD:  Trula Slade on call MD

## 2021-07-24 DIAGNOSIS — E7800 Pure hypercholesterolemia, unspecified: Secondary | ICD-10-CM | POA: Diagnosis not present

## 2021-07-24 DIAGNOSIS — D693 Immune thrombocytopenic purpura: Secondary | ICD-10-CM | POA: Diagnosis not present

## 2021-07-24 DIAGNOSIS — E1159 Type 2 diabetes mellitus with other circulatory complications: Secondary | ICD-10-CM | POA: Diagnosis not present

## 2021-07-24 DIAGNOSIS — Z0000 Encounter for general adult medical examination without abnormal findings: Secondary | ICD-10-CM | POA: Diagnosis not present

## 2021-07-24 DIAGNOSIS — D649 Anemia, unspecified: Secondary | ICD-10-CM | POA: Diagnosis not present

## 2021-07-24 DIAGNOSIS — I350 Nonrheumatic aortic (valve) stenosis: Secondary | ICD-10-CM | POA: Diagnosis not present

## 2021-07-24 DIAGNOSIS — E039 Hypothyroidism, unspecified: Secondary | ICD-10-CM | POA: Diagnosis not present

## 2021-07-24 DIAGNOSIS — K7460 Unspecified cirrhosis of liver: Secondary | ICD-10-CM | POA: Diagnosis not present

## 2021-07-24 DIAGNOSIS — I10 Essential (primary) hypertension: Secondary | ICD-10-CM | POA: Diagnosis not present

## 2021-07-30 DIAGNOSIS — E7800 Pure hypercholesterolemia, unspecified: Secondary | ICD-10-CM | POA: Diagnosis not present

## 2021-07-30 DIAGNOSIS — I10 Essential (primary) hypertension: Secondary | ICD-10-CM | POA: Diagnosis not present

## 2021-07-30 DIAGNOSIS — E039 Hypothyroidism, unspecified: Secondary | ICD-10-CM | POA: Diagnosis not present

## 2021-07-30 DIAGNOSIS — E119 Type 2 diabetes mellitus without complications: Secondary | ICD-10-CM | POA: Diagnosis not present

## 2021-07-30 DIAGNOSIS — R0989 Other specified symptoms and signs involving the circulatory and respiratory systems: Secondary | ICD-10-CM | POA: Diagnosis not present

## 2021-08-08 DIAGNOSIS — I350 Nonrheumatic aortic (valve) stenosis: Secondary | ICD-10-CM | POA: Diagnosis not present

## 2021-08-08 DIAGNOSIS — I6523 Occlusion and stenosis of bilateral carotid arteries: Secondary | ICD-10-CM | POA: Diagnosis not present

## 2021-08-08 DIAGNOSIS — R0989 Other specified symptoms and signs involving the circulatory and respiratory systems: Secondary | ICD-10-CM | POA: Diagnosis not present

## 2022-01-23 DIAGNOSIS — D693 Immune thrombocytopenic purpura: Secondary | ICD-10-CM | POA: Diagnosis not present

## 2022-01-23 DIAGNOSIS — D649 Anemia, unspecified: Secondary | ICD-10-CM | POA: Diagnosis not present

## 2022-01-23 DIAGNOSIS — E039 Hypothyroidism, unspecified: Secondary | ICD-10-CM | POA: Diagnosis not present

## 2022-01-23 DIAGNOSIS — E1159 Type 2 diabetes mellitus with other circulatory complications: Secondary | ICD-10-CM | POA: Diagnosis not present

## 2022-01-23 DIAGNOSIS — I10 Essential (primary) hypertension: Secondary | ICD-10-CM | POA: Diagnosis not present

## 2022-01-29 DIAGNOSIS — E1159 Type 2 diabetes mellitus with other circulatory complications: Secondary | ICD-10-CM | POA: Diagnosis not present

## 2022-01-29 DIAGNOSIS — I10 Essential (primary) hypertension: Secondary | ICD-10-CM | POA: Diagnosis not present

## 2022-01-29 DIAGNOSIS — E039 Hypothyroidism, unspecified: Secondary | ICD-10-CM | POA: Diagnosis not present

## 2022-01-29 DIAGNOSIS — E7800 Pure hypercholesterolemia, unspecified: Secondary | ICD-10-CM | POA: Diagnosis not present

## 2022-01-29 DIAGNOSIS — N1831 Chronic kidney disease, stage 3a: Secondary | ICD-10-CM | POA: Diagnosis not present

## 2022-01-30 DIAGNOSIS — I6523 Occlusion and stenosis of bilateral carotid arteries: Secondary | ICD-10-CM | POA: Diagnosis not present

## 2022-01-30 DIAGNOSIS — D693 Immune thrombocytopenic purpura: Secondary | ICD-10-CM | POA: Diagnosis not present

## 2022-01-30 DIAGNOSIS — E039 Hypothyroidism, unspecified: Secondary | ICD-10-CM | POA: Diagnosis not present

## 2022-01-30 DIAGNOSIS — E1159 Type 2 diabetes mellitus with other circulatory complications: Secondary | ICD-10-CM | POA: Diagnosis not present

## 2022-01-30 DIAGNOSIS — D649 Anemia, unspecified: Secondary | ICD-10-CM | POA: Diagnosis not present

## 2022-01-30 DIAGNOSIS — Z8673 Personal history of transient ischemic attack (TIA), and cerebral infarction without residual deficits: Secondary | ICD-10-CM | POA: Diagnosis not present

## 2022-01-30 DIAGNOSIS — I10 Essential (primary) hypertension: Secondary | ICD-10-CM | POA: Diagnosis not present

## 2022-01-30 DIAGNOSIS — N1831 Chronic kidney disease, stage 3a: Secondary | ICD-10-CM | POA: Diagnosis not present

## 2022-01-30 DIAGNOSIS — E7800 Pure hypercholesterolemia, unspecified: Secondary | ICD-10-CM | POA: Diagnosis not present

## 2022-02-25 DIAGNOSIS — H40013 Open angle with borderline findings, low risk, bilateral: Secondary | ICD-10-CM | POA: Diagnosis not present

## 2022-02-25 DIAGNOSIS — Z961 Presence of intraocular lens: Secondary | ICD-10-CM | POA: Diagnosis not present

## 2022-02-25 DIAGNOSIS — H04123 Dry eye syndrome of bilateral lacrimal glands: Secondary | ICD-10-CM | POA: Diagnosis not present

## 2022-02-25 DIAGNOSIS — H02831 Dermatochalasis of right upper eyelid: Secondary | ICD-10-CM | POA: Diagnosis not present

## 2022-02-25 DIAGNOSIS — H02834 Dermatochalasis of left upper eyelid: Secondary | ICD-10-CM | POA: Diagnosis not present

## 2022-02-25 DIAGNOSIS — H1045 Other chronic allergic conjunctivitis: Secondary | ICD-10-CM | POA: Diagnosis not present

## 2022-02-25 DIAGNOSIS — E119 Type 2 diabetes mellitus without complications: Secondary | ICD-10-CM | POA: Diagnosis not present

## 2022-02-25 DIAGNOSIS — H0102B Squamous blepharitis left eye, upper and lower eyelids: Secondary | ICD-10-CM | POA: Diagnosis not present

## 2022-02-25 DIAGNOSIS — H0102A Squamous blepharitis right eye, upper and lower eyelids: Secondary | ICD-10-CM | POA: Diagnosis not present

## 2022-03-30 ENCOUNTER — Other Ambulatory Visit: Payer: Self-pay

## 2022-03-30 DIAGNOSIS — I6523 Occlusion and stenosis of bilateral carotid arteries: Secondary | ICD-10-CM

## 2022-04-03 ENCOUNTER — Ambulatory Visit: Payer: Medicare HMO | Admitting: Physician Assistant

## 2022-04-03 ENCOUNTER — Ambulatory Visit (HOSPITAL_COMMUNITY)
Admission: RE | Admit: 2022-04-03 | Discharge: 2022-04-03 | Disposition: A | Discharge status: Home / Self Care | Payer: Medicare HMO | Source: Ambulatory Visit | Attending: Vascular Surgery | Admitting: Vascular Surgery

## 2022-04-03 VITALS — BP 157/71 | HR 67 | Temp 98.6°F | Ht 70.0 in | Wt 166.9 lb

## 2022-04-03 DIAGNOSIS — R600 Localized edema: Secondary | ICD-10-CM

## 2022-04-03 DIAGNOSIS — I6523 Occlusion and stenosis of bilateral carotid arteries: Secondary | ICD-10-CM | POA: Insufficient documentation

## 2022-04-03 NOTE — Progress Notes (Signed)
Office Note     CC:  follow up Requesting Provider:  Jani Gravel, MD  HPI: [name] is a 82 y.o. (1940/09/15) male who presents for surveillance of carotid artery stenosis.  He experienced a minor stroke around 2005 however has no ongoing deficits.  He is unsure of the cause of his CVA.  He denies any current strokelike symptoms including slurring speech, changes in vision, or one-sided weakness.  He is taking his aspirin and statin daily.  He denies tobacco use.  He also has history of DVT in his right lower extremity.  He has occasional edema of bilateral lower extremities especially after being on his feet during the day.  He is able to wear compression and manage symptoms of edema.  He denies any open wounds.   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   Cirrhosis of liver (HCC)    Clotting disorder (HCC)    Colon polyps    CVA (cerebral vascular accident) ([place]) ~ 2000   "minor stroke", denies residual on 09/27/2015   DVT (deep venous thrombosis) ([place]) 03/20/2013   RIGHT LEG AND LEFT LEG   DVT (deep venous thrombosis) ([place]) 02/2013   RLE   Esophageal varices ([place])    Essential hypertension, benign 03/20/2013   History of blood transfusion 1998   "blood clot began bleeding"   Hyperlipidemia    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   Leukopenia 08/11/2012   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[place]) 1996   Mitral regurgitation    Pancytopenia, acquired ([place]) 09/17/2016   Due to non alcoholic cirhosis   Splenomegaly    Type II diabetes mellitus ([place]) dx'd in the 1990s    Past Surgical History:  Procedure Laterality Date   BIOPSY  10/14/2017   Procedure: BIOPSY;  Surgeon: Ladene Artist, MD;  Location: [facility];  Service: Endoscopy;;   CATARACT EXTRACTION W/  INTRAOCULAR LENS IMPLANT Right 06/26/2012   CATARACT EXTRACTION W/ INTRAOCULAR LENS IMPLANT Left 07/27/2012   COLONOSCOPY     ESOPHAGOGASTRODUODENOSCOPY (EGD) WITH PROPOFOL N/A 09/03/2016   Procedure: ESOPHAGOGASTRODUODENOSCOPY (EGD) WITH PROPOFOL;  Surgeon: Ladene Artist, MD;  [facility]: [facility];  Service: Endoscopy;  Laterality: N/A;   ESOPHAGOGASTRODUODENOSCOPY (EGD) WITH PROPOFOL N/A 10/14/2017   Procedure: ESOPHAGOGASTRODUODENOSCOPY (EGD) WITH PROPOFOL;  Surgeon: Ladene Artist, MD;  [facility];  Service: Endoscopy;  Laterality: N/A;    Social History   Socioeconomic History   Marital status: Married    Spouse name: Not on file   Number of children: 2   Years of education: 12   Highest education level: Not on file  Occupational History   Occupation: retired  Tobacco Use   Smoking status: Never   Smokeless tobacco: Never  Vaping Use   Vaping Use: Never used  Substance and Sexual Activity   Alcohol use: No    Alcohol/week: 0.0 standard drinks of alcohol   Drug use: No   Sexual activity: Never  Other Topics Concern   Not on file  Social History Narrative   Patient drinks 1 cup of coffee daily.   Patient is left handed.   Social Determinants of Health   Financial Resource Strain: Not on file  Food Insecurity: Not on file  Transportation Needs: Not  on file  Physical Activity: Not on file  Stress: Not on file  Social Connections: Not on file  Intimate Partner Violence: Not on file    Family History  Problem Relation Age of Onset   Alzheimer's disease Father    Dementia Father    Cancer Sister        unknown type   Diabetes Sister    Deep vein thrombosis Sister        Varicose vein   Heart disease Mother        After age 29   Hypertension Mother    Pancreatic cancer Son     Current Outpatient Medications  Medication Sig Dispense Refill   aspirin EC 81 MG tablet Take 81 mg by mouth daily.     atorvastatin (LIPITOR) 40 MG tablet Take 40  mg by mouth every evening.      calcium carbonate (TUMS - DOSED IN MG ELEMENTAL CALCIUM) 500 MG chewable tablet Chew 1 tablet by mouth daily as needed for indigestion or heartburn.     ferrous sulfate 325 (65 FE) MG EC tablet Take 1 tablet (325 mg total) by mouth 2 (two) times daily with a meal. 60 tablet 3   glimepiride (AMARYL) 2 MG tablet Take 2 mg by mouth every morning.     levothyroxine (SYNTHROID) 50 MCG tablet Take 50 mcg by mouth daily before breakfast.     linagliptin (TRADJENTA) 5 MG TABS tablet Take 5 mg by mouth daily.     losartan (COZAAR) 50 MG tablet Take 50 mg by mouth daily.      Lutein 6 MG TABS Take 6 mg by mouth daily.     metFORMIN (GLUCOPHAGE) 1000 MG tablet Take 1,000 mg by mouth 2 (two) times daily with a meal.     ONETOUCH ULTRA test strip daily.     No current facility-administered medications for this visit.    Allergies  Allergen Reactions   Pravastatin Sodium Other (See Comments)     REVIEW OF SYSTEMS:   [X]$  denotes positive finding, [ ]$  denotes negative finding Cardiac  Comments:  Chest pain or chest pressure:    Shortness of breath upon exertion:    Short of breath when lying flat:    Irregular heart rhythm:        Vascular    Pain in calf, thigh, or hip brought on by ambulation:    Pain in feet at night that wakes you up from your sleep:     Blood clot in your veins:    Leg swelling:         Pulmonary    Oxygen at home:    Productive cough:     Wheezing:         Neurologic    Sudden weakness in arms or legs:     Sudden numbness in arms or legs:     Sudden onset of difficulty speaking or slurred speech:    Temporary loss of vision in one eye:     Problems with dizziness:         Gastrointestinal    Blood in stool:     Vomited blood:         Genitourinary    Burning when urinating:     Blood in urine:        Psychiatric    Major depression:         Hematologic    Bleeding problems:    Problems with blood clotting too  easily:         Skin    Rashes or ulcers:        Constitutional    Fever or chills:      PHYSICAL EXAMINATION:  Vitals:   04/03/22 0933 04/03/22 0934  BP: (!) 153/53 (!) 157/71  Pulse: 67   Temp: 98.6 F (37 C)   TempSrc: Temporal   SpO2: 100%   Weight: 166 lb 14.4 oz (75.7 kg)   Height: 5' 10"$  (1.778 m)     General:  WDWN in NAD; vital signs documented above Gait: Not observed HENT: WNL, normocephalic Pulmonary: normal non-labored breathing , without Rales, rhonchi,  wheezing Cardiac: regular HR Abdomen: soft, NT, no masses Skin: without rashes Vascular Exam/Pulses:  Right Left  Radial 2+ (normal) 2+ (normal)   Extremities: Hyperpigmentation of his right lower leg; minimal edema bilaterally.  No open wounds. Musculoskeletal: no muscle wasting or atrophy  Neurologic: A&O X 3; cranial nerves grossly intact Psychiatric:  The pt has normal affect.   Non-Invasive Vascular Imaging:   Right ICA 60 to 79% stenosis Left ICA 40 to 59% stenosis    ASSESSMENT/PLAN:: 82 y.o. male here for follow up for carotid artery surveillance  -Subjectively the patient has not experienced any strokelike symptoms since last office visit 6 months ago -Carotid duplex is stable demonstrating 60 to 79% stenosis of the right ICA and 40 to 59% stenosis of the left ICA.  No indication for revascularization of asymptomatic stenosis. -Continue aspirin and statin daily -Recheck carotid duplex in 6 months -Continue compression and elevation to manage symptoms of edema of bilateral lower extremities   Dagoberto Ligas, PA-C [facility] [phone]  Clinic MD:   Donzetta Matters

## 2022-04-05 ENCOUNTER — Other Ambulatory Visit: Payer: Self-pay

## 2022-04-05 DIAGNOSIS — I6523 Occlusion and stenosis of bilateral carotid arteries: Secondary | ICD-10-CM

## 2022-07-23 DIAGNOSIS — E039 Hypothyroidism, unspecified: Secondary | ICD-10-CM | POA: Diagnosis not present

## 2022-07-23 DIAGNOSIS — I10 Essential (primary) hypertension: Secondary | ICD-10-CM | POA: Diagnosis not present

## 2022-07-23 DIAGNOSIS — E119 Type 2 diabetes mellitus without complications: Secondary | ICD-10-CM | POA: Diagnosis not present

## 2022-07-23 DIAGNOSIS — Z125 Encounter for screening for malignant neoplasm of prostate: Secondary | ICD-10-CM | POA: Diagnosis not present

## 2022-07-23 DIAGNOSIS — E7800 Pure hypercholesterolemia, unspecified: Secondary | ICD-10-CM | POA: Diagnosis not present

## 2022-07-30 DIAGNOSIS — E1159 Type 2 diabetes mellitus with other circulatory complications: Secondary | ICD-10-CM | POA: Diagnosis not present

## 2022-07-30 DIAGNOSIS — D693 Immune thrombocytopenic purpura: Secondary | ICD-10-CM | POA: Diagnosis not present

## 2022-07-30 DIAGNOSIS — K7460 Unspecified cirrhosis of liver: Secondary | ICD-10-CM | POA: Diagnosis not present

## 2022-07-30 DIAGNOSIS — I10 Essential (primary) hypertension: Secondary | ICD-10-CM | POA: Diagnosis not present

## 2022-07-30 DIAGNOSIS — E039 Hypothyroidism, unspecified: Secondary | ICD-10-CM | POA: Diagnosis not present

## 2022-07-30 DIAGNOSIS — D61818 Other pancytopenia: Secondary | ICD-10-CM | POA: Diagnosis not present

## 2022-07-30 DIAGNOSIS — E7800 Pure hypercholesterolemia, unspecified: Secondary | ICD-10-CM | POA: Diagnosis not present

## 2022-07-30 DIAGNOSIS — Z0000 Encounter for general adult medical examination without abnormal findings: Secondary | ICD-10-CM | POA: Diagnosis not present

## 2022-07-30 DIAGNOSIS — N1831 Chronic kidney disease, stage 3a: Secondary | ICD-10-CM | POA: Diagnosis not present

## 2022-07-31 DIAGNOSIS — E1159 Type 2 diabetes mellitus with other circulatory complications: Secondary | ICD-10-CM | POA: Diagnosis not present

## 2022-07-31 DIAGNOSIS — E039 Hypothyroidism, unspecified: Secondary | ICD-10-CM | POA: Diagnosis not present

## 2022-07-31 DIAGNOSIS — E1165 Type 2 diabetes mellitus with hyperglycemia: Secondary | ICD-10-CM | POA: Diagnosis not present

## 2022-07-31 DIAGNOSIS — E1121 Type 2 diabetes mellitus with diabetic nephropathy: Secondary | ICD-10-CM | POA: Diagnosis not present

## 2022-07-31 DIAGNOSIS — E7800 Pure hypercholesterolemia, unspecified: Secondary | ICD-10-CM | POA: Diagnosis not present

## 2022-07-31 DIAGNOSIS — I10 Essential (primary) hypertension: Secondary | ICD-10-CM | POA: Diagnosis not present

## 2022-07-31 DIAGNOSIS — N1831 Chronic kidney disease, stage 3a: Secondary | ICD-10-CM | POA: Diagnosis not present

## 2022-09-19 ENCOUNTER — Ambulatory Visit: Payer: Medicare HMO | Admitting: Cardiovascular Disease

## 2022-09-19 NOTE — Progress Notes (Signed)
Cardiology Office Note:   Date:  09/20/2022  NAME:  [name]    MRN: [id number] DOB:  31-Jan-1941   PCP:  Merri Brunette, MD  Cardiologist:  None  Electrophysiologist:  None   Referring MD: Pearson Grippe, MD   Chief Complaint  Patient presents with   Shortness of Breath         History of Present Illness:   [name] is a 82 y.o. male with below history who presents for evaluation of SOB/murmur.  He has a longstanding history of cirrhosis.  Had esophageal varices and portal hypertensive gastropathy.  Has not been evaluated by GI in quite some time.  He is anemic with a hemoglobin of 9.6.  He has thrombocytopenia.  He also has bilateral carotid artery disease and is on aspirin.  No significant bleeding.  He reports he can get short of breath with heavy activity.  He actually does quite a bit of activity.  Also has some lower extremity edema in the right leg today.  No swelling or pain in the leg.  He reports it improves with compression of the leg.  He does have a murmur on examination 2 out of 6 systolic ejection murmur.  Suspect he has mild to moderate aortic stenosis.  He has notable carotid bruits bilaterally.  This is followed by vascular.  He has never had a heart attack.  He reports a mild stroke several years ago.  He also has CKD stage IIIa and diabetes.  His most recent A1c is 6.7.  Examination unremarkable other than bruits and lower extremity edema.  Faint murmur on exam.  Has not seen a liver specialist or hematologist.  We discussed this would be a good idea.  Reports no chest pain or pressure.  EKG is normal.  Problem List Carotid artery disease  -R ICA 60-79% -L ICA 40-59% 2. CVA 3. DVT 4. Cirrhosis 5. Esophageal varices/Portal hypertensive gastropathy   6. ITP 7. DM -A1c 6.7 8. HTN 9. CKD IIIa -eGFR 50 10. Pancytopenia -HGB 9.6, PLT 41 11. 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   Due to portal HTN   Cirrhosis of liver (HCC)    Clotting disorder (HCC)    Colon polyps    CVA (cerebral vascular accident) (HCC) ~ 2000   "minor stroke", denies residual on 09/27/2015   DVT (deep venous thrombosis) (HCC) 03/20/2013   RIGHT LEG AND LEFT LEG   DVT (deep venous thrombosis) (HCC) 02/2013   RLE   Esophageal varices (HCC)    Essential hypertension, benign 03/20/2013   History of blood transfusion 1998   "blood clot began bleeding"   Hyperlipidemia    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   Leukopenia 08/11/2012   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HCC) 1996   Mitral regurgitation    Pancytopenia, acquired (HCC) 09/17/2016   Due to non alcoholic cirhosis   Splenomegaly    Type II diabetes mellitus (HCC) dx'd in the 1990s    Past Surgical History: Past Surgical History:  Procedure Laterality Date   BIOPSY  10/14/2017   Procedure: BIOPSY;  Surgeon: Meryl Dare, MD;  [facility];  Service: Endoscopy;;   CATARACT EXTRACTION W/  INTRAOCULAR LENS IMPLANT Right 06/26/2012   CATARACT EXTRACTION W/ INTRAOCULAR LENS IMPLANT Left 07/27/2012   COLONOSCOPY     ESOPHAGOGASTRODUODENOSCOPY (EGD) WITH PROPOFOL N/A 09/03/2016   Procedure: ESOPHAGOGASTRODUODENOSCOPY (EGD) WITH PROPOFOL;  Surgeon: Meryl Dare, MD;  [facility];  Service: Endoscopy;  Laterality: N/A;   ESOPHAGOGASTRODUODENOSCOPY (EGD) WITH PROPOFOL N/A 10/14/2017   Procedure: ESOPHAGOGASTRODUODENOSCOPY (EGD) WITH PROPOFOL;  Surgeon: Meryl Dare, MD;  [facility];  Service: Endoscopy;  Laterality: N/A;    Current Medications: Current Meds  Medication Sig   aspirin EC 81 MG tablet Take 81 mg by mouth daily.   atorvastatin (LIPITOR) 40 MG tablet Take 40 mg by mouth every evening.    calcium  carbonate (TUMS - DOSED IN MG ELEMENTAL CALCIUM) 500 MG chewable tablet Chew 1 tablet by mouth daily as needed for indigestion or heartburn.   ferrous sulfate 325 (65 FE) MG EC tablet Take 1 tablet (325 mg total) by mouth 2 (two) times daily with a meal.   furosemide (LASIX) 20 MG tablet Take 1 tablet (20 mg total) by mouth daily as needed. AS NEEDED FOR SWELLING   glimepiride (AMARYL) 2 MG tablet Take 2 mg by mouth every morning.   linagliptin (TRADJENTA) 5 MG TABS tablet Take 5 mg by mouth daily.   losartan (COZAAR) 50 MG tablet Take 50 mg by mouth daily.    metFORMIN (GLUCOPHAGE) 1000 MG tablet Take 1,000 mg by mouth 2 (two) times daily with a meal.   ONETOUCH ULTRA test strip daily.   SYNTHROID 25 MCG tablet Take 25 mcg by mouth every morning.     Allergies:    Pravastatin sodium   Social History: Social History   Socioeconomic History   Marital status: Married    Spouse name: Not on file   Number of children: 1   Years of education: 12   Highest education level: Not on file  Occupational History   Occupation: retired   Occupation: Retail buyer  Tobacco Use   Smoking status: Never   Smokeless tobacco: Never  Vaping Use   Vaping status: Never Used  Substance and Sexual Activity   Alcohol use: No    Alcohol/week: 0.0 standard drinks of alcohol   Drug use: No   Sexual activity: Never  Other Topics Concern   Not on file  Social History Narrative   Patient drinks 1 cup of coffee daily.   Patient is left handed.   Social Determinants of Health   Financial Resource Strain: Not on file  Food Insecurity: Not on file  Transportation Needs: Not on file  Physical Activity: Not on file  Stress: Not on file  Social Connections: Not on file     Family History: The patient's family history includes Alzheimer's disease in his father; Cancer in his sister; Deep vein thrombosis in his sister; Dementia in his father; Diabetes in his sister; Heart disease in his mother;  Hypertension in his mother; Pancreatic cancer in his son.  ROS:   All other ROS reviewed and negative. Pertinent positives noted in the HPI.     EKGs/Labs/Other Studies Reviewed:   The following studies were personally reviewed by me today:  EKG:  EKG is ordered today.    EKG Interpretation Date/Time:  Friday September 20 2022 10:46:11 EDT Ventricular Rate:  66 PR Interval:  148 QRS Duration:  94 QT Interval:  422 QTC Calculation: 442 R Axis:   51  Text Interpretation: Normal sinus rhythm Normal ECG No previous  ECGs available Confirmed by Lennie Odor [phone]) on 09/20/2022 10:50:57 AM   Recent Labs: No results found for requested labs within last 365 days.   Recent Lipid Panel No results found for: "CHOL", "TRIG", "HDL", "CHOLHDL", "VLDL", "LDLCALC", "LDLDIRECT"  Physical Exam:   VS:  BP (!) 162/68 (BP Location: Right Arm, Patient Position: Sitting, Cuff Size: Normal)   Pulse 66   Ht 5\' 10"  (1.778 m)   Wt 161 lb 12.8 oz (73.4 kg)   SpO2 99%   BMI 23.22 kg/m    Wt Readings from Last 3 Encounters:  09/20/22 161 lb 12.8 oz (73.4 kg)  04/03/22 166 lb 14.4 oz (75.7 kg)  02/15/21 168 lb (76.2 kg)    General: Well nourished, well developed, in no acute distress Head: Atraumatic, normal size  Eyes: PEERLA, EOMI  Neck: Supple, no JVD, bilateral carotid bruits Endocrine: No thryomegaly Cardiac: Normal S1, S2; RRR; 2 out of 6 systolic ejection murmur Lungs: Clear to auscultation bilaterally, no wheezing, rhonchi or rales  Abd: Soft, nontender, no hepatomegaly  Ext: Trace edema in the left lower extremity Musculoskeletal: No deformities, BUE and BLE strength normal and equal Skin: Warm and dry, no rashes   Neuro: Alert and oriented to person, place, time, and situation, CNII-XII grossly intact, no focal deficits  Psych: Normal mood and affect   ASSESSMENT:   [name] is a 82 y.o. male who presents for the following: 1. Murmur   2. SOB (shortness of breath) on  exertion   3. Cirrhosis of liver without ascites, unspecified hepatic cirrhosis type (HCC)   4. Thrombocytopenia (HCC)   5. Mixed hyperlipidemia   6. Leg edema     PLAN:   1. Murmur -2/6 systolic ejection murmur.  Strongly suspect mild to moderate aortic stenosis.  Echo pending.  2. SOB (shortness of breath) on exertion -Suspect his shortness of breath is multifactorial.  He is anemic and has cirrhosis.  He is also not that active and elderly.  We will check an echocardiogram but I do not suspect his murmur is severe.  He will see me back in 6 months to reassess.  He does have some lower extremity edema which is likely related to venous insufficiency and cirrhosis.  He can take Lasix as needed.  3. Cirrhosis of liver without ascites, unspecified hepatic cirrhosis type (HCC) 4. Thrombocytopenia (HCC) -Referral to hepatology.  I would also like for him to see hematology.  I suspect his anemia and thrombocytopenia are related to his cirrhosis however we do need their confirmation.  He also likely needs screening EGD for esophageal varices.  He is on aspirin due to carotid artery disease but likely will need this followed closely by GI.  5. Mixed hyperlipidemia -Bilateral carotid artery disease.  On a statin.  With cirrhosis.  We will have GI weigh in on if he can be on a statin.  His lipids are well-controlled.  His carotid artery disease is followed by vascular.  Unclear if he would ever be a candidate for carotid revascularization.  6. Leg edema -Suspect this could be related to cirrhosis.  Lasix as needed ordered.  We will obtain an echocardiogram as well.      Disposition: Return in about 6 months (around 03/23/2023).  Medication Adjustments/Labs and Tests Ordered: Current medicines are reviewed at length with the patient today.  Concerns regarding medicines are outlined above.  Orders Placed This Encounter  Procedures   Ambulatory referral to Hematology / Oncology  Ambulatory  referral to Gastroenterology   Amb Referral to Hepatology   EKG 12-Lead   ECHOCARDIOGRAM COMPLETE   Meds ordered this encounter  Medications   furosemide (LASIX) 20 MG tablet    Sig: Take 1 tablet (20 mg total) by mouth daily as needed. AS NEEDED FOR SWELLING    Dispense:  30 tablet    Refill:  3   Patient Instructions  Medication Instructions:  FUROSEMIDE 20 MG AS NEEDED FOR SWELLING *If you need a refill on your cardiac medications before your next appointment, please call your pharmacy*   Lab Work: NONE If you have labs (blood work) drawn today and your tests are completely normal, you will receive your results only by: MyChart Message (if you have MyChart) OR A paper copy in the mail If you have any lab test that is abnormal or we need to change your treatment, we will call you to review the results.   Testing/Procedures: Your physician has requested that you have an echocardiogram. Echocardiography is a painless test that uses sound waves to create images of your heart. It provides your doctor with information about the size and shape of your heart and how well your heart's chambers and valves are working. This procedure takes approximately one hour. There are no restrictions for this procedure. Please do NOT wear cologne, perfume, aftershave, or lotions (deodorant is allowed). Please arrive 15 minutes prior to your appointment time.    Follow-Up: At [facility], you and your health needs are our priority.  As part of our continuing mission to provide you with exceptional heart care, we have created designated Provider Care Teams.  These Care Teams include your primary Cardiologist (physician) and Advanced Practice Providers (APPs -  Physician Assistants and Nurse Practitioners) who all work together to provide you with the care you need, when you need it.  We recommend signing up for the patient portal called "MyChart".  Sign up information is provided on this  After Visit Summary.  MyChart is used to connect with patients for Virtual Visits (Telemedicine).  Patients are able to view lab/test results, encounter notes, upcoming appointments, etc.  Non-urgent messages can be sent to your provider as well.   To learn more about what you can do with MyChart, go to [email or web address].    Your next appointment:   6 month(s)  Provider:   DR Flora Lipps  Other Instructions REFERRAL TO HEMATOLOGY AND [facility]      Signed, Lenna Gilford. Flora Lipps, MD, [facility]  [facility]  [address] [place], [place] [address] [phone]  09/20/2022 1:08 PM

## 2022-09-20 ENCOUNTER — Ambulatory Visit: Payer: Medicare HMO | Attending: Cardiovascular Disease | Admitting: Cardiovascular Disease

## 2022-09-20 ENCOUNTER — Encounter: Payer: Self-pay | Admitting: Cardiovascular Disease

## 2022-09-20 VITALS — BP 162/68 | HR 66 | Ht 70.0 in | Wt 161.8 lb

## 2022-09-20 DIAGNOSIS — E782 Mixed hyperlipidemia: Secondary | ICD-10-CM

## 2022-09-20 DIAGNOSIS — K7460 Unspecified cirrhosis of liver: Secondary | ICD-10-CM

## 2022-09-20 DIAGNOSIS — R0602 Shortness of breath: Secondary | ICD-10-CM

## 2022-09-20 DIAGNOSIS — D696 Thrombocytopenia, unspecified: Secondary | ICD-10-CM

## 2022-09-20 DIAGNOSIS — R011 Cardiac murmur, unspecified: Secondary | ICD-10-CM

## 2022-09-20 DIAGNOSIS — R600 Localized edema: Secondary | ICD-10-CM | POA: Diagnosis not present

## 2022-09-20 MED ORDER — FUROSEMIDE 20 MG PO TABS: 20.0000 mg | ORAL_TABLET | Freq: Every day | ORAL | 3 refills | Status: AC | PRN

## 2022-09-20 NOTE — Patient Instructions (Signed)
Medication Instructions:  FUROSEMIDE 20 MG AS NEEDED FOR SWELLING *If you need a refill on your cardiac medications before your next appointment, please call your pharmacy*   Lab Work: NONE If you have labs (blood work) drawn today and your tests are completely normal, you will receive your results only by: MyChart Message (if you have MyChart) OR A paper copy in the mail If you have any lab test that is abnormal or we need to change your treatment, we will call you to review the results.   Testing/Procedures: Your physician has requested that you have an echocardiogram. Echocardiography is a painless test that uses sound waves to create images of your heart. It provides your doctor with information about the size and shape of your heart and how well your heart's chambers and valves are working. This procedure takes approximately one hour. There are no restrictions for this procedure. Please do NOT wear cologne, perfume, aftershave, or lotions (deodorant is allowed). Please arrive 15 minutes prior to your appointment time.    Follow-Up: At [facility], you and your health needs are our priority.  As part of our continuing mission to provide you with exceptional heart care, we have created designated Provider Care Teams.  These Care Teams include your primary Cardiologist (physician) and Advanced Practice Providers (APPs -  Physician Assistants and Nurse Practitioners) who all work together to provide you with the care you need, when you need it.  We recommend signing up for the patient portal called "MyChart".  Sign up information is provided on this After Visit Summary.  MyChart is used to connect with patients for Virtual Visits (Telemedicine).  Patients are able to view lab/test results, encounter notes, upcoming appointments, etc.  Non-urgent messages can be sent to your provider as well.   To learn more about what you can do with MyChart, go to [email or web address].     Your next appointment:   6 month(s)  Provider:   DR Flora Lipps  Other Instructions REFERRAL TO HEMATOLOGY AND [facility]

## 2022-09-30 ENCOUNTER — Telehealth: Payer: Self-pay | Admitting: *Deleted

## 2022-09-30 NOTE — Telephone Encounter (Signed)
Received  fax -  patient has been schedule for [facility]  on 10 -21-24 at 2:15 pm .   Dr Flora Lipps  is aware.

## 2022-10-01 NOTE — Progress Notes (Unsigned)
Office Note     CC:  follow up Requesting Provider:  Pearson Grippe, MD  HPI: [name] is a 82 y.o. (09-Oct-1940) male who presents for routine follow up of carotid artery stenosis.He has remote history of a small stroke in 2005 with no residual deficits. He has been without any  TIA or stroke like symptoms. He denies any current symptoms including slurring speech, changes in vision, facial drooping, unilateral upper or lower extremity weakness or numbness. We have been closely monitoring his right ICA with 60-79% stenosis. He is medically managed on aspirin and statin daily.    He also has history of DVT in his right lower extremity.  He has occasional edema of bilateral lower extremities especially after being on his feet during the day.  He is able to wear compression and manage symptoms of edema.  He denies any open wounds.    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   Cirrhosis of liver (HCC)    Clotting disorder (HCC)    Colon polyps    CVA (cerebral vascular accident) (HCC) ~ 2000   "minor stroke", denies residual on 09/27/2015   DVT (deep venous thrombosis) (HCC) 03/20/2013   RIGHT LEG AND LEFT LEG   DVT (deep venous thrombosis) (HCC) 02/2013   RLE   Esophageal varices (HCC)    Essential hypertension, benign 03/20/2013   History of blood transfusion 1998   "blood clot began bleeding"   Hyperlipidemia    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   Leukopenia 08/11/2012   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HCC) 1996   Mitral regurgitation    Pancytopenia, acquired (HCC) 09/17/2016   Due to non alcoholic cirhosis   Splenomegaly    Type II diabetes mellitus (HCC) dx'd in the 1990s    Past Surgical History:  Procedure Laterality Date   BIOPSY  10/14/2017   Procedure:  BIOPSY;  Surgeon: Meryl Dare, MD;  Location: [facility];  Service: Endoscopy;;   CATARACT EXTRACTION W/ INTRAOCULAR LENS IMPLANT Right 06/26/2012   CATARACT EXTRACTION W/ INTRAOCULAR LENS IMPLANT Left 07/27/2012   COLONOSCOPY     ESOPHAGOGASTRODUODENOSCOPY (EGD) WITH PROPOFOL N/A 09/03/2016   Procedure: ESOPHAGOGASTRODUODENOSCOPY (EGD) WITH PROPOFOL;  Surgeon: Meryl Dare, MD;  Location: [facility];  Service: Endoscopy;  Laterality: N/A;   ESOPHAGOGASTRODUODENOSCOPY (EGD) WITH PROPOFOL N/A 10/14/2017   Procedure: ESOPHAGOGASTRODUODENOSCOPY (EGD) WITH PROPOFOL;  Surgeon: Meryl Dare, MD;  Location: [facility];  Service: Endoscopy;  Laterality: N/A;    Social History   Socioeconomic History   Marital status: Married    Spouse name: Not on file   Number of children: 1   Years of education: 12   Highest education level: Not on file  Occupational History   Occupation: retired   Occupation: Retail buyer  Tobacco Use   Smoking status: Never   Smokeless tobacco: Never  Vaping Use   Vaping status: Never Used  Substance and Sexual Activity   Alcohol use: No    Alcohol/week: 0.0 standard drinks of alcohol   Drug use: No   Sexual activity: Never  Other Topics Concern   Not on file  Social History Narrative   Patient drinks 1 cup of coffee daily.   Patient is left handed.  Social Determinants of Health   Financial Resource Strain: Not on file  Food Insecurity: Not on file  Transportation Needs: Not on file  Physical Activity: Not on file  Stress: Not on file  Social Connections: Not on file  Intimate Partner Violence: Not on file   *** Family History  Problem Relation Age of Onset   Alzheimer's disease Father    Dementia Father    Cancer Sister        unknown type   Diabetes Sister    Deep vein thrombosis Sister        Varicose vein   Heart disease Mother        After age 63   Hypertension Mother    Pancreatic cancer Son     Current  Outpatient Medications  Medication Sig Dispense Refill   aspirin EC 81 MG tablet Take 81 mg by mouth daily.     atorvastatin (LIPITOR) 40 MG tablet Take 40 mg by mouth every evening.      calcium carbonate (TUMS - DOSED IN MG ELEMENTAL CALCIUM) 500 MG chewable tablet Chew 1 tablet by mouth daily as needed for indigestion or heartburn.     ferrous sulfate 325 (65 FE) MG EC tablet Take 1 tablet (325 mg total) by mouth 2 (two) times daily with a meal. 60 tablet 3   furosemide (LASIX) 20 MG tablet Take 1 tablet (20 mg total) by mouth daily as needed. AS NEEDED FOR SWELLING 30 tablet 3   glimepiride (AMARYL) 2 MG tablet Take 2 mg by mouth every morning.     levothyroxine (SYNTHROID) 50 MCG tablet Take 50 mcg by mouth daily before breakfast. (Patient not taking: Reported on 09/20/2022)     linagliptin (TRADJENTA) 5 MG TABS tablet Take 5 mg by mouth daily.     losartan (COZAAR) 50 MG tablet Take 50 mg by mouth daily.      Lutein 6 MG TABS Take 6 mg by mouth daily. (Patient not taking: Reported on 09/20/2022)     metFORMIN (GLUCOPHAGE) 1000 MG tablet Take 1,000 mg by mouth 2 (two) times daily with a meal.     ONETOUCH ULTRA test strip daily.     SYNTHROID 25 MCG tablet Take 25 mcg by mouth every morning.     No current facility-administered medications for this visit.    Allergies  Allergen Reactions   Pravastatin Sodium Other (See Comments)     REVIEW OF SYSTEMS:  *** [X]  denotes positive finding, [ ]  denotes negative finding Cardiac  Comments:  Chest pain or chest pressure:    Shortness of breath upon exertion:    Short of breath when lying flat:    Irregular heart rhythm:        Vascular    Pain in calf, thigh, or hip brought on by ambulation:    Pain in feet at night that wakes you up from your sleep:     Blood clot in your veins:    Leg swelling:         Pulmonary    Oxygen at home:    Productive cough:     Wheezing:         Neurologic    Sudden weakness in arms or legs:      Sudden numbness in arms or legs:     Sudden onset of difficulty speaking or slurred speech:    Temporary loss of vision in one eye:     Problems with dizziness:  Gastrointestinal    Blood in stool:     Vomited blood:         Genitourinary    Burning when urinating:     Blood in urine:        Psychiatric    Major depression:         Hematologic    Bleeding problems:    Problems with blood clotting too easily:        Skin    Rashes or ulcers:        Constitutional    Fever or chills:      PHYSICAL EXAMINATION:  There were no vitals filed for this visit.  General:  WDWN in NAD; vital signs documented above Gait: Not observed HENT: WNL, normocephalic Pulmonary: normal non-labored breathing , without Rales, rhonchi,  wheezing Cardiac: {Desc; regular/irreg:14544} HR Abdomen: soft, NT, no masses Skin: {With/Without:20273} rashes Vascular Exam/Pulses: *** Extremities: {With/Without:20273} ischemic changes, {With/Without:20273} Gangrene , {With/Without:20273} cellulitis; {With/Without:20273} open wounds;  Musculoskeletal: no muscle wasting or atrophy  Neurologic: A&O X 3*** Psychiatric:  The pt has {Desc; normal/abnormal:11317::"Normal"} affect.   Non-Invasive Vascular Imaging:   ***    ASSESSMENT/PLAN:: 82 y.o. male here for follow up for ***   -***   Graceann Congress, PA-C [facility] [phone]  Clinic MD:   Randie Heinz

## 2022-10-02 ENCOUNTER — Ambulatory Visit: Payer: Medicare HMO | Admitting: Physician Assistant

## 2022-10-02 ENCOUNTER — Ambulatory Visit (HOSPITAL_COMMUNITY)
Admission: RE | Admit: 2022-10-02 | Discharge: 2022-10-02 | Disposition: A | Discharge status: Home / Self Care | Payer: Medicare HMO | Source: Ambulatory Visit | Attending: Vascular Surgery | Admitting: Vascular Surgery

## 2022-10-02 VITALS — BP 193/68 | HR 68 | Temp 98.3°F | Resp 18 | Ht 70.0 in | Wt 160.1 lb

## 2022-10-02 DIAGNOSIS — I6523 Occlusion and stenosis of bilateral carotid arteries: Secondary | ICD-10-CM | POA: Diagnosis not present

## 2022-10-04 ENCOUNTER — Other Ambulatory Visit: Payer: Self-pay

## 2022-10-04 DIAGNOSIS — I6523 Occlusion and stenosis of bilateral carotid arteries: Secondary | ICD-10-CM

## 2022-10-07 ENCOUNTER — Other Ambulatory Visit: Payer: Medicare HMO

## 2022-10-07 ENCOUNTER — Encounter: Payer: Medicare HMO | Admitting: Family

## 2022-10-10 ENCOUNTER — Ambulatory Visit (HOSPITAL_COMMUNITY): Payer: Medicare HMO | Attending: Cardiovascular Disease

## 2022-10-10 DIAGNOSIS — R011 Cardiac murmur, unspecified: Secondary | ICD-10-CM | POA: Diagnosis not present

## 2022-10-10 DIAGNOSIS — R0602 Shortness of breath: Secondary | ICD-10-CM | POA: Diagnosis not present

## 2022-10-10 LAB — ECHOCARDIOGRAM COMPLETE
AR max vel: 1.16 cm2
AV Area VTI: 1.18 cm2
AV Area mean vel: 1.05 cm2
AV Mean grad: 17 mmHg
AV Peak grad: 30.5 mmHg
Ao pk vel: 2.76 m/s
Area-P 1/2: 2.74 cm2
MV M vel: 4.81 m/s
MV Peak grad: 92.5 mmHg
Radius: 0.6 cm
S' Lateral: 3.3 cm

## 2022-10-11 ENCOUNTER — Other Ambulatory Visit: Payer: Self-pay | Admitting: Physician Assistant

## 2022-10-11 DIAGNOSIS — D649 Anemia, unspecified: Secondary | ICD-10-CM

## 2022-10-11 NOTE — Progress Notes (Unsigned)
[facility] Telephone:([phone]) [phone]   Fax:([phone]) [phone]  CONSULT NOTE  REFERRING PHYSICIAN: Dr. Flora Lipps   REASON FOR CONSULTATION:  Anemia   HPI [name] is a 82 y.o. 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  The patient was referred by his cardiologist.  He was establishing care with cardiology due to a murmur on 09/20/2022. The patient did not have a specialist for his cirrhosis and his cardiologist referred him to hepatology. He does not have appointment yet. He previously saw Dr. Russella Dar from GI for EGD. He was re-referred to GI for EGD screening for esophageal varices.  He is on aspirin due to carotid artery disease. He also had been anemic at that time with a hemoglobin at 9.6.  Regarding his murmur, he has some leg edema, they are arranging for an echocardiogram and due to his murmur which they suspect may be mild to moderate aortic stenosis. It was confirmed with mild aortic stenosis on echocardiogram.    Of note, the patient actually was seen here at the cancer center by Dr. Cyndie Chime in 2014.  Platelet count was in the 30,000's at that time.  He then established care with Dr. Pamelia Hoit in 2020 for thrombocytopenia which is felt to be related to his cirrhosis and splenomegaly.  In the absence of any bleeding, no treatment was recommended.  His last EGD was performed in 2019 by Dr. Russella Dar which showed grade 1 esophageal varices, portal hypertensive gastropathy, and erosive gastritis.  He has been on an iron supplement for several years.  He tolerates this well without any GI upset.  He has never required a iron infusion before.  He had a blood transfusion in 1996 when the patient had a "mesentery blockage".   The oldest labs available to me  are from 2014.  He has had anemia since that time.  I don't have any other labs after 2018 but it appears he has been anemic since 2014-2018 and possibly prior. The lowest Hbg I see is 8.9 in 2017.     Overall regarding symptoms, overall feels fair today.  He reports some easy bruising due to his aspirin use.  He denies any visible bleeding such as epistaxis, gingival bleeding, hemoptysis, hematemesis, or hematuria except he periodically may have dark stools that last a few days before improving.  He reports his energy is "pretty good" but gradually may go down in the second half of the day.  He occasionally gets lightheaded if he bends down and picks something up.  Denies any NSAID use.  He denies any history of bariatric surgery.  Denies any dietary restrictions such as being a vegan or vegetarian ; however, the patient does not eat a lot of red meat secondary to preference.  Denies any fever or chills.   The patient's family history consist of a mother with heart trouble and murmur.  Her father had Alzheimer's.  Her sister has diabetes.  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  He is married with 2 children.  Denies any alcohol or tobacco use.  HPI  Past Medical History:  Diagnosis Date   Anemia    Arthritis    "hands" (09/27/2015)   Carotid artery occlusion    Cellulitis and abscess of leg 03/19/2013  RIGHT LEG HEALED   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   Due to portal HTN   Cirrhosis of liver (HCC)    Clotting disorder (HCC)    Colon polyps    CVA (cerebral vascular accident) (HCC) ~ 2000   "minor stroke", denies residual on 09/27/2015   DVT (deep venous thrombosis) (HCC) 03/20/2013   RIGHT LEG AND LEFT LEG   DVT (deep venous thrombosis) (HCC) 02/2013   RLE   Esophageal varices (HCC)    Essential hypertension, benign 03/20/2013   History of blood transfusion 1998   "blood clot began bleeding"   Hyperlipidemia    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   Leukopenia 08/11/2012   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HCC) 1996   Mitral regurgitation    Pancytopenia, acquired (HCC) 09/17/2016   Due to non alcoholic cirhosis   Splenomegaly    Type II diabetes mellitus (HCC) dx'd in the 1990s    Past Surgical History:  Procedure Laterality Date   BIOPSY  10/14/2017   Procedure: BIOPSY;  Surgeon: Meryl Dare, MD;  Location: [facility];  Service: Endoscopy;;   CATARACT EXTRACTION W/ INTRAOCULAR LENS IMPLANT Right 06/26/2012   CATARACT EXTRACTION W/ INTRAOCULAR LENS IMPLANT Left 07/27/2012   COLONOSCOPY     ESOPHAGOGASTRODUODENOSCOPY (EGD) WITH PROPOFOL N/A 09/03/2016   Procedure: ESOPHAGOGASTRODUODENOSCOPY (EGD) WITH PROPOFOL;  Surgeon: Meryl Dare, MD;  Location: [facility];  Service: Endoscopy;  Laterality: N/A;   ESOPHAGOGASTRODUODENOSCOPY (EGD) WITH PROPOFOL N/A 10/14/2017   Procedure: ESOPHAGOGASTRODUODENOSCOPY (EGD) WITH PROPOFOL;  Surgeon: Meryl Dare, MD;  Location: [facility];  Service: Endoscopy;  Laterality: N/A;    Family History  Problem Relation Age of Onset   Alzheimer's disease Father    Dementia Father    Cancer Sister        unknown type   Diabetes Sister    Deep vein thrombosis Sister        Varicose vein   Heart disease Mother        After age 22   Hypertension Mother    Pancreatic cancer Son     Social History Social History   Tobacco Use   Smoking status: Never   Smokeless tobacco: Never  Vaping Use   Vaping status: Never Used  Substance Use Topics   Alcohol use: No    Alcohol/week: 0.0 standard drinks of alcohol   Drug use: No    Allergies  Allergen Reactions   Pravastatin Sodium Other (See Comments)    Current Outpatient Medications  Medication Sig Dispense Refill   aspirin EC 81 MG tablet Take 81 mg by mouth daily.     atorvastatin (LIPITOR) 40 MG tablet Take 40 mg by mouth every evening.      calcium carbonate (TUMS - DOSED IN MG ELEMENTAL  CALCIUM) 500 MG chewable tablet Chew 1 tablet by mouth daily as needed for indigestion or heartburn.     ferrous sulfate 325 (65 FE) MG EC tablet Take 1 tablet (325 mg total) by mouth 2 (two) times daily with a meal. 60 tablet 3   furosemide (LASIX) 20 MG tablet Take 1 tablet (20 mg total) by mouth daily as needed. AS NEEDED FOR SWELLING 30 tablet 3   glimepiride (AMARYL) 2 MG tablet Take 2 mg by mouth every morning.     levothyroxine (SYNTHROID) 50 MCG tablet Take 50 mcg by mouth daily before breakfast.     linagliptin (TRADJENTA) 5 MG TABS tablet Take 5 mg by mouth daily.  losartan (COZAAR) 50 MG tablet Take 50 mg by mouth daily.      Lutein 6 MG TABS Take 6 mg by mouth daily.     metFORMIN (GLUCOPHAGE) 1000 MG tablet Take 1,000 mg by mouth 2 (two) times daily with a meal.     ONETOUCH ULTRA test strip daily.     SYNTHROID 25 MCG tablet Take 25 mcg by mouth every morning.     No current facility-administered medications for this visit.    REVIEW OF SYSTEMS:   Review of Systems  Constitutional: Positive for stable fatigue. Positive for cold intolerance. Negative for appetite change, chills,  fever and unexpected weight change.  HENT: Negative for mouth sores, nosebleeds, sore throat and trouble swallowing.   Eyes: Negative for eye problems and icterus.  Respiratory: Positive for stable dyspnea on exertion. Negative for cough, hemoptysis, and wheezing.   Cardiovascular: Negative for chest pain and leg swelling.  Gastrointestinal: Negative for abdominal pain, constipation, diarrhea, nausea and vomiting.  Genitourinary: Negative for bladder incontinence, difficulty urinating, dysuria, frequency and hematuria.   Musculoskeletal: Negative for back pain, gait problem, neck pain and neck stiffness.  Skin: Negative for itching and rash.  Neurological: Negative for dizziness, extremity weakness, gait problem, headaches, light-headedness and seizures.  Hematological: Negative for adenopathy.  Does not bleed easily. Positive for easy bruising.  Psychiatric/Behavioral: Negative for confusion, depression and sleep disturbance. The patient is not nervous/anxious.     PHYSICAL EXAMINATION:  Blood pressure (!) 163/53, pulse (!) 58, temperature 97.7 F (36.5 C), temperature source Oral, resp. rate 15, height 5\' 10"  (1.778 m), weight 159 lb 8 oz (72.3 kg), SpO2 100%.  ECOG PERFORMANCE STATUS: 1  Physical Exam  Constitutional: Oriented to person, place, and time and well-developed, well-nourished, and in no distress.  HENT:  Head: Normocephalic and atraumatic.  Mouth/Throat: Oropharynx is clear and moist. No oropharyngeal exudate.  Eyes: Conjunctivae are normal. Right eye exhibits no discharge. Left eye exhibits no discharge. No scleral icterus.  Neck: Normal range of motion. Neck supple.  Cardiovascular: Normal rate, regular rhythm, murmur noted and intact distal pulses.   Pulmonary/Chest: Effort normal and breath sounds normal. No respiratory distress. No wheezes. No rales.  Abdominal: Soft. Bowel sounds are normal. Exhibits no distension and no mass. There is no tenderness.  Musculoskeletal: Normal range of motion. Exhibits no edema.  Lymphadenopathy:    No cervical adenopathy.  Neurological: Alert and oriented to person, place, and time. Exhibits muscle wasting. Gait normal. Coordination normal.  Skin: Skin is warm and dry. No rash noted. Not diaphoretic. No erythema. No pallor.  Psychiatric: Mood, memory and judgment normal.  Vitals reviewed.  LABORATORY DATA: Lab Results  Component Value Date   WBC 4.5 10/14/2022   HGB 9.4 (L) 10/14/2022   HCT 28.9 (L) 10/14/2022   MCV 102.5 (H) 10/14/2022   PLT 35 (L) 10/14/2022      Chemistry      Component Value Date/Time   NA 143 10/14/2022 1246   NA 144 09/17/2016 0918   NA 140 09/01/2012 0933   K 4.4 10/14/2022 1246   K 4.6 09/01/2012 0933   CL 112 (H) 10/14/2022 1246   CO2 24 10/14/2022 1246   CO2 24 09/01/2012 0933    BUN 28 (H) 10/14/2022 1246   BUN 18 09/17/2016 0918   BUN 16.2 09/01/2012 0933   CREATININE 1.50 (H) 10/14/2022 1246   CREATININE 1.1 09/01/2012 0933      Component Value Date/Time   CALCIUM 9.6  10/14/2022 1246   CALCIUM 9.2 09/01/2012 0933   ALKPHOS 51 10/14/2022 1246   ALKPHOS 55 09/01/2012 0933   AST 30 10/14/2022 1246   AST 27 09/01/2012 0933   ALT 29 10/14/2022 1246   ALT 28 09/01/2012 0933   BILITOT 0.8 10/14/2022 1246   BILITOT 0.91 09/01/2012 0933       RADIOGRAPHIC STUDIES: ECHOCARDIOGRAM COMPLETE  Result Date: 10/10/2022    ECHOCARDIOGRAM REPORT   Patient Name:   JAWONE WIERZBOWSKI Date of Exam: 10/10/2022 Medical Rec #:  [id number]        Height:       70.0 in Accession #:    [id number]       Weight:       160.1 lb Date of Birth:  [birth year]         BSA:          1.899 m Patient Age:    82 years         BP:           169/70 mmHg Patient Gender: M                HR:           67 bpm. Exam Location:  [address] Procedure: 2D Echo, 3D Echo, Cardiac Doppler, Color Doppler and Strain Analysis Indications:    R01.1 Murmur  History:        Patient has prior history of Echocardiogram examinations, most                 recent 11/19/2006. DVT, Signs/Symptoms:Edema; Risk                 Factors:Diabetes, Hypertension and HLD.  Sonographer:    Clearence Ped RCS Referring Phys: [id number] Ronnald Ramp O'NEAL IMPRESSIONS  1. Left ventricular ejection fraction, by estimation, is 60 to 65%. Left ventricular ejection fraction by 3D volume is 60 %. The left ventricle has normal function. The left ventricle has no regional wall motion abnormalities. Left ventricular diastolic  function could not be evaluated. The average left ventricular global longitudinal strain is -20.2 %. The global longitudinal strain is normal.  2. Right ventricular systolic function is normal. The right ventricular size is normal. There is normal pulmonary artery systolic pressure.  3. The mitral valve is normal in structure. Mild  to moderate mitral valve regurgitation. No evidence of mitral stenosis. Moderate mitral annular calcification.  4. The aortic valve is calcified. There is moderate calcification of the aortic valve. There is moderate thickening of the aortic valve. Aortic valve regurgitation is not visualized. Mild to moderate aortic valve stenosis. Aortic valve area, by VTI measures 1.18 cm. Aortic valve mean gradient measures 17.0 mmHg. Aortic valve Vmax measures 2.76 m/s.  5. The inferior vena cava is normal in size with greater than 50% respiratory variability, suggesting right atrial pressure of 3 mmHg. FINDINGS  Left Ventricle: Left ventricular ejection fraction, by estimation, is 60 to 65%. Left ventricular ejection fraction by 3D volume is 60 %. The left ventricle has normal function. The left ventricle has no regional wall motion abnormalities. The average left ventricular global longitudinal strain is -20.2 %. The global longitudinal strain is normal. The left ventricular internal cavity size was normal in size. There is no left ventricular hypertrophy. Left ventricular diastolic function could not be evaluated due to mitral annular calcification (moderate or greater). Left ventricular diastolic function could not be evaluated. Right Ventricle: The right ventricular size is normal.  No increase in right ventricular wall thickness. Right ventricular systolic function is normal. There is normal pulmonary artery systolic pressure. The tricuspid regurgitant velocity is 2.33 m/s, and  with an assumed right atrial pressure of 3 mmHg, the estimated right ventricular systolic pressure is 24.7 mmHg. Left Atrium: Left atrial size was normal in size. Right Atrium: Right atrial size was normal in size. Pericardium: There is no evidence of pericardial effusion. Mitral Valve: The mitral valve is normal in structure. Moderate mitral annular calcification. Mild to moderate mitral valve regurgitation. No evidence of mitral valve stenosis.  Tricuspid Valve: The tricuspid valve is normal in structure. Tricuspid valve regurgitation is trivial. No evidence of tricuspid stenosis. Aortic Valve: The aortic valve is calcified. There is moderate calcification of the aortic valve. There is moderate thickening of the aortic valve. There is moderate aortic valve annular calcification. Aortic valve regurgitation is not visualized. Mild to moderate aortic stenosis is present. Aortic valve mean gradient measures 17.0 mmHg. Aortic valve peak gradient measures 30.5 mmHg. Aortic valve area, by VTI measures 1.18 cm. Pulmonic Valve: The pulmonic valve was not well visualized. Pulmonic valve regurgitation is trivial. No evidence of pulmonic stenosis. Aorta: The aortic root is normal in size and structure. Venous: The inferior vena cava is normal in size with greater than 50% respiratory variability, suggesting right atrial pressure of 3 mmHg. IAS/Shunts: No atrial level shunt detected by color flow Doppler.  LEFT VENTRICLE PLAX 2D LVIDd:         4.60 cm         Diastology LVIDs:         3.30 cm         LV e' medial:    12.30 cm/s LV PW:         1.00 cm         LV E/e' medial:  8.5 LV IVS:        0.90 cm         LV e' lateral:   9.46 cm/s LVOT diam:     1.90 cm         LV E/e' lateral: 11.0 LV SV:         84 LV SV Index:   44              2D LVOT Area:     2.84 cm        Longitudinal                                Strain                                2D Strain GLS  -21.1 %                                (A2C):                                2D Strain GLS  -22.3 %                                (A3C):  2D Strain GLS  -17.3 %                                (A4C):                                2D Strain GLS  -20.2 %                                Avg:                                 3D Volume EF                                LV 3D EF:    Left                                             ventricul                                             ar                                              ejection                                             fraction                                             by 3D                                             volume is                                             60 %.                                 3D Volume EF:                                3D EF:        60 %  LV EDV:       103 ml                                LV ESV:       41 ml                                LV SV:        62 ml RIGHT VENTRICLE RV Basal diam:  3.20 cm RV S prime:     13.30 cm/s TAPSE (M-mode): 2.6 cm RVSP:           24.7 mmHg LEFT ATRIUM             Index        RIGHT ATRIUM           Index LA diam:        4.30 cm 2.26 cm/m   RA Pressure: 3.00 mmHg LA Vol (A2C):   60.0 ml 31.60 ml/m  RA Area:     10.80 cm LA Vol (A4C):   56.1 ml 29.54 ml/m  RA Volume:   21.30 ml  11.22 ml/m LA Biplane Vol: 58.8 ml 30.96 ml/m  AORTIC VALVE AV Area (Vmax):    1.16 cm AV Area (Vmean):   1.05 cm AV Area (VTI):     1.18 cm AV Vmax:           276.00 cm/s AV Vmean:          195.000 cm/s AV VTI:            0.717 m AV Peak Grad:      30.5 mmHg AV Mean Grad:      17.0 mmHg LVOT Vmax:         113.00 cm/s LVOT Vmean:        71.900 cm/s LVOT VTI:          0.298 m LVOT/AV VTI ratio: 0.42  AORTA Ao Root diam: 2.90 cm Ao Asc diam:  3.40 cm MITRAL VALVE                  TRICUSPID VALVE MV Area (PHT):                TR Peak grad:   21.7 mmHg MV Decel Time:                TR Vmax:        233.00 cm/s MR Peak grad:    92.5 mmHg    Estimated RAP:  3.00 mmHg MR Mean grad:    60.0 mmHg    RVSP:           24.7 mmHg MR Vmax:         481.00 cm/s MR Vmean:        368.0 cm/s   SHUNTS MR PISA:         2.26 cm     Systemic VTI:  0.30 m MR PISA Eff ROA: 14 mm       Systemic Diam: 1.90 cm MR PISA Radius:  0.60 cm MV E velocity: 104.00 cm/s MV A velocity: 92.80 cm/s MV E/A ratio:  1.12 Kardie Tobb DO Electronically signed by Thomasene Ripple DO Signature Date/Time:  10/10/2022/10:49:01 AM    Final    VAS US CAROTID  Result Date: 10/02/2022 Carotid Arterial Duplex Study Patient Name:  Kerrigan R Swann  Date of Exam:   10/02/2022 Medical Rec #: [id number]         Accession #:    [id number] Date of Birth: [birth year]          Patient Gender: M Patient Age:   63 years Exam Location:  [facility] Procedure:      VAS US CAROTID Referring Phys: Cristal Deer DICKSON --------------------------------------------------------------------------------  Indications:       Carotid artery disease. Risk Factors:      Hyperlipidemia. Comparison Study:  04/03/22 Performing Technologist: [place] Ponder RVS  Examination Guidelines: A complete evaluation includes B-mode imaging, spectral Doppler, color Doppler, and power Doppler as needed of all accessible portions of each vessel. Bilateral testing is considered an integral part of a complete examination. Limited examinations for reoccurring indications may be performed as noted.  Right Carotid Findings: +----------+--------+--------+--------+------------------+--------+           PSV cm/sEDV cm/sStenosisPlaque DescriptionComments +----------+--------+--------+--------+------------------+--------+ CCA Prox  82      10              heterogenous               +----------+--------+--------+--------+------------------+--------+ CCA Distal83      14              heterogenous               +----------+--------+--------+--------+------------------+--------+ ICA Prox  410     83      60-79%  calcific                   +----------+--------+--------+--------+------------------+--------+ ICA Mid   375     57                                         +----------+--------+--------+--------+------------------+--------+ ICA Distal143     31                                         +----------+--------+--------+--------+------------------+--------+ ECA       166                                                 +----------+--------+--------+--------+------------------+--------+ +----------+--------+-------+--------+-------------------+           PSV cm/sEDV cmsDescribeArm Pressure (mmHG) +----------+--------+-------+--------+-------------------+ Subclavian172                                        +----------+--------+-------+--------+-------------------+ +---------+--------+--+--------+--+---------+ VertebralPSV cm/s72EDV cm/s13Antegrade +---------+--------+--+--------+--+---------+  Left Carotid Findings: +----------+--------+--------+--------+------------------+--------+           PSV cm/sEDV cm/sStenosisPlaque DescriptionComments +----------+--------+--------+--------+------------------+--------+ CCA Prox  121     15              heterogenous               +----------+--------+--------+--------+------------------+--------+ CCA Distal103     19              heterogenous               +----------+--------+--------+--------+------------------+--------+ ICA Prox  135     20  heterogenous               +----------+--------+--------+--------+------------------+--------+ ICA Mid   160     41      40-59%                             +----------+--------+--------+--------+------------------+--------+ ICA Distal180     51                                         +----------+--------+--------+--------+------------------+--------+ ECA       158     13                                         +----------+--------+--------+--------+------------------+--------+ +----------+--------+--------+--------+-------------------+           PSV cm/sEDV cm/sDescribeArm Pressure (mmHG) +----------+--------+--------+--------+-------------------+ [id number]                                         +----------+--------+--------+--------+-------------------+ +---------+--------+--+--------+-+---------+ VertebralPSV cm/s62EDV cm/s8Antegrade  +---------+--------+--+--------+-+---------+   Summary: Right Carotid: Velocities in the right ICA are consistent with a 60-79%                stenosis. Left Carotid: Velocities in the left ICA are consistent with a 40-59% stenosis. Vertebrals: Bilateral vertebral arteries demonstrate antegrade flow. *See table(s) above for measurements and observations.  Electronically signed by Lemar Livings MD on 10/02/2022 at 10:34:09 AM.    Final     ASSESSMENT: This is a very pleasant 82 year old Caucasian male with thrombocytopenia secondary to cirrhosis and splenomegaly and anemia.   The patient was previously seen by Dr. Cyndie Chime and Dr. Pamelia Hoit in the past in 2014 and 2020 respectively.  His prior workup including JAK2 mutation testing which was normal and elevated SPEP but negative M protein.   The patient had a repeat CBC, CMP, iron studies, ferritin, B12, folic acid, and  sample blood bank.   The patient was seen with Dr. Arbutus Ped today.  Labs were reviewed.  The patient's labs show stable anemia with a hemoglobin of 9.4.  His MCV is elevated at 102.5.  His platelet count continues to be stable but low at 35K.  He has some level of CKD with a creatinine around 1.5.  Dr. Arbutus Ped agrees that his stable anemia and thrombocytopenia is likely secondary to his cirrhosis and splenomegaly.  No treatment is recommended unless bleeding or plan for upcoming surgery in which case Dr. Arbutus Ped may consider thrombopoietin receptor agonist.  In the meantime the patient will continue his iron supplement.  If his iron studies show significant iron deficiency then we may recommend iron infusion.  Regarding supportive care, we would not recommend a transfusion unless his hemoglobin were less than 8 or platelet count 10 or 20,000 or less and/or bleeding.  We agree that the patient should follow with GI and hepatology for his cirrhosis.  We will defer any fecal occult stool testing to GI  Recommend that he continue taking  iron supplement with vitamin C to help with iron absorption.  We will see the patient back for follow-up visit with lab work in 6 months to keep an eye on his thrombocytopenia and  anemia.  The patient was advised to call immediately if he has any concerning symptoms in the interval. The patient voices understanding of current disease status and treatment options and is in agreement with the current care plan. All questions were answered. The patient knows to call the clinic with any problems, questions or concerns. We can certainly see the patient much sooner if necessary  The patient voices understanding of current disease status and treatment options and is in agreement with the current care plan.  All questions were answered. The patient knows to call the clinic with any problems, questions or concerns. We can certainly see the patient much sooner if necessary.  Thank you so much for allowing me to participate in the care of [name]. I will continue to follow up the patient with you and assist in his care.   Disclaimer: This note was dictated with voice recognition software. Similar sounding words can inadvertently be transcribed and may not be corrected upon review.   Zakeria Kulzer L Ariza Evans October 14, 2022, 2:44 PM  ADDENDUM: Hematology/Oncology Attending: I had a face-to-face encounter with the patient today.  I reviewed his record, lab and recommended his care plan.  This is a very pleasant 82 years old white male with multiple medical problem including history of liver cirrhosis with associated esophageal varices, portal hyper tension as well as gastropathy and splenomegaly.  He also has a history of hypertension, carotid stenosis, diabetes mellitus, stroke, congestive heart failure and chronic kidney disease.  The patient was seen by hematology in the past including Dr. Cyndie Chime in 2017 as well as Dr. 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  He was seen recently by his cardiologist and he was noted during his blood work to have persistent anemia as well as thrombocytopenia.  He was seen by Dr. Russella Dar in the past for gastrointestinal blood work and close monitoring of esophageal varices. The patient was referred to [place] today for evaluation and recommendation regarding his persistent anemia and thrombocytopenia. I had a lengthy discussion with the patient and his wife today about his condition.  I explained to the patient that there is no active treatment for thrombocytopenia of liver cirrhosis except if the patient is undergoing a surgical procedure with platelets count less than 50,000 then we may consider him for one of the FDA agent like Avatrombopag or Lusutrombopag to increase his platelets count over 50,000 before surgery. For the anemia, we advised him to follow-up with his gastroenterologist Dr. Russella Dar for evaluation of any gastrointestinal blood loss.  He was also advised to take over-the-counter oral iron supplement with vitamin C. We will see the patient back for follow-up visit in 6 months for evaluation and repeat blood work. He was advised to call immediately if he has any concerning symptoms in the interval. The total time spent in the appointment was 60 minutes. Disclaimer: This note was dictated with voice recognition software. Similar sounding words can inadvertently be transcribed and may be missed upon review. Lajuana Matte, MD

## 2022-10-14 ENCOUNTER — Inpatient Hospital Stay: Payer: Medicare HMO

## 2022-10-14 ENCOUNTER — Inpatient Hospital Stay: Payer: Medicare HMO | Attending: Physician Assistant | Admitting: Physician Assistant

## 2022-10-14 VITALS — BP 163/53 | HR 58 | Temp 97.7°F | Resp 15 | Ht 70.0 in | Wt 159.5 lb

## 2022-10-14 DIAGNOSIS — D696 Thrombocytopenia, unspecified: Secondary | ICD-10-CM | POA: Insufficient documentation

## 2022-10-14 DIAGNOSIS — Z800 Family history of malignant neoplasm of digestive organs: Secondary | ICD-10-CM | POA: Diagnosis not present

## 2022-10-14 DIAGNOSIS — R161 Splenomegaly, not elsewhere classified: Secondary | ICD-10-CM | POA: Insufficient documentation

## 2022-10-14 DIAGNOSIS — D61818 Other pancytopenia: Secondary | ICD-10-CM

## 2022-10-14 DIAGNOSIS — D649 Anemia, unspecified: Secondary | ICD-10-CM | POA: Insufficient documentation

## 2022-10-14 DIAGNOSIS — N189 Chronic kidney disease, unspecified: Secondary | ICD-10-CM | POA: Insufficient documentation

## 2022-10-14 DIAGNOSIS — K7460 Unspecified cirrhosis of liver: Secondary | ICD-10-CM | POA: Diagnosis not present

## 2022-10-14 LAB — CMP (CANCER CENTER ONLY)
ALT: 29 U/L (ref 0–44)
AST: 30 U/L (ref 15–41)
Albumin: 3.7 g/dL (ref 3.5–5.0)
Alkaline Phosphatase: 51 U/L (ref 38–126)
Anion gap: 7 (ref 5–15)
BUN: 28 mg/dL — ABNORMAL HIGH (ref 8–23)
CO2: 24 mmol/L (ref 22–32)
Calcium: 9.6 mg/dL (ref 8.9–10.3)
Chloride: 112 mmol/L — ABNORMAL HIGH (ref 98–111)
Creatinine: 1.5 mg/dL — ABNORMAL HIGH (ref 0.61–1.24)
GFR, Estimated: 46 mL/min — ABNORMAL LOW (ref >60)
Glucose, Bld: 162 mg/dL — ABNORMAL HIGH (ref 70–99)
Potassium: 4.4 mmol/L (ref 3.5–5.1)
Sodium: 143 mmol/L (ref 135–145)
Total Bilirubin: 0.8 mg/dL (ref 0.3–1.2)
Total Protein: 6.1 g/dL — ABNORMAL LOW (ref 6.5–8.1)

## 2022-10-14 LAB — CBC WITH DIFFERENTIAL (CANCER CENTER ONLY)
Abs Immature Granulocytes: 0.01 10*3/uL (ref 0.00–0.07)
Basophils Absolute: 0 10*3/uL (ref 0.0–0.1)
Basophils Relative: 0 %
Eosinophils Absolute: 0.3 10*3/uL (ref 0.0–0.5)
Eosinophils Relative: 6 %
HCT: 28.9 % — ABNORMAL LOW (ref 39.0–52.0)
Hemoglobin: 9.4 g/dL — ABNORMAL LOW (ref 13.0–17.0)
Immature Granulocytes: 0 %
Lymphocytes Relative: 26 %
Lymphs Abs: 1.2 10*3/uL (ref 0.7–4.0)
MCH: 33.3 pg (ref 26.0–34.0)
MCHC: 32.5 g/dL (ref 30.0–36.0)
MCV: 102.5 fL — ABNORMAL HIGH (ref 80.0–100.0)
Monocytes Absolute: 0.4 10*3/uL (ref 0.1–1.0)
Monocytes Relative: 8 %
Neutro Abs: 2.7 10*3/uL (ref 1.7–7.7)
Neutrophils Relative %: 60 %
Platelet Count: 35 10*3/uL — ABNORMAL LOW (ref 150–400)
RBC: 2.82 MIL/uL — ABNORMAL LOW (ref 4.22–5.81)
RDW: 15.3 % (ref 11.5–15.5)
WBC Count: 4.5 10*3/uL (ref 4.0–10.5)
nRBC: 0 % (ref 0.0–0.2)

## 2022-10-14 LAB — SAMPLE TO BLOOD BANK

## 2022-10-14 LAB — IRON AND IRON BINDING CAPACITY (CC-WL,HP ONLY)
Iron: 89 ug/dL (ref 45–182)
Saturation Ratios: 28 % (ref 17.9–39.5)
TIBC: 318 ug/dL (ref 250–450)
UIBC: 229 ug/dL (ref 117–376)

## 2022-10-14 LAB — FOLATE: Folate: >40 ng/mL (ref >5.9)

## 2022-10-14 LAB — FERRITIN: Ferritin: 46 ng/mL (ref 24–336)

## 2022-10-14 LAB — LACTATE DEHYDROGENASE: LDH: 237 U/L — ABNORMAL HIGH (ref 98–192)

## 2022-10-14 LAB — VITAMIN B12: Vitamin B-12: 707 pg/mL (ref 180–914)

## 2022-10-17 ENCOUNTER — Telehealth: Payer: Self-pay | Admitting: Medical Oncology

## 2022-10-17 ENCOUNTER — Other Ambulatory Visit: Payer: Self-pay | Admitting: Medical Oncology

## 2022-10-17 ENCOUNTER — Telehealth: Payer: Self-pay | Admitting: Cardiovascular Disease

## 2022-10-17 ENCOUNTER — Encounter: Payer: Self-pay | Admitting: Gastroenterology

## 2022-10-17 DIAGNOSIS — D649 Anemia, unspecified: Secondary | ICD-10-CM

## 2022-10-17 NOTE — Telephone Encounter (Signed)
New Message:     Wife called. She said when the patient saw the Hematologist on 10-14-22. She said she thought the patient should see a GI doctor. Who will do that referral Dr Flora Lipps or the Hematologist?

## 2022-10-17 NOTE — Telephone Encounter (Signed)
GI referral-Please enter referral to Dr. Russella Dar @ [facility] .  Wife  called Dr Ardell Isaacs office  to get an appt and [place]  told her the GI referral has to come from [facility] not  Dr. Carollee Massed has a tentative appt  to see Dr Russella Dar on 09/24. However, [place] said she needs the referral from [facility] office.

## 2022-10-17 NOTE — Telephone Encounter (Signed)
Returned call to pt wife Stark Klein.LVM to let her know the hematologist doctor should be referring if they are the ones recommending it.

## 2022-11-12 ENCOUNTER — Ambulatory Visit: Payer: Medicare HMO | Admitting: Gastroenterology

## 2022-11-12 ENCOUNTER — Encounter: Payer: Self-pay | Admitting: Gastroenterology

## 2022-11-12 ENCOUNTER — Other Ambulatory Visit (INDEPENDENT_AMBULATORY_CARE_PROVIDER_SITE_OTHER): Payer: Medicare HMO

## 2022-11-12 VITALS — BP 150/68 | HR 66 | Ht 70.0 in | Wt 158.0 lb

## 2022-11-12 DIAGNOSIS — D509 Iron deficiency anemia, unspecified: Secondary | ICD-10-CM | POA: Diagnosis not present

## 2022-11-12 DIAGNOSIS — I81 Portal vein thrombosis: Secondary | ICD-10-CM

## 2022-11-12 DIAGNOSIS — R109 Unspecified abdominal pain: Secondary | ICD-10-CM

## 2022-11-12 DIAGNOSIS — K7460 Unspecified cirrhosis of liver: Secondary | ICD-10-CM

## 2022-11-12 LAB — COMPREHENSIVE METABOLIC PANEL
ALT: 28 U/L (ref 0–53)
AST: 29 U/L (ref 0–37)
Albumin: 3.7 g/dL (ref 3.5–5.2)
Alkaline Phosphatase: 51 U/L (ref 39–117)
BUN: 25 mg/dL — ABNORMAL HIGH (ref 6–23)
CO2: 23 mEq/L (ref 19–32)
Calcium: 9.6 mg/dL (ref 8.4–10.5)
Chloride: 112 mEq/L (ref 96–112)
Creatinine, Ser: 1.51 mg/dL — ABNORMAL HIGH (ref 0.40–1.50)
GFR: 42.19 mL/min — ABNORMAL LOW (ref >60.00)
Glucose, Bld: 192 mg/dL — ABNORMAL HIGH (ref 70–99)
Potassium: 4.8 mEq/L (ref 3.5–5.1)
Sodium: 143 mEq/L (ref 135–145)
Total Bilirubin: 0.9 mg/dL (ref 0.2–1.2)
Total Protein: 6.3 g/dL (ref 6.0–8.3)

## 2022-11-12 LAB — CBC WITH DIFFERENTIAL/PLATELET
Basophils Absolute: 0 10*3/uL (ref 0.0–0.1)
Basophils Relative: 0.4 % (ref 0.0–3.0)
Eosinophils Absolute: 0.2 10*3/uL (ref 0.0–0.7)
Eosinophils Relative: 3.8 % (ref 0.0–5.0)
HCT: 29.8 % — ABNORMAL LOW (ref 39.0–52.0)
Hemoglobin: 10.1 g/dL — ABNORMAL LOW (ref 13.0–17.0)
Lymphocytes Relative: 21.6 % (ref 12.0–46.0)
Lymphs Abs: 1 10*3/uL (ref 0.7–4.0)
MCHC: 34 g/dL (ref 30.0–36.0)
MCV: 98.7 fl (ref 78.0–100.0)
Monocytes Absolute: 0.4 10*3/uL (ref 0.1–1.0)
Monocytes Relative: 7.8 % (ref 3.0–12.0)
Neutro Abs: 3.2 10*3/uL (ref 1.4–7.7)
Neutrophils Relative %: 66.4 % (ref 43.0–77.0)
Platelets: 48 10*3/uL — CL (ref 150.0–400.0)
RBC: 3.02 Mil/uL — ABNORMAL LOW (ref 4.22–5.81)
RDW: 15.5 % (ref 11.5–15.5)
WBC: 4.8 10*3/uL (ref 4.0–10.5)

## 2022-11-12 LAB — PROTIME-INR
INR: 1.2 ratio — ABNORMAL HIGH (ref 0.8–1.0)
Prothrombin Time: 12.2 s (ref 9.6–13.1)

## 2022-11-12 NOTE — Patient Instructions (Addendum)
_______________________________________________________  If your blood pressure at your visit was 140/90 or greater, please contact your primary care physician to follow up on this.  _______________________________________________________  If you are age 84 or older, your body mass index should be between 23-30. Your Body mass index is 22.67 kg/m. If this is out of the aforementioned range listed, please consider follow up with your Primary Care Provider.  If you are age 46 or younger, your body mass index should be between 19-25. Your Body mass index is 22.67 kg/m. If this is out of the aformentioned range listed, please consider follow up with your Primary Care Provider.   ________________________________________________________   Bonita Quin have been scheduled for an abdominal ultrasound at [facility] (1st floor of hospital) on 11-19-2022 at 9:00AM. Please arrive 30 minutes prior to your appointment for registration. Make certain not to have anything to eat or drink 6 hours prior to your appointment. Should you need to reschedule your appointment, please contact radiology at [phone]. This test typically takes about 30 minutes to perform.   Due to recent changes in healthcare laws, you may see the results of your imaging and laboratory studies on MyChart before your provider has had a chance to review them.  We understand that in some cases there may be results that are confusing or concerning to you. Not all laboratory results come back in the same time frame and the provider may be waiting for multiple results in order to interpret others.  Please give [place] 48 hours in order for your provider to thoroughly review all the results before contacting the office for clarification of your results.    Use IBGARD as directed. We have given you samples to try!   It was a pleasure to see you today!  Thank you for trusting me with your gastrointestinal care!

## 2022-11-12 NOTE — Progress Notes (Signed)
Chief Complaint: IDA and Cirrhosis Primary GI MD: Dr. 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  Last seen in 2018 by Dr. Russella Dar. Follows with Dr. Cyndie Chime for IDA and ITP.  History of colonoscopy by Dr. Virginia Rochester in 2010  US abdomen complete 01/2017 with 3 mm gallbladder polyp.  Possible cholecystitis.  Liver shows fatty infiltration without focal lesion  Lab scanned in 2022 with hgb 9.8  Labs 10/14/2022 Hgb 9.4, MCV 102.5 Iron 89, TIBC 318, saturation 28% Ferritin 46 Vitamin B12 707 LFTs normal  Patient presents today due to worsening anemia.  Denies overt bleeding.  Appears may be his baseline Hgb has been around 9 since at least 2022.  He is scheduled to see Annamarie Major of [facility] in October.  Denies nausea/vomiting.  Denies dysphagia.  Denies weight loss.  Notes he has some lower abdominal cramping if he eats fatty/greasy foods.  Having adequate bowel movements.  Patient is on aspirin daily.  He is not on nadolol/carvedilol.  Today his pulse is 66 and his BP is 150/68.  His wife states he is very active and he often mows the lawn (ride mower) and prunes the bushes regularly.  She states he would trim the trees if she would let him.  PREVIOUS GI WORKUP   EGD 09/2017 for surveillance - LA grade a reflux esophagitis - Grade 1 esophageal varices - Portal hypertensive gastropathy - Erosive gastritis - Repeat EGD in 1 year  EGD 08/2016 for surveillance - LA grade a reflux esophagitis - Nonbleeding grade 1 esophageal varices - Mild portal hypertensive gastropathy - Normal duodenal bulb - No specimens collected  EGD  07/2015 for IDA and surveillance - Nonbleeding grade 1 esophageal varices - Portal hypertensive gastropathy - Normal duodenal bulb - No specimens collected  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   Cirrhosis of liver (HCC)    Clotting disorder (HCC)    Colon polyps    CVA (cerebral vascular accident) (HCC) ~ 2000   "minor stroke", denies residual on 09/27/2015   DVT (deep venous thrombosis) (HCC) 03/20/2013   RIGHT LEG AND LEFT LEG   DVT (deep venous thrombosis) (HCC) 02/2013   RLE   Esophageal varices (HCC)    Essential hypertension, benign 03/20/2013   History of blood transfusion 1998   "blood clot began bleeding"   Hyperlipidemia    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   Leukopenia 08/11/2012   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HCC) 1996   Mitral regurgitation    Pancytopenia, acquired (HCC) 09/17/2016   Due to non alcoholic cirhosis   Splenomegaly    Type II diabetes mellitus (HCC) dx'd in the 1990s    Past Surgical History:  Procedure Laterality Date   BIOPSY  10/14/2017   Procedure: BIOPSY;  Surgeon: Meryl Dare, MD;  Location: [facility];  Service: Endoscopy;;   CATARACT EXTRACTION W/ INTRAOCULAR LENS IMPLANT Right 06/26/2012   CATARACT EXTRACTION W/ INTRAOCULAR LENS IMPLANT Left 07/27/2012   COLONOSCOPY  ESOPHAGOGASTRODUODENOSCOPY (EGD) WITH PROPOFOL N/A 09/03/2016   Procedure: ESOPHAGOGASTRODUODENOSCOPY (EGD) WITH PROPOFOL;  Surgeon: Meryl Dare, MD;  Location: [facility];  Service: Endoscopy;  Laterality: N/A;   ESOPHAGOGASTRODUODENOSCOPY (EGD) WITH PROPOFOL N/A 10/14/2017   Procedure: ESOPHAGOGASTRODUODENOSCOPY (EGD) WITH PROPOFOL;  Surgeon: Meryl Dare, MD;  Location: [facility];  Service: Endoscopy;  Laterality: N/A;    Current  Outpatient Medications  Medication Sig Dispense Refill   aspirin EC 81 MG tablet Take 81 mg by mouth daily.     atorvastatin (LIPITOR) 40 MG tablet Take 40 mg by mouth every evening.      calcium carbonate (TUMS - DOSED IN MG ELEMENTAL CALCIUM) 500 MG chewable tablet Chew 1 tablet by mouth daily as needed for indigestion or heartburn.     ferrous sulfate 325 (65 FE) MG EC tablet Take 1 tablet (325 mg total) by mouth 2 (two) times daily with a meal. 60 tablet 3   furosemide (LASIX) 20 MG tablet Take 1 tablet (20 mg total) by mouth daily as needed. AS NEEDED FOR SWELLING 30 tablet 3   glimepiride (AMARYL) 2 MG tablet Take 2 mg by mouth every morning.     levothyroxine (SYNTHROID) 50 MCG tablet Take 50 mcg by mouth daily before breakfast.     linagliptin (TRADJENTA) 5 MG TABS tablet Take 5 mg by mouth daily.     losartan (COZAAR) 50 MG tablet Take 50 mg by mouth daily.      Lutein 6 MG TABS Take 6 mg by mouth daily.     metFORMIN (GLUCOPHAGE) 1000 MG tablet Take 1,000 mg by mouth 2 (two) times daily with a meal.     ONETOUCH ULTRA test strip daily.     SYNTHROID 25 MCG tablet Take 25 mcg by mouth every morning.     No current facility-administered medications for this visit.    Allergies as of 11/12/2022 - Review Complete 10/14/2022  Allergen Reaction Noted   Pravastatin sodium Other (See Comments) 07/24/2020    Family History  Problem Relation Age of Onset   Alzheimer's disease Father    Dementia Father    Cancer Sister        unknown type   Diabetes Sister    Deep vein thrombosis Sister        Varicose vein   Heart disease Mother        After age 17   Hypertension Mother    Pancreatic cancer Son     Social History   Socioeconomic History   Marital status: Married    Spouse name: Not on file   Number of children: 1   Years of education: 12   Highest education level: Not on file  Occupational History   Occupation: retired   Occupation: Retail buyer  Tobacco Use    Smoking status: Never   Smokeless tobacco: Never  Vaping Use   Vaping status: Never Used  Substance and Sexual Activity   Alcohol use: No    Alcohol/week: 0.0 standard drinks of alcohol   Drug use: No   Sexual activity: Never  Other Topics Concern   Not on file  Social History Narrative   Patient drinks 1 cup of coffee daily.   Patient is left handed.   Social Determinants of Health   Financial Resource Strain: Not on file  Food Insecurity: Not on file  Transportation Needs: Not on file  Physical Activity: Not on file  Stress: Not on file  Social Connections: Not on file  Intimate Partner Violence: Not on file    Review of Systems:    Constitutional: No weight loss, fever, chills, weakness or fatigue HEENT: Eyes: No change in vision               Ears, Nose, Throat:  No change in hearing or congestion Skin: No rash or itching Cardiovascular: No chest pain, chest pressure or palpitations   Respiratory: No SOB or cough Gastrointestinal: See HPI and otherwise negative Genitourinary: No dysuria or change in urinary frequency Neurological: No headache, dizziness or syncope Musculoskeletal: No new muscle or joint pain Hematologic: No bleeding or bruising Psychiatric: No history of depression or anxiety    Physical Exam:  Vital signs: There were no vitals taken for this visit.  Constitutional: Frail elderly male sitting comfortably on exam table  head:  Normocephalic and atraumatic. Eyes:   PEERL, EOMI. No icterus. Conjunctiva pink. Respiratory: Respirations even and unlabored. Lungs clear to auscultation bilaterally.   No wheezes, crackles, or rhonchi.  Cardiovascular:  Regular rate and rhythm. No peripheral edema, cyanosis or pallor.  Gastrointestinal:  Soft, nondistended, nontender. No rebound or guarding. Normal bowel sounds. No appreciable masses or hepatomegaly. Rectal:  Not performed.  Msk:  Symmetrical without gross deformities. Without edema, no deformity or  joint abnormality.  Neurologic:  Alert and  oriented x4;  grossly normal neurologically.  Skin:   Dry and intact without significant lesions or rashes. Psychiatric: Oriented to person, place and time. Demonstrates good judgement and reason without abnormal affect or behaviors.   RELEVANT LABS AND IMAGING: CBC    Component Value Date/Time   WBC 4.5 10/14/2022 1246   WBC 4.2 10/02/2015 0722   RBC 2.82 (L) 10/14/2022 1246   HGB 9.4 (L) 10/14/2022 1246   HGB 10.6 (L) 09/17/2016 0918   HGB 12.9 (L) 01/19/2013 0946   HCT 28.9 (L) 10/14/2022 1246   HCT 31.6 (L) 09/17/2016 0918   HCT 37.6 (L) 01/19/2013 0946   PLT 35 (L) 10/14/2022 1246   PLT 41 (LL) 09/17/2016 0918   MCV 102.5 (H) 10/14/2022 1246   MCV 96 09/17/2016 0918   MCV 95.3 01/19/2013 0946   MCH 33.3 10/14/2022 1246   MCHC 32.5 10/14/2022 1246   RDW 15.3 10/14/2022 1246   RDW 15.2 09/17/2016 0918   RDW 14.7 (H) 01/19/2013 0946   LYMPHSABS 1.2 10/14/2022 1246   LYMPHSABS 0.9 09/17/2016 0918   LYMPHSABS 1.2 01/19/2013 0946   MONOABS 0.4 10/14/2022 1246   MONOABS 0.5 01/19/2013 0946   EOSABS 0.3 10/14/2022 1246   EOSABS 0.1 09/17/2016 0918   BASOSABS 0.0 10/14/2022 1246   BASOSABS 0.0 09/17/2016 0918   BASOSABS 0.0 01/19/2013 0946    CMP     Component Value Date/Time   NA 143 10/14/2022 1246   NA 144 09/17/2016 0918   NA 140 09/01/2012 0933   K 4.4 10/14/2022 1246   K 4.6 09/01/2012 0933   CL 112 (H) 10/14/2022 1246   CO2 24 10/14/2022 1246   CO2 24 09/01/2012 0933   GLUCOSE 162 (H) 10/14/2022 1246   GLUCOSE 295 (H) 09/01/2012 0933   BUN 28 (H) 10/14/2022 1246   BUN 18 09/17/2016 0918   BUN 16.2 09/01/2012 0933   CREATININE 1.50 (H) 10/14/2022 1246   CREATININE 1.1 09/01/2012 0933   CALCIUM 9.6 10/14/2022 1246   CALCIUM 9.2 09/01/2012 0933   PROT 6.1 (L) 10/14/2022 1246   PROT 5.8 (L) 09/17/2016 0918   PROT 6.4 09/01/2012 0933  ALBUMIN 3.7 10/14/2022 1246   ALBUMIN 3.6 09/17/2016 0918   ALBUMIN 3.4 (L)  09/01/2012 0933   AST 30 10/14/2022 1246   AST 27 09/01/2012 0933   ALT 29 10/14/2022 1246   ALT 28 09/01/2012 0933   ALKPHOS 51 10/14/2022 1246   ALKPHOS 55 09/01/2012 0933   BILITOT 0.8 10/14/2022 1246   BILITOT 0.91 09/01/2012 0933   GFRNONAA 46 (L) 10/14/2022 1246   GFRAA 76 09/17/2016 0918     Assessment/Plan:   82 year old male 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 Hgb 9.8 in 2022 --> 9.22 September 2022  Anemia Multifactorial but with history of varices and AVMs cannot rule out GI blood loss.  Iron studies were normal August 2024 no overt bleeding at this time. Hgb stable compared to 2022. -CBC, CMP, PT/INR - Patient has significant comorbidities including his age though is very active for his age.  Will discuss possible EGD with Dr. Russella Dar for surveillance of varices and if negative possible capsule endoscopy - PPI twice daily  Cirrhosis Last surveillance EGD was 2019 as well as last ultrasound.  No asterixis on exam.  - CBC, CMP, PT/INR - RUQ ultrasound - Discussed with Dr. Russella Dar EGD for surveillance of varices - Proceed with appointment with liver clinic  Lara Mulch [facility] 11/12/2022, 9:36 AM  Cc: Merri Brunette, MD

## 2022-11-15 NOTE — Progress Notes (Signed)
Prior to EGD need to assess chronic PVT, abdominal venous collaterals and screen for HCC. Abd [place] with doppler and then schedule abd CT.

## 2022-11-18 NOTE — Addendum Note (Signed)
Addended by: Richardson Chiquito on: 11/18/2022 04:11 PM   Modules accepted: Orders

## 2022-11-18 NOTE — Progress Notes (Signed)
-----   Message ----- From: Jearl Klinefelter Sent: 11/18/2022   8:26 AM EDT To: Richardson Chiquito, RN  Hey there! Can we do abdominal US liver doppler on this patient and after that also do CT ab/pelvis with contrast per Dr. Ardell Isaacs recommendations? Thank you!

## 2022-11-18 NOTE — Progress Notes (Signed)
Patient's u/s scheduled for 11/19/22 at [facility] has been cancelled.   Patient has been rescheduled for u/s with liver doppler at [facility] [address]) on 11/27/22 at 9 am, 840 am arrival, NPO midnight. Test will take 90 minutes.   Patient has also been scheduled for CT abdomen w/wo on 12/02/22 at 120 pm, 100 pm arrival, NPO 4 hours prior. Needs to pick up contrast at the time of u/s with liver doppler.  I have spoken to patient's wife/caregiver to advise of the above changes to tests/dates/locations/preps as well as the addition of CT abdomen. She verbalizes understanding of all changes and denies any additional questions.

## 2022-11-19 ENCOUNTER — Ambulatory Visit (HOSPITAL_COMMUNITY): Payer: Medicare HMO

## 2022-11-19 ENCOUNTER — Encounter: Payer: Medicare HMO | Admitting: Gastroenterology

## 2022-11-22 ENCOUNTER — Telehealth: Payer: Self-pay | Admitting: Gastroenterology

## 2022-11-22 NOTE — Telephone Encounter (Signed)
Wife wanted to confirm whether patient should be NPO prior to upcomiong abdominal ultrasound/liver doppler. I advised that he should remain NPO morning of his procedure (procedure at 9 am)

## 2022-11-22 NOTE — Telephone Encounter (Signed)
Inbound call from patient wife's wishing to speak further about procedures that have been scheduled for patient. Please advise, thank you.

## 2022-11-27 ENCOUNTER — Ambulatory Visit
Admission: RE | Admit: 2022-11-27 | Discharge: 2022-11-27 | Disposition: A | Discharge status: Home / Self Care | Payer: Medicare HMO | Source: Ambulatory Visit | Attending: Gastroenterology | Admitting: Gastroenterology

## 2022-11-27 DIAGNOSIS — K7460 Unspecified cirrhosis of liver: Secondary | ICD-10-CM

## 2022-11-27 DIAGNOSIS — R109 Unspecified abdominal pain: Secondary | ICD-10-CM

## 2022-11-27 DIAGNOSIS — D509 Iron deficiency anemia, unspecified: Secondary | ICD-10-CM

## 2022-11-27 DIAGNOSIS — K766 Portal hypertension: Secondary | ICD-10-CM | POA: Diagnosis not present

## 2022-11-27 DIAGNOSIS — R932 Abnormal findings on diagnostic imaging of liver and biliary tract: Secondary | ICD-10-CM | POA: Diagnosis not present

## 2022-11-29 NOTE — Telephone Encounter (Signed)
Spoke with patients' wife who states she no longer had a question. States she found her notes explaining the test.

## 2022-11-29 NOTE — Telephone Encounter (Signed)
Wife calls stating that [facility] called to tell her they did not do the liver doppler that was originally ordered with ultrasound. From [facility], it looks like the order was cancelled out.  Per Gearldine Bienenstock at [facility], since patient has not had a TIPS procedure, order should be under liver doppler. She was calling patient to schedule liver doppler.

## 2022-11-29 NOTE — Telephone Encounter (Signed)
Inbound call from patient wife calling in regards to upcoming ct. Please advise.

## 2022-11-29 NOTE — Telephone Encounter (Signed)
Patients wife called stating she wanted speak with Dottie. Please advise.   [phone]

## 2022-12-02 ENCOUNTER — Ambulatory Visit
Admission: RE | Admit: 2022-12-02 | Discharge: 2022-12-02 | Disposition: A | Discharge status: Home / Self Care | Payer: Medicare HMO | Source: Ambulatory Visit | Attending: Gastroenterology | Admitting: Gastroenterology

## 2022-12-02 DIAGNOSIS — K7460 Unspecified cirrhosis of liver: Secondary | ICD-10-CM | POA: Diagnosis not present

## 2022-12-02 DIAGNOSIS — R109 Unspecified abdominal pain: Secondary | ICD-10-CM

## 2022-12-02 MED ORDER — IOPAMIDOL (ISOVUE-300) INJECTION 61%
500.0000 mL | Freq: Once | INTRAVENOUS | Status: AC | PRN
  Administered 2022-12-02: 80 mL via INTRAVENOUS

## 2022-12-04 ENCOUNTER — Telehealth: Payer: Self-pay | Admitting: Gastroenterology

## 2022-12-04 NOTE — Telephone Encounter (Signed)
PT is calling to discuss pathology results. I advised it had not been read yet and that a nurse will reach out once it has been done.

## 2022-12-09 DIAGNOSIS — I81 Portal vein thrombosis: Secondary | ICD-10-CM | POA: Diagnosis not present

## 2022-12-09 DIAGNOSIS — K3189 Other diseases of stomach and duodenum: Secondary | ICD-10-CM | POA: Diagnosis not present

## 2022-12-09 DIAGNOSIS — K766 Portal hypertension: Secondary | ICD-10-CM | POA: Diagnosis not present

## 2022-12-09 DIAGNOSIS — E440 Moderate protein-calorie malnutrition: Secondary | ICD-10-CM | POA: Diagnosis not present

## 2022-12-09 DIAGNOSIS — I8510 Secondary esophageal varices without bleeding: Secondary | ICD-10-CM | POA: Diagnosis not present

## 2022-12-09 DIAGNOSIS — K7469 Other cirrhosis of liver: Secondary | ICD-10-CM | POA: Diagnosis not present

## 2022-12-16 NOTE — Telephone Encounter (Signed)
Inbound call from patient's wife, calling in regards to ultrasound results. She states it has been over three weeks and they are extremely anxious for the results. Would like to discuss with a nurse.

## 2022-12-16 NOTE — Telephone Encounter (Signed)
Requested expedited reading by [facility] this morning.

## 2023-01-29 DIAGNOSIS — E1159 Type 2 diabetes mellitus with other circulatory complications: Secondary | ICD-10-CM | POA: Diagnosis not present

## 2023-02-05 DIAGNOSIS — E1165 Type 2 diabetes mellitus with hyperglycemia: Secondary | ICD-10-CM | POA: Diagnosis not present

## 2023-02-05 DIAGNOSIS — E7800 Pure hypercholesterolemia, unspecified: Secondary | ICD-10-CM | POA: Diagnosis not present

## 2023-02-05 DIAGNOSIS — N1831 Chronic kidney disease, stage 3a: Secondary | ICD-10-CM | POA: Diagnosis not present

## 2023-02-05 DIAGNOSIS — I10 Essential (primary) hypertension: Secondary | ICD-10-CM | POA: Diagnosis not present

## 2023-02-05 DIAGNOSIS — E039 Hypothyroidism, unspecified: Secondary | ICD-10-CM | POA: Diagnosis not present

## 2023-02-25 ENCOUNTER — Telehealth: Payer: Self-pay | Admitting: Internal Medicine

## 2023-02-25 NOTE — Telephone Encounter (Signed)
 Rescheduled appointments per scheduling message. Spoke to the patients wife and the patient is aware of the changes made. He will be mailed an appointment reminder.

## 2023-03-05 DIAGNOSIS — H40013 Open angle with borderline findings, low risk, bilateral: Secondary | ICD-10-CM | POA: Diagnosis not present

## 2023-03-05 DIAGNOSIS — H0102A Squamous blepharitis right eye, upper and lower eyelids: Secondary | ICD-10-CM | POA: Diagnosis not present

## 2023-03-05 DIAGNOSIS — E119 Type 2 diabetes mellitus without complications: Secondary | ICD-10-CM | POA: Diagnosis not present

## 2023-03-05 DIAGNOSIS — H02831 Dermatochalasis of right upper eyelid: Secondary | ICD-10-CM | POA: Diagnosis not present

## 2023-03-05 DIAGNOSIS — H1045 Other chronic allergic conjunctivitis: Secondary | ICD-10-CM | POA: Diagnosis not present

## 2023-03-05 DIAGNOSIS — H0102B Squamous blepharitis left eye, upper and lower eyelids: Secondary | ICD-10-CM | POA: Diagnosis not present

## 2023-03-05 DIAGNOSIS — H04123 Dry eye syndrome of bilateral lacrimal glands: Secondary | ICD-10-CM | POA: Diagnosis not present

## 2023-03-05 DIAGNOSIS — Z961 Presence of intraocular lens: Secondary | ICD-10-CM | POA: Diagnosis not present

## 2023-03-05 DIAGNOSIS — H02834 Dermatochalasis of left upper eyelid: Secondary | ICD-10-CM | POA: Diagnosis not present

## 2023-04-10 ENCOUNTER — Ambulatory Visit: Payer: Medicare HMO

## 2023-04-10 ENCOUNTER — Ambulatory Visit (HOSPITAL_COMMUNITY): Payer: Medicare Other

## 2023-04-16 ENCOUNTER — Other Ambulatory Visit: Payer: Medicare HMO

## 2023-04-16 ENCOUNTER — Ambulatory Visit: Payer: Medicare HMO | Admitting: Internal Medicine

## 2023-04-17 ENCOUNTER — Inpatient Hospital Stay: Payer: Medicare Other | Attending: Internal Medicine

## 2023-04-17 ENCOUNTER — Inpatient Hospital Stay: Payer: Medicare Other | Admitting: Internal Medicine

## 2023-04-17 VITALS — BP 166/54 | HR 65 | Temp 98.4°F | Resp 16 | Ht 70.0 in | Wt 164.0 lb

## 2023-04-17 DIAGNOSIS — D61818 Other pancytopenia: Secondary | ICD-10-CM | POA: Diagnosis not present

## 2023-04-17 DIAGNOSIS — D649 Anemia, unspecified: Secondary | ICD-10-CM | POA: Diagnosis not present

## 2023-04-17 DIAGNOSIS — K7460 Unspecified cirrhosis of liver: Secondary | ICD-10-CM | POA: Insufficient documentation

## 2023-04-17 DIAGNOSIS — I8510 Secondary esophageal varices without bleeding: Secondary | ICD-10-CM | POA: Insufficient documentation

## 2023-04-17 DIAGNOSIS — D6959 Other secondary thrombocytopenia: Secondary | ICD-10-CM | POA: Diagnosis not present

## 2023-04-17 LAB — CBC WITH DIFFERENTIAL (CANCER CENTER ONLY)
Abs Immature Granulocytes: 0.02 10*3/uL (ref 0.00–0.07)
Basophils Absolute: 0 10*3/uL (ref 0.0–0.1)
Basophils Relative: 0 %
Eosinophils Absolute: 0.3 10*3/uL (ref 0.0–0.5)
Eosinophils Relative: 6 %
HCT: 29 % — ABNORMAL LOW (ref 39.0–52.0)
Hemoglobin: 9.4 g/dL — ABNORMAL LOW (ref 13.0–17.0)
Immature Granulocytes: 0 %
Lymphocytes Relative: 22 %
Lymphs Abs: 1.2 10*3/uL (ref 0.7–4.0)
MCH: 32.5 pg (ref 26.0–34.0)
MCHC: 32.4 g/dL (ref 30.0–36.0)
MCV: 100.3 fL — ABNORMAL HIGH (ref 80.0–100.0)
Monocytes Absolute: 0.5 10*3/uL (ref 0.1–1.0)
Monocytes Relative: 9 %
Neutro Abs: 3.6 10*3/uL (ref 1.7–7.7)
Neutrophils Relative %: 63 %
Platelet Count: 38 10*3/uL — ABNORMAL LOW (ref 150–400)
RBC: 2.89 MIL/uL — ABNORMAL LOW (ref 4.22–5.81)
RDW: 15.4 % (ref 11.5–15.5)
WBC Count: 5.8 10*3/uL (ref 4.0–10.5)
nRBC: 0 % (ref 0.0–0.2)

## 2023-04-17 NOTE — Progress Notes (Signed)
 [facility] [phone]   [phone]) [phone]  OFFICE PROGRESS NOTE  Joshua Brunette, MD 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 2) persistent anemia secondary to possible gastrointestinal blood loss and history of esophageal varices.  PRIOR THERAPY: None  CURRENT THERAPY: Observation  INTERVAL HISTORY: [name] 83 y.o. male returns to the clinic today for follow-up visit accompanied by his wife Stark Klein.Discussed the use of AI scribe software for clinical note transcription with the patient, who gave verbal consent to proceed.  History of Present Illness   [name] is an 83 year old male with liver cirrhosis and anemia who presents for follow-up of anemia and thrombocytopenia. He is accompanied by Stark Klein, his wife.  He has a history of liver cirrhosis and has been experiencing low platelet counts for years. He has no current symptoms of bleeding or bruising.  He also has anemia, attributed to occasional gastrointestinal blood loss due to esophageal varices. These varices are monitored by his gastroenterologist.  He experiences occasional dizziness, particularly when rising in the morning or bending over, and sometimes has an upset stomach. No recent bleeding, such as coughing or vomiting blood, or blood in his stool. No epistaxis or gum bleeding, but he bruises easily.  His current medications include iron supplements, which he has run out of and has not yet replenished. His hemoglobin level is 9.4, consistent with previous measurements, and his platelet count is 38,000, similar to past results.       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    Cirrhosis of liver (HCC)    Clotting disorder (HCC)    Colon polyps    CVA (cerebral vascular accident) (HCC) ~ 2000   "minor stroke", denies residual on 09/27/2015   DVT (deep venous thrombosis) (HCC) 03/20/2013   RIGHT LEG AND LEFT LEG   DVT (deep venous thrombosis) (HCC) 02/2013   RLE   Esophageal varices (HCC)    Essential hypertension, benign 03/20/2013   History of blood transfusion 1998   "blood clot began bleeding"   Hyperlipidemia    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  MEDICATIONS:  Current Outpatient Medications  Medication Sig Dispense Refill   aspirin EC 81 MG tablet Take 81 mg by mouth daily.     atorvastatin (LIPITOR) 40 MG tablet Take 40 mg by mouth every evening.      calcium carbonate (TUMS - DOSED IN MG ELEMENTAL CALCIUM) 500 MG chewable tablet Chew 1 tablet by mouth daily as needed for indigestion or heartburn.     ferrous sulfate 325 (65 FE) MG EC tablet Take 1 tablet (325 mg total) by mouth 2 (two) times daily with a meal. 60 tablet 3   furosemide (LASIX) 20 MG tablet Take 1 tablet (20 mg total) by mouth daily as needed. AS NEEDED FOR SWELLING 30 tablet 3  glimepiride (AMARYL) 2 MG tablet Take 2 mg by mouth every morning.     levothyroxine (SYNTHROID) 50 MCG tablet Take 50 mcg by mouth daily before breakfast.     linagliptin (TRADJENTA) 5 MG TABS tablet Take 5 mg by mouth daily.     losartan (COZAAR) 50 MG tablet Take 50 mg by mouth daily.      Lutein 6 MG TABS Take 6 mg by mouth daily.     metFORMIN (GLUCOPHAGE) 1000 MG tablet Take 1,000 mg by mouth 2 (two) times daily with a meal.     ONETOUCH ULTRA test strip daily.     SYNTHROID 25 MCG tablet Take 25 mcg by  mouth every morning.     No current facility-administered medications for this visit.    SURGICAL HISTORY:  Past Surgical History:  Procedure Laterality Date   BIOPSY  10/14/2017   Procedure: BIOPSY;  Surgeon: Meryl Dare, MD;  Location: [facility];  Service: Endoscopy;;   CATARACT EXTRACTION W/ INTRAOCULAR LENS IMPLANT Right 06/26/2012   CATARACT EXTRACTION W/ INTRAOCULAR LENS IMPLANT Left 07/27/2012   COLONOSCOPY     ESOPHAGOGASTRODUODENOSCOPY (EGD) WITH PROPOFOL N/A 09/03/2016   Procedure: ESOPHAGOGASTRODUODENOSCOPY (EGD) WITH PROPOFOL;  Surgeon: Meryl Dare, MD;  Location: [facility];  Service: Endoscopy;  Laterality: N/A;   ESOPHAGOGASTRODUODENOSCOPY (EGD) WITH PROPOFOL N/A 10/14/2017   Procedure: ESOPHAGOGASTRODUODENOSCOPY (EGD) WITH PROPOFOL;  Surgeon: Meryl Dare, MD;  Location: [facility];  Service: Endoscopy;  Laterality: N/A;    REVIEW OF SYSTEMS:  A comprehensive review of systems was negative except for: Constitutional: positive for fatigue Hematologic/lymphatic: positive for easy bruising   PHYSICAL EXAMINATION: General appearance: alert, cooperative, fatigued, and no distress Head: Normocephalic, without obvious abnormality, atraumatic Neck: no adenopathy, no JVD, supple, symmetrical, trachea midline, and thyroid not enlarged, symmetric, no tenderness/mass/nodules Lymph nodes: Cervical, supraclavicular, and axillary nodes normal. Resp: clear to auscultation bilaterally Back: symmetric, no curvature. ROM normal. No CVA tenderness. Cardio: regular rate and rhythm, S1, S2 normal, no murmur, click, rub or gallop GI: soft, non-tender; bowel sounds normal; no masses,  no organomegaly Extremities: extremities normal, atraumatic, no cyanosis or edema  ECOG PERFORMANCE STATUS: 1 - Symptomatic but completely ambulatory  Blood pressure (!) 150/42, pulse 65, temperature 98.4 F (36.9 C), temperature source Temporal, resp. rate 16, height 5\' 10"  (1.778 m),  weight 164 lb (74.4 kg), SpO2 100%.  LABORATORY DATA: Lab Results  Component Value Date   WBC 5.8 04/17/2023   HGB 9.4 (L) 04/17/2023   HCT 29.0 (L) 04/17/2023   MCV 100.3 (H) 04/17/2023   PLT 38 (L) 04/17/2023      Chemistry      Component Value Date/Time   NA 143 11/12/2022 1054   NA 144 09/17/2016 0918   NA 140 09/01/2012 0933   K 4.8 11/12/2022 1054   K 4.6 09/01/2012 0933   CL 112 11/12/2022 1054   CO2 23 11/12/2022 1054   CO2 24 09/01/2012 0933   BUN 25 (H) 11/12/2022 1054   BUN 18 09/17/2016 0918   BUN 16.2 09/01/2012 0933   CREATININE 1.51 (H) 11/12/2022 1054   CREATININE 1.50 (H) 10/14/2022 1246   CREATININE 1.1 09/01/2012 0933      Component Value Date/Time   CALCIUM 9.6 11/12/2022 1054   CALCIUM 9.2 09/01/2012 0933   ALKPHOS 51 11/12/2022 1054   ALKPHOS 55 09/01/2012 0933   AST 29 11/12/2022 1054   AST 30 10/14/2022 1246   AST 27 09/01/2012 0933  ALT 28 11/12/2022 1054   ALT 29 10/14/2022 1246   ALT 28 09/01/2012 0933   BILITOT 0.9 11/12/2022 1054   BILITOT 0.8 10/14/2022 1246   BILITOT 0.91 09/01/2012 0933       RADIOGRAPHIC STUDIES: No results found.  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    Liver Cirrhosis Chronic liver cirrhosis with complications including thrombocytopenia and esophageal varices. Managed by gastroenterologist Dr. Russella Dar. 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 Platelet count at 38,000, consistent with previous measurement in August. No significant bleeding or bruising. - Monitor blood counts every six months - Instruct to report significant bleeding or bruising  Anemia Chronic anemia likely due to gastrointestinal blood loss and liver cirrhosis. Hemoglobin at 9.4 g/dL,  consistent with previous measurement in August. No significant bleeding or bruising. Emphasized importance of iron supplementation. - Recommend over-the-counter iron tablets, one every other day - Monitor blood counts every six months - Instruct to report significant bleeding or bruising.   He was advised to call immediately if he has any concerning symptoms in the interval.  The patient voices understanding of current disease status and treatment options and is in agreement with the current care plan.  All questions were answered. The patient knows to call the clinic with any problems, questions or concerns. We can certainly see the patient much sooner if necessary.  The total time spent in the appointment was 20 minutes.  Disclaimer: This note was dictated with voice recognition software. Similar sounding words can inadvertently be transcribed and may not be corrected upon review.

## 2023-05-06 DIAGNOSIS — E1165 Type 2 diabetes mellitus with hyperglycemia: Secondary | ICD-10-CM | POA: Diagnosis not present

## 2023-05-06 DIAGNOSIS — E039 Hypothyroidism, unspecified: Secondary | ICD-10-CM | POA: Diagnosis not present

## 2023-05-13 DIAGNOSIS — I10 Essential (primary) hypertension: Secondary | ICD-10-CM | POA: Diagnosis not present

## 2023-05-13 DIAGNOSIS — E1165 Type 2 diabetes mellitus with hyperglycemia: Secondary | ICD-10-CM | POA: Diagnosis not present

## 2023-05-13 DIAGNOSIS — E039 Hypothyroidism, unspecified: Secondary | ICD-10-CM | POA: Diagnosis not present

## 2023-05-13 DIAGNOSIS — E7800 Pure hypercholesterolemia, unspecified: Secondary | ICD-10-CM | POA: Diagnosis not present

## 2023-05-13 DIAGNOSIS — N1831 Chronic kidney disease, stage 3a: Secondary | ICD-10-CM | POA: Diagnosis not present

## 2023-06-09 ENCOUNTER — Other Ambulatory Visit: Payer: Self-pay | Admitting: Nurse Practitioner

## 2023-06-09 DIAGNOSIS — I81 Portal vein thrombosis: Secondary | ICD-10-CM | POA: Diagnosis not present

## 2023-06-09 DIAGNOSIS — K3189 Other diseases of stomach and duodenum: Secondary | ICD-10-CM | POA: Diagnosis not present

## 2023-06-09 DIAGNOSIS — I8510 Secondary esophageal varices without bleeding: Secondary | ICD-10-CM | POA: Diagnosis not present

## 2023-06-09 DIAGNOSIS — K7469 Other cirrhosis of liver: Secondary | ICD-10-CM | POA: Diagnosis not present

## 2023-06-09 DIAGNOSIS — K766 Portal hypertension: Secondary | ICD-10-CM | POA: Diagnosis not present

## 2023-06-17 DIAGNOSIS — E870 Hyperosmolality and hypernatremia: Secondary | ICD-10-CM | POA: Diagnosis not present

## 2023-06-19 ENCOUNTER — Other Ambulatory Visit

## 2023-06-26 ENCOUNTER — Ambulatory Visit
Admission: RE | Admit: 2023-06-26 | Discharge: 2023-06-26 | Disposition: A | Discharge status: Home / Self Care | Source: Ambulatory Visit | Attending: Nurse Practitioner | Admitting: Nurse Practitioner

## 2023-06-26 DIAGNOSIS — I8510 Secondary esophageal varices without bleeding: Secondary | ICD-10-CM

## 2023-06-26 DIAGNOSIS — K8020 Calculus of gallbladder without cholecystitis without obstruction: Secondary | ICD-10-CM | POA: Diagnosis not present

## 2023-06-26 DIAGNOSIS — K7460 Unspecified cirrhosis of liver: Secondary | ICD-10-CM | POA: Diagnosis not present

## 2023-06-26 DIAGNOSIS — K7469 Other cirrhosis of liver: Secondary | ICD-10-CM

## 2023-07-11 ENCOUNTER — Ambulatory Visit
Admission: RE | Admit: 2023-07-11 | Discharge: 2023-07-11 | Disposition: A | Discharge status: Home / Self Care | Source: Ambulatory Visit | Attending: Vascular Surgery | Admitting: Vascular Surgery

## 2023-07-11 ENCOUNTER — Ambulatory Visit (HOSPITAL_COMMUNITY): Admission: RE | Admit: 2023-07-11 | Payer: Medicare Other | Source: Ambulatory Visit

## 2023-07-11 ENCOUNTER — Encounter (HOSPITAL_COMMUNITY): Payer: Self-pay

## 2023-07-11 ENCOUNTER — Ambulatory Visit: Payer: Medicare HMO | Admitting: Physician Assistant

## 2023-07-11 ENCOUNTER — Encounter: Payer: Self-pay | Admitting: Physician Assistant

## 2023-07-11 VITALS — BP 148/69 | HR 56 | Temp 98.2°F

## 2023-07-11 DIAGNOSIS — I6523 Occlusion and stenosis of bilateral carotid arteries: Secondary | ICD-10-CM

## 2023-07-11 NOTE — Progress Notes (Signed)
 History of Present Illness:  Patient is a 83 y.o. year old male who presents for evaluation of carotid stenosis.  We have been closely monitoring his right ICA with 60-79% stenosis.  The patient denies symptoms of TIA, amaurosis, or stroke.  He denies weakness on one side of his body, aphasia, or amaurosis.  He also has a history of right LE DVT and wears compression to control the daily edema.    He is medically managed on ASA and Statin daily.  He says he retired in 2000 from the city of [place]. 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   Cirrhosis of liver (HCC)    Clotting disorder (HCC)    Colon polyps    CVA (cerebral vascular accident) (HCC) ~ 2000   "minor stroke", denies residual on 09/27/2015   DVT (deep venous thrombosis) (HCC) 03/20/2013   RIGHT LEG AND LEFT LEG   DVT (deep venous thrombosis) (HCC) 02/2013   RLE   Esophageal varices (HCC)    Essential hypertension, benign 03/20/2013   History of blood transfusion 1998   "blood clot began bleeding"   Hyperlipidemia    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   Leukopenia 08/11/2012   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HCC) 1996   Mitral regurgitation    Pancytopenia, acquired (HCC) 09/17/2016   Due to non alcoholic cirhosis   Splenomegaly    Type II diabetes mellitus (HCC) dx'd in the 1990s    Past Surgical History:  Procedure Laterality Date   BIOPSY  10/14/2017   Procedure: BIOPSY;  Surgeon: Asencion Blacksmith, MD;  Location: [facility];  Service: Endoscopy;;   CATARACT EXTRACTION W/ INTRAOCULAR LENS IMPLANT Right 06/26/2012   CATARACT EXTRACTION W/ INTRAOCULAR LENS IMPLANT Left 07/27/2012   COLONOSCOPY      ESOPHAGOGASTRODUODENOSCOPY (EGD) WITH PROPOFOL  N/A 09/03/2016   Procedure: ESOPHAGOGASTRODUODENOSCOPY (EGD) WITH PROPOFOL ;  Surgeon: Asencion Blacksmith, MD;  Location: [facility];  Service: Endoscopy;  Laterality: N/A;   ESOPHAGOGASTRODUODENOSCOPY (EGD) WITH PROPOFOL  N/A 10/14/2017   Procedure: ESOPHAGOGASTRODUODENOSCOPY (EGD) WITH PROPOFOL ;  Surgeon: Asencion Blacksmith, MD;  Location: [facility];  Service: Endoscopy;  Laterality: N/A;     Social History Social History   Tobacco Use   Smoking status: Never   Smokeless tobacco: Never  Vaping Use   Vaping status: Never Used  Substance Use Topics   Alcohol use: No    Alcohol/week: 0.0 standard drinks of alcohol   Drug use: No    Family History Family History  Problem Relation Age of Onset   Heart disease Mother        After age 87   Hypertension Mother    Alzheimer's disease Father    Dementia Father    Cancer Sister        unknown type   Diabetes Sister    Deep vein thrombosis Sister        Varicose vein   Pancreatic cancer Son    Colon cancer Neg Hx    Stomach cancer Neg Hx    Esophageal cancer Neg Hx     Allergies  Allergies  Allergen Reactions   Pravastatin  Sodium Other (See Comments)     Current Outpatient Medications  Medication Sig Dispense Refill   aspirin  EC 81 MG tablet Take 81 mg by mouth daily.     atorvastatin  (LIPITOR) 40 MG tablet Take 40 mg by mouth every evening.      calcium  carbonate (TUMS - DOSED IN MG ELEMENTAL CALCIUM ) 500 MG chewable tablet Chew 1 tablet by mouth daily as needed for indigestion or heartburn.     ferrous sulfate  325 (65 FE) MG EC tablet Take 1 tablet (325 mg total) by mouth 2 (two) times daily with a meal. 60 tablet 3   furosemide  (LASIX ) 20 MG tablet Take 1 tablet (20 mg total) by mouth daily as needed. AS NEEDED FOR SWELLING 30 tablet 3   glimepiride (AMARYL) 2 MG tablet Take 2 mg by mouth every morning.     levothyroxine (SYNTHROID) 50 MCG tablet Take 50 mcg by mouth daily  before breakfast.     linagliptin  (TRADJENTA ) 5 MG TABS tablet Take 5 mg by mouth daily.     losartan  (COZAAR ) 50 MG tablet Take 50 mg by mouth daily.      Lutein 6 MG TABS Take 6 mg by mouth daily.     metFORMIN  (GLUCOPHAGE ) 1000 MG tablet Take 1,000 mg by mouth 2 (two) times daily with a meal.     ONETOUCH ULTRA test strip daily.     SYNTHROID 25 MCG tablet Take 25 mcg by mouth every morning.     No current facility-administered medications for this visit.    ROS:   General:  No weight loss, Fever, chills  HEENT: No recent headaches, no nasal bleeding, no visual changes, no sore throat  Neurologic: No dizziness, blackouts, seizures. No recent symptoms of stroke or mini- stroke. No recent episodes of slurred speech, or temporary blindness.  Cardiac: No recent episodes of chest pain/pressure, no shortness of breath at rest.  No shortness of breath with exertion.  Denies history of atrial fibrillation or irregular heartbeat  Vascular: No history of rest pain in feet.  No history of claudication.  No history of non-healing ulcer, No history of DVT   Pulmonary: No home oxygen, no productive cough, no hemoptysis,  No asthma or wheezing  Musculoskeletal:  [x ] Arthritis, [ ]  Low back pain,  [ ]  Joint pain  Hematologic:No history of hypercoagulable state.  No history of easy bleeding.  No history of anemia  Gastrointestinal: No hematochezia or melena,  No gastroesophageal reflux, no trouble swallowing  Urinary: [ ]  chronic Kidney disease, [ ]  on HD - [ ]  MWF or [ ]  TTHS, [ ]  Burning with urination, [ ]  Frequent urination, [ ]  Difficulty urinating;   Skin: No rashes  Psychological: No history of anxiety,  No history of depression   Physical Examination  Vitals:   07/11/23 1054 07/11/23 1058  BP: (!) 147/60 (!) 148/69  Pulse: (!) 56   Temp: 98.2 F (36.8 C)   TempSrc: Temporal   SpO2: 96%     There is no height or weight on file to calculate BMI.  General:  Alert and  oriented, no acute distress HEENT: Normal Neck: left  bruit Pulmonary: Clear to auscultation bilaterally Cardiac: Regular Rate and Rhythm without murmur Gastrointestinal: Soft, non-tender, non-distended, no mass, no scars Skin: No rash Extremity Pulses:  2+ radial,dorsalis pedis, pulses bilaterally Musculoskeletal: No deformity or edema  Neurologic: Upper and lower extremity motor 5/5 and symmetric  DATA:  Right Carotid Findings:  +----------+--------+--------+--------+------------------+--------+  PSV cm/sEDV cm/sStenosisPlaque DescriptionComments  +----------+--------+--------+--------+------------------+--------+  CCA Prox  78      9               heterogenous                +----------+--------+--------+--------+------------------+--------+  CCA Distal65      12              heterogenous                +----------+--------+--------+--------+------------------+--------+  ICA Prox  316     66      60-79%  heterogenous                +----------+--------+--------+--------+------------------+--------+  ICA Mid   140     33                                          +----------+--------+--------+--------+------------------+--------+  ICA Distal99      24                                          +----------+--------+--------+--------+------------------+--------+  ECA      133     11                                          +----------+--------+--------+--------+------------------+--------+    +---------+--------+--+--------+-+---------+  VertebralPSV cm/s53EDV cm/s9Antegrade  +---------+--------+--+--------+-+---------+      Left Carotid Findings:  +----------+--------+--------+--------+------------------+--------+           PSV cm/sEDV cm/sStenosisPlaque DescriptionComments  +----------+--------+--------+--------+------------------+--------+  CCA Prox  116     17              heterogenous                 +----------+--------+--------+--------+------------------+--------+  CCA Distal80      14              heterogenous                +----------+--------+--------+--------+------------------+--------+  ICA Prox  144     41      40-59%  heterogenous                +----------+--------+--------+--------+------------------+--------+  ICA Mid   102     20                                          +----------+--------+--------+--------+------------------+--------+  ICA Distal139     36                                          +----------+--------+--------+--------+------------------+--------+  ECA      161     14                                          +----------+--------+--------+--------+------------------+--------+   +----------+--------+--------+--------+-------------------+  PSV cm/sEDV cm/sDescribeArm Pressure (mmHG)  +----------+--------+--------+--------+-------------------+  Subclavian119    0                                    +----------+--------+--------+--------+-------------------+   +---------+--------+--+--------+-+---------+  VertebralPSV cm/s50EDV cm/s7Antegrade  +---------+--------+--+--------+-+---------+      Summary:  Right Carotid: Velocities in the right ICA are consistent with a 60-79%                 stenosis.   Left Carotid: Velocities in the left ICA are consistent with a 40-59%  stenosis.   Vertebrals: Bilateral vertebral arteries demonstrate antegrade flow.    ASSESSMENT/PLAN: Carotid stenosis asymptomatic. He has remained asymptomatic since his last visit.  His wife is with him today.   The carotid duplex is basically unchanged.  Right ICA 60-79% and left ICA 40-59 % stenosis.  He has no weakness, speech changes, or visual changes on exam.  He has easily palpable pedal pulses.    He will f/u in 6 months for repeat duplex.  If he has > 80 % stenosis on duplex he will get a CTA to confirm.   No indication for surgical intervention based on today's visit.        Rocky Cipro PA-C [facility] Office: [phone]  MD in clinic [place]

## 2023-07-16 ENCOUNTER — Other Ambulatory Visit: Payer: Self-pay | Admitting: Vascular Surgery

## 2023-07-16 DIAGNOSIS — I6523 Occlusion and stenosis of bilateral carotid arteries: Secondary | ICD-10-CM

## 2023-07-29 DIAGNOSIS — E039 Hypothyroidism, unspecified: Secondary | ICD-10-CM | POA: Diagnosis not present

## 2023-07-29 DIAGNOSIS — E1165 Type 2 diabetes mellitus with hyperglycemia: Secondary | ICD-10-CM | POA: Diagnosis not present

## 2023-07-29 LAB — LAB REPORT - SCANNED
A1c: 7.3
Albumin, Urine POC: 95.9
Creatinine, POC: 90 mg/dL
EGFR: 39
Microalb Creat Ratio: 106
TSH: 4.03 (ref 0.41–5.90)

## 2023-08-05 DIAGNOSIS — K7460 Unspecified cirrhosis of liver: Secondary | ICD-10-CM | POA: Diagnosis not present

## 2023-08-05 DIAGNOSIS — E7800 Pure hypercholesterolemia, unspecified: Secondary | ICD-10-CM | POA: Diagnosis not present

## 2023-08-05 DIAGNOSIS — E1159 Type 2 diabetes mellitus with other circulatory complications: Secondary | ICD-10-CM | POA: Diagnosis not present

## 2023-08-05 DIAGNOSIS — I10 Essential (primary) hypertension: Secondary | ICD-10-CM | POA: Diagnosis not present

## 2023-08-05 DIAGNOSIS — Z0000 Encounter for general adult medical examination without abnormal findings: Secondary | ICD-10-CM | POA: Diagnosis not present

## 2023-08-05 DIAGNOSIS — D693 Immune thrombocytopenic purpura: Secondary | ICD-10-CM | POA: Diagnosis not present

## 2023-08-05 DIAGNOSIS — I6523 Occlusion and stenosis of bilateral carotid arteries: Secondary | ICD-10-CM | POA: Diagnosis not present

## 2023-08-05 DIAGNOSIS — E039 Hypothyroidism, unspecified: Secondary | ICD-10-CM | POA: Diagnosis not present

## 2023-08-05 DIAGNOSIS — D61818 Other pancytopenia: Secondary | ICD-10-CM | POA: Diagnosis not present

## 2023-08-19 DIAGNOSIS — E039 Hypothyroidism, unspecified: Secondary | ICD-10-CM | POA: Diagnosis not present

## 2023-08-19 DIAGNOSIS — E1165 Type 2 diabetes mellitus with hyperglycemia: Secondary | ICD-10-CM | POA: Diagnosis not present

## 2023-08-19 DIAGNOSIS — I10 Essential (primary) hypertension: Secondary | ICD-10-CM | POA: Diagnosis not present

## 2023-08-19 DIAGNOSIS — E119 Type 2 diabetes mellitus without complications: Secondary | ICD-10-CM | POA: Diagnosis not present

## 2023-08-19 DIAGNOSIS — E7800 Pure hypercholesterolemia, unspecified: Secondary | ICD-10-CM | POA: Diagnosis not present

## 2023-09-24 ENCOUNTER — Emergency Department (HOSPITAL_COMMUNITY)

## 2023-09-24 ENCOUNTER — Other Ambulatory Visit: Payer: Self-pay

## 2023-09-24 ENCOUNTER — Emergency Department (HOSPITAL_COMMUNITY)
Admission: EM | Admit: 2023-09-24 | Discharge: 2023-09-24 | Disposition: A | Discharge status: Home / Self Care | Attending: Emergency Medicine | Admitting: Emergency Medicine

## 2023-09-24 DIAGNOSIS — H8110 Benign paroxysmal vertigo, unspecified ear: Secondary | ICD-10-CM | POA: Diagnosis not present

## 2023-09-24 DIAGNOSIS — Z7982 Long term (current) use of aspirin: Secondary | ICD-10-CM | POA: Insufficient documentation

## 2023-09-24 DIAGNOSIS — R9082 White matter disease, unspecified: Secondary | ICD-10-CM | POA: Diagnosis not present

## 2023-09-24 DIAGNOSIS — R1110 Vomiting, unspecified: Secondary | ICD-10-CM | POA: Diagnosis not present

## 2023-09-24 DIAGNOSIS — R739 Hyperglycemia, unspecified: Secondary | ICD-10-CM | POA: Diagnosis not present

## 2023-09-24 DIAGNOSIS — G9389 Other specified disorders of brain: Secondary | ICD-10-CM | POA: Diagnosis not present

## 2023-09-24 DIAGNOSIS — E119 Type 2 diabetes mellitus without complications: Secondary | ICD-10-CM | POA: Insufficient documentation

## 2023-09-24 DIAGNOSIS — I10 Essential (primary) hypertension: Secondary | ICD-10-CM | POA: Diagnosis not present

## 2023-09-24 DIAGNOSIS — Z471 Aftercare following joint replacement surgery: Secondary | ICD-10-CM | POA: Diagnosis not present

## 2023-09-24 DIAGNOSIS — Z7984 Long term (current) use of oral hypoglycemic drugs: Secondary | ICD-10-CM | POA: Insufficient documentation

## 2023-09-24 DIAGNOSIS — R42 Dizziness and giddiness: Secondary | ICD-10-CM | POA: Diagnosis not present

## 2023-09-24 DIAGNOSIS — R29818 Other symptoms and signs involving the nervous system: Secondary | ICD-10-CM | POA: Diagnosis not present

## 2023-09-24 LAB — COMPREHENSIVE METABOLIC PANEL WITH GFR
ALT: 23 U/L (ref 0–44)
AST: 25 U/L (ref 15–41)
Albumin: 3.1 g/dL — ABNORMAL LOW (ref 3.5–5.0)
Alkaline Phosphatase: 53 U/L (ref 38–126)
Anion gap: 10 (ref 5–15)
BUN: 33 mg/dL — ABNORMAL HIGH (ref 8–23)
CO2: 18 mmol/L — ABNORMAL LOW (ref 22–32)
Calcium: 8.9 mg/dL (ref 8.9–10.3)
Chloride: 111 mmol/L (ref 98–111)
Creatinine, Ser: 1.29 mg/dL — ABNORMAL HIGH (ref 0.61–1.24)
GFR, Estimated: 54 mL/min — ABNORMAL LOW (ref >60)
Glucose, Bld: 298 mg/dL — ABNORMAL HIGH (ref 70–99)
Potassium: 4.2 mmol/L (ref 3.5–5.1)
Sodium: 139 mmol/L (ref 135–145)
Total Bilirubin: 0.9 mg/dL (ref 0.0–1.2)
Total Protein: 6 g/dL — ABNORMAL LOW (ref 6.5–8.1)

## 2023-09-24 LAB — CBG MONITORING, ED
Glucose-Capillary: 256 mg/dL — ABNORMAL HIGH (ref 70–99)
Glucose-Capillary: 147 mg/dL — ABNORMAL HIGH (ref 70–99)

## 2023-09-24 LAB — I-STAT CHEM 8, ED
BUN: 31 mg/dL — ABNORMAL HIGH (ref 8–23)
Calcium, Ion: 1.16 mmol/L (ref 1.15–1.40)
Chloride: 115 mmol/L — ABNORMAL HIGH (ref 98–111)
Creatinine, Ser: 1.4 mg/dL — ABNORMAL HIGH (ref 0.61–1.24)
Glucose, Bld: 290 mg/dL — ABNORMAL HIGH (ref 70–99)
HCT: 27 % — ABNORMAL LOW (ref 39.0–52.0)
Hemoglobin: 9.2 g/dL — ABNORMAL LOW (ref 13.0–17.0)
Potassium: 5 mmol/L (ref 3.5–5.1)
Sodium: 143 mmol/L (ref 135–145)
TCO2: 18 mmol/L — ABNORMAL LOW (ref 22–32)

## 2023-09-24 LAB — CBC
HCT: 30.7 % — ABNORMAL LOW (ref 39.0–52.0)
Hemoglobin: 9.7 g/dL — ABNORMAL LOW (ref 13.0–17.0)
MCH: 32.8 pg (ref 26.0–34.0)
MCHC: 31.6 g/dL (ref 30.0–36.0)
MCV: 103.7 fL — ABNORMAL HIGH (ref 80.0–100.0)
Platelets: 30 K/uL — ABNORMAL LOW (ref 150–400)
RBC: 2.96 MIL/uL — ABNORMAL LOW (ref 4.22–5.81)
RDW: 15.7 % — ABNORMAL HIGH (ref 11.5–15.5)
WBC: 3.9 K/uL — ABNORMAL LOW (ref 4.0–10.5)
nRBC: 0 % (ref 0.0–0.2)

## 2023-09-24 LAB — BETA-HYDROXYBUTYRIC ACID: Beta-Hydroxybutyric Acid: 0.25 mmol/L (ref 0.05–0.27)

## 2023-09-24 MED ORDER — SODIUM CHLORIDE 0.9 % IV BOLUS
1000.0000 mL | Freq: Once | INTRAVENOUS | Status: AC
  Administered 2023-09-24: 1000 mL via INTRAVENOUS

## 2023-09-24 MED ORDER — ONDANSETRON HCL 4 MG/2ML IJ SOLN
4.0000 mg | Freq: Once | INTRAMUSCULAR | Status: AC
  Administered 2023-09-24: 4 mg via INTRAVENOUS
  Filled 2023-09-24: qty 2

## 2023-09-24 MED ORDER — MECLIZINE HCL 25 MG PO TABS
12.5000 mg | ORAL_TABLET | Freq: Once | ORAL | Status: AC
  Administered 2023-09-24: 12.5 mg via ORAL
  Filled 2023-09-24: qty 1

## 2023-09-24 MED ORDER — MECLIZINE HCL 12.5 MG PO TABS: 12.5000 mg | ORAL_TABLET | Freq: Three times a day (TID) | ORAL | 0 refills | Status: AC | PRN

## 2023-09-24 MED ORDER — ONDANSETRON 4 MG PO TBDP: 4.0000 mg | ORAL_TABLET | Freq: Three times a day (TID) | ORAL | 0 refills | Status: AC | PRN

## 2023-09-24 MED ORDER — FLUTICASONE PROPIONATE 50 MCG/ACT NA SUSP: 1.0000 | Freq: Every day | NASAL | 0 refills | Status: AC

## 2023-09-24 NOTE — ED Triage Notes (Signed)
 Pt reports high blood sugar this morning, 360 and normal is in mid-high 100s. Denies fever, urinary sx, abd pain. Emesis this morning. Intermittent dizziness

## 2023-09-24 NOTE — ED Provider Notes (Signed)
 [place] [facility] AT [facility] Provider Note   CSN: [id number] Arrival date & time: 09/24/23  1239     Patient presents with: Hyperglycemia and Emesis   [name] is a 83 y.o. male.   Pt is a 83 yo male with pmhx significant for DM, HLD, anemia, hypothyroidism, carotid artery occlusion, ITP, HTN, MR, DVT, CVA, cirrhosis of the liver, and arthritis.  Pt said he woke up fine, ate breakfast, then developed severe dizziness.  He vomited several times.  BS was elevated at 360 this am which is not normal for him.  Pt said they dizziness has improved.  He denies any weakness in his arms or legs.        Prior to Admission medications   Medication Sig Start Date End Date Taking? Authorizing Provider  fluticasone  (FLONASE ) 50 MCG/ACT nasal spray Place 1 spray into both nostrils daily. 09/24/23  Yes Dean Clarity, MD  meclizine  (ANTIVERT ) 12.5 MG tablet Take 1 tablet (12.5 mg total) by mouth 3 (three) times daily as needed for dizziness. 09/24/23  Yes Dean Clarity, MD  ondansetron  (ZOFRAN -ODT) 4 MG disintegrating tablet Take 1 tablet (4 mg total) by mouth every 8 (eight) hours as needed. 09/24/23  Yes Dean Clarity, MD  aspirin  EC 81 MG tablet Take 81 mg by mouth daily.    [provider]  atorvastatin  (LIPITOR) 40 MG tablet Take 40 mg by mouth every evening.     [provider]  calcium  carbonate (TUMS - DOSED IN MG ELEMENTAL CALCIUM ) 500 MG chewable tablet Chew 1 tablet by mouth daily as needed for indigestion or heartburn.    [provider]  ferrous sulfate  325 (65 FE) MG EC tablet Take 1 tablet (325 mg total) by mouth 2 (two) times daily with a meal. 07/07/15   Aneita Gwendlyn DASEN, MD  furosemide  (LASIX ) 20 MG tablet Take 1 tablet (20 mg total) by mouth daily as needed. AS NEEDED FOR SWELLING 09/20/22   O'Neal, Darryle Ned, MD  glimepiride (AMARYL) 2 MG tablet Take 2 mg by mouth every morning. 01/24/20   [provider]   levothyroxine (SYNTHROID) 50 MCG tablet Take 50 mcg by mouth daily before breakfast.    [provider]  linagliptin  (TRADJENTA ) 5 MG TABS tablet Take 5 mg by mouth daily.    [provider]  losartan  (COZAAR ) 50 MG tablet Take 50 mg by mouth daily.  08/07/15   [provider]  Lutein 6 MG TABS Take 6 mg by mouth daily.    [provider]  metFORMIN  (GLUCOPHAGE ) 1000 MG tablet Take 1,000 mg by mouth 2 (two) times daily with a meal.    [provider]  [facility] ULTRA test strip daily. 11/18/19   [provider]  SYNTHROID 25 MCG tablet Take 25 mcg by mouth every morning.    [provider]    Allergies: Pravastatin sodium    Review of Systems  Gastrointestinal:  Positive for nausea and vomiting.  Neurological:  Positive for dizziness.  All other systems reviewed and are negative.   Updated Vital Signs BP (!) 147/57   Pulse (!) 59   Temp 98.3 F (36.8 C) (Oral)   Resp 16   SpO2 100%   Physical Exam Vitals and nursing note reviewed.  Constitutional:      Appearance: Normal appearance.  HENT:     Head: Normocephalic and atraumatic.     Right Ear: External ear normal.  Left Ear: External ear normal.     Nose: Nose normal.     Mouth/Throat:     Mouth: Mucous membranes are dry.  Eyes:     Extraocular Movements: Extraocular movements intact.     Conjunctiva/sclera: Conjunctivae normal.     Pupils: Pupils are equal, round, and reactive to light.  Cardiovascular:     Rate and Rhythm: Regular rhythm. Bradycardia present.     Pulses: Normal pulses.     Heart sounds: Normal heart sounds.  Pulmonary:     Effort: Pulmonary effort is normal.     Breath sounds: Normal breath sounds.  Abdominal:     General: Abdomen is flat. Bowel sounds are normal.     Palpations: Abdomen is soft.  Musculoskeletal:        General: Normal range of motion.     Cervical back: Normal range of motion and neck supple.  Skin:    General:  Skin is warm.     Capillary Refill: Capillary refill takes less than 2 seconds.  Neurological:     General: No focal deficit present.     Mental Status: He is alert and oriented to person, place, and time.  Psychiatric:        Mood and Affect: Mood normal.        Behavior: Behavior normal.     (all labs ordered are listed, but only abnormal results are displayed) Labs Reviewed  CBC - Abnormal; Notable for the following components:      Result Value   WBC 3.9 (*)    RBC 2.96 (*)    Hemoglobin 9.7 (*)    HCT 30.7 (*)    MCV 103.7 (*)    RDW 15.7 (*)    Platelets 30 (*)    All other components within normal limits  COMPREHENSIVE METABOLIC PANEL WITH GFR - Abnormal; Notable for the following components:   CO2 18 (*)    Glucose, Bld 298 (*)    BUN 33 (*)    Creatinine, Ser 1.29 (*)    Total Protein 6.0 (*)    Albumin 3.1 (*)    GFR, Estimated 54 (*)    All other components within normal limits  CBG MONITORING, ED - Abnormal; Notable for the following components:   Glucose-Capillary 256 (*)    All other components within normal limits  I-STAT CHEM 8, ED - Abnormal; Notable for the following components:   Chloride 115 (*)    BUN 31 (*)    Creatinine, Ser 1.40 (*)    Glucose, Bld 290 (*)    TCO2 18 (*)    Hemoglobin 9.2 (*)    HCT 27.0 (*)    All other components within normal limits  BETA-HYDROXYBUTYRIC ACID  URINALYSIS, ROUTINE W REFLEX MICROSCOPIC  CBG MONITORING, ED    EKG: EKG Interpretation Date/Time:  Wednesday September 24 2023 15:49:32 EDT Ventricular Rate:  68 PR Interval:  157 QRS Duration:  102 QT Interval:  431 QTC Calculation: 459 R Axis:   55  Text Interpretation: Sinus rhythm No significant change since last tracing Confirmed by Dean Clarity [phone]) on 09/24/2023 4:25:56 PM  Radiology: MR BRAIN WO CONTRAST Result Date: 09/24/2023 EXAM: MRI BRAIN WITHOUT CONTRAST 09/24/2023 07:34:55 PM TECHNIQUE: Multiplanar multisequence MRI of the head/brain was  performed without the administration of intravenous contrast. COMPARISON: 11/08/2004 CLINICAL HISTORY: Neuro deficit, acute, stroke suspected. Pt reports high blood sugar this morning, 360 and normal is in mid-high 100s. Denies fever, urinary sx, abd  pain. Emesis this morning. Intermittent dizziness. FINDINGS: BRAIN AND VENTRICLES: No acute infarct. No intracranial hemorrhage. No mass. No midline shift. No hydrocephalus. Chronic encephalomalacia changes again demonstrated within the left occipital lobe. There is mild periventricular white matter disease. The sella is unremarkable. Normal flow voids. ORBITS: The patient is status post bilateral lens replacement. No acute abnormality. SINUSES AND MASTOIDS: There is mucosal disease within the left maxillary sinus. No acute abnormality. BONES AND SOFT TISSUES: Normal marrow signal. No acute soft tissue abnormality. IMPRESSION: 1. No acute intracranial abnormality. 2. Chronic encephalomalacia changes in the left occipital lobe. 3. Mild periventricular white matter disease. Electronically signed by: evalene coho 09/24/2023 08:31 PM EDT RP Workstation: [id number]   DG Chest Portable 1 View Result Date: 09/24/2023 CLINICAL DATA:  Hyperglycemia. Intermittent dizziness. Vomited this morning. EXAM: PORTABLE CHEST 1 VIEW COMPARISON:  None Available. FINDINGS: The heart size and mediastinal contours are within normal limits. Both lungs are clear. The visualized skeletal structures are unremarkable. IMPRESSION: No active disease. Electronically Signed   By: Elspeth Bathe M.D.   On: 09/24/2023 16:13     Procedures   Medications Ordered in the ED  sodium chloride  0.9 % bolus 1,000 mL (1,000 mLs Intravenous New Bag/Given 09/24/23 1557)  ondansetron  (ZOFRAN ) injection 4 mg (4 mg Intravenous Given 09/24/23 1557)  meclizine  (ANTIVERT ) tablet 12.5 mg (12.5 mg Oral Given 09/24/23 1557)                                    Medical Decision Making Amount and/or Complexity of  Data Reviewed Labs: ordered. Radiology: ordered.  Risk Prescription drug management.   This patient presents to the ED for concern of dizziness, this involves an extensive number of treatment options, and is a complaint that carries with it a high risk of complications and morbidity.  The differential diagnosis includes CVA, BPV, anemia, electrolyte abn, dehydration, dka   Co morbidities that complicate the patient evaluation   DM, HLD, anemia, hypothyroidism, carotid artery occlusion, ITP, HTN, MR, DVT, CVA, cirrhosis of the liver, and arthritis   Additional history obtained:  Additional history obtained from epic chart review External records from outside source obtained and reviewed including wife   Lab Tests:  I Ordered, and personally interpreted labs.  The pertinent results include:  cbc with chronic pancytopenia (wbc 3.9, hgb 9.7, plt 30); cmp with glucose elevated at 298, bun 33 and cr 1.29 (stable); bhb neg   Imaging Studies ordered:  I ordered imaging studies including mri and cxr  I independently visualized and interpreted imaging which showed  CXR: No active disease.  MRI: No acute intracranial abnormality.  2. Chronic encephalomalacia changes in the left occipital lobe.  3. Mild periventricular white matter disease.    I agree with the radiologist interpretation   Cardiac Monitoring:  The patient was maintained on a cardiac monitor.  I personally viewed and interpreted the cardiac monitored which showed an underlying rhythm of: nsr   Medicines ordered and prescription drug management:  I ordered medication including ivfs/antivert   for sx  Reevaluation of the patient after these medicines showed that the patient improved I have reviewed the patients home medicines and have made adjustments as needed   Test Considered:  mri   Problem List / ED Course:  Vertigo:  pt is feeling much better.  He did not have a CVA.  He is stable for d/c.  Return  if worse.  F/u with pcp.   Reevaluation:  After the interventions noted above, I reevaluated the patient and found that they have :improved   Social Determinants of Health:  Lives at home   Dispostion:  After consideration of the diagnostic results and the patients response to treatment, I feel that the patent would benefit from discharge with outpatient f/u.       Final diagnoses:  Benign paroxysmal positional vertigo, unspecified laterality    ED Discharge Orders          Ordered    meclizine  (ANTIVERT ) 12.5 MG tablet  3 times daily PRN        09/24/23 2106    fluticasone  (FLONASE ) 50 MCG/ACT nasal spray  Daily        09/24/23 2106    ondansetron  (ZOFRAN -ODT) 4 MG disintegrating tablet  Every 8 hours PRN        09/24/23 2108               Clarissa Laird, MD 09/24/23 2110

## 2023-09-26 DIAGNOSIS — E1165 Type 2 diabetes mellitus with hyperglycemia: Secondary | ICD-10-CM | POA: Diagnosis not present

## 2023-09-26 DIAGNOSIS — E119 Type 2 diabetes mellitus without complications: Secondary | ICD-10-CM | POA: Diagnosis not present

## 2023-09-26 DIAGNOSIS — N1831 Chronic kidney disease, stage 3a: Secondary | ICD-10-CM | POA: Diagnosis not present

## 2023-10-02 DIAGNOSIS — E1165 Type 2 diabetes mellitus with hyperglycemia: Secondary | ICD-10-CM | POA: Diagnosis not present

## 2023-10-02 DIAGNOSIS — E039 Hypothyroidism, unspecified: Secondary | ICD-10-CM | POA: Diagnosis not present

## 2023-10-15 ENCOUNTER — Inpatient Hospital Stay: Payer: Medicare Other

## 2023-10-15 ENCOUNTER — Inpatient Hospital Stay: Payer: Medicare Other | Attending: Internal Medicine | Admitting: Internal Medicine

## 2023-10-15 VITALS — HR 66 | Temp 96.9°F | Resp 17 | Ht 70.0 in | Wt 165.3 lb

## 2023-10-15 DIAGNOSIS — D61818 Other pancytopenia: Secondary | ICD-10-CM

## 2023-10-15 DIAGNOSIS — D649 Anemia, unspecified: Secondary | ICD-10-CM | POA: Diagnosis not present

## 2023-10-15 DIAGNOSIS — K7460 Unspecified cirrhosis of liver: Secondary | ICD-10-CM | POA: Diagnosis not present

## 2023-10-15 DIAGNOSIS — D6959 Other secondary thrombocytopenia: Secondary | ICD-10-CM | POA: Insufficient documentation

## 2023-10-15 DIAGNOSIS — I8510 Secondary esophageal varices without bleeding: Secondary | ICD-10-CM | POA: Diagnosis not present

## 2023-10-15 LAB — CBC WITH DIFFERENTIAL (CANCER CENTER ONLY)
Abs Immature Granulocytes: 0.01 K/uL (ref 0.00–0.07)
Basophils Absolute: 0 K/uL (ref 0.0–0.1)
Basophils Relative: 0 %
Eosinophils Absolute: 0.4 K/uL (ref 0.0–0.5)
Eosinophils Relative: 9 %
HCT: 27.3 % — ABNORMAL LOW (ref 39.0–52.0)
Hemoglobin: 9.1 g/dL — ABNORMAL LOW (ref 13.0–17.0)
Immature Granulocytes: 0 %
Lymphocytes Relative: 24 %
Lymphs Abs: 1.1 K/uL (ref 0.7–4.0)
MCH: 32.5 pg (ref 26.0–34.0)
MCHC: 33.3 g/dL (ref 30.0–36.0)
MCV: 97.5 fL (ref 80.0–100.0)
Monocytes Absolute: 0.4 K/uL (ref 0.1–1.0)
Monocytes Relative: 9 %
Neutro Abs: 2.7 K/uL (ref 1.7–7.7)
Neutrophils Relative %: 58 %
Platelet Count: 38 K/uL — ABNORMAL LOW (ref 150–400)
RBC: 2.8 MIL/uL — ABNORMAL LOW (ref 4.22–5.81)
RDW: 15.9 % — ABNORMAL HIGH (ref 11.5–15.5)
WBC Count: 4.6 K/uL (ref 4.0–10.5)
nRBC: 0 % (ref 0.0–0.2)

## 2023-10-15 LAB — FERRITIN: Ferritin: 53 ng/mL (ref 24–336)

## 2023-10-15 LAB — IRON AND IRON BINDING CAPACITY (CC-WL,HP ONLY)
Iron: 75 ug/dL (ref 45–182)
Saturation Ratios: 23 % (ref 17.9–39.5)
TIBC: 333 ug/dL (ref 250–450)
UIBC: 258 ug/dL (ref 117–376)

## 2023-10-15 LAB — CMP (CANCER CENTER ONLY)
ALT: 20 U/L (ref 0–44)
AST: 25 U/L (ref 15–41)
Albumin: 3.4 g/dL — ABNORMAL LOW (ref 3.5–5.0)
Alkaline Phosphatase: 56 U/L (ref 38–126)
Anion gap: 6 (ref 5–15)
BUN: 21 mg/dL (ref 8–23)
CO2: 23 mmol/L (ref 22–32)
Calcium: 8.8 mg/dL — ABNORMAL LOW (ref 8.9–10.3)
Chloride: 115 mmol/L — ABNORMAL HIGH (ref 98–111)
Creatinine: 1.37 mg/dL — ABNORMAL HIGH (ref 0.61–1.24)
GFR, Estimated: 51 mL/min — ABNORMAL LOW (ref >60)
Glucose, Bld: 104 mg/dL — ABNORMAL HIGH (ref 70–99)
Potassium: 4.1 mmol/L (ref 3.5–5.1)
Sodium: 144 mmol/L (ref 135–145)
Total Bilirubin: 1.1 mg/dL (ref 0.0–1.2)
Total Protein: 5.7 g/dL — ABNORMAL LOW (ref 6.5–8.1)

## 2023-10-15 LAB — LACTATE DEHYDROGENASE: LDH: 274 U/L — ABNORMAL HIGH (ref 98–192)

## 2023-10-15 NOTE — Progress Notes (Signed)
 [facility] Telephone:([phone]) [phone]   Fax:([phone]  OFFICE PROGRESS NOTE  Joshua Ronal Czar, [name] 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 2) persistent anemia secondary to possible gastrointestinal blood loss and history of esophageal varices.  PRIOR THERAPY: None  CURRENT THERAPY: Observation  INTERVAL HISTORY: [name] 83 y.o. male returns to the clinic today for follow-up visit accompanied by his wife [name].Discussed the use of AI scribe software for clinical note transcription with the patient, who gave verbal consent to proceed.  History of Present Illness [name] is an 83 year old male with thrombocytopenia and anemia secondary to liver cirrhosis and gastrointestinal blood loss who presents for evaluation and repeat blood work. He is accompanied by his wife, [name].  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 His platelet count remains low, with a recent count of 38,000 per microliter, similar to previous counts. No significant bleeding or bruising is reported.  He also has persistent anemia secondary to gastrointestinal blood loss from esophageal varices. His hemoglobin level is currently 9.1 g/dL, which is consistent with previous measurements.  He experienced an episode of dizziness a couple of weeks ago at home, followed by nausea and vomiting, without hematemesis. During this episode, his blood sugar was elevated to 360 mg/dL. He monitors his blood sugar levels twice daily, once in the morning and once before dinner, noting that his levels are typically higher in the evening.     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  HTN   Cirrhosis of liver (HCC)    Clotting disorder (HCC)    Colon polyps    CVA (cerebral vascular accident) (HCC) ~ 2000   minor stroke, denies residual on 09/27/2015   DVT (deep venous thrombosis) (HCC) 03/20/2013   RIGHT LEG AND LEFT LEG   DVT (deep venous thrombosis) (HCC) 02/2013   RLE   Esophageal varices (HCC)    Essential hypertension, benign 03/20/2013   History of blood transfusion 1998   blood clot began bleeding   Hyperlipidemia    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  MEDICATIONS:  Current Outpatient Medications  Medication Sig Dispense Refill   aspirin  EC 81 MG tablet Take 81 mg by mouth daily.     atorvastatin  (LIPITOR) 40 MG tablet Take 40 mg by mouth every evening.      calcium  carbonate (TUMS - DOSED IN MG ELEMENTAL CALCIUM ) 500 MG chewable tablet Chew 1 tablet by mouth daily as needed for indigestion or heartburn.     ferrous sulfate  325 (65 FE) MG EC tablet Take 1 tablet (325 mg total) by mouth 2 (two) times daily with a meal. 60 tablet 3   fluticasone  (FLONASE ) 50 MCG/ACT nasal spray Place 1 spray into both nostrils daily. 1 g 0   furosemide  (LASIX ) 20 MG tablet Take 1 tablet (20 mg  total) by mouth daily as needed. AS NEEDED FOR SWELLING 30 tablet 3   glimepiride (AMARYL) 2 MG tablet Take 2 mg by mouth every morning.     linagliptin  (TRADJENTA ) 5 MG TABS tablet Take 5 mg by mouth daily.     losartan  (COZAAR ) 50 MG tablet Take 50 mg by mouth daily.      Lutein 6 MG TABS Take 6 mg by mouth daily.     meclizine  (ANTIVERT ) 12.5 MG tablet Take 1 tablet (12.5 mg total) by mouth 3 (three) times daily as needed for dizziness. 30 tablet 0   metFORMIN  (GLUCOPHAGE ) 1000 MG  tablet Take 1,000 mg by mouth 2 (two) times daily with a meal.     ondansetron  (ZOFRAN -ODT) 4 MG disintegrating tablet Take 1 tablet (4 mg total) by mouth every 8 (eight) hours as needed. 20 tablet 0   ONETOUCH ULTRA test strip daily.     SYNTHROID 25 MCG tablet Take 25 mcg by mouth every morning.     No current facility-administered medications for this visit.    SURGICAL HISTORY:  Past Surgical History:  Procedure Laterality Date   BIOPSY  10/14/2017   Procedure: BIOPSY;  Surgeon: Aneita Gwendlyn DASEN, MD;  Location: [facility];  Service: Endoscopy;;   CATARACT EXTRACTION W/ INTRAOCULAR LENS IMPLANT Right 06/26/2012   CATARACT EXTRACTION W/ INTRAOCULAR LENS IMPLANT Left 07/27/2012   COLONOSCOPY     ESOPHAGOGASTRODUODENOSCOPY (EGD) WITH PROPOFOL  N/A 09/03/2016   Procedure: ESOPHAGOGASTRODUODENOSCOPY (EGD) WITH PROPOFOL ;  Surgeon: Aneita Gwendlyn DASEN, MD;  Location: [facility];  Service: Endoscopy;  Laterality: N/A;   ESOPHAGOGASTRODUODENOSCOPY (EGD) WITH PROPOFOL  N/A 10/14/2017   Procedure: ESOPHAGOGASTRODUODENOSCOPY (EGD) WITH PROPOFOL ;  Surgeon: Aneita Gwendlyn DASEN, MD;  Location: [facility];  Service: Endoscopy;  Laterality: N/A;    REVIEW OF SYSTEMS:  A comprehensive review of systems was negative except for: Constitutional: positive for fatigue   PHYSICAL EXAMINATION: General appearance: alert, cooperative, fatigued, and no distress Head: Normocephalic, without obvious abnormality, atraumatic Neck: no adenopathy, no JVD, supple, symmetrical, trachea midline, and thyroid  not enlarged, symmetric, no tenderness/mass/nodules Lymph nodes: Cervical, supraclavicular, and axillary nodes normal. Resp: clear to auscultation bilaterally Back: symmetric, no curvature. ROM normal. No CVA tenderness. Cardio: regular rate and rhythm, S1, S2 normal, no murmur, click, rub or gallop GI: soft, non-tender; bowel sounds normal; no masses,  no organomegaly Extremities: extremities normal, atraumatic, no  cyanosis or edema  ECOG PERFORMANCE STATUS: 1 - Symptomatic but completely ambulatory  Pulse 66, temperature (!) 96.9 F (36.1 C), resp. rate 17, height 5' 10 (1.778 m), weight 165 lb 4.8 oz (75 kg), SpO2 100%.  LABORATORY DATA: Lab Results  Component Value Date   WBC 4.6 10/15/2023   HGB 9.1 (L) 10/15/2023   HCT 27.3 (L) 10/15/2023   MCV 97.5 10/15/2023   PLT 38 (L) 10/15/2023      Chemistry      Component Value Date/Time   NA 144 10/15/2023 1335   NA 144 09/17/2016 0918   NA 140 09/01/2012 0933   K 4.1 10/15/2023 1335   K 4.6 09/01/2012 0933   CL 115 (H) 10/15/2023 1335   CO2 23 10/15/2023 1335   CO2 24 09/01/2012 0933   BUN 21 10/15/2023 1335   BUN 18 09/17/2016 0918   BUN 16.2 09/01/2012 0933   CREATININE 1.37 (H) 10/15/2023 1335   CREATININE 1.1 09/01/2012 0933      Component Value Date/Time   CALCIUM  8.8 (L) 10/15/2023 1335   CALCIUM  9.2 09/01/2012  0933   ALKPHOS 56 10/15/2023 1335   ALKPHOS 55 09/01/2012 0933   AST 25 10/15/2023 1335   AST 27 09/01/2012 0933   ALT 20 10/15/2023 1335   ALT 28 09/01/2012 0933   BILITOT 1.1 10/15/2023 1335   BILITOT 0.91 09/01/2012 0933       RADIOGRAPHIC STUDIES: MR BRAIN WO CONTRAST Result Date: 09/24/2023 EXAM: MRI BRAIN WITHOUT CONTRAST 09/24/2023 07:34:55 PM TECHNIQUE: Multiplanar multisequence MRI of the head/brain was performed without the administration of intravenous contrast. COMPARISON: 11/08/2004 CLINICAL HISTORY: Neuro deficit, acute, stroke suspected. Pt reports high blood sugar this morning, 360 and normal is in mid-high 100s. Denies fever, urinary sx, abd pain. Emesis this morning. Intermittent dizziness. FINDINGS: BRAIN AND VENTRICLES: No acute infarct. No intracranial hemorrhage. No mass. No midline shift. No hydrocephalus. Chronic encephalomalacia changes again demonstrated within the left occipital lobe. There is mild periventricular white matter disease. The sella is unremarkable. Normal flow voids. ORBITS:  The patient is status post bilateral lens replacement. No acute abnormality. SINUSES AND MASTOIDS: There is mucosal disease within the left maxillary sinus. No acute abnormality. BONES AND SOFT TISSUES: Normal marrow signal. No acute soft tissue abnormality. IMPRESSION: 1. No acute intracranial abnormality. 2. Chronic encephalomalacia changes in the left occipital lobe. 3. Mild periventricular white matter disease. Electronically signed by: evalene coho 09/24/2023 08:31 PM EDT RP Workstation: [id number]   DG Chest Portable 1 View Result Date: 09/24/2023 CLINICAL DATA:  Hyperglycemia. Intermittent dizziness. Vomited this morning. EXAM: PORTABLE CHEST 1 VIEW COMPARISON:  None Available. FINDINGS: The heart size and mediastinal contours are within normal limits. Both lungs are clear. The visualized skeletal structures are unremarkable. IMPRESSION: No active disease. Electronically Signed   By: Elspeth Bathe M.D.   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 The patient is currently on observation and he is feeling fine with no concerning complaints. Assessment and Plan Assessment & Plan Thrombocytopenia secondary to liver cirrhosis Thrombocytopenia is consistent with previous measurements, with a platelet count of 38. No significant bleeding or bruising is present. The condition is chronic and secondary to liver cirrhosis, requiring no acute intervention. - Advise to report any significant bleeding or bruising.  Anemia secondary to gastrointestinal blood loss from esophageal varices Anemia persists with hemoglobin at 9.1, similar to previous levels, and is secondary to gastrointestinal blood loss from esophageal varices. No active bleeding is present, and no acute intervention is required. Iron supplementation may support hemoglobin levels. - Recommend  taking iron supplementation twice a week. He was advised to call immediately if he has any concerning symptoms in the interval. The patient voices understanding of current disease status and treatment options and is in agreement with the current care plan.  All questions were answered. The patient knows to call the clinic with any problems, questions or concerns. We can certainly see the patient much sooner if necessary.  The total time spent in the appointment was 20 minutes.  Disclaimer: This note was dictated with voice recognition software. Similar sounding words can inadvertently be transcribed and may not be corrected upon review.

## 2023-12-10 ENCOUNTER — Other Ambulatory Visit: Payer: Self-pay | Admitting: Nurse Practitioner

## 2023-12-10 DIAGNOSIS — K7469 Other cirrhosis of liver: Secondary | ICD-10-CM

## 2023-12-26 ENCOUNTER — Other Ambulatory Visit

## 2023-12-31 ENCOUNTER — Ambulatory Visit (INDEPENDENT_AMBULATORY_CARE_PROVIDER_SITE_OTHER): Admitting: Physician Assistant

## 2023-12-31 ENCOUNTER — Ambulatory Visit (HOSPITAL_COMMUNITY)
Admission: RE | Admit: 2023-12-31 | Discharge: 2023-12-31 | Disposition: A | Discharge status: Home / Self Care | Source: Ambulatory Visit | Attending: Surgery | Admitting: Surgery

## 2023-12-31 VITALS — BP 176/65 | HR 65 | Ht 70.0 in | Wt 161.1 lb

## 2023-12-31 DIAGNOSIS — I6523 Occlusion and stenosis of bilateral carotid arteries: Secondary | ICD-10-CM | POA: Diagnosis present

## 2023-12-31 NOTE — Progress Notes (Signed)
 Office Note     CC:  follow up Requesting Provider:  Clarice Nottingham, MD  HPI: [name] is a 83 y.o. ([birth year]) male who presents for surveillance of carotid artery stenosis.  He was last seen in the office 6 months ago.  He denies any diagnosis of CVA or TIA since that office visit.  He has not had any slurring speech, changes in vision, or one-sided weakness.  He is on daily aspirin  and statin.  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  He denies tobacco use.   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   Cirrhosis of liver (HCC)    Clotting disorder    Colon polyps    CVA (cerebral vascular accident) (HCC) ~ 2000   minor stroke, denies residual on 09/27/2015   DVT (deep venous thrombosis) (HCC) 03/20/2013   RIGHT LEG AND LEFT LEG   DVT (deep venous thrombosis) (HCC) 02/2013   RLE   Esophageal varices (HCC)    Essential hypertension, benign 03/20/2013   History of blood transfusion 1998   blood clot began bleeding   Hyperlipidemia    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   Leukopenia 08/11/2012   Mesenteric venous thrombosis 1996   Mitral regurgitation    Pancytopenia, acquired (HCC) 09/17/2016   Due to non alcoholic cirhosis   Splenomegaly    Type II diabetes mellitus (HCC) dx'd in the 1990s    Past Surgical History:  Procedure Laterality Date   BIOPSY  10/14/2017   Procedure: BIOPSY;  Surgeon: Aneita Gwendlyn DASEN, MD;  Location: [facility];  Service: Endoscopy;;   CATARACT EXTRACTION W/ INTRAOCULAR LENS IMPLANT Right 06/26/2012   CATARACT EXTRACTION W/ INTRAOCULAR LENS IMPLANT Left 07/27/2012   COLONOSCOPY     ESOPHAGOGASTRODUODENOSCOPY (EGD) WITH PROPOFOL  N/A 09/03/2016   Procedure: ESOPHAGOGASTRODUODENOSCOPY (EGD) WITH  PROPOFOL ;  Surgeon: Aneita Gwendlyn DASEN, MD;  Location: [facility];  Service: Endoscopy;  Laterality: N/A;   ESOPHAGOGASTRODUODENOSCOPY (EGD) WITH PROPOFOL  N/A 10/14/2017   Procedure: ESOPHAGOGASTRODUODENOSCOPY (EGD) WITH PROPOFOL ;  Surgeon: Aneita Gwendlyn DASEN, MD;  Location: [facility];  Service: Endoscopy;  Laterality: N/A;    Social History   Socioeconomic History   Marital status: Married    Spouse name: Not on file   Number of children: 1   Years of education: 12   Highest education level: Not on file  Occupational History   Occupation: retired   Occupation: Retail Buyer  Tobacco Use   Smoking status: Never   Smokeless tobacco: Never  Vaping Use   Vaping status: Never Used  Substance and Sexual Activity   Alcohol use: No    Alcohol/week: 0.0 standard drinks of alcohol   Drug use: No   Sexual activity: Never  Other Topics Concern   Not on file  Social History Narrative   Patient drinks 1 cup of coffee daily.   Patient is left handed.   Social Drivers of Corporate Investment Banker Strain: Not on file  Food Insecurity: Low Risk  (12/10/2023)   Received from [facility]   Hunger Vital Sign    Within the past 12 months, you worried that your food would run out before you got money to buy more: Never true  Within the past 12 months, the food you bought just didn't last and you didn't have money to get more. : Never true  Transportation Needs: No Transportation Needs (12/10/2023)   Received from [facility]    In the past 12 months, has lack of reliable transportation kept you from medical appointments, meetings, work or from getting things needed for daily living? : No  Physical Activity: Not on file  Stress: Not on file  Social Connections: Not on file  Intimate Partner Violence: Not on file    Family History  Problem Relation Age of Onset   Heart disease Mother        After age 73   Hypertension Mother    Alzheimer's disease Father     Dementia Father    Cancer Sister        unknown type   Diabetes Sister    Deep vein thrombosis Sister        Varicose vein   Pancreatic cancer Son    Colon cancer Neg Hx    Stomach cancer Neg Hx    Esophageal cancer Neg Hx     Current Outpatient Medications  Medication Sig Dispense Refill   aspirin  EC 81 MG tablet Take 81 mg by mouth daily.     atorvastatin  (LIPITOR) 40 MG tablet Take 40 mg by mouth every evening.      calcium  carbonate (TUMS - DOSED IN MG ELEMENTAL CALCIUM ) 500 MG chewable tablet Chew 1 tablet by mouth daily as needed for indigestion or heartburn.     ferrous sulfate  325 (65 FE) MG EC tablet Take 1 tablet (325 mg total) by mouth 2 (two) times daily with a meal. 60 tablet 3   fluticasone  (FLONASE ) 50 MCG/ACT nasal spray Place 1 spray into both nostrils daily. 1 g 0   furosemide  (LASIX ) 20 MG tablet Take 1 tablet (20 mg total) by mouth daily as needed. AS NEEDED FOR SWELLING 30 tablet 3   glimepiride (AMARYL) 2 MG tablet Take 2 mg by mouth every morning.     linagliptin  (TRADJENTA ) 5 MG TABS tablet Take 5 mg by mouth daily.     losartan  (COZAAR ) 50 MG tablet Take 50 mg by mouth daily.      Lutein 6 MG TABS Take 6 mg by mouth daily.     meclizine  (ANTIVERT ) 12.5 MG tablet Take 1 tablet (12.5 mg total) by mouth 3 (three) times daily as needed for dizziness. 30 tablet 0   metFORMIN  (GLUCOPHAGE ) 1000 MG tablet Take 1,000 mg by mouth 2 (two) times daily with a meal.     ondansetron  (ZOFRAN -ODT) 4 MG disintegrating tablet Take 1 tablet (4 mg total) by mouth every 8 (eight) hours as needed. 20 tablet 0   ONETOUCH ULTRA test strip daily.     SYNTHROID 25 MCG tablet Take 25 mcg by mouth every morning.     No current facility-administered medications for this visit.    Allergies  Allergen Reactions   Pravastatin Sodium Other (See Comments)     REVIEW OF SYSTEMS:  Negative unless noted in HPI [X]  denotes positive finding, [ ]  denotes negative finding Cardiac   Comments:  Chest pain or chest pressure:    Shortness of breath upon exertion:    Short of breath when lying flat:    Irregular heart rhythm:        Vascular    Pain in calf, thigh, or hip brought on by ambulation:    Pain  in feet at night that wakes you up from your sleep:     Blood clot in your veins:    Leg swelling:         Pulmonary    Oxygen at home:    Productive cough:     Wheezing:         Neurologic    Sudden weakness in arms or legs:     Sudden numbness in arms or legs:     Sudden onset of difficulty speaking or slurred speech:    Temporary loss of vision in one eye:     Problems with dizziness:         Gastrointestinal    Blood in stool:     Vomited blood:         Genitourinary    Burning when urinating:     Blood in urine:        Psychiatric    Major depression:         Hematologic    Bleeding problems:    Problems with blood clotting too easily:        Skin    Rashes or ulcers:        Constitutional    Fever or chills:      PHYSICAL EXAMINATION:  Vitals:   12/31/23 1050 12/31/23 1051  BP: (!) 158/92 (!) 176/65  Pulse: 67 65  TempSrc: Temporal   Weight: 161 lb 1.6 oz (73.1 kg)   Height: 5' 10 (1.778 m)     General:  WDWN in NAD; vital signs documented above Gait: Not observed HENT: WNL, normocephalic Pulmonary: normal non-labored breathing Cardiac: regular HR Abdomen: soft, NT, no masses Skin: without rashes Vascular Exam/Pulses: Symmetrical radial pulses Extremities: without ischemic changes, without Gangrene , without cellulitis; without open wounds;  Musculoskeletal: no muscle wasting or atrophy  Neurologic: A&O X 3; cranial nerves grossly intact Psychiatric:  The pt has Normal affect.   Non-Invasive Vascular Imaging:   Right ICA 60 to 79% stenosis; stable Left ICA 1 to 39% stenosis    ASSESSMENT/PLAN:: 83 y.o. male here for follow up for surveillance of carotid artery stenosis  Mr. Witman is an 83 year old male who  returns to clinic for surveillance of carotid artery stenosis.  Subjectively he has been doing well since last office visit.  Carotid duplex is unchanged with right ICA 60 to 79% stenosis.  These findings are stable.  No indication for revascularization of asymptomatic stenosis at this time.  We will repeat carotid duplex in 6 months.  He can continue his aspirin  and statin daily.  He knows to seek immediate medical attention should he experience any strokelike symptoms in the meantime.   Donnice Sender, PA-C [facility] [phone]  Clinic MD:   Sheree

## 2024-01-01 ENCOUNTER — Other Ambulatory Visit: Payer: Self-pay | Admitting: *Deleted

## 2024-01-01 DIAGNOSIS — I6523 Occlusion and stenosis of bilateral carotid arteries: Secondary | ICD-10-CM

## 2024-01-09 NOTE — Telephone Encounter (Signed)
 I spoke with the patient's wife, Gabriel. She stated the ultrasound appointment on 12/26/2023 was accidentally no showed due to confusion regarding appointments.  Patient had confused appointment dates for US  Abdomen and US  Carotid Doppler.  Patient's wife states they will call back to reschedule US  appointment after Thanksgiving. I provided the number to [facility] for them to schedule.

## 2024-01-16 ENCOUNTER — Emergency Department (HOSPITAL_COMMUNITY)
Admission: EM | Admit: 2024-01-16 | Discharge: 2024-01-16 | Disposition: A | Discharge status: Home / Self Care | Attending: Emergency Medicine | Admitting: Emergency Medicine

## 2024-01-16 ENCOUNTER — Other Ambulatory Visit: Payer: Self-pay

## 2024-01-16 DIAGNOSIS — R2231 Localized swelling, mass and lump, right upper limb: Secondary | ICD-10-CM | POA: Diagnosis present

## 2024-01-16 DIAGNOSIS — Z8673 Personal history of transient ischemic attack (TIA), and cerebral infarction without residual deficits: Secondary | ICD-10-CM | POA: Insufficient documentation

## 2024-01-16 DIAGNOSIS — L03113 Cellulitis of right upper limb: Secondary | ICD-10-CM | POA: Diagnosis not present

## 2024-01-16 DIAGNOSIS — Z79899 Other long term (current) drug therapy: Secondary | ICD-10-CM | POA: Diagnosis not present

## 2024-01-16 DIAGNOSIS — E1165 Type 2 diabetes mellitus with hyperglycemia: Secondary | ICD-10-CM | POA: Insufficient documentation

## 2024-01-16 DIAGNOSIS — Z7982 Long term (current) use of aspirin: Secondary | ICD-10-CM | POA: Insufficient documentation

## 2024-01-16 DIAGNOSIS — Z7984 Long term (current) use of oral hypoglycemic drugs: Secondary | ICD-10-CM | POA: Diagnosis not present

## 2024-01-16 DIAGNOSIS — I10 Essential (primary) hypertension: Secondary | ICD-10-CM | POA: Insufficient documentation

## 2024-01-16 LAB — CBC WITH DIFFERENTIAL/PLATELET
Abs Immature Granulocytes: 0.01 K/uL (ref 0.00–0.07)
Basophils Absolute: 0 K/uL (ref 0.0–0.1)
Basophils Relative: 1 %
Eosinophils Absolute: 0.2 K/uL (ref 0.0–0.5)
Eosinophils Relative: 5 %
HCT: 27.6 % — ABNORMAL LOW (ref 39.0–52.0)
Hemoglobin: 8.9 g/dL — ABNORMAL LOW (ref 13.0–17.0)
Immature Granulocytes: 0 %
Lymphocytes Relative: 22 %
Lymphs Abs: 0.9 K/uL (ref 0.7–4.0)
MCH: 32 pg (ref 26.0–34.0)
MCHC: 32.2 g/dL (ref 30.0–36.0)
MCV: 99.3 fL (ref 80.0–100.0)
Monocytes Absolute: 0.3 K/uL (ref 0.1–1.0)
Monocytes Relative: 7 %
Neutro Abs: 2.6 K/uL (ref 1.7–7.7)
Neutrophils Relative %: 65 %
Platelets: 46 K/uL — ABNORMAL LOW (ref 150–400)
RBC: 2.78 MIL/uL — ABNORMAL LOW (ref 4.22–5.81)
RDW: 15.9 % — ABNORMAL HIGH (ref 11.5–15.5)
Smear Review: NORMAL
WBC: 4 K/uL (ref 4.0–10.5)
nRBC: 0 % (ref 0.0–0.2)

## 2024-01-16 LAB — COMPREHENSIVE METABOLIC PANEL WITH GFR
ALT: 27 U/L (ref 0–44)
AST: 34 U/L (ref 15–41)
Albumin: 3.4 g/dL — ABNORMAL LOW (ref 3.5–5.0)
Alkaline Phosphatase: 80 U/L (ref 38–126)
Anion gap: 9 (ref 5–15)
BUN: 27 mg/dL — ABNORMAL HIGH (ref 8–23)
CO2: 20 mmol/L — ABNORMAL LOW (ref 22–32)
Calcium: 8.9 mg/dL (ref 8.9–10.3)
Chloride: 114 mmol/L — ABNORMAL HIGH (ref 98–111)
Creatinine, Ser: 1.43 mg/dL — ABNORMAL HIGH (ref 0.61–1.24)
GFR, Estimated: 48 mL/min — ABNORMAL LOW (ref >60)
Glucose, Bld: 160 mg/dL — ABNORMAL HIGH (ref 70–99)
Potassium: 4.4 mmol/L (ref 3.5–5.1)
Sodium: 143 mmol/L (ref 135–145)
Total Bilirubin: 0.7 mg/dL (ref 0.0–1.2)
Total Protein: 6 g/dL — ABNORMAL LOW (ref 6.5–8.1)

## 2024-01-16 MED ORDER — DOXYCYCLINE HYCLATE 100 MG PO CAPS: 100.0000 mg | ORAL_CAPSULE | Freq: Two times a day (BID) | ORAL | 0 refills | Status: AC

## 2024-01-16 MED ORDER — SODIUM CHLORIDE 0.9 % IV SOLN
2.0000 g | Freq: Once | INTRAVENOUS | Status: AC
  Administered 2024-01-16: 2 g via INTRAVENOUS
  Filled 2024-01-16: qty 20

## 2024-01-16 NOTE — Discharge Instructions (Addendum)
 Take antibiotics as directed.  You need to call and schedule to see the surgeon to have the area on your arm removed.  This area is concerning for a tumor.  If you have increased redness swelling or fever you should return to the emergency department for evaluation. Return if any problems

## 2024-01-16 NOTE — ED Triage Notes (Addendum)
 BIB spouse from home for R FA growth lesion, redness, heat and TTP. Some drainage noted. States, not sure if I was bitten by something. Reports working outside a lot. First noticed 3-4 weeks ago. Gradually progressively worse. Rates pain minimal, 2/10. States, R FA usually looks like L FA. H/o DM. Denies fever, cramping, NVD. Alert, NAD, calm, interactive, resps e/u, speaking in clear complete sentences.Skin W&D. Steady gait.

## 2024-01-16 NOTE — ED Provider Notes (Signed)
**Joshua Joshua**  Joshua Joshua   CSN: [id number] Arrival date & time: 01/16/24  9075     Patient presents with: Wound Infection   Joshua Joshua is a 83 y.o. male.   Pt complains of an infected swollen  infected area to his right arm.  Pt thinks he was bitten by something 3-4 weeks ago.  Pt reports area has began draining and has redness around the area.  Pt denies fever or chills.  Patient reports some pain around the area.  Patient has a past medical history of hypertension carotid stenosis DVT, diabetes pancytopenia, leukopenia, anemia and CVA.  Patient is currently followed by oncology.  The history is provided by the patient. No language interpreter was used.       Prior to Admission medications   Medication Sig Start Date End Date Taking? Authorizing Provider  aspirin  EC 81 MG tablet Take 81 mg by mouth daily.    [provider]  atorvastatin  (LIPITOR) 40 MG tablet Take 40 mg by mouth every evening.     [provider]  calcium  carbonate (TUMS - DOSED IN MG ELEMENTAL CALCIUM ) 500 MG chewable tablet Chew 1 tablet by mouth daily as needed for indigestion or heartburn.    [provider]  ferrous sulfate  325 (65 FE) MG EC tablet Take 1 tablet (325 mg total) by mouth 2 (two) times daily with a meal. 07/07/15   Aneita Gwendlyn DASEN, MD  fluticasone  (FLONASE ) 50 MCG/ACT nasal spray Place 1 spray into both nostrils daily. 09/24/23   Dean Clarity, MD  furosemide  (LASIX ) 20 MG tablet Take 1 tablet (20 mg total) by mouth daily as needed. AS NEEDED FOR SWELLING 09/20/22   O'Neal, Darryle Ned, MD  glimepiride (AMARYL) 2 MG tablet Take 2 mg by mouth every morning. 01/24/20   [provider]  linagliptin  (TRADJENTA ) 5 MG TABS tablet Take 5 mg by mouth daily.    [provider]  losartan  (COZAAR ) 50 MG tablet Take 50 mg by mouth daily.  08/07/15   [provider]  Lutein 6 MG TABS Take 6 mg by mouth  daily.    [provider]  meclizine  (ANTIVERT ) 12.5 MG tablet Take 1 tablet (12.5 mg total) by mouth 3 (three) times daily as needed for dizziness. 09/24/23   Dean Clarity, MD  metFORMIN  (GLUCOPHAGE ) 1000 MG tablet Take 1,000 mg by mouth 2 (two) times daily with a meal.    [provider]  ondansetron  (ZOFRAN -ODT) 4 MG disintegrating tablet Take 1 tablet (4 mg total) by mouth every 8 (eight) hours as needed. 09/24/23   Dean Clarity, MD  [facility] ULTRA test strip daily. 11/18/19   [provider]  SYNTHROID 25 MCG tablet Take 25 mcg by mouth every morning.    [provider]    Allergies: Pravastatin sodium    Review of Systems  All other systems reviewed and are negative.   Updated Vital Signs BP (!) 162/59 (BP Location: Left Arm)   Pulse 69   Temp 97.9 F (36.6 C) (Oral)   Resp 18   Ht 5' 10 (1.778 m)   Wt 73.5 kg   SpO2 100%   BMI 23.24 kg/m   Physical Exam Vitals and nursing Joshua reviewed.  Constitutional:      Appearance: Normal appearance. He is well-developed.  HENT:     Head: Normocephalic.  Cardiovascular:     Rate and Rhythm: Normal rate.  Pulmonary:  Effort: Pulmonary effort is normal.  Musculoskeletal:        General: Normal range of motion.     Comments: 5cm swollen area inner right arm. Erythema surrounding area  nv and ns intact   Skin:    General: Skin is warm.  Neurological:     General: No focal deficit present.     Mental Status: He is alert and oriented to person, place, and time.  Psychiatric:        Mood and Affect: Mood normal.     (all labs ordered are listed, but only abnormal results are displayed) Labs Reviewed  CBC WITH DIFFERENTIAL/PLATELET - Abnormal; Notable for the following components:      Result Value   RBC 2.78 (*)    Hemoglobin 8.9 (*)    HCT 27.6 (*)    RDW 15.9 (*)    Platelets 46 (*)    All other components within normal limits  COMPREHENSIVE METABOLIC PANEL WITH GFR - Abnormal;  Notable for the following components:   Chloride 114 (*)    CO2 20 (*)    Glucose, Bld 160 (*)    BUN 27 (*)    Creatinine, Ser 1.43 (*)    Total Protein 6.0 (*)    Albumin 3.4 (*)    GFR, Estimated 48 (*)    All other components within normal limits    EKG: None  Radiology: No results found.   Procedures   Medications Ordered in the ED  cefTRIAXone (ROCEPHIN) 2 g in sodium chloride  0.9 % 100 mL IVPB (2 g Intravenous New Bag/Given 01/16/24 1017)                                    Medical Decision Making Patient complains of a mass to his right arm.  He believes the area started 3 to 4 weeks ago  Amount and/or Complexity of Data Reviewed Independent Historian: spouse    Details: Patient is here with his wife who advised she did not noticed the lesion until Thanksgiving. Labs: ordered.    Details: Labs ordered reviewed and interpreted me.  Patient's hemoglobin is 8.9.  Platelets are 46.  Glucose is 160 BUN is 27.  Creatinine is 1.43 Discussion of management or test interpretation with external provider(s): I discussed the patient with Dr. Jeanmarie, he reviewed images and advised patient may need a skin flap.  He advised referral to plastic surgery I spoke to Dr. Lowery  plastic surgery she advised she will see the patient in the office this week.  Risk Prescription drug management. Risk Details: Patient is given a prescription for doxycycline .  He is given Rocephin 2 g IV here.  I counseled patient and wife at length about my concerns that this is a skin tumor.  He is advised this area needs to be seen and excised.  He understands that this could be a skin cancer..  He agrees to follow-up plan             Final diagnoses:  Cellulitis of right upper extremity    ED Discharge Orders          Ordered    doxycycline  (VIBRAMYCIN ) 100 MG capsule  2 times daily        01/16/24 1224               Honore Wipperfurth K, PA-C 01/16/24 1419    Zammit,  Fairy, MD 01/16/24 1544

## 2024-01-20 ENCOUNTER — Ambulatory Visit: Admitting: Plastic Surgery

## 2024-01-20 VITALS — BP 171/75 | HR 87 | Wt 163.0 lb

## 2024-01-20 DIAGNOSIS — L989 Disorder of the skin and subcutaneous tissue, unspecified: Secondary | ICD-10-CM | POA: Diagnosis not present

## 2024-01-20 NOTE — Progress Notes (Signed)
 Patient ID: [name], male    DOB: [birth year], 83 y.o.   MRN: [id number]   Chief Complaint  Patient presents with   Advice Only   Skin Problem    The patient is an 83 year old gentleman here with his wife for evaluation of a skin lesion.  The patient has had a lesion on his right arm for several months.  He did not tell his wife until recently.  He was having some challenges with it bleeding so went to have it looked at.  It does look like it is either a basal cell or squamous cell carcinoma.  It is 4 x 7 cm in size.  It is irregular and has ulcerations. His past medical history is positive for pancytopenia, DVT, arthritis, carotid artery occlusion, stroke, splenomegaly and diabetes.  His past surgical history includes cataract extraction and a colonoscopy.       Review of Systems  Constitutional: Negative.   HENT: Negative.    Eyes: Negative.   Respiratory: Negative.    Cardiovascular: Negative.   Gastrointestinal: Negative.   Endocrine: Negative.   Genitourinary: Negative.   Musculoskeletal: Negative.     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   Cirrhosis of liver (HCC)    Clotting disorder    Colon polyps    CVA (cerebral vascular accident) (HCC) ~ 2000   minor stroke, denies residual on 09/27/2015   DVT (deep venous thrombosis) (HCC) 03/20/2013   RIGHT LEG AND LEFT LEG   DVT (deep venous thrombosis) (HCC) 02/2013   RLE   Esophageal varices (HCC)    Essential hypertension, benign 03/20/2013   History of blood transfusion 1998   blood clot began bleeding   Hyperlipidemia    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   Leukopenia 08/11/2012   Mesenteric venous thrombosis 1996   Mitral regurgitation    Pancytopenia, acquired (HCC) 09/17/2016   Due  to non alcoholic cirhosis   Splenomegaly    Type II diabetes mellitus (HCC) dx'd in the 1990s    Past Surgical History:  Procedure Laterality Date   BIOPSY  10/14/2017   Procedure: BIOPSY;  Surgeon: Aneita Gwendlyn DASEN, MD;  Location: [facility];  Service: Endoscopy;;   CATARACT EXTRACTION W/ INTRAOCULAR LENS IMPLANT Right 06/26/2012   CATARACT EXTRACTION W/ INTRAOCULAR LENS IMPLANT Left 07/27/2012   COLONOSCOPY     ESOPHAGOGASTRODUODENOSCOPY (EGD) WITH PROPOFOL  N/A 09/03/2016   Procedure: ESOPHAGOGASTRODUODENOSCOPY (EGD) WITH PROPOFOL ;  Surgeon: Aneita Gwendlyn DASEN, MD;  Location: [facility];  Service: Endoscopy;  Laterality: N/A;   ESOPHAGOGASTRODUODENOSCOPY (EGD) WITH PROPOFOL  N/A 10/14/2017   Procedure: ESOPHAGOGASTRODUODENOSCOPY (EGD) WITH PROPOFOL ;  Surgeon: Aneita Gwendlyn DASEN, MD;  Location: [facility];  Service: Endoscopy;  Laterality: N/A;      Current Outpatient Medications:    aspirin  EC 81 MG tablet, Take 81 mg by mouth daily., Disp: , Rfl:    atorvastatin  (LIPITOR) 40 MG tablet, Take 40 mg by mouth every evening. , Disp: , Rfl:    calcium  carbonate (TUMS - DOSED IN MG ELEMENTAL CALCIUM ) 500 MG chewable tablet, Chew 1 tablet by mouth daily as needed for indigestion or heartburn., Disp: , Rfl:    doxycycline  (VIBRAMYCIN ) 100 MG capsule, Take 1 capsule (100 mg total)  by mouth 2 (two) times daily., Disp: 20 capsule, Rfl: 0   ferrous sulfate  325 (65 FE) MG EC tablet, Take 1 tablet (325 mg total) by mouth 2 (two) times daily with a meal., Disp: 60 tablet, Rfl: 3   fluticasone  (FLONASE ) 50 MCG/ACT nasal spray, Place 1 spray into both nostrils daily., Disp: 1 g, Rfl: 0   furosemide  (LASIX ) 20 MG tablet, Take 1 tablet (20 mg total) by mouth daily as needed. AS NEEDED FOR SWELLING, Disp: 30 tablet, Rfl: 3   glimepiride (AMARYL) 2 MG tablet, Take 2 mg by mouth every morning., Disp: , Rfl:    linagliptin  (TRADJENTA ) 5 MG TABS tablet, Take 5 mg by mouth daily., Disp: , Rfl:    losartan   (COZAAR ) 50 MG tablet, Take 50 mg by mouth daily. , Disp: , Rfl:    Lutein 6 MG TABS, Take 6 mg by mouth daily., Disp: , Rfl:    meclizine  (ANTIVERT ) 12.5 MG tablet, Take 1 tablet (12.5 mg total) by mouth 3 (three) times daily as needed for dizziness., Disp: 30 tablet, Rfl: 0   metFORMIN  (GLUCOPHAGE ) 1000 MG tablet, Take 1,000 mg by mouth 2 (two) times daily with a meal., Disp: , Rfl:    ondansetron  (ZOFRAN -ODT) 4 MG disintegrating tablet, Take 1 tablet (4 mg total) by mouth every 8 (eight) hours as needed., Disp: 20 tablet, Rfl: 0   ONETOUCH ULTRA test strip, daily., Disp: , Rfl:    SYNTHROID 25 MCG tablet, Take 25 mcg by mouth every morning., Disp: , Rfl:    Objective:   Vitals:   01/20/24 1402  BP: (!) 171/75  Pulse: 87  SpO2: 100%    Physical Exam Vitals reviewed.  HENT:     Head: Atraumatic.  Cardiovascular:     Rate and Rhythm: Normal rate.     Pulses: Normal pulses.  Pulmonary:     Effort: Pulmonary effort is normal.  Skin:    General: Skin is warm.     Capillary Refill: Capillary refill takes less than 2 seconds.  Neurological:     Mental Status: He is alert and oriented to person, place, and time.  Psychiatric:        Mood and Affect: Mood normal.        Behavior: Behavior normal.        Thought Content: Thought content normal.        Judgment: Judgment normal.     Assessment & Plan:  Changing skin lesion  Plan for excision of changing skin lesion which is likely a skin cancer in the right arm with myriad placement.  We will probably wait until the margins are negative before final closure.  Estefana RAMAN Dovber Ernest, DO

## 2024-01-21 ENCOUNTER — Telehealth: Payer: Self-pay | Admitting: Plastic Surgery

## 2024-01-21 NOTE — Telephone Encounter (Signed)
 Patients wife called and said that Dr Lowery recommended patient pick up Vashe wound wash and she said she tried [facility], [facility] and [facility] and none of them carried. I asked patient if she had tried online like through [employer] or [employer] and she said she hasn't but will give it a try and let us  know if she has any issues.

## 2024-01-23 ENCOUNTER — Encounter (HOSPITAL_COMMUNITY): Payer: Self-pay | Admitting: Plastic Surgery

## 2024-01-23 ENCOUNTER — Other Ambulatory Visit: Payer: Self-pay

## 2024-01-23 NOTE — Progress Notes (Signed)
 Anesthesia Chart Review: Same day workup  83 yo male with pertinent hx including hypertension, hyperlipidemia, mild-mod AS (MG 17 mmHg, AVA 1.18 cm2 by echo 09/2022), carotid stenosis (60-70% RICA and 1-39% LICA by duplex 12/2023), NIDDM2 (A1c 7.3 on 07/29/23), DVT, CVA 2005, CKD 3, cirrhosis secondary to [facility].   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 Most recent EGD: 2019 with grade 1 varices and mild portal hypertensive gastropathy. No history of hospitalizations related to liver disease. No history of ascites. Last seen in followup 12/10/23, MELD 3.0 = 12, CPT A with portal hypertension. Liver synthetic function well preserved.  Previously diagnosed with ITP, however, when seen by hematologist Dr. Freddie 11/21/2015, it was felt that his pancytopenia was secondary to cirrhosis with portal hypertension and associated splenomegaly.  He was no longer felt to have chronic ITP.  CMP and CBC 01/08/2024 reviewed, creatinine mildly elevated 1.43 consistent with history of CKD, moderate chronic pancytopenia notable for hemoglobin 8.9 and platelets 46k.  Patient will need day of surgery evaluation.  EKG 09/24/23: Sinus rhythm. Rate 68.  Carotid duplex 12/31/23: Summary:  Right Carotid: Velocities in the right ICA are consistent with a 60-79% stenosis.   Left Carotid: Velocities in the left ICA are consistent with a 1-39% stenosis.   TTE 10/10/22: 1. Left ventricular ejection fraction, by estimation, is 60 to 65%. Left  ventricular ejection fraction by 3D volume is 60 %. The left ventricle has  normal function. The left ventricle has no regional wall motion  abnormalities. Left ventricular diastolic   function could not be evaluated. The average left ventricular global  longitudinal strain is -20.2 %. The global longitudinal strain is normal.   2. Right ventricular  systolic function is normal. The right ventricular  size is normal. There is normal pulmonary artery systolic pressure.   3. The mitral valve is normal in structure. Mild to moderate mitral valve  regurgitation. No evidence of mitral stenosis. Moderate mitral annular  calcification.   4. The aortic valve is calcified. There is moderate calcification of the  aortic valve. There is moderate thickening of the aortic valve. Aortic  valve regurgitation is not visualized. Mild to moderate aortic valve  stenosis. Aortic valve area, by VTI  measures 1.18 cm. Aortic valve mean gradient measures 17.0 mmHg. Aortic  valve Vmax measures 2.76 m/s.   5. The inferior vena cava is normal in size with greater than 50%  respiratory variability, suggesting right atrial pressure of 3 mmHg.     Lynwood Geofm RIGGERS [facility]/Anesthesiology Phone [phone] 01/23/2024 10:37 AM

## 2024-01-23 NOTE — Anesthesia Preprocedure Evaluation (Addendum)
 Anesthesia Evaluation  Patient identified by MRN, date of birth, ID band Patient awake    Reviewed: Allergy & Precautions, NPO status , Patient's Chart, lab work & pertinent test results  History of Anesthesia Complications Negative for: history of anesthetic complications  Airway Mallampati: II  TM Distance: >3 FB Neck ROM: Full    Dental  (+) Chipped   Pulmonary neg pulmonary ROS, neg sleep apnea, neg COPD, Patient abstained from smoking.Not current smoker   Pulmonary exam normal breath sounds clear to auscultation       Cardiovascular Exercise Tolerance: Good METShypertension, Pt. on medications + Peripheral Vascular Disease and + DVT  (-) CAD and (-) Past MI (-) dysrhythmias + Valvular Problems/Murmurs MR and AS  Rhythm:Regular Rate:Normal + Systolic murmurs   EKG 09/24/23: Sinus rhythm. Rate 68.   Carotid duplex 12/31/23: Summary:  Right Carotid: Velocities in the right ICA are consistent with a 60-79% stenosis.   Left Carotid: Velocities in the left ICA are consistent with a 1-39% stenosis.    TTE 10/10/22: 1. Left ventricular ejection fraction, by estimation, is 60 to 65%. Left  ventricular ejection fraction by 3D volume is 60 %. The left ventricle has  normal function. The left ventricle has no regional wall motion  abnormalities. Left ventricular diastolic   function could not be evaluated. The average left ventricular global  longitudinal strain is -20.2 %. The global longitudinal strain is normal.   2. Right ventricular systolic function is normal. The right ventricular  size is normal. There is normal pulmonary artery systolic pressure.   3. The mitral valve is normal in structure. Mild to moderate mitral valve  regurgitation. No evidence of mitral stenosis. Moderate mitral annular  calcification.   4. The aortic valve is calcified. There is moderate calcification of the  aortic valve. There is moderate  thickening of the aortic valve. Aortic  valve regurgitation is not visualized. Mild to moderate aortic valve  stenosis. Aortic valve area, by VTI  measures 1.18 cm. Aortic valve mean gradient measures 17.0 mmHg. Aortic  valve Vmax measures 2.76 m/s.   5. The inferior vena cava is normal in size with greater than 50%  respiratory variability, suggesting right atrial pressure of 3 mmHg.    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 Most recent EGD: 2019 with grade 1 varices and mild portal hypertensive gastropathy. No history of hospitalizations related to liver disease. No history of ascites. Last seen in followup 12/10/23, MELD 3.0 = 12, CPT A with portal hypertension. Liver synthetic function well preserved.   Previously diagnosed with ITP, however, when seen by hematologist Dr. Freddie 11/21/2015, it was felt that his pancytopenia was secondary to cirrhosis with  portal hypertension and associated splenomegaly.  He was no longer felt to have chronic ITP.   CMP and CBC 01/08/2024 reviewed, creatinine mildly elevated 1.43 consistent with history of CKD, moderate chronic pancytopenia notable  for hemoglobin 8.9 and platelets 46k.      Reproductive/Obstetrics negative OB ROS                              Anesthesia Physical Anesthesia Plan  ASA: 3  Anesthesia Plan: General   Post-op Pain Management: Minimal or no pain anticipated   Induction: Intravenous  PONV Risk Score and Plan: 0 and 2 and Ondansetron , Dexamethasone  and Treatment may vary due to age or medical condition  Airway Management Planned: LMA  Additional Equipment: None  Intra-op Plan:   Post-operative Plan: Extubation in OR  Informed Consent: I have reviewed the patients History and Physical, chart, labs and discussed the procedure including the risks, benefits and alternatives for the proposed anesthesia with the patient or authorized representative who has indicated his/her understanding and acceptance.     Dental advisory given  Plan Discussed with: CRNA and Surgeon  Anesthesia Plan Comments: (Discussed risks of anesthesia with patient, including PONV, sore throat, lip/dental/eye damage, post operative cognitive dysfunction. Rare risks discussed as well, such as cardiorespiratory and neurological sequelae, and allergic reactions. Discussed the role of CRNA in patient's perioperative care. Patient understands.)         Anesthesia Quick Evaluation

## 2024-01-23 NOTE — Progress Notes (Signed)
 Spoke with pt's wife to arrive on Mon at 0900. Stop clear liquids at 08.

## 2024-01-23 NOTE — Progress Notes (Signed)
 PCP - Ronal Leeroy Marie, FNP Cardiologist - denies  PPM/ICD - denies   Chest x-ray - 09/24/23 EKG - 09/24/23 Stress Test - denies ECHO - 10/10/22 Cardiac Cath - denies  CPAP - denies  Fasting Blood Sugar - 100-110 Checks Blood Sugar once/day  Blood Thinner Instructions: n/a Aspirin  Instructions: pt took last dose on 11/28  ERAS Protcol - clears until 1140  COVID TEST- n/a  Anesthesia review: yes, hx CVA  Patient verbally denies any shortness of breath, fever, cough and chest pain during phone call      Questions were answered. Patient verbalized understanding of instructions.

## 2024-01-23 NOTE — Pre-Procedure Instructions (Signed)
 -------------  SDW INSTRUCTIONS given:  Your procedure is scheduled on 12/8.  Report to [facility] Main Entrance A at 12:10 P.M., and check in at the Admitting office.  Any questions or running late day of surgery: call [phone]    Remember:  Do not eat after midnight the night before your surgery  You may drink clear liquids until 11:40 AM the morning of your surgery.   Clear liquids allowed are: Water, Non-Citrus Juices (without pulp), Carbonated Beverages, Clear Tea, Black Coffee Only, and Gatorade    Take these medicines the morning of surgery with A SIP OF WATER  doxycycline     May take these medicines IF NEEDED: Flonase  Meclizine  Zofran   Follow your surgeon's instructions on when to stop Aspirin .  If no instructions were given by your surgeon then you will need to call the office to get those instructions.    As of today, STOP taking any Aleve, Naproxen, Ibuprofen, Motrin, Advil, Goody's, BC's, all herbal medications, fish oil, and all vitamins.  WHAT DO I DO ABOUT MY DIABETES MEDICATION?   Do not take Metformin , Glimepiride, Linagliptin  the morning of surgery.    HOW TO MANAGE YOUR DIABETES BEFORE AND AFTER SURGERY  Why is it important to control my blood sugar before and after surgery? Improving blood sugar levels before and after surgery helps healing and can limit problems. A way of improving blood sugar control is eating a healthy diet by:  Eating less sugar and carbohydrates  Increasing activity/exercise  Talking with your doctor about reaching your blood sugar goals High blood sugars (greater than 180 mg/dL) can raise your risk of infections and slow your recovery, so you will need to focus on controlling your diabetes during the weeks before surgery. Make sure that the doctor who takes care of your diabetes knows about your planned surgery including the date and location.  How do I manage my blood sugar before surgery? Check your blood sugar at least  4 times a day, starting 2 days before surgery, to make sure that the level is not too high or low.  Check your blood sugar the morning of your surgery when you wake up and every 2 hours until you get to the Short Stay unit.  If your blood sugar is less than 70 mg/dL, you will need to treat for low blood sugar: Do not take insulin . Treat a low blood sugar (less than 70 mg/dL) with  cup of clear juice (cranberry or apple), 4 glucose tablets, OR glucose gel. Recheck blood sugar in 15 minutes after treatment (to make sure it is greater than 70 mg/dL). If your blood sugar is not greater than 70 mg/dL on recheck, call [phone] for further instructions. Report your blood sugar to the short stay nurse when you get to Short Stay.  If you are admitted to the hospital after surgery: Your blood sugar will be checked by the staff and you will probably be given insulin  after surgery (instead of oral diabetes medicines) to make sure you have good blood sugar levels. The goal for blood sugar control after surgery is 80-180 mg/dL.   Do NOT Smoke (Tobacco/Vaping) 24 hours prior to your procedure  If you use a CPAP at night, you may bring all equipment for your overnight stay.     You will be asked to remove any contacts, glasses, piercing's, hearing aid's, dentures/partials prior to surgery. Please bring cases for these items if needed.     Patients discharged the day of  surgery will not be allowed to drive home, and someone needs to stay with them for 24 hours.  SURGICAL WAITING ROOM VISITATION Patients may have no more than 2 support people in the waiting area - these visitors may rotate.   Pre-op nurse will coordinate an appropriate time for 1 ADULT support person, who may not rotate, to accompany patient in pre-op.  Children under the age of 80 must have an adult with them who is not the patient and must remain in the main waiting area with an adult.  If the patient needs to stay at the hospital  during part of their recovery, the visitor guidelines for inpatient rooms apply.  Please refer to the [facility] website for the visitor guidelines for any additional information.   Special instructions:   [place]   Please follow these instructions carefully.   Shower the NIGHT BEFORE SURGERY and the MORNING OF SURGERY with DIAL Soap.   Pat yourself dry with a CLEAN TOWEL.  Wear CLEAN PAJAMAS to bed the night before surgery  Place CLEAN SHEETS on your bed the night of your first shower and DO NOT SLEEP WITH PETS.   Additional instructions for the day of surgery: DO NOT APPLY any lotions, deodorants, cologne, or perfumes.   Do not wear jewelry or makeup Do not wear nail polish, gel polish, artificial nails, or any other type of covering on natural nails (fingers and toes) Do not bring valuables to the hospital. [facility] is not responsible for valuables/personal belongings. Put on clean/comfortable clothes.  Please brush your teeth.  Ask your nurse before applying any prescription medications to the skin.

## 2024-01-26 ENCOUNTER — Ambulatory Visit (HOSPITAL_COMMUNITY): Payer: Self-pay | Admitting: Physician Assistant

## 2024-01-26 ENCOUNTER — Encounter (HOSPITAL_COMMUNITY): Payer: Self-pay | Admitting: Plastic Surgery

## 2024-01-26 ENCOUNTER — Ambulatory Visit (HOSPITAL_COMMUNITY)
Admission: RE | Admit: 2024-01-26 | Discharge: 2024-01-26 | Disposition: A | Attending: Plastic Surgery | Admitting: Plastic Surgery

## 2024-01-26 ENCOUNTER — Encounter: Admission: RE | Disposition: A | Payer: Self-pay | Attending: Plastic Surgery

## 2024-01-26 DIAGNOSIS — N183 Chronic kidney disease, stage 3 unspecified: Secondary | ICD-10-CM | POA: Diagnosis not present

## 2024-01-26 DIAGNOSIS — Z7984 Long term (current) use of oral hypoglycemic drugs: Secondary | ICD-10-CM | POA: Diagnosis not present

## 2024-01-26 DIAGNOSIS — E785 Hyperlipidemia, unspecified: Secondary | ICD-10-CM | POA: Diagnosis not present

## 2024-01-26 DIAGNOSIS — E119 Type 2 diabetes mellitus without complications: Secondary | ICD-10-CM | POA: Diagnosis not present

## 2024-01-26 DIAGNOSIS — C44622 Squamous cell carcinoma of skin of right upper limb, including shoulder: Secondary | ICD-10-CM | POA: Diagnosis not present

## 2024-01-26 DIAGNOSIS — K766 Portal hypertension: Secondary | ICD-10-CM | POA: Diagnosis not present

## 2024-01-26 DIAGNOSIS — Z8673 Personal history of transient ischemic attack (TIA), and cerebral infarction without residual deficits: Secondary | ICD-10-CM | POA: Diagnosis not present

## 2024-01-26 DIAGNOSIS — E1122 Type 2 diabetes mellitus with diabetic chronic kidney disease: Secondary | ICD-10-CM | POA: Diagnosis not present

## 2024-01-26 DIAGNOSIS — K7460 Unspecified cirrhosis of liver: Secondary | ICD-10-CM | POA: Diagnosis not present

## 2024-01-26 DIAGNOSIS — Z86718 Personal history of other venous thrombosis and embolism: Secondary | ICD-10-CM | POA: Diagnosis not present

## 2024-01-26 DIAGNOSIS — L989 Disorder of the skin and subcutaneous tissue, unspecified: Secondary | ICD-10-CM | POA: Diagnosis present

## 2024-01-26 DIAGNOSIS — D693 Immune thrombocytopenic purpura: Secondary | ICD-10-CM | POA: Diagnosis not present

## 2024-01-26 DIAGNOSIS — I129 Hypertensive chronic kidney disease with stage 1 through stage 4 chronic kidney disease, or unspecified chronic kidney disease: Secondary | ICD-10-CM | POA: Diagnosis not present

## 2024-01-26 DIAGNOSIS — I10 Essential (primary) hypertension: Secondary | ICD-10-CM | POA: Diagnosis not present

## 2024-01-26 HISTORY — PX: APPLICATION, SKIN SUBSTITUTE: SHX7530

## 2024-01-26 HISTORY — PX: LESION REMOVAL: SHX5196

## 2024-01-26 LAB — GLUCOSE, CAPILLARY
Glucose-Capillary: 128 mg/dL — ABNORMAL HIGH (ref 70–99)
Glucose-Capillary: 114 mg/dL — ABNORMAL HIGH (ref 70–99)
Glucose-Capillary: 127 mg/dL — ABNORMAL HIGH (ref 70–99)

## 2024-01-26 SURGERY — WIDE EXCISION, LESION, UPPER EXTREMITY
Anesthesia: General | Site: Arm Lower | Laterality: Right

## 2024-01-26 MED ORDER — OXYCODONE HCL 5 MG PO TABS: 5.0000 mg | ORAL_TABLET | ORAL | Status: DC | PRN

## 2024-01-26 MED ORDER — SODIUM CHLORIDE 0.9% FLUSH: 3.0000 mL | INTRAVENOUS | Status: DC | PRN

## 2024-01-26 MED ORDER — ACETAMINOPHEN 650 MG RE SUPP: 650.0000 mg | RECTAL | Status: DC | PRN

## 2024-01-26 MED ORDER — SODIUM CHLORIDE 0.9% FLUSH: 3.0000 mL | Freq: Two times a day (BID) | INTRAVENOUS | Status: DC

## 2024-01-26 MED ORDER — ACETAMINOPHEN 10 MG/ML IV SOLN: 500.0000 mg | Freq: Once | INTRAVENOUS | Status: DC | PRN

## 2024-01-26 MED ORDER — LIDOCAINE 2% (20 MG/ML) 5 ML SYRINGE
INTRAMUSCULAR | Status: DC | PRN
  Administered 2024-01-26: 80 mg via INTRAVENOUS

## 2024-01-26 MED ORDER — ONDANSETRON HCL 4 MG/2ML IJ SOLN
INTRAMUSCULAR | Status: AC
  Filled 2024-01-26: qty 2

## 2024-01-26 MED ORDER — CHLORHEXIDINE GLUCONATE CLOTH 2 % EX PADS: 6.0000 | MEDICATED_PAD | Freq: Once | CUTANEOUS | Status: DC

## 2024-01-26 MED ORDER — ORAL CARE MOUTH RINSE: 15.0000 mL | Freq: Once | OROMUCOSAL | Status: AC

## 2024-01-26 MED ORDER — OXYCODONE HCL 5 MG PO TABS: 5.0000 mg | ORAL_TABLET | Freq: Once | ORAL | Status: DC | PRN

## 2024-01-26 MED ORDER — SODIUM CHLORIDE 0.9 % IV SOLN: 250.0000 mL | INTRAVENOUS | Status: DC | PRN

## 2024-01-26 MED ORDER — DEXAMETHASONE SODIUM PHOSPHATE 4 MG/ML IJ SOLN
INTRAMUSCULAR | Status: DC | PRN
  Administered 2024-01-26: 4 mg via INTRAVENOUS

## 2024-01-26 MED ORDER — BUPIVACAINE-EPINEPHRINE (PF) 0.25% -1:200000 IJ SOLN
INTRAMUSCULAR | Status: AC
  Filled 2024-01-26: qty 30

## 2024-01-26 MED ORDER — LACTATED RINGERS IV SOLN: INTRAVENOUS | Status: DC | PRN

## 2024-01-26 MED ORDER — CHLORHEXIDINE GLUCONATE 0.12 % MT SOLN
15.0000 mL | Freq: Once | OROMUCOSAL | Status: AC
  Administered 2024-01-26: 15 mL via OROMUCOSAL
  Filled 2024-01-26: qty 15

## 2024-01-26 MED ORDER — LACTATED RINGERS IV SOLN: INTRAVENOUS | Status: DC

## 2024-01-26 MED ORDER — FENTANYL CITRATE (PF) 100 MCG/2ML IJ SOLN
INTRAMUSCULAR | Status: DC | PRN
  Administered 2024-01-26: 25 ug via INTRAVENOUS

## 2024-01-26 MED ORDER — FENTANYL CITRATE (PF) 100 MCG/2ML IJ SOLN: 25.0000 ug | INTRAMUSCULAR | Status: DC | PRN

## 2024-01-26 MED ORDER — VASHE WOUND IRRIGATION OPTIME
TOPICAL | Status: DC | PRN
  Administered 2024-01-26: 34 [oz_av]

## 2024-01-26 MED ORDER — DROPERIDOL 2.5 MG/ML IJ SOLN: 0.6250 mg | Freq: Once | INTRAMUSCULAR | Status: DC | PRN

## 2024-01-26 MED ORDER — FENTANYL CITRATE (PF) 100 MCG/2ML IJ SOLN
INTRAMUSCULAR | Status: AC
  Filled 2024-01-26: qty 2

## 2024-01-26 MED ORDER — CEFAZOLIN SODIUM-DEXTROSE 2-4 GM/100ML-% IV SOLN
2.0000 g | INTRAVENOUS | Status: AC
  Administered 2024-01-26: 2 g via INTRAVENOUS
  Filled 2024-01-26: qty 100

## 2024-01-26 MED ORDER — ACETAMINOPHEN 325 MG PO TABS: 650.0000 mg | ORAL_TABLET | ORAL | Status: DC | PRN

## 2024-01-26 MED ORDER — INSULIN ASPART 100 UNIT/ML IJ SOLN: 0.0000 [IU] | INTRAMUSCULAR | Status: DC | PRN

## 2024-01-26 MED ORDER — LIDOCAINE 2% (20 MG/ML) 5 ML SYRINGE
INTRAMUSCULAR | Status: AC
  Filled 2024-01-26: qty 5

## 2024-01-26 MED ORDER — OXYCODONE HCL 5 MG/5ML PO SOLN: 5.0000 mg | Freq: Once | ORAL | Status: DC | PRN

## 2024-01-26 MED ORDER — ONDANSETRON HCL 4 MG/2ML IJ SOLN
INTRAMUSCULAR | Status: DC | PRN
  Administered 2024-01-26: 4 mg via INTRAVENOUS

## 2024-01-26 MED ORDER — PROPOFOL 10 MG/ML IV BOLUS
INTRAVENOUS | Status: AC
  Filled 2024-01-26: qty 20

## 2024-01-26 MED ORDER — PHENYLEPHRINE HCL-NACL 20-0.9 MG/250ML-% IV SOLN
INTRAVENOUS | Status: DC | PRN
  Administered 2024-01-26: 30 ug/min via INTRAVENOUS

## 2024-01-26 MED ORDER — PROPOFOL 10 MG/ML IV BOLUS
INTRAVENOUS | Status: DC | PRN
  Administered 2024-01-26: 10 mg via INTRAVENOUS
  Administered 2024-01-26: 60 mg via INTRAVENOUS

## 2024-01-26 MED ORDER — BUPIVACAINE-EPINEPHRINE 0.25% -1:200000 IJ SOLN
INTRAMUSCULAR | Status: DC | PRN
  Administered 2024-01-26: 10 mL

## 2024-01-26 SURGICAL SUPPLY — 38 items
BAG COUNTER SPONGE SURGICOUNT (BAG) ×2 IMPLANT
BLADE SURG 15 STRL LF DISP TIS (BLADE) IMPLANT
BNDG ELASTIC 4INX 5YD STR LF (GAUZE/BANDAGES/DRESSINGS) IMPLANT
BNDG GAUZE DERMACEA FLUFF 4 (GAUZE/BANDAGES/DRESSINGS) IMPLANT
CANISTER SUCTION 3000ML PPV (SUCTIONS) IMPLANT
CLEANSER WND VASHE INSTL 34OZ (WOUND CARE) IMPLANT
CNTNR URN SCR LID CUP LEK RST (MISCELLANEOUS) ×2 IMPLANT
COVER SURGICAL LIGHT HANDLE (MISCELLANEOUS) ×2 IMPLANT
DRAPE LAPAROTOMY 100X72 PEDS (DRAPES) IMPLANT
DRSG CUTIMED SORBACT 7X9 (GAUZE/BANDAGES/DRESSINGS) IMPLANT
DRSG EMULSION OIL 3X3 NADH (GAUZE/BANDAGES/DRESSINGS) IMPLANT
ELECT CAUTERY BLADE 6.4 (BLADE) IMPLANT
ELECT NDL TIP 2.8 STRL (NEEDLE) IMPLANT
ELECTRODE REM PT RTRN 9FT ADLT (ELECTROSURGICAL) ×2 IMPLANT
GAUZE 4X4 16PLY ~~LOC~~+RFID DBL (SPONGE) ×2 IMPLANT
GAUZE SPONGE 4X4 12PLY STRL (GAUZE/BANDAGES/DRESSINGS) IMPLANT
GLOVE BIO SURGEON STRL SZ 6.5 (GLOVE) ×2 IMPLANT
GOWN STRL REUS W/ TWL LRG LVL3 (GOWN DISPOSABLE) ×4 IMPLANT
GRAFT MATRIX 2 LAYER 5X5 (Graft) IMPLANT
KIT BASIN OR (CUSTOM PROCEDURE TRAY) ×2 IMPLANT
KIT TURNOVER KIT B (KITS) ×2 IMPLANT
MARKER SKIN DUAL TIP RULER LAB (MISCELLANEOUS) IMPLANT
NDL 27GX1/2 REG BEVEL ECLIP (NEEDLE) IMPLANT
NDL HYPO 25GX1X1/2 BEV (NEEDLE) ×2 IMPLANT
PACK SRG BSC III STRL LF ECLPS (CUSTOM PROCEDURE TRAY) ×2 IMPLANT
PAD ARMBOARD POSITIONER FOAM (MISCELLANEOUS) ×2 IMPLANT
PENCIL BUTTON HOLSTER BLD 10FT (ELECTRODE) ×2 IMPLANT
SOLN 0.9% NACL POUR BTL 1000ML (IV SOLUTION) ×2 IMPLANT
SURGILUBE 2OZ TUBE FLIPTOP (MISCELLANEOUS) IMPLANT
SUT SILK 4 0 PS 2 (SUTURE) IMPLANT
SUT VIC AB 4-0 SH 27XANBCTRL (SUTURE) IMPLANT
SYR BULB EAR ULCER 3OZ GRN STR (SYRINGE) IMPLANT
SYR BULB IRRIG 60ML STRL (SYRINGE) IMPLANT
SYR CONTROL 10ML LL (SYRINGE) IMPLANT
TOWEL GREEN STERILE (TOWEL DISPOSABLE) ×2 IMPLANT
TOWEL GREEN STERILE FF (TOWEL DISPOSABLE) ×2 IMPLANT
TUBE CONNECTING 12X1/4 (SUCTIONS) IMPLANT
YANKAUER SUCT BULB TIP NO VENT (SUCTIONS) IMPLANT

## 2024-01-26 NOTE — Anesthesia Procedure Notes (Signed)
 Procedure Name: LMA Insertion Date/Time: 01/26/2024 10:29 AM  Performed by: Deeann Eva BROCKS, CRNAPre-anesthesia Checklist: Patient identified, Emergency Drugs available, Suction available and Patient being monitored Patient Re-evaluated:Patient Re-evaluated prior to induction Oxygen Delivery Method: Circle System Utilized Preoxygenation: Pre-oxygenation with 100% oxygen Induction Type: IV induction Ventilation: Mask ventilation without difficulty LMA: LMA with gastric port inserted LMA Size: 4.0 Number of attempts: 1 Placement Confirmation: positive ETCO2 Tube secured with: Tape Dental Injury: Teeth and Oropharynx as per pre-operative assessment

## 2024-01-26 NOTE — Anesthesia Postprocedure Evaluation (Signed)
 Anesthesia Post Note  Patient: [name]  Procedure(s) Performed: WIDE EXCISION, LESION, UPPER EXTREMITY (Right: Arm Lower) APPLICATION, SKIN SUBSTITUTE MYRIAD (Right: Arm Lower)     Patient location during evaluation: PACU Anesthesia Type: General Level of consciousness: awake and alert Pain management: pain level controlled Vital Signs Assessment: post-procedure vital signs reviewed and stable Respiratory status: spontaneous breathing, nonlabored ventilation, respiratory function stable and patient connected to nasal cannula oxygen Cardiovascular status: blood pressure returned to baseline and stable Postop Assessment: no apparent nausea or vomiting Anesthetic complications: no   No notable events documented.  Last Vitals:  Vitals:   01/26/24 1115 01/26/24 1130  BP: (!) 131/40 (!) 141/49  Pulse: 63 68  Resp: 16 12  Temp:  36.6 C  SpO2: 100% 98%    Last Pain:  Vitals:   01/26/24 1130  TempSrc:   PainSc: 0-No pain                 Rome Ade

## 2024-01-26 NOTE — Discharge Instructions (Signed)
Wound Care   Guide to Wound Care  Proper wound care may reduce the risk of infection, improve healing rates, and limit scarring.  This is a general guide to help care for and manage wounds treated with product wound matrix.   Dressing Changes The frequency of dressing changes can vary based on which product was applied, the size of the wound, or the amount of wound drainage. Dressing inspections are recommended, at least weekly.   Place KY gel on the wound daily and cover with gauze.  Dressing Types Primary Dressing:  Non-adherent dressing goes directly over wounds being treated with the powder or sheet.  Secondary Dressing:  Secures the primary dressing in place and provides extra protection, compression, and absorption.  1. Wash Hands - To help decrease the risk of infection, caregivers should wash their hands for a minimum of 20 seconds and may use medical gloves.   2. Remove the Dressings - Avoid removing product from the wound by carefully removing the applicable dressing(s) at the time points recommended above, or as recommended by the treating physician.  Expected Color and Odor:  It is entirely normal for the wound to have an unpleasant odor and to form a caramel-colored gel as the product absorbs into the wound. It is important to leave this gel on the wound site.  3. Clean the Wound - Use clean water or saline to gently rinse around the wound surface and remove any excess discharge that may be present on the wound. Do not wipe off any of the caramel-colored gel on the wound.   What to look out for:  Large or increased amount of drainage   Surrounding skin has worsening redness or hot to touch   Increased pain in or around the wound   Flu-like symptoms, fatigue, decreased appetite, fever   Hard, crusty wound surface with black or brown coloring  4. Apply New Dressings - Dressings should cover the entire wound and be suitable for maintaining a moist wound environment.  The  non-adherent mesh dressing should be left in place.  New dressing should consist of KY Jelly to keep the wound moist and soft gauze secured with a wrap or tape.   Maintain a Hydrated Wound Area It is important to keep the wound area moist throughout the healing process. If the wound appears to be dry during dressing changes, select a dressing that will hydrate the wound and maintain that ideal moist environment. If you are unsure what to do, ask the treating physician.  Remodeling Process Every patient heals differently, and no two cases are the same. The size and location of the wound, product type and layering configurations, and general patient health all contribute to how quickly a wound will heal.  While many factors can influence the rate at which the product absorbs, the following can be used as a general guide.   THINGS TO DO: Refrain from smoking High protein diet with plenty of vegetables and some fruit  Limit simple processed carbohydrates and sugar Protect the wound from trauma Protect the dressing  powder       Sheet            Sorbact dressing

## 2024-01-26 NOTE — Transfer of Care (Signed)
 Immediate Anesthesia Transfer of Care Note  Patient: [name]  Procedure(s) Performed: WIDE EXCISION, LESION, UPPER EXTREMITY (Right: Arm Lower) APPLICATION, SKIN SUBSTITUTE MYRIAD (Right: Arm Lower)  Patient Location: PACU  Anesthesia Type:General  Level of Consciousness: awake and alert   Airway & Oxygen Therapy: Patient Spontanous Breathing and Patient connected to face mask oxygen  Post-op Assessment: Report given to RN and Post -op Vital signs reviewed and stable  Post vital signs: Reviewed and stable  Last Vitals:  Vitals Value Taken Time  BP 130/51 01/26/24 11:00  Temp 36.6 C 01/26/24 11:00  Pulse 70 01/26/24 11:02  Resp 17 01/26/24 11:02  SpO2 99 % 01/26/24 11:02  Vitals shown include unfiled device data.  Last Pain:  Vitals:   01/26/24 0801  TempSrc: Oral         Complications: No notable events documented.

## 2024-01-26 NOTE — H&P (Signed)
 [name] is an 83 y.o. male.   Chief Complaint: right arm lesion HPI: The patient is an 83 yrs old male here for treatment of a right arm lesion.  The area is large and has been getting bigger over the past month.  It is concerning for skin cancer.    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   Cirrhosis of liver (HCC)    Clotting disorder    Colon polyps    CVA (cerebral vascular accident) (HCC) ~ 2000   minor stroke, denies residual on 09/27/2015   DVT (deep venous thrombosis) (HCC) 03/20/2013   RIGHT LEG AND LEFT LEG   DVT (deep venous thrombosis) (HCC) 02/2013   RLE   Esophageal varices (HCC)    Essential hypertension, benign 03/20/2013   History of blood transfusion 1998   blood clot began bleeding   Hyperlipidemia    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   Leukopenia 08/11/2012   Mesenteric venous thrombosis 1996   Mitral regurgitation    Pancytopenia, acquired (HCC) 09/17/2016   Due to non alcoholic cirhosis   Splenomegaly    Type II diabetes mellitus (HCC) dx'd in the 1990s    Past Surgical History:  Procedure Laterality Date   BIOPSY  10/14/2017   Procedure: BIOPSY;  Surgeon: Aneita Gwendlyn DASEN, MD;  Location: [facility];  Service: Endoscopy;;   CATARACT EXTRACTION W/ INTRAOCULAR LENS IMPLANT Right 06/26/2012   CATARACT EXTRACTION W/ INTRAOCULAR LENS IMPLANT Left 07/27/2012   COLONOSCOPY     ESOPHAGOGASTRODUODENOSCOPY (EGD) WITH PROPOFOL  N/A 09/03/2016   Procedure: ESOPHAGOGASTRODUODENOSCOPY (EGD) WITH PROPOFOL ;  Surgeon: Aneita Gwendlyn DASEN, MD;  Location: [facility];  Service: Endoscopy;  Laterality: N/A;   ESOPHAGOGASTRODUODENOSCOPY (EGD) WITH PROPOFOL  N/A 10/14/2017   Procedure: ESOPHAGOGASTRODUODENOSCOPY (EGD) WITH PROPOFOL ;  Surgeon: Aneita Gwendlyn DASEN, MD;  Location: [facility];  Service: Endoscopy;  Laterality: N/A;    Family History  Problem Relation Age of Onset   Heart disease Mother        After age 50   Hypertension Mother    Alzheimer's disease Father    Dementia Father    Cancer Sister        unknown type   Diabetes Sister    Deep vein thrombosis Sister        Varicose vein   Pancreatic cancer Son    Colon cancer Neg Hx    Stomach cancer Neg Hx    Esophageal cancer Neg Hx    Social History:  reports that he has never smoked. He has never used smokeless tobacco. He reports that he does not drink alcohol and does not use drugs.  Allergies:  Allergies  Allergen Reactions   Pravastatin Sodium Other (See Comments)    Medications Prior to Admission  Medication Sig Dispense Refill   aspirin  EC 81 MG tablet Take 81 mg by mouth daily.     atorvastatin  (LIPITOR) 40 MG tablet Take 40 mg by mouth every evening.      calcium  carbonate (TUMS - DOSED IN MG ELEMENTAL CALCIUM ) 500 MG chewable tablet Chew 1 tablet by mouth daily as needed for indigestion or heartburn.     doxycycline  (VIBRAMYCIN ) 100 MG capsule Take 1 capsule (100 mg total) by mouth 2 (two)  times daily. 20 capsule 0   fluticasone  (FLONASE ) 50 MCG/ACT nasal spray Place 1 spray into both nostrils daily. (Patient taking differently: Place 1 spray into both nostrils daily as needed for allergies or rhinitis.) 1 g 0   glimepiride (AMARYL) 2 MG tablet Take 2 mg by mouth daily with breakfast. Take additional 2 mg after the meal and glucose is over 200     linagliptin  (TRADJENTA ) 5 MG TABS tablet Take 5 mg by mouth daily.     losartan  (COZAAR ) 50 MG tablet Take 50 mg by mouth daily with breakfast.     metFORMIN  (GLUCOPHAGE ) 1000 MG tablet Take 1,000 mg by mouth 2 (two) times daily with a meal.     Multiple Vitamins-Minerals (MULTIVITAMIN WITH MINERALS) tablet Take 1 tablet by mouth daily.     ondansetron  (ZOFRAN -ODT) 4 MG disintegrating tablet Take 1 tablet (4 mg  total) by mouth every 8 (eight) hours as needed. 20 tablet 0   furosemide  (LASIX ) 20 MG tablet Take 1 tablet (20 mg total) by mouth daily as needed. AS NEEDED FOR SWELLING 30 tablet 3   meclizine  (ANTIVERT ) 12.5 MG tablet Take 1 tablet (12.5 mg total) by mouth 3 (three) times daily as needed for dizziness. 30 tablet 0   ONETOUCH ULTRA test strip daily.      Results for orders placed or performed during the hospital encounter of 01/26/24 (from the past 48 hours)  Glucose, capillary     Status: Abnormal   Collection Time: 01/26/24  8:03 AM  Result Value Ref Range   Glucose-Capillary 128 (H) 70 - 99 mg/dL    Comment: Glucose reference range applies only to samples taken after fasting for at least 8 hours.   No results found.  Review of Systems  Constitutional: Negative.   Eyes: Negative.   Respiratory: Negative.    Cardiovascular: Negative.   Gastrointestinal: Negative.   Endocrine: Negative.   Genitourinary: Negative.   Skin:  Positive for wound.    Blood pressure 130/82, pulse 68, temperature 97.9 F (36.6 C), temperature source Oral, resp. rate 17, height 5' 10 (1.778 m), weight 73.5 kg, SpO2 99%. Physical Exam Vitals reviewed.  Constitutional:      Appearance: Normal appearance.  HENT:     Head: Atraumatic.  Cardiovascular:     Rate and Rhythm: Normal rate.     Pulses: Normal pulses.  Pulmonary:     Effort: Pulmonary effort is normal.  Musculoskeletal:        General: Swelling and tenderness present.  Skin:    General: Skin is warm.     Capillary Refill: Capillary refill takes less than 2 seconds.     Findings: Lesion present.  Neurological:     Mental Status: He is alert and oriented to person, place, and time.  Psychiatric:        Mood and Affect: Mood normal.        Behavior: Behavior normal.        Thought Content: Thought content normal.        Judgment: Judgment normal.      Assessment/Plan Right arm skin lesion - plan for excision and myriad placement.  Patient is in agreement and understands it will take time to heal.   Estefana RAMAN Lesley Galentine, DO 01/26/2024, 10:06 AM

## 2024-01-26 NOTE — Op Note (Signed)
 DATE OF OPERATION: 01/26/2024  LOCATION: [facility] Main Operating Room  PREOPERATIVE DIAGNOSIS: right arm changing skin lesion 5 x 7 m  POSTOPERATIVE DIAGNOSIS: Same  PROCEDURE: Excision of right arm changing skin lesion 5.5 x 8 cm with myriad placement 5 x 5 cm  SURGEON: Shenika Quint, DO  EBL: none  CONDITION: Stable  COMPLICATIONS: None  INDICATION: The patient, [name], is a 83 y.o. male born on [birth year], is here for treatment of a right arm changing skin lesion.   PROCEDURE DETAILS:  The patient was seen prior to surgery and marked.  The IV antibiotics were given. The patient was taken to the operating room and given a general anesthetic. A standard time out was performed and all information was confirmed by those in the room. SCDs were placed.   The right arm was prepped and draped.  Local was placed around the lesion in the normal skin.  The lesion was marked 5 x 7 cm and the skin was excised 5.5 x 8 cm.  A short stitch was placed proximal and long stitch latera/radial.  All of the myriad was applied and sutured in place with 4-0 Vicryl.  Sorbact was placed and secured with the vicryl. KY gel was applied with gauze and the arm wrapped with kerlex and an Ace wrap.  The patient was allowed to wake up and taken to recovery room in stable condition at the end of the case. The family was notified at the end of the case.

## 2024-01-27 ENCOUNTER — Encounter (HOSPITAL_COMMUNITY): Payer: Self-pay | Admitting: Plastic Surgery

## 2024-01-28 ENCOUNTER — Telehealth: Payer: Self-pay | Admitting: Student

## 2024-01-28 ENCOUNTER — Telehealth: Payer: Self-pay | Admitting: Plastic Surgery

## 2024-01-28 LAB — SURGICAL PATHOLOGY

## 2024-01-28 NOTE — Telephone Encounter (Signed)
 Patients wife called and said patient had surgery 01/26/24 and that she thought patient needed to continue on antibiotics but that nothing had been sent in, She said the pharmacy told her that they tried to contact us  about it but haven't gotten anything back. Patient wife asked for a call back to follow up on this at [phone] or [phone].

## 2024-01-28 NOTE — Telephone Encounter (Signed)
 I called this patient's wife in regards to her call before.  She had called in regards to possibly continuing antibiotics.  She states that her husband was prescribed antibiotics in the emergency room and she was wondering if these needed to be continued.  She reports that her husband has been doing really well.  She states that her husband has not been having any fevers, chills.  I reviewed the patient's chart.  Reviewed Dr. Ace initial consult note and there was no mention of antibiotics or concern for infection.  I also reviewed her operative report and there was no concern for infection mentioned in the note.  I discussed with her that I will check with Dr. Lowery, but at this time he most likely will not need antibiotics based on history and chart review.  She expressed understanding.

## 2024-02-03 ENCOUNTER — Encounter: Payer: Self-pay | Admitting: Plastic Surgery

## 2024-02-03 ENCOUNTER — Ambulatory Visit: Admitting: Plastic Surgery

## 2024-02-03 VITALS — BP 171/69 | HR 67 | Ht 70.0 in | Wt 160.4 lb

## 2024-02-03 DIAGNOSIS — C44622 Squamous cell carcinoma of skin of right upper limb, including shoulder: Secondary | ICD-10-CM | POA: Insufficient documentation

## 2024-02-03 NOTE — Progress Notes (Addendum)
 The patient is an 83 year old male here for follow-up after having a lesion removed from his right arm.  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  The tumor size was 7 cm in maximum diameter.  There was surgical margin excision negative for carcinoma close margin at a half a centimeter from deep soft tissue margin.  Patient doing much better.  The green dressing is in place and no sign of infection.  Continue with KY dressing changes daily and will try and get some supplies sent to the house.  He will follow-up in the next week and then I should see him about 2 weeks later.  We can talk about whether or not he wants to let it heal or do a skin graft. The defect was 5 x 8 x 2 cm.

## 2024-02-17 NOTE — Progress Notes (Unsigned)
 Patient is a pleasant 83 year old male s/p excision invasive SCC with placement of myriad tissue matrix performed 01/26/2024 by Dr. Lowery who presents to clinic for postoperative follow up.    He was seen most recently here in clinic on 02/03/2024.  At that time, exam was reassuring and Sorbact was left in place.  Recommended continued K-Y jelly dressing changes.    Today, patient is doing well from a postoperative standpoint.  His wife is accompanying him at bedside and has been the one performing dressing changes.  She states that they did receive a box of dressing supplies including 4 x 4 gauze and roll gauze, but reports that they are running low on K-Y gel and Vashe wound cleanser.  On exam, Sorbact is carefully removed without complication.  Patient is taking ASA 81 mg, but no significant bleeding noted with removal.  Wound measures 5 x 3.5 cm, approximately 0.25 cm depth.  There is still some residual unincorporated myriad/tissue slough, but there appears to be some healthy underlying granulation.  Surrounding skin and tissue appears healthy and without irritation.  Recommending Adaptic followed by K-Y jelly, Vashe moistened gauze, and secured with Kerlix.  Change daily.  Emphasized the importance of keeping wound moist at time of dressing changes to help mitigate bleeding.  At the conclusion of the visit, patient pointed out a small skin tear to dorsum of left hand, ulnar aspect.  He reports this occurred a couple days ago.  No bony tenderness noted.  Appeared clean.  Placed bordered Mepilex dressing.  Follow-up as scheduled, sooner if needed.  Picture(s) obtained of the patient and placed in the chart were with the patient's or guardian's permission.

## 2024-02-18 ENCOUNTER — Ambulatory Visit: Admitting: Physician Assistant

## 2024-02-18 VITALS — BP 165/81 | HR 75

## 2024-02-18 DIAGNOSIS — C44622 Squamous cell carcinoma of skin of right upper limb, including shoulder: Secondary | ICD-10-CM

## 2024-02-26 ENCOUNTER — Ambulatory Visit
Admission: RE | Admit: 2024-02-26 | Discharge: 2024-02-26 | Disposition: A | Discharge status: Home / Self Care | Source: Ambulatory Visit | Attending: Nurse Practitioner | Admitting: Nurse Practitioner

## 2024-02-26 DIAGNOSIS — K7469 Other cirrhosis of liver: Secondary | ICD-10-CM

## 2024-03-05 ENCOUNTER — Ambulatory Visit: Admitting: Plastic Surgery

## 2024-03-05 DIAGNOSIS — Z85828 Personal history of other malignant neoplasm of skin: Secondary | ICD-10-CM | POA: Diagnosis not present

## 2024-03-05 DIAGNOSIS — C44622 Squamous cell carcinoma of skin of right upper limb, including shoulder: Secondary | ICD-10-CM

## 2024-03-05 DIAGNOSIS — S41101D Unspecified open wound of right upper arm, subsequent encounter: Secondary | ICD-10-CM

## 2024-03-05 NOTE — Progress Notes (Signed)
" ° °  Subjective:    Patient ID: [name], male    DOB: [birth year], 84 y.o.   MRN: [id number]  The patient is an 84 year old male here for follow-up after undergoing excision of a large skin cancer on his right arm.  He has been using Vaseline dressing daily.  The area is looking extremely good.  It is about 1-1/2 x 2 cm in size.  No sign of infection.  His overall arm looks much better than it did on his last visit.  It has filled in really nicely.  They do not particularly want to do a skin graft.      Review of Systems  Constitutional: Negative.   Eyes: Negative.   Respiratory: Negative.    Cardiovascular: Negative.   Gastrointestinal: Negative.        Objective:   Physical Exam Constitutional:      Appearance: Normal appearance.  Cardiovascular:     Rate and Rhythm: Normal rate.     Pulses: Normal pulses.  Skin:    General: Skin is warm.     Capillary Refill: Capillary refill takes less than 2 seconds.     Coloration: Skin is not jaundiced.     Findings: Lesion present.  Neurological:     Mental Status: He is alert.  Psychiatric:        Mood and Affect: Mood normal.        Behavior: Behavior normal.           Assessment & Plan:     ICD-10-CM   1. Squamous cell carcinoma of arm, right  C44.622        I was able to place some donated calcium  alginate on the wound.  They can use this every other day and we will plan to see them back in 2 weeks.  Pictures were obtained of the patient and placed in the chart with the patient's or guardian's permission.  "

## 2024-03-18 NOTE — Progress Notes (Signed)
 Patient is an 85 year old male who recently underwent excision of right arm changing skin lesion with Dr. Lowery on 01/26/2024.  Patient is about 7 weeks postop.  He presents to the clinic today for postoperative follow-up.  Per chart review, pathology showed that the lesion was an invasive squamous cell carcinoma, well to moderately differentiated.  Surgical margins of the excision were negative for carcinoma.  Patient was last seen in the clinic on 03/05/2024.  At this visit, patient had been using Vaseline dressings daily.  The area looked extremely good.  It was about 1.5 x 2 cm in size.  There is no sign of infection.  Patient did not express that he wanted to pursue skin graft over the wound.  Calcium  alginate was placed over the patient's wound.  Today, patient reports with his wife at bedside.  He states that he is doing well.  He reports some irritation to the skin around the wound.  He denies any issues or concerns with the wound.  He denies any fevers or chills.  On exam, patient is sitting upright in no acute distress.  Wound to the right arm is approximately 2.25 x 1.25 cm.  There is a little bit of slough noted in the wound, otherwise granulation tissue is noted.  There were several Vicryl sutures that were noted to the skin around the wound, these were cut and removed.  Patient tolerated well.  Skin does appear to be mildly irritated around the wound.  There does not appear to be any sign of infection on exam.  Recommended to the patient and his wife that they continue to clean the wound with Vashe and apply Urgo clean dressing to the wound every other day.  They may cover this with a Mepilex border dressing or they can cover the Urgo clean with gauze and wrap with Kerlix.  Recommended he avoid adhesives.  Patient and his wife expressed understanding.  Encouraged patient to continue to follow-up with his dermatologist.  Patient to follow back up in a few weeks.  Instructed him and  his wife to call with any questions or concerns.  Pictures were obtained of the patient and placed in the chart with the patient's or guardian's permission.

## 2024-03-19 ENCOUNTER — Encounter: Payer: Self-pay | Admitting: Student

## 2024-03-19 ENCOUNTER — Ambulatory Visit: Admitting: Student

## 2024-03-19 VITALS — BP 164/90 | HR 65 | Ht 70.0 in | Wt 166.6 lb

## 2024-03-19 DIAGNOSIS — Z85828 Personal history of other malignant neoplasm of skin: Secondary | ICD-10-CM

## 2024-03-19 DIAGNOSIS — S41101D Unspecified open wound of right upper arm, subsequent encounter: Secondary | ICD-10-CM | POA: Diagnosis not present

## 2024-03-19 DIAGNOSIS — C44622 Squamous cell carcinoma of skin of right upper limb, including shoulder: Secondary | ICD-10-CM

## 2024-03-25 NOTE — Addendum Note (Signed)
 Addended by: ANDRIS STAGGER on: 03/25/2024 10:21 AM   Modules accepted: Level of Service

## 2024-04-09 ENCOUNTER — Encounter: Admitting: Student

## 2024-04-13 ENCOUNTER — Ambulatory Visit: Admitting: Internal Medicine

## 2024-04-13 ENCOUNTER — Other Ambulatory Visit

## 2024-06-30 ENCOUNTER — Ambulatory Visit (HOSPITAL_COMMUNITY)

## 2024-06-30 ENCOUNTER — Ambulatory Visit
